# Patient Record
Sex: Female | Born: 1985
Health system: Southern US, Community
[De-identification: ages and names within clinical notes are randomized; demographics above are authoritative.]

## PROBLEM LIST (undated history)

## (undated) ENCOUNTER — Ambulatory Visit (HOSPITAL_COMMUNITY): Admission: EM | Payer: Self-pay | Source: Home / Self Care

## (undated) DIAGNOSIS — F32A Depression, unspecified: Secondary | ICD-10-CM

## (undated) DIAGNOSIS — F329 Major depressive disorder, single episode, unspecified: Secondary | ICD-10-CM

## (undated) HISTORY — DX: Depression, unspecified: F32.A

## (undated) HISTORY — DX: Major depressive disorder, single episode, unspecified: F32.9

## (undated) HISTORY — PX: WISDOM TOOTH EXTRACTION: SHX21

---
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
## 2005-04-02 ENCOUNTER — Emergency Department (HOSPITAL_COMMUNITY): Admission: EM | Admit: 2005-04-02 | Discharge: 2005-04-02 | Payer: Self-pay | Admitting: Emergency Medicine

## 2007-02-08 ENCOUNTER — Emergency Department (HOSPITAL_COMMUNITY): Admission: EM | Admit: 2007-02-08 | Discharge: 2007-02-08 | Payer: Self-pay | Admitting: Emergency Medicine

## 2008-01-09 ENCOUNTER — Encounter: Admission: RE | Admit: 2008-01-09 | Discharge: 2008-01-09 | Payer: Self-pay | Admitting: Obstetrics and Gynecology

## 2008-07-11 ENCOUNTER — Inpatient Hospital Stay (HOSPITAL_COMMUNITY): Admission: AD | Admit: 2008-07-11 | Discharge: 2008-07-12 | Payer: Self-pay | Admitting: Obstetrics and Gynecology

## 2008-07-13 ENCOUNTER — Encounter: Admission: RE | Admit: 2008-07-13 | Discharge: 2008-07-13 | Payer: Self-pay | Admitting: Obstetrics and Gynecology

## 2008-07-18 ENCOUNTER — Inpatient Hospital Stay (HOSPITAL_COMMUNITY): Admission: AD | Admit: 2008-07-18 | Discharge: 2008-07-18 | Payer: Self-pay | Admitting: Obstetrics and Gynecology

## 2008-08-10 ENCOUNTER — Inpatient Hospital Stay (HOSPITAL_COMMUNITY): Admission: RE | Admit: 2008-08-10 | Discharge: 2008-08-12 | Payer: Self-pay | Admitting: Obstetrics and Gynecology

## 2009-07-23 ENCOUNTER — Emergency Department (HOSPITAL_COMMUNITY): Admission: EM | Admit: 2009-07-23 | Discharge: 2009-07-23 | Payer: Self-pay | Admitting: Emergency Medicine

## 2009-09-05 ENCOUNTER — Emergency Department (HOSPITAL_COMMUNITY): Admission: EM | Admit: 2009-09-05 | Discharge: 2009-09-05 | Payer: Self-pay | Admitting: Emergency Medicine

## 2009-09-07 ENCOUNTER — Ambulatory Visit: Payer: Self-pay | Admitting: Family Medicine

## 2009-09-07 ENCOUNTER — Inpatient Hospital Stay (HOSPITAL_COMMUNITY): Admission: EM | Admit: 2009-09-07 | Discharge: 2009-09-09 | Payer: Self-pay | Admitting: Emergency Medicine

## 2009-09-14 ENCOUNTER — Ambulatory Visit: Payer: Self-pay | Admitting: Family Medicine

## 2009-09-14 DIAGNOSIS — E1065 Type 1 diabetes mellitus with hyperglycemia: Secondary | ICD-10-CM | POA: Insufficient documentation

## 2009-09-14 DIAGNOSIS — IMO0002 Reserved for concepts with insufficient information to code with codable children: Secondary | ICD-10-CM | POA: Insufficient documentation

## 2009-09-19 ENCOUNTER — Encounter: Payer: Self-pay | Admitting: Family Medicine

## 2009-10-04 ENCOUNTER — Ambulatory Visit: Payer: Self-pay | Admitting: Family Medicine

## 2010-03-28 NOTE — Assessment & Plan Note (Signed)
Summary: see message/ts  called pt and lmvm to call back. pt needs appt with j.sykes and not w/dr.Brylee Berk (unless pt has problems today) Arlyss Repress CMA,  September 19, 2009 11:44 AM advised pt to see  dr.sykes and f/up with dr.Yuritza Paulhus in 2-4 weeks. pt agreed. we will cancel the appt for today.Arlyss Repress CMA,  September 19, 2009 1:38 PM

## 2010-03-28 NOTE — Assessment & Plan Note (Signed)
Summary: Seeing Sykes @ 10:30 / JCS   Vital Signs:  Patient profile:   25 year old female Height:      63 inches Weight:      114.3 pounds BMI:     20.32  Vitals Entered By: Wyona Almas PHD (October 04, 2009 10:31 AM)  Primary Care Provider:  Helane Rima DO   History of Present Illness: Assessment:  Spent 60 minutes with pt.  Usual eating pattern includes 3 meals and 1-2 snacks.  She has a 1- and 7-YO at home, is a Physicist, medical Probation officer), and works at [employer] on weekends,  ~14 hr.  Everyday foods/beverages include Crystal Light, coffee, eggs & bacon/sausage, toast w/ sugar-free jelly, lunch deli meat sandwich.  FBG has been running 100-120; both pre-lunch & pre-dinner have been 80-100.  24-hr recall suggests intake of 1300 kcal: B (9 AM)- 2 scrmbld eggs w/ 1 slc Amer cheese, 2 pc wht white toast w/ sug-free jelly, coffee w/ creamer & 4 SplendasL (12 PM)- [place] & pepper jack chs sandwich w/  ~1 tsp mayo, Crystal Light; Snk (3 PM)- 100-kcal Keebler choc pretzel (16 g CHO), 1/2 caramel nut bar (8 g CHO); D (7 PM)- 1/2 chx (3-4 oz) enchilada w/ sourcrm, cheese, tortilla, Cr Light.  Keniyah has had 2 significant hypoglycemic events early after dx, but has had none in the past couple of weeks.  She currently taking 17 U of Lantus in the morning only.  Karsynn stays busy, but does not do any structured exercise, other than occasional walks at night with her mother-in-law.  Although Letetia said she ate more liberally last week while on vacation, I think she has been undereating b/c of fear of running her BG up.    Nutrition Diagnosis:  Inadequate energy intake (NI-1.4) related to Dm anxiety as evidenced by intake of only 1300 kcal yesterday.  Physical inactivity (NB-2.1) related to time constraints as evidenced by no regular exercise.  Inadequate fruits and veg's.    Intervention:  See Patient Instructions.    Monitoring/Eval:  Dietary intake, body weight, and exercise in  Dr. Philis Pique nutr clinic in Sept.     Allergies: No Known Drug Allergies   Complete Medication List: 1)  Lantus Solostar 100 Unit/ml Soln (Insulin glargine) .... Use as directed 2)  Novolog 100 Unit/ml Soln (Insulin aspart) .... Use as directed. 3)  Mirena 20 Mcg/24hr Iud (Levonorgestrel)  Other Orders: Inital Assessment Each - [facility] [phone])  Patient Instructions: 1)  Nov 4th is Diabetes Day at [facility], sponsored by [employer].   2)  Look into classes at [facility]:  [phone]. 3)  TASTE PREFERENCES ARE LEARNED.   4)  PLANNING AHEAD WILL BE THE DIFFERENCE BETWEEN EATING WELL OR NOT:  Make a list of 5-7 meals that are nutritious, taste good, and are relatively quick & easy.  Use this as a basis for shopping, and keep it readily accessible.   5)  Incorporate more vegetables to lunch and dinner.  Nonstarchy veg's include all except for corn, potatoes, peas, dried beans and peas.  Sweet potatoes are starch foods, but do not raise BG as much as white potatoes.   6)  Limit carbohydrate to 48-50 grams per meal and 15-30 for snacks.   7)  Fiber does not count toward your carb's.   8)  Snacks:  Try to include a source of protein as well.   9)  Fruit will  be fine in normal portion sizes.  You may want to check your BG response to some fruits, i.e., oranges, pineapple, banana, watermelon.   10)  Please schedule an appt in Dr. Philis Pique Thursday afternoon Nutrition Clinic in Sept.   11)  If you have Qs, call Dr. Gerilyn Pilgrim:  [phone].

## 2010-03-28 NOTE — Assessment & Plan Note (Signed)
Summary: hfu/eo   Vital Signs:  Patient profile:   25 year old female Height:      63 inches Weight:      115 pounds BMI:     20.44 Temp:     98.4 degrees F oral Pulse rate:   84 / minute BP sitting:   101 / 73  (right arm) Cuff size:   regular  Vitals Entered By: Jimmy Footman, CMA (September 14, 2009 10:49 AM)  CC: F/U hospital for type I diabetes Is Patient Diabetic? Yes Did you bring your meter with you today? No Pain Assessment Patient in pain? no        Primary Care Provider:  Helane Rima DO  CC:  F/U hospital for type I diabetes.  History of Present Illness: 25 yo F with new onset DM, insulin dependent. Here for hospital follow-up.   1. DM: Rx Lantus (up to 14 units in the pm), Novolog SSI. Average am BG 150s-160s, average mealtime coverage 3-4 units. Highest BG = 246. Lowest = 91 (shaking at that time). Eye exam < 1 year ago. A1c 10.5. Exercises regularly. States that she is hungry "all the time." Denies HA, dizziness, vision changes, CP, SOB, N/V/D, numbness/tingling in hands/feet.  Habits & Providers  Alcohol-Tobacco-Diet     Tobacco Status: current  Current Medications (verified): 1)  Lantus Solostar 100 Unit/ml  Soln (Insulin Glargine) .... Use As Directed 2)  Novolog 100 Unit/ml Soln (Insulin Aspart) .... Use As Directed. 3)  Mirena 20 Mcg/24hr Iud (Levonorgestrel)  Allergies (verified): No Known Drug Allergies  Past History:  Past Medical History: DM, Insulin-Req Migraines  Past Surgical History: Caesarean section  Family History: Both grandfathers have diabetes type 2.  A grandmother with kidney disease, otherwise negative.   Social History: She is a smoker.  No drug use.  Occasional alcohol useSmoking Status:  current  Review of Systems General:  Denies chills and fever. CV:  Denies chest pain or discomfort, palpitations, and shortness of breath with exertion. GI:  Denies change in bowel habits, nausea, and vomiting. Endo:  Complains of  excessive hunger; denies excessive thirst and excessive urination.  Physical Exam  General:  Well-developed, well-nourished ,in no acute distress; alert, appropriate and cooperative throughout examination. Vitals reviewed. Lungs:  Normal respiratory effort, chest expands symmetrically. Lungs are clear to auscultation, no crackles or wheezes. Heart:  Normal rate and regular rhythm. S1 and S2 normal without gallop, murmur, click, rub or other extra sounds. Pulses:  2 + DP. Extremities:  No edema. Psych:  Oriented X3, memory intact for recent and remote, normally interactive, good eye contact, not anxious appearing, and not depressed appearing.     Impression & Recommendations:  Problem # 1:  DIABETES MELLITUS, TYPE I (ICD-250.01) Assessment New Will refer this patient to Endocrine since she is a good candidate for an insulin pump. Discussed self-titration of Lantus for am BG. Will send to Dr. Gerilyn Pilgrim for nutrition education. Her updated medication list for this problem includes:    Lantus Solostar 100 Unit/ml Soln (Insulin glargine) ..... Use as directed    Novolog 100 Unit/ml Soln (Insulin aspart) ..... Use as directed.  Orders: Endocrinology Referral (Endocrine)  Complete Medication List: 1)  Lantus Solostar 100 Unit/ml Soln (Insulin glargine) .... Use as directed 2)  Novolog 100 Unit/ml Soln (Insulin aspart) .... Use as directed. 3)  Mirena 20 Mcg/24hr Iud (Levonorgestrel)  Patient Instructions: 1)  It was very nice to meet you today! 2)  I am referring  you to our nutritionist and to an endocrinologist. 3)  Follow up in 2-4 weeks. 4)  Increase your Lantus 1 units each morning until your am blood sugar is less than 120. Prescriptions: LANTUS SOLOSTAR 100 UNIT/ML  SOLN (INSULIN GLARGINE) use as directed  #3 boxes x 3   Entered and Authorized by:   Helane Rima DO   Signed by:   Helane Rima DO on 09/21/2009   Method used:   Historical   RxID:   [id number]

## 2010-04-04 ENCOUNTER — Encounter: Payer: Self-pay | Admitting: *Deleted

## 2010-05-13 LAB — GLUCOSE, CAPILLARY

## 2010-05-14 LAB — POCT I-STAT, CHEM 8
Calcium, Ion: 1.06 mmol/L — ABNORMAL LOW (ref 1.12–1.32)
Creatinine, Ser: 0.6 mg/dL (ref 0.4–1.2)
Glucose, Bld: 345 mg/dL — ABNORMAL HIGH (ref 70–99)
HCT: 51 % — ABNORMAL HIGH (ref 36.0–46.0)
Hemoglobin: 17.3 g/dL — ABNORMAL HIGH (ref 12.0–15.0)
Sodium: 132 mEq/L — ABNORMAL LOW (ref 135–145)

## 2010-05-14 LAB — URINE MICROSCOPIC-ADD ON

## 2010-05-14 LAB — DIFFERENTIAL
Basophils Absolute: 0.1 10*3/uL (ref 0.0–0.1)
Eosinophils Absolute: 0.2 10*3/uL (ref 0.0–0.7)
Lymphocytes Relative: 24 % (ref 12–46)
Lymphs Abs: 3 10*3/uL (ref 0.7–4.0)
Monocytes Absolute: 0.6 10*3/uL (ref 0.1–1.0)
Monocytes Relative: 5 % (ref 3–12)
Neutrophils Relative %: 69 % (ref 43–77)

## 2010-05-14 LAB — CBC
HCT: 47.8 % — ABNORMAL HIGH (ref 36.0–46.0)
MCH: 30.7 pg (ref 26.0–34.0)
MCV: 89.3 fL (ref 78.0–100.0)
RDW: 12.4 % (ref 11.5–15.5)
WBC: 12.8 10*3/uL — ABNORMAL HIGH (ref 4.0–10.5)

## 2010-05-14 LAB — BASIC METABOLIC PANEL
BUN: 5 mg/dL — ABNORMAL LOW (ref 6–23)
Creatinine, Ser: 0.53 mg/dL (ref 0.4–1.2)
GFR calc Af Amer: >60 mL/min (ref >60)
Potassium: 3.7 mEq/L (ref 3.5–5.1)
Sodium: 137 mEq/L (ref 135–145)

## 2010-05-14 LAB — POCT I-STAT 3, VENOUS BLOOD GAS (G3P V)
Bicarbonate: 21.9 mEq/L (ref 20.0–24.0)
TCO2: 23 mmol/L (ref 0–100)
pH, Ven: 7.405 — ABNORMAL HIGH (ref 7.250–7.300)
pO2, Ven: 17 mmHg — CL (ref 30.0–45.0)

## 2010-05-14 LAB — GLUCOSE, CAPILLARY
Glucose-Capillary: 184 mg/dL — ABNORMAL HIGH (ref 70–99)
Glucose-Capillary: 201 mg/dL — ABNORMAL HIGH (ref 70–99)
Glucose-Capillary: 257 mg/dL — ABNORMAL HIGH (ref 70–99)
Glucose-Capillary: 260 mg/dL — ABNORMAL HIGH (ref 70–99)
Glucose-Capillary: 260 mg/dL — ABNORMAL HIGH (ref 70–99)
Glucose-Capillary: 297 mg/dL — ABNORMAL HIGH (ref 70–99)
Glucose-Capillary: 400 mg/dL — ABNORMAL HIGH (ref 70–99)
Glucose-Capillary: 415 mg/dL — ABNORMAL HIGH (ref 70–99)

## 2010-05-14 LAB — GLIADIN ANTIBODIES, SERUM
Gliadin IgA: 2.7 U/mL (ref <20)
Gliadin IgG: 2.6 U/mL (ref <20)

## 2010-05-14 LAB — URINALYSIS, ROUTINE W REFLEX MICROSCOPIC
Ketones, ur: >80 mg/dL — AB
Leukocytes, UA: NEGATIVE
Urobilinogen, UA: 0.2 mg/dL (ref 0.0–1.0)

## 2010-05-14 LAB — COMPREHENSIVE METABOLIC PANEL
Albumin: 5 g/dL (ref 3.5–5.2)
Chloride: 94 mEq/L — ABNORMAL LOW (ref 96–112)
Creatinine, Ser: 0.71 mg/dL (ref 0.4–1.2)
GFR calc Af Amer: >60 mL/min (ref >60)
GFR calc non Af Amer: >60 mL/min (ref >60)
Total Bilirubin: 1 mg/dL (ref 0.3–1.2)

## 2010-05-14 LAB — POCT PREGNANCY, URINE: Preg Test, Ur: NEGATIVE

## 2010-05-14 LAB — KETONES, QUALITATIVE

## 2010-06-05 LAB — CBC
HCT: 33.8 % — ABNORMAL LOW (ref 36.0–46.0)
MCHC: 35.2 g/dL (ref 30.0–36.0)
MCHC: 35.1 g/dL (ref 30.0–36.0)
Platelets: 288 10*3/uL (ref 150–400)
Platelets: 303 10*3/uL (ref 150–400)
RDW: 13.4 % (ref 11.5–15.5)
RDW: 13.6 % (ref 11.5–15.5)

## 2010-06-05 LAB — TYPE AND SCREEN: Antibody Screen: NEGATIVE

## 2010-06-05 LAB — ABO/RH: ABO/RH(D): O POS

## 2010-07-05 ENCOUNTER — Emergency Department (HOSPITAL_COMMUNITY)
Admission: EM | Admit: 2010-07-05 | Discharge: 2010-07-06 | Disposition: A | Discharge status: Home / Self Care | Payer: Medicaid Other | Attending: Emergency Medicine | Admitting: Emergency Medicine

## 2010-07-05 ENCOUNTER — Emergency Department (HOSPITAL_COMMUNITY): Payer: Medicaid Other

## 2010-07-05 DIAGNOSIS — E119 Type 2 diabetes mellitus without complications: Secondary | ICD-10-CM | POA: Insufficient documentation

## 2010-07-05 DIAGNOSIS — R109 Unspecified abdominal pain: Secondary | ICD-10-CM | POA: Insufficient documentation

## 2010-07-05 DIAGNOSIS — R10815 Periumbilic abdominal tenderness: Secondary | ICD-10-CM | POA: Insufficient documentation

## 2010-07-05 DIAGNOSIS — Z794 Long term (current) use of insulin: Secondary | ICD-10-CM | POA: Insufficient documentation

## 2010-07-05 DIAGNOSIS — R112 Nausea with vomiting, unspecified: Secondary | ICD-10-CM | POA: Insufficient documentation

## 2010-07-05 LAB — BASIC METABOLIC PANEL
BUN: 10 mg/dL (ref 6–23)
Calcium: 9.2 mg/dL (ref 8.4–10.5)
Chloride: 103 mEq/L (ref 96–112)
Creatinine, Ser: 0.51 mg/dL (ref 0.4–1.2)
GFR calc Af Amer: >60 mL/min (ref >60)
GFR calc non Af Amer: >60 mL/min (ref >60)

## 2010-07-05 LAB — POCT PREGNANCY, URINE: Preg Test, Ur: NEGATIVE

## 2010-07-05 LAB — URINALYSIS, ROUTINE W REFLEX MICROSCOPIC
Bilirubin Urine: NEGATIVE
Ketones, ur: NEGATIVE mg/dL
Nitrite: NEGATIVE
Protein, ur: NEGATIVE mg/dL
Urobilinogen, UA: 0.2 mg/dL (ref 0.0–1.0)
pH: 6 (ref 5.0–8.0)

## 2010-07-05 LAB — WET PREP, GENITAL
Clue Cells Wet Prep HPF POC: NONE SEEN
Trich, Wet Prep: NONE SEEN
Yeast Wet Prep HPF POC: NONE SEEN

## 2010-07-05 LAB — CBC
MCH: 30.7 pg (ref 26.0–34.0)
MCV: 86.7 fL (ref 78.0–100.0)
Platelets: 263 10*3/uL (ref 150–400)
RBC: 4.6 MIL/uL (ref 3.87–5.11)
RDW: 12.4 % (ref 11.5–15.5)

## 2010-07-05 LAB — DIFFERENTIAL
Basophils Relative: 0 % (ref 0–1)
Eosinophils Absolute: 0.3 10*3/uL (ref 0.0–0.7)
Eosinophils Relative: 2 % (ref 0–5)
Monocytes Relative: 6 % (ref 3–12)
Neutrophils Relative %: 79 % — ABNORMAL HIGH (ref 43–77)

## 2010-07-05 LAB — URINE MICROSCOPIC-ADD ON

## 2010-07-05 LAB — LIPASE, BLOOD: Lipase: 26 U/L (ref 11–59)

## 2010-07-06 ENCOUNTER — Emergency Department (HOSPITAL_COMMUNITY): Payer: Medicaid Other

## 2010-07-06 LAB — GC/CHLAMYDIA PROBE AMP, GENITAL
Chlamydia, DNA Probe: NEGATIVE
GC Probe Amp, Genital: NEGATIVE

## 2010-07-06 MED ORDER — IOHEXOL 300 MG/ML  SOLN
100.0000 mL | Freq: Once | INTRAMUSCULAR | Status: AC | PRN
  Administered 2010-07-06: 100 mL via INTRAVENOUS

## 2010-07-11 NOTE — Op Note (Signed)
Sierra Schneider, Sierra Schneider              ACCOUNT NO.:  [id number]   MEDICAL RECORD NO.:  [id number]          PATIENT TYPE:  INP   LOCATION:  9127                          FACILITY:  [facility]   PHYSICIAN:  Zelphia Cairo, MD    DATE OF BIRTH:  21-Sep-1985   DATE OF PROCEDURE:  08/10/2008  DATE OF DISCHARGE:                               OPERATIVE REPORT   PREOPERATIVE DIAGNOSES:  1. Intrauterine pregnancy at term.  2. Suspected macrosomia.  3. Post date.  4. Desires primary elective cesarean section.   PROCEDURE:  Primary low transverse cesarean delivery.   SURGEON:  Zelphia Cairo, MD   ANESTHESIA:  Spinal.   FINDINGS:  Viable infant with Apgars of 5 and 9, weight 9 pounds 3  ounces, pH 7.31, normal-appearing pelvic anatomy.   SPECIMENS:  Placenta.   COMPLICATIONS:  None.   CONDITION:  Stable to recovery room.   PROCEDURE:  Sierra Schneider was taken to the operating room where spinal  anesthesia was found to be adequate.  She was placed in the supine  position with a left tilt.  She was prepped and draped in sterile  fashion and a Foley catheter was inserted sterilely.  Pfannenstiel skin  incision was made with a scalpel and carried down to the underlying  fascia.  The fascia was incised in the midline.  This was extended  laterally using Mayo scissors.  Kocher clamps were used to grasp the  superior and inferior portion of the fascia.  The underlying rectus  muscles were dissected off using the Bovie.  The peritoneum was then  identified and entered sharply.  This was extended superiorly and  inferiorly with good visualization of the bladder.  The bladder blade  was then inserted.  The vesicouterine peritoneum was dissected off the  lower uterine segment using Metzenbaum scissors and blunt dissection.  The bladder blade was reinserted to protect the bladder flap.   Uterine incision was made with the scalpel and extended bluntly using my  fingers.  The fetal vertex was brought to the  uterine incision and  delivered with fundal pressure.  The mouth and nose were suctioned.  Thus, the cord was clamped and cut, and the infant was taken to the  awaiting pediatric staff.  The placenta was then manually removed from  the uterus.  The uterus was cleared of all clots and debris using a dry  lap sponge.  Uterine incision was reapproximated in a double layer  closure using 0 chromic in a running locked fashion.  Once hemostasis  was assured, the pelvis was then copiously irrigated with warm normal  saline.  The uterine incision was reinspected and  again found to be hemostatic.  The peritoneum was reapproximated using 0  Monocryl.  The fascia was closed with a looped 0 PDS and the skin was  closed with staples.  Sponge, lap, needle, and instrument counts were  correct x2.  She was taken to the recovery room in stable condition.      Zelphia Cairo, MD  Electronically Signed     GA/MEDQ  D:  08/10/2008  T:  08/11/2008  Job:  [id number]

## 2010-07-14 NOTE — Discharge Summary (Signed)
NAMEWINDI, Sierra Schneider              ACCOUNT NO.:  [id number]   MEDICAL RECORD NO.:  [id number]          PATIENT TYPE:  INP   LOCATION:  9127                          FACILITY:  [facility]   PHYSICIAN:  Duke Salvia. Marcelle Overlie, M.D.DATE OF BIRTH:  Mar 23, 1985   DATE OF ADMISSION:  08/10/2008  DATE OF DISCHARGE:  08/12/2008                               DISCHARGE SUMMARY   ADMITTING DIAGNOSES:  1. Intrauterine pregnancy at 46 and 5/7th weeks' estimated gestational      age.  2. Suspect macrosomia.  3. The patient desire for primary elective cesarean delivery.   DISCHARGE DIAGNOSES:  1. Status post low transverse cesarean section.  2. Viable female infant.   PROCEDURE:  Primary low transverse cesarean section.   REASON FOR ADMISSION:  Please see written H and P.   HOSPITAL COURSE:  The patient is a 25 year old, gravida 2, para 1, that  was admitted to [facility] for scheduled cesarean  section.  The patient had known suspect macrosomia of the infant and  desired a primary cesarean delivery.  On the morning of this admission,  the patient was taken to the operating room, where a spinal anesthesia  was administered without difficulty.  A low transverse incision was made  with delivery of a viable female infant weighing 9 pounds 3 ounces with  Apgars of 5 at 1-minute and 9 at 5 minutes.  The patient the tolerated  procedure well and was taken to the recovery room in stable condition.  On postoperative day #1, the patient was without complaint.  Vital signs  were stable.  She was afebrile.  Abdomen was soft.  Abdominal dressings  noted be clean, dry, and intact.  On postoperative day #2, the patient  did desire early discharge.  Vital signs were stable.  She was afebrile.  Abdomen was soft.  Fundus was firm and nontender.  Incision was clean,  dry, and intact.  Discharge instructions reviewed and the patient was  later discharged home.   CONDITION ON DISCHARGE:  Stable.   DIET:   Regular as tolerated.   ACTIVITY:  No heavy lifting, no driving x2 weeks, and no vaginal entry.   FOLLOWUP:  The patient is to follow up in the office in 2-3 days for  staple removal.  She is to call for temperature greater than 100  degrees, persistent nausea, vomiting, heavy vaginal bleeding and/or  redness, or drainage from the incisional site.   DISCHARGE MEDICATIONS:  1. Tylox #30 one p.o. q.4-6 h. p.r.n.  2. Motrin 600 mg q.6 h.  3. Prenatal vitamins 1 p.o. daily.  4. Colace 1 p.o. daily p.r.n.      Julio Sicks, N.P.      Richard M. Marcelle Overlie, M.D.  Electronically Signed    CC/MEDQ  D:  08/19/2008  T:  08/19/2008  Job:  [id number]

## 2010-08-04 ENCOUNTER — Telehealth: Payer: Self-pay | Admitting: *Deleted

## 2010-08-04 NOTE — Telephone Encounter (Signed)
Patient walked into clinic with c/o sinus pain and pressure.  When I pulled up her vists I saw that she has only been seen once in our clinic and that was for a HFU almost a year ago.  Advised her that we would not be able to see her and that she would need to go to an [facility].  Advised pt to schedule an appointment to re-establish care.  Pt agreeable.

## 2010-08-11 ENCOUNTER — Ambulatory Visit: Payer: Medicaid Other | Admitting: Family Medicine

## 2010-11-27 ENCOUNTER — Encounter: Payer: Self-pay | Admitting: Family Medicine

## 2010-11-27 ENCOUNTER — Ambulatory Visit (INDEPENDENT_AMBULATORY_CARE_PROVIDER_SITE_OTHER): Payer: Medicaid Other | Admitting: Family Medicine

## 2010-11-27 DIAGNOSIS — Z7189 Other specified counseling: Secondary | ICD-10-CM

## 2010-11-27 DIAGNOSIS — F32A Depression, unspecified: Secondary | ICD-10-CM

## 2010-11-27 DIAGNOSIS — E109 Type 1 diabetes mellitus without complications: Secondary | ICD-10-CM

## 2010-11-27 DIAGNOSIS — F3289 Other specified depressive episodes: Secondary | ICD-10-CM

## 2010-11-27 DIAGNOSIS — F329 Major depressive disorder, single episode, unspecified: Secondary | ICD-10-CM

## 2010-11-27 DIAGNOSIS — Z719 Counseling, unspecified: Secondary | ICD-10-CM

## 2010-11-27 MED ORDER — SERTRALINE HCL 25 MG PO TABS: 50.0000 mg | ORAL_TABLET | Freq: Every day | ORAL | Status: DC

## 2010-11-27 NOTE — Patient Instructions (Signed)
Follow up with Dr. Pascal Lux for therapy. (see her business card) Take zoloft daily- return in 2 weeks for follow up.

## 2010-12-01 ENCOUNTER — Encounter: Payer: Self-pay | Admitting: Family Medicine

## 2010-12-01 DIAGNOSIS — F329 Major depressive disorder, single episode, unspecified: Secondary | ICD-10-CM | POA: Insufficient documentation

## 2010-12-01 DIAGNOSIS — Z719 Counseling, unspecified: Secondary | ICD-10-CM | POA: Insufficient documentation

## 2010-12-01 DIAGNOSIS — F32A Depression, unspecified: Secondary | ICD-10-CM | POA: Insufficient documentation

## 2010-12-01 NOTE — Assessment & Plan Note (Signed)
Will start patient on Zoloft today. Patient return in 2 weeks for recheck and possible titration of medication. Patient to call Dr. Pascal Lux to arrange appointment. Patient interested in individual therapy as well as couples therapy for her and her fianc. Last Dr. Pascal Lux if she would be able to do the couples therapy as well or if this would need to be referred out to another provider.

## 2010-12-01 NOTE — Progress Notes (Signed)
  Subjective:    Patient ID: Sierra Schneider, female    DOB: [birth year], 25 y.o.   MRN: [id number]  HPI Diabetes: Patient is managed by Dr. Lucianne Muss, has a insulin pump, patient states that she uses carb counting  daily to dose insulin with pump. She works out 3 times per week.  Stress/depression: Patient states that she has a lot of stress. She goes to school full-time at the [employer], studying business. Work part-time at [employer]. States she is engaged as having relationship problems at this point. This is her biggest source of stress. She endorses feelings of loneliness and depression. States issues and I feel she is in a fog. Positive feelings of guilt. Little interest or pleasure in doing things. Decreased energy. Good appetite, able to concentrate, no increased energy, no flight of ideas, but grandiose ideation, no distractibility. No family history of bipolar. Has family history of depression in both mother and father. PHQ-9 score of 14. Patient requesting medication to help symptoms. Patient also interested in seeing therapist.  Review of Systems As per above    Objective:   Physical Exam  Constitutional: She is oriented to person, place, and time. She appears well-developed and well-nourished.  HENT:  Head: Normocephalic and atraumatic.  Cardiovascular: Normal rate, regular rhythm and normal heart sounds.   No murmur heard. Pulmonary/Chest: Effort normal and breath sounds normal. No respiratory distress. She has no wheezes.  Abdominal: Soft. She exhibits no distension. There is no tenderness.  Musculoskeletal: Normal range of motion. She exhibits no edema.  Neurological: She is alert and oriented to person, place, and time.  Skin: No rash noted.  Psychiatric: She has a normal mood and affect. Her behavior is normal.       Tearful during exam          Assessment & Plan:

## 2010-12-01 NOTE — Assessment & Plan Note (Signed)
At followup appointment will need to review health maintenance. Ensure the patient is up-to-date on Pap smear. And vaccinations. Also need to review past medical history, family history, social history-in more detail. Also need to ensure that we discuss birth control and next appointment

## 2010-12-01 NOTE — Assessment & Plan Note (Signed)
Will start patient on Zoloft today. Patient return in 2 weeks for recheck and possible titration of medication. Patient to call Dr. Kane to arrange appointment. Patient interested in individual therapy as well as couples therapy for her and her fianc. Last Dr. Kane if she would be able to do the couples therapy as well or if this would need to be referred out to another provider. 

## 2010-12-20 ENCOUNTER — Ambulatory Visit (INDEPENDENT_AMBULATORY_CARE_PROVIDER_SITE_OTHER): Payer: Medicaid Other | Admitting: Family Medicine

## 2010-12-20 DIAGNOSIS — F32A Depression, unspecified: Secondary | ICD-10-CM

## 2010-12-20 DIAGNOSIS — F329 Major depressive disorder, single episode, unspecified: Secondary | ICD-10-CM

## 2010-12-20 DIAGNOSIS — F3289 Other specified depressive episodes: Secondary | ICD-10-CM

## 2010-12-20 NOTE — Progress Notes (Signed)
Patient thought that she was scheduled with Dr. Edmonia James today.  She saw Dr. Edmonia James recently and developed a fantastic clinical rapport.  She would like to switch her care to Dr. Edmonia James, reschedule today's appointment.  This plan is acceptable to me.

## 2011-01-01 ENCOUNTER — Encounter: Payer: Self-pay | Admitting: Family Medicine

## 2011-01-01 ENCOUNTER — Ambulatory Visit (INDEPENDENT_AMBULATORY_CARE_PROVIDER_SITE_OTHER): Payer: Medicaid Other | Admitting: Family Medicine

## 2011-01-01 VITALS — BP 108/74 | HR 84 | Temp 98.3°F | Ht 63.75 in | Wt 115.3 lb

## 2011-01-01 DIAGNOSIS — F32A Depression, unspecified: Secondary | ICD-10-CM

## 2011-01-01 DIAGNOSIS — F3289 Other specified depressive episodes: Secondary | ICD-10-CM

## 2011-01-01 DIAGNOSIS — F329 Major depressive disorder, single episode, unspecified: Secondary | ICD-10-CM

## 2011-01-01 DIAGNOSIS — E109 Type 1 diabetes mellitus without complications: Secondary | ICD-10-CM

## 2011-01-01 LAB — CBC
Hemoglobin: 13.3 g/dL (ref 12.0–15.0)
MCH: 29.8 pg (ref 26.0–34.0)
MCV: 89.7 fL (ref 78.0–100.0)
Platelets: 331 10*3/uL (ref 150–400)
RBC: 4.46 MIL/uL (ref 3.87–5.11)
WBC: 13 10*3/uL — ABNORMAL HIGH (ref 4.0–10.5)

## 2011-01-01 MED ORDER — SERTRALINE HCL 25 MG PO TABS: 75.0000 mg | ORAL_TABLET | Freq: Every day | ORAL | Status: DC

## 2011-01-01 NOTE — Patient Instructions (Addendum)
Increase zoloft to 75mg  po daily.  Continue to follow up with therapist as planned.  Return in 1 month for follow up.

## 2011-01-01 NOTE — Progress Notes (Signed)
  Subjective:    Patient ID: Sierra Schneider, female    DOB: 1985/03/26, 26 y.o.   MRN: [id number]  HPI Depression followup: Patient states that she isn't taking Zoloft as directed. Did have some jitteriness the first 10 days, but then improved. Patient states that she feels less in a fog, can let things go off her back easier, feels less anxious, but continues to have fatigue and tiredness. Sleeps from 8:30 PM until 6 AM each night. No nighttime awakenings. Sometimes takes naps in the daytime. Continues to have stress and relationships. And feelings of anhedonia and lack of desire to do things around the home. Still able to function go to school and care for her children. Has seen a counselor at [facility]. Has seen her for couples therapy and is scheduled to go on November 7 for an individual therapy appointment. Relationship with her fiance still stressed. Patient does not have periods since started on IUD. Had history of heavy periods prior to IUD placement. No history of hypothyroidism. But does have type 1 diabetes. No symptoms of hypothyroidism except for extreme tiredness and hair loss. No constipation. No cold intolerance. No symptoms of hyperthyroidism.    Review of Systems As per above.    Objective:   Physical Exam  Constitutional: She is oriented to person, place, and time. She appears well-developed and well-nourished.  HENT:  Head: Normocephalic and atraumatic.  Eyes: Pupils are equal, round, and reactive to light.  Cardiovascular: Normal rate.   Pulmonary/Chest: Effort normal. No respiratory distress.  Musculoskeletal: Normal range of motion.       Normal ambulation.  Neurological: She is alert and oriented to person, place, and time.  Psychiatric: She has a normal mood and affect. Her behavior is normal.       PHQ 9 score of 6. No HI no SI.          Assessment & Plan:

## 2011-01-04 NOTE — Assessment & Plan Note (Signed)
Some improvement but still having some symptoms of depression. PHQ9 score now 6. Will titrate Zoloft up to 75 mg daily. Patient to followup with therapy and she has scheduled-with [facility]. Patient to return in one month for recheck. Since patient having symptoms of fatigue and tiredness. We'll also check TSH and CBC today.

## 2011-01-10 ENCOUNTER — Encounter: Payer: Self-pay | Admitting: Family Medicine

## 2011-01-11 ENCOUNTER — Telehealth: Payer: Self-pay | Admitting: Family Medicine

## 2011-01-11 IMAGING — CR DG CHEST 2V
2 series · 2 of 2 positions shown · non-contrast
Comparison: 04/02/2005

CLINICAL DATA: Shortness of breath.  The patient is 37 weeks
pregnant.

CHEST - 2 VIEW

[view not recorded (1 of 2)]
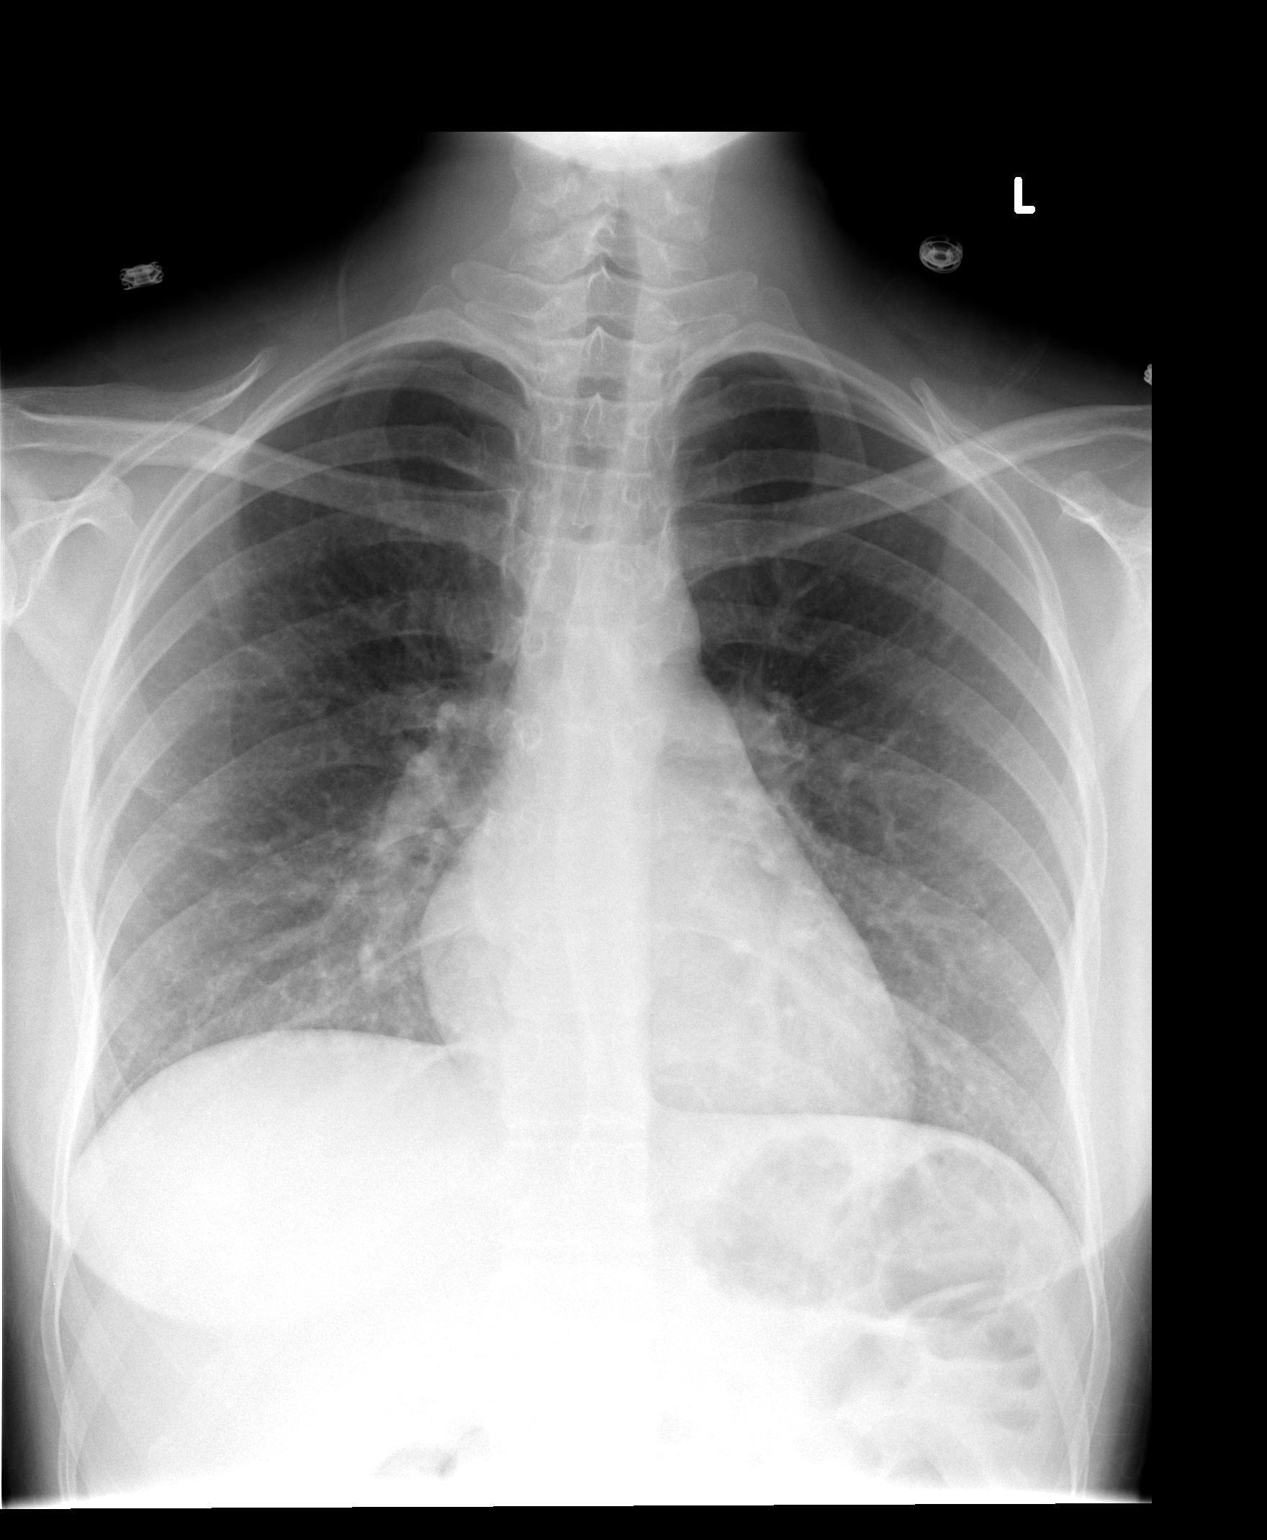

[view not recorded (2 of 2)]
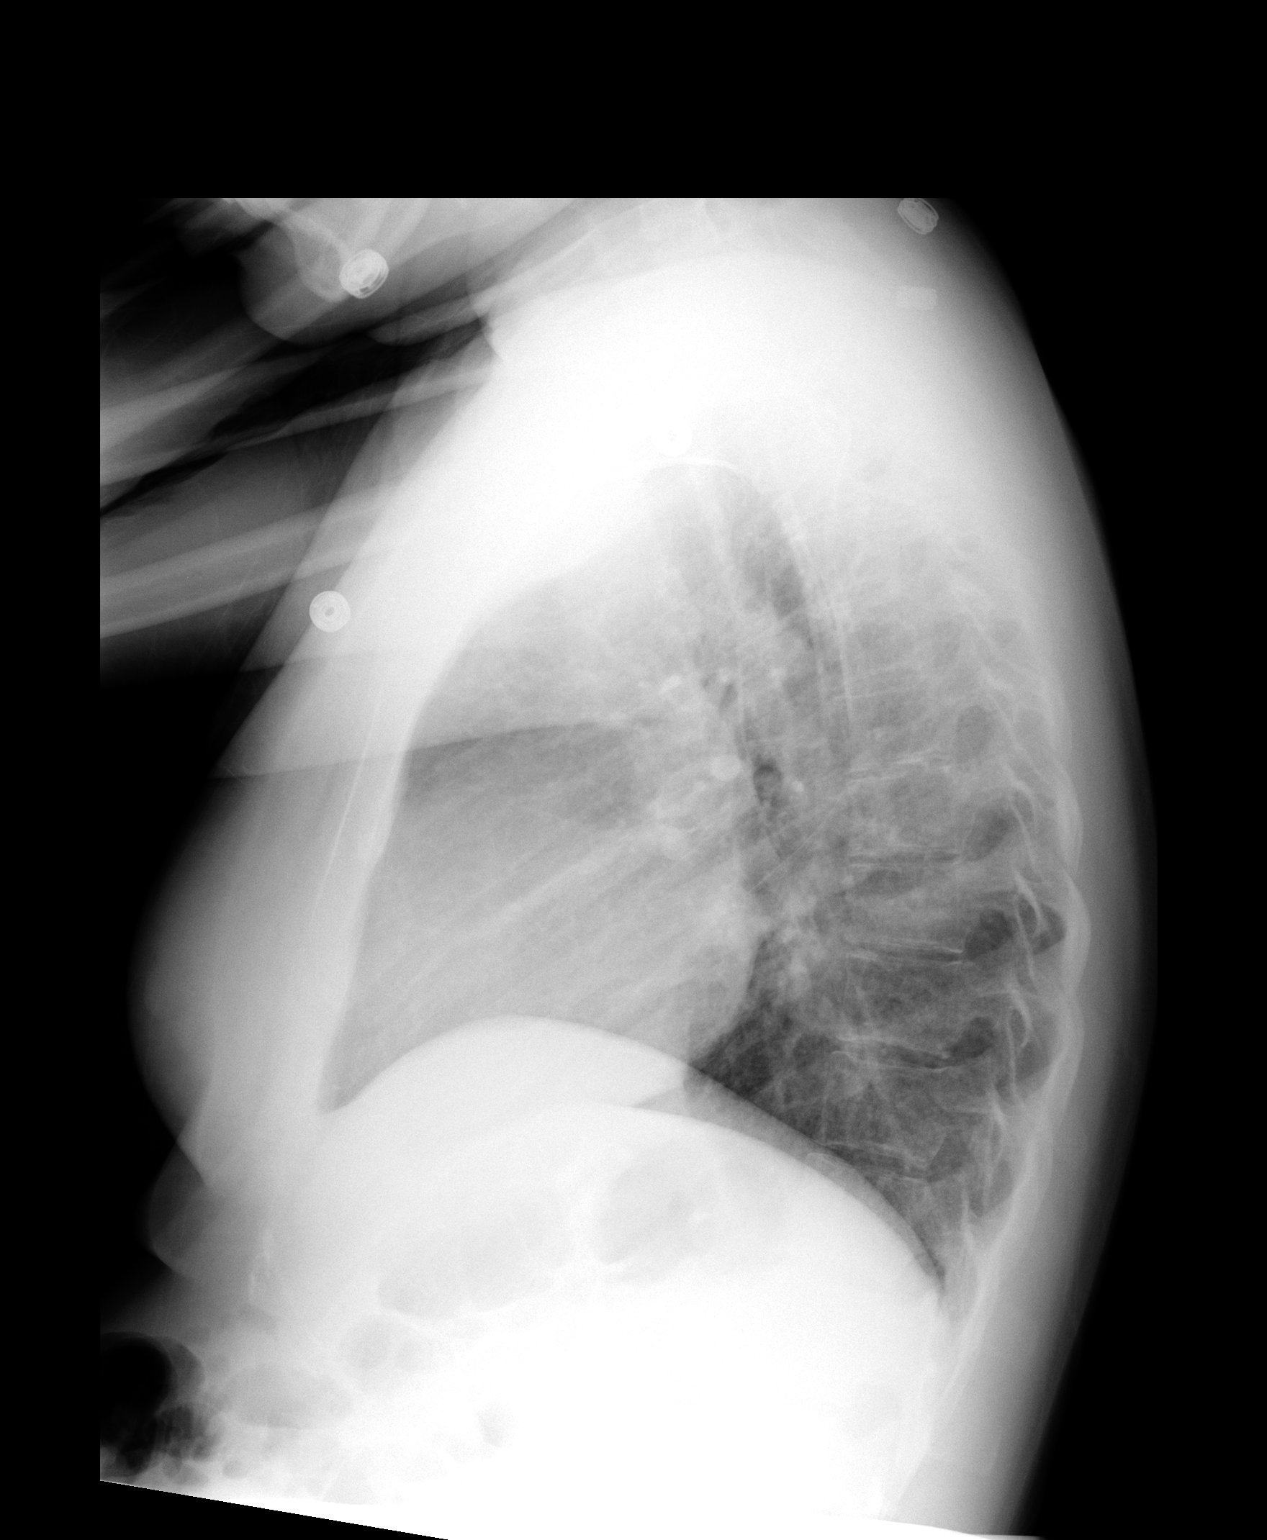

[2 of 2 positions shown; findings below may reference images not displayed]

FINDINGS: Mild bibasilar atelectasis present.  No focal infiltrate
or evidence of edema.  No pleural effusions.  The heart size is
normal.
IMPRESSION: Bibasilar atelectasis.  No acute findings.

## 2011-01-11 NOTE — Telephone Encounter (Signed)
Pt is needing a refill on her sertraline (ZOLOFT) 25 MG tablet Was told to call Dr Edmonia James since it is early and dose was increased. [facility] [address]

## 2011-01-14 ENCOUNTER — Other Ambulatory Visit: Payer: Self-pay | Admitting: Family Medicine

## 2011-01-14 MED ORDER — SERTRALINE HCL 50 MG PO TABS: 75.0000 mg | ORAL_TABLET | Freq: Every day | ORAL | Status: DC

## 2011-01-14 NOTE — Telephone Encounter (Signed)
Please let pt know that I have sent in RX -  Tell her that I have changed dosage of pill as we discussed I would do at her last appt.  Thanks, [employer]

## 2011-01-15 ENCOUNTER — Other Ambulatory Visit: Payer: Self-pay | Admitting: *Deleted

## 2011-01-15 ENCOUNTER — Telehealth: Payer: Self-pay | Admitting: Family Medicine

## 2011-01-15 DIAGNOSIS — F32A Depression, unspecified: Secondary | ICD-10-CM

## 2011-01-15 DIAGNOSIS — F329 Major depressive disorder, single episode, unspecified: Secondary | ICD-10-CM

## 2011-01-15 MED ORDER — SERTRALINE HCL 50 MG PO TABS: 75.0000 mg | ORAL_TABLET | Freq: Every day | ORAL | Status: DC

## 2011-01-15 NOTE — Telephone Encounter (Signed)
Refill request

## 2011-01-15 NOTE — Telephone Encounter (Signed)
[facility] [address] has not received the RX refill for Zoloft.  Ms. Nodal has been out since last Thursday.

## 2011-01-29 ENCOUNTER — Ambulatory Visit (INDEPENDENT_AMBULATORY_CARE_PROVIDER_SITE_OTHER): Payer: Medicaid Other | Admitting: Family Medicine

## 2011-01-29 ENCOUNTER — Encounter: Payer: Self-pay | Admitting: Family Medicine

## 2011-01-29 DIAGNOSIS — F32A Depression, unspecified: Secondary | ICD-10-CM

## 2011-01-29 DIAGNOSIS — F329 Major depressive disorder, single episode, unspecified: Secondary | ICD-10-CM

## 2011-01-29 DIAGNOSIS — E109 Type 1 diabetes mellitus without complications: Secondary | ICD-10-CM

## 2011-01-29 DIAGNOSIS — F3289 Other specified depressive episodes: Secondary | ICD-10-CM

## 2011-01-29 NOTE — Progress Notes (Signed)
  Subjective:    Patient ID: Sierra Schneider, female    DOB: 16-Feb-1986, 25 y.o.   MRN: [id number]  HPI followup depression: Patient states that she is much improved. Has much better energy.no depressed feelings. No decrease in her energy. No problems with appetite. Feel that the medication Zoloft is helping along with the couples therapy that she is receiving a [facility]. She states that on several days the past couple weeks she has had some problems falling asleep or staying asleep. And she also states that just several days in the past 2 weeks she has felt tired and had decreased energy. Denies SI. Denies HI.PHQ9 score of 2.   Review of Systems As per above.    Objective:   Physical Exam  Constitutional: She is oriented to person, place, and time. She appears well-developed and well-nourished.  HENT:  Head: Normocephalic and atraumatic.  Cardiovascular: Normal rate and regular rhythm.   No murmur heard. Pulmonary/Chest: Effort normal. No respiratory distress. She has no wheezes.  Neurological: She is alert and oriented to person, place, and time.  Psychiatric: She has a normal mood and affect. Her behavior is normal. Judgment and thought content normal.       PHQ9 score of 2. No HI. No SI.          Assessment & Plan:

## 2011-01-29 NOTE — Assessment & Plan Note (Signed)
PHQ-9 of 2. Pt feels great!patient to continue on Zoloft 75 mg daily. Patient to continue going to therapy appointments with [facility]. Patient return in 3 months for followup or sooner if new or worsening symptoms.

## 2011-02-28 ENCOUNTER — Emergency Department (HOSPITAL_COMMUNITY)
Admission: EM | Admit: 2011-02-28 | Discharge: 2011-03-01 | Disposition: A | Discharge status: Home / Self Care | Payer: Medicaid Other | Attending: Emergency Medicine | Admitting: Emergency Medicine

## 2011-02-28 ENCOUNTER — Encounter (HOSPITAL_COMMUNITY): Payer: Self-pay | Admitting: Emergency Medicine

## 2011-02-28 DIAGNOSIS — E109 Type 1 diabetes mellitus without complications: Secondary | ICD-10-CM | POA: Insufficient documentation

## 2011-02-28 DIAGNOSIS — R110 Nausea: Secondary | ICD-10-CM

## 2011-02-28 DIAGNOSIS — Z794 Long term (current) use of insulin: Secondary | ICD-10-CM | POA: Insufficient documentation

## 2011-02-28 DIAGNOSIS — R112 Nausea with vomiting, unspecified: Secondary | ICD-10-CM | POA: Insufficient documentation

## 2011-02-28 LAB — DIFFERENTIAL
Lymphocytes Relative: 40 % (ref 12–46)
Lymphs Abs: 4.3 10*3/uL — ABNORMAL HIGH (ref 0.7–4.0)
Monocytes Relative: 8 % (ref 3–12)
Neutro Abs: 5.3 10*3/uL (ref 1.7–7.7)
Neutrophils Relative %: 49 % (ref 43–77)

## 2011-02-28 LAB — GLUCOSE, CAPILLARY: Glucose-Capillary: 119 mg/dL — ABNORMAL HIGH (ref 70–99)

## 2011-02-28 LAB — URINALYSIS, ROUTINE W REFLEX MICROSCOPIC
Bilirubin Urine: NEGATIVE
Nitrite: NEGATIVE
Specific Gravity, Urine: 1.011 (ref 1.005–1.030)
Urobilinogen, UA: 0.2 mg/dL (ref 0.0–1.0)
pH: 8 (ref 5.0–8.0)

## 2011-02-28 LAB — BASIC METABOLIC PANEL
BUN: 11 mg/dL (ref 6–23)
Chloride: 106 mEq/L (ref 96–112)
GFR calc Af Amer: >90 mL/min (ref >90)
GFR calc non Af Amer: >90 mL/min (ref >90)
Glucose, Bld: 83 mg/dL (ref 70–99)
Potassium: 3.7 mEq/L (ref 3.5–5.1)
Sodium: 141 mEq/L (ref 135–145)

## 2011-02-28 LAB — CBC
Hemoglobin: 13.4 g/dL (ref 12.0–15.0)
Platelets: 280 10*3/uL (ref 150–400)
RBC: 4.4 MIL/uL (ref 3.87–5.11)
WBC: 10.8 10*3/uL — ABNORMAL HIGH (ref 4.0–10.5)

## 2011-02-28 LAB — POCT PREGNANCY, URINE: Preg Test, Ur: NEGATIVE

## 2011-02-28 LAB — URINE MICROSCOPIC-ADD ON

## 2011-02-28 MED ORDER — ONDANSETRON HCL 4 MG PO TABS: 8.0000 mg | ORAL_TABLET | Freq: Three times a day (TID) | ORAL | Status: AC | PRN

## 2011-02-28 MED ORDER — SODIUM CHLORIDE 0.9 % IV BOLUS (SEPSIS)
500.0000 mL | Freq: Once | INTRAVENOUS | Status: AC
  Administered 2011-02-28: 500 mL via INTRAVENOUS

## 2011-02-28 MED ORDER — ONDANSETRON HCL 4 MG/2ML IJ SOLN
4.0000 mg | Freq: Once | INTRAMUSCULAR | Status: AC
  Administered 2011-02-28: 4 mg via INTRAVENOUS
  Filled 2011-02-28: qty 2

## 2011-02-28 NOTE — ED Provider Notes (Addendum)
History     CSN: [id number]  Arrival date & time 02/28/11  1925   First MD Initiated Contact with Patient 02/28/11 2052      Chief Complaint  Patient presents with  . Emesis     HPI  PT. REPORTS NAUSEA AND VOMITTING ONSET THIS AFTERNOON ,SLIGHT ABDOMINAL CRAMPING , NO DIARRHEA , SLIGHT CHILLS , NO FEVER , NO DYSURIA OR VAGINAL DISCHARGE.    Past Medical History  Diagnosis Date  . Diabetes mellitus 08/2009    late onset type 1    Past Surgical History  Procedure Date  . Cesarean section     Family History  Problem Relation Age of Onset  . Depression Mother   . Depression Father   . Kidney disease Paternal Grandmother     History  Substance Use Topics  . Smoking status: Current Everyday Smoker -- 0.5 packs/day    Types: Cigarettes  . Smokeless tobacco: Not on file  . Alcohol Use: Yes     1 drink every 3 months    OB History    Grav Para Term Preterm Abortions TAB SAB Ect Mult Living                  Review of Systems  All other systems reviewed and are negative.    Allergies  Review of patient's allergies indicates no known allergies.  Home Medications   Current Outpatient Rx  Name Route Sig Dispense Refill  . INSULIN ASPART 100 UNIT/ML [place] SOLN Subcutaneous Inject into the skin continuous. Per insulin pump    . SERTRALINE HCL 50 MG PO TABS Oral Take 75 mg by mouth daily.      Marland Kitchen ONDANSETRON HCL 4 MG PO TABS Oral Take 2 tablets (8 mg total) by mouth every 8 (eight) hours as needed for nausea. 12 tablet 0    BP 103/67  Pulse 76  Temp(Src) 98.1 F (36.7 C) (Oral)  Resp 16  SpO2 100%  Physical Exam  Nursing note and vitals reviewed. Constitutional: She is oriented to person, place, and time. She appears well-developed and well-nourished. No distress.  HENT:  Head: Normocephalic and atraumatic.  Eyes: Pupils are equal, round, and reactive to light.  Neck: Normal range of motion.  Cardiovascular: Normal rate and intact distal pulses.     Pulmonary/Chest: No respiratory distress.  Abdominal: Soft. Normal appearance. She exhibits no distension.  Musculoskeletal: Normal range of motion.  Neurological: She is alert and oriented to person, place, and time. No cranial nerve deficit.  Skin: Skin is warm and dry. No rash noted.  Psychiatric: She has a normal mood and affect. Her behavior is normal.    ED Course  Procedures (including critical care time)  Labs Reviewed  CBC - Abnormal; Notable for the following:    WBC 10.8 (*)    All other components within normal limits  DIFFERENTIAL - Abnormal; Notable for the following:    Lymphs Abs 4.3 (*)    All other components within normal limits  URINALYSIS, ROUTINE W REFLEX MICROSCOPIC - Abnormal; Notable for the following:    APPearance HAZY (*)    Hgb urine dipstick TRACE (*)    All other components within normal limits  GLUCOSE, CAPILLARY - Abnormal; Notable for the following:    Glucose-Capillary 119 (*)    All other components within normal limits  URINE MICROSCOPIC-ADD ON - Abnormal; Notable for the following:    Squamous Epithelial / LPF FEW (*)    All other  components within normal limits  BASIC METABOLIC PANEL  POCT PREGNANCY, URINE  POCT PREGNANCY, URINE   No results found.   1. DIABETES MELLITUS, TYPE I   2. Nausea       MDM  After treatment in the ED the patient feels back to baseline and wants to go home.         Nelia Shi, MD 02/28/11 2313  Nelia Shi, MD 04/04/11 2026

## 2011-02-28 NOTE — ED Notes (Signed)
PT. REPORTS NAUSEA AND VOMITTING ONSET THIS AFTERNOON ,SLIGHT ABDOMINAL CRAMPING ,  NO DIARRHEA , SLIGHT CHILLS , NO FEVER , NO DYSURIA OR VAGINAL DISCHARGE.

## 2011-02-28 NOTE — ED Notes (Signed)
Glucose 119

## 2011-04-02 ENCOUNTER — Ambulatory Visit (INDEPENDENT_AMBULATORY_CARE_PROVIDER_SITE_OTHER): Payer: Medicaid Other | Admitting: Family Medicine

## 2011-04-02 ENCOUNTER — Encounter: Payer: Self-pay | Admitting: Family Medicine

## 2011-04-02 ENCOUNTER — Ambulatory Visit
Admission: RE | Admit: 2011-04-02 | Discharge: 2011-04-02 | Disposition: A | Discharge status: Home / Self Care | Payer: Medicaid Other | Source: Ambulatory Visit | Attending: Family Medicine | Admitting: Family Medicine

## 2011-04-02 VITALS — BP 107/69 | HR 73 | Ht 63.0 in | Wt 118.0 lb

## 2011-04-02 DIAGNOSIS — F17200 Nicotine dependence, unspecified, uncomplicated: Secondary | ICD-10-CM

## 2011-04-02 DIAGNOSIS — R928 Other abnormal and inconclusive findings on diagnostic imaging of breast: Secondary | ICD-10-CM

## 2011-04-02 DIAGNOSIS — N63 Unspecified lump in unspecified breast: Secondary | ICD-10-CM

## 2011-04-02 MED ORDER — VARENICLINE TARTRATE 0.5 MG PO TABS: 0.5000 mg | ORAL_TABLET | Freq: Two times a day (BID) | ORAL | Status: DC

## 2011-04-02 NOTE — Progress Notes (Signed)
  Subjective:    Patient ID: Sierra Schneider, female    DOB: 1985/08/18, 26 y.o.   MRN: [id number]  HPI Right breast nodule: Located in right upper outer breast area. Approximately pea-sized. Has been present x2 years. First noticed when pregnant. Had ultrasound at that time. Was told that most likely everything was normal but would need a followup ultrasound in 6 months. Patient did not go for followup ultrasound. She figured everything was okay. Patient now stating that the area seems a little bit larger. Also having some tenderness at the area of the nodule. No nipple discharge. No skin changes. No skin redness. No rash. No other nodules. Patient states overall her breast tissue is very irregular.  Left breast nodule: Located in area lateral to left nipple. Area also about pea sized. Noticed about one month ago. Some tenderness of this area. Left breast has been tender all over x1 month. No nipple discharge. No skin changes. No rashes. No other nodules. Again patient does have irregular breast tissue.  Smoking cessation: Patient states that she would like to quit smoking. Has quit smoking in the past using Chantix. Did have some dizziness with Chantix last time. But states that really her side effects are minimal and that she would like to try this again. Currently smoking half pack per day. Would like me to call in a prescription for Chantix. No cough. No cold symptoms. This was breath.   Review of Systems As per above.    Objective:   Physical Exam  Constitutional: She appears well-developed and well-nourished.  HENT:  Head: Normocephalic and atraumatic.  Cardiovascular: Normal rate.   Pulmonary/Chest: Effort normal. No respiratory distress. She has no wheezes. She has no rales.       Right breast: Small pea sized nodule in right upper outer quadrant of right breast.  At 10 o clock position. No redness. Minimal tenderness. No skin changes.  No rash. No fluctuance.   Left  breast: Small pea sized nodule present in area lateral to nipple.  At approx 3 o'clock position.  No redness. Minimal tenderness. No skin changes. No rash.  No fluctuance.   Neurological: She is alert.  Psychiatric: She has a normal mood and affect. Her behavior is normal.          Assessment & Plan:

## 2011-04-04 DIAGNOSIS — N63 Unspecified lump in unspecified breast: Secondary | ICD-10-CM | POA: Insufficient documentation

## 2011-04-04 DIAGNOSIS — Z716 Tobacco abuse counseling: Secondary | ICD-10-CM | POA: Insufficient documentation

## 2011-04-04 NOTE — Assessment & Plan Note (Addendum)
Right and left breast nodules/areas of concern.  Will send pt to ultrasound of these areas.  Most likely these are benign.

## 2011-04-04 NOTE — Assessment & Plan Note (Addendum)
Cnatix prescribed as per pt request.  Smoking cessation encouraged.

## 2011-06-06 ENCOUNTER — Other Ambulatory Visit: Payer: Self-pay | Admitting: Family Medicine

## 2011-06-07 ENCOUNTER — Other Ambulatory Visit: Payer: Self-pay | Admitting: Family Medicine

## 2011-06-07 MED ORDER — SERTRALINE HCL 50 MG PO TABS: 100.0000 mg | ORAL_TABLET | Freq: Every day | ORAL | Status: DC

## 2011-06-07 NOTE — Progress Notes (Signed)
Called pt to f/up regarding how she is doing with depression.  She states that she is doing well but would like to move forward with titrating up the dose a little more as we had discussed at previous visits.  Sent in rx for zoloft 100mg  daily.  Pt to return if any new or if any worsening of symptoms.

## 2011-06-13 ENCOUNTER — Ambulatory Visit (INDEPENDENT_AMBULATORY_CARE_PROVIDER_SITE_OTHER): Payer: Medicaid Other | Admitting: Family Medicine

## 2011-06-13 ENCOUNTER — Encounter: Payer: Self-pay | Admitting: Family Medicine

## 2011-06-13 ENCOUNTER — Ambulatory Visit: Payer: Medicaid Other | Admitting: Family Medicine

## 2011-06-13 VITALS — BP 104/71 | HR 89 | Temp 98.6°F | Ht 63.0 in | Wt 120.0 lb

## 2011-06-13 DIAGNOSIS — F17200 Nicotine dependence, unspecified, uncomplicated: Secondary | ICD-10-CM

## 2011-06-13 DIAGNOSIS — R05 Cough: Secondary | ICD-10-CM

## 2011-06-13 DIAGNOSIS — R059 Cough, unspecified: Secondary | ICD-10-CM | POA: Insufficient documentation

## 2011-06-13 MED ORDER — CETIRIZINE HCL 10 MG PO TABS: 10.0000 mg | ORAL_TABLET | Freq: Every day | ORAL | Status: DC

## 2011-06-13 MED ORDER — FLUTICASONE PROPIONATE 50 MCG/ACT NA SUSP
2.0000 | Freq: Every day | NASAL | Status: DC
Start: 2011-06-13 — End: 2012-02-03

## 2011-06-13 MED ORDER — GUAIFENESIN-CODEINE 100-10 MG/5ML PO SYRP: 5.0000 mL | ORAL_SOLUTION | Freq: Three times a day (TID) | ORAL | Status: AC | PRN

## 2011-06-13 MED ORDER — BUPROPION HCL ER (SR) 150 MG PO TB12: 150.0000 mg | ORAL_TABLET | Freq: Two times a day (BID) | ORAL | Status: DC

## 2011-06-13 NOTE — Progress Notes (Signed)
Sierra Schneider is a 26 y.o. female who presents to [facility] today for   1) cough: Present for 3 days.  Patient also notes itchy watery eyes.  Additionally she notes mild sharp bilateral lower rib pain with deep inspiration and coughing present for one day. She denies any central crushing chest pain or pain that worsens with exertion. Additionally she denies any leg swelling or trouble breathing. She is a smoker.  Her cough is mildly productive and it seems to be worsening.  2) smoking: Patient is a daily smoker less than one pack per day but more than half a pack a day.  She is very desirous of smoking cessation. She try Chantix in the past but it caused nausea.  She has been able to quit for 7 months at one point in her past but started smoking again. Her fianc also smokes, and seems to be a barrier to her successfully quitting smoking.    PMH, SH reviewed: Significant for type 1 diabetes diagnosed in 26 years old and smoking ROS as above otherwise neg. No Chest pain, palpitations, SOB, Fever, Chills, Abd pain, N/V/D.  Medications reviewed. Current Outpatient Prescriptions  Medication Sig Dispense Refill  . insulin aspart (NOVOLOG) 100 UNIT/ML injection Inject into the skin continuous. Per insulin pump      . sertraline (ZOLOFT) 50 MG tablet Take 2 tablets (100 mg total) by mouth daily.  90 tablet  4  . buPROPion (WELLBUTRIN SR) 150 MG 12 hr tablet Take 1 tablet (150 mg total) by mouth 2 (two) times daily.  60 tablet  1  . cetirizine (ZYRTEC) 10 MG tablet Take 1 tablet (10 mg total) by mouth daily.  30 tablet  11  . fluticasone (FLONASE) 50 MCG/ACT nasal spray Place 2 sprays into the nose daily.  16 g  6  . guaiFENesin-codeine (ROBITUSSIN AC) 100-10 MG/5ML syrup Take 5 mLs by mouth 3 (three) times daily as needed for cough.  120 mL  0    Exam:  BP 104/71  Pulse 89  Temp(Src) 98.6 F (37 C) (Oral)  Ht 5\' 3"  (1.6 m)  Wt 120 lb (54.432 kg)  BMI 21.26 kg/m2 Gen: Well NAD HEENT: EOMI,  MMM  tympanic membranes are retracted bilaterally without erythema. Posterior pharynx has cobblestoning. Lungs: CTABL Nl WOB Heart: RRR no MRG Abd: NABS, NT, ND Exts: Non edematous BL  LE, warm and well perfused.

## 2011-06-13 NOTE — Patient Instructions (Addendum)
Thank you for coming in today. Take the Zyrtec daily for allergies Use the Flonase for allergies Use the cough medicine at night as needed. Call or go to the emergency room if you get worse, have trouble breathing, have chest pains, or palpitations.  Let me know if you do not get better in 1-2 weeks.  For smoking: Pick a good day to quit smoking that is low stress. One week prior start taking the Wellbutrin (bupropion) .  For the first 3 days take one pill daily.  After the 3 days start taking the medicine twice a day. Quit smoking after one week. You may also use a 14 mg nicotine patch and/or nicotine gum. Please see Dr. Edmonia James around one week before during or after your quit day You can quit smoking if the best thing you can do for your own health. Your fianc must quit smoking at the same time.

## 2011-06-13 NOTE — Assessment & Plan Note (Signed)
I spent 10 minutes discussing smoking cessation options.  She is very desirous of smoking cessation but feels at a loss for how to successfully quit.  She failed Chantix therapy due to nausea.   Discussed several options. Patient expresses interest in Wellbutrin plus/minus nicotine replacement therapy with gum or patches.  Additionally discussed some behavioral mechanisms to successfully quit including throwing away ashtrays and cigarettes and insisting that her fianc also quits.  She expresses understanding in confidence. She will set a quit date sometime in the next few months and followup with Dr. Edmonia James right around her quit day.

## 2011-06-13 NOTE — Assessment & Plan Note (Signed)
Patient's cough is likely do to seasonal allergies and a posterior nasal drip.  I feel that serious chest pathology such as myocardial infarction or pulmonary embolism is very unlikely in this young healthy 26 year old woman without tachycardia tachypnea or calf swelling. Plan to treat with oral histamine blockers (Zyrtec) , nasal steroids (Flonase).  Additionally we'll treat cough symptomatically with guaifenesin/codeine cough syrup at night. Discuss red flag signs or symptoms such as worsening chest pain workup of breathing with patient who expresses understanding.Marland Kitchen

## 2011-06-20 ENCOUNTER — Encounter: Payer: Self-pay | Admitting: Family Medicine

## 2011-06-20 ENCOUNTER — Ambulatory Visit (INDEPENDENT_AMBULATORY_CARE_PROVIDER_SITE_OTHER): Payer: Medicaid Other | Admitting: Family Medicine

## 2011-06-20 VITALS — BP 109/68 | HR 85 | Temp 98.5°F | Ht 63.0 in | Wt 119.0 lb

## 2011-06-20 DIAGNOSIS — R05 Cough: Secondary | ICD-10-CM

## 2011-06-20 DIAGNOSIS — R059 Cough, unspecified: Secondary | ICD-10-CM

## 2011-06-20 MED ORDER — AZITHROMYCIN 250 MG PO TABS: ORAL_TABLET | ORAL | Status: AC

## 2011-06-20 NOTE — Progress Notes (Signed)
  Subjective:    Patient ID: Sierra Schneider, female    DOB: Dec 08, 1985, 26 y.o.   MRN: [id number]  HPI Patient presents today with complaint of worsening symptoms since last visit here on April 17th.  She is seen with medical student Elinor Parkinson, [facility] MS[facility].    Patient reports onset illness Monday, April 15th, presenting iniitally as nasal congestion and thick nasal discharge.  Within 2 days began with some cough.  Seen here by Dr Denyse Amass on 4/17, thought at that time to be most likely a flare of seasonal allergies and prescribed Zyrtec and Flonase.  The patient has been using these medications, and a Netty pot, regularly.  She has noted an improvement in her nasal congestion, and has been able to breathe through her nose.  Since office visit, she has been feeling more fatigued and "ill". Cough has picked up, sometimes dry and hacking, other times (daytime) with green nonbloody sputum production.  She has had to reduce the amount of cigarettes she smokes because of illness.  Feels chills and sweats, no measured fevers.  She is a Type I diabetic and uses insulin pump; usually her morning CBG is low 100s, but yesterday got as high as 390 without a good explanation.  Robitussin AC helps her to sleep at night, without which she would be up all night with cough.   Children ages 14 and 52 are well.     Review of Systems    See HPI Objective:   Physical Exam  Generally well appearing, no apparent distress.  HEENT Neck supple, no cervical adenopathy. Clear oropharynx. Maxillary sinus tenderness. Nasal mucosa with scant clear rhinorrhea. TMs clear.  COR S1S2, no extra sounds PULM Clear bilaterally, no rales or wheezes       Assessment & Plan:

## 2011-06-20 NOTE — Patient Instructions (Signed)
It was a pleasure to see you today.   I believe your worsening cough, malaise and spike in your glucose may indicate an infectious process such as bronchitis or even early walking pneumonia.  I sent in a precription for Azithromycin 250mg  tablets, take 2 tablets together today, then 1 tablet daily for 4 more days.   Keep using the netty pot, flonase, and please call if you develop fevers, shortness of breath, or if your sugars are continuing to go out of control (too high or too low).

## 2011-06-20 NOTE — Assessment & Plan Note (Signed)
Patient with around 10 days of respiratory illness, which has morphed into more of a cough/fatigue/malaise and less nasal/sinus.  Her persistent cough and recent bump in her home glucose readings raise concern for infectious illness, including atypical pneumonia or bronchitis.  Will treat with macrolide antibiotic, mucolytic, discussed red flags that should prompt follow up.  May continue with cough for several weeks, but should expect to feel better otherwise.

## 2011-11-08 ENCOUNTER — Ambulatory Visit (INDEPENDENT_AMBULATORY_CARE_PROVIDER_SITE_OTHER): Payer: Medicaid Other | Admitting: Family Medicine

## 2011-11-08 ENCOUNTER — Other Ambulatory Visit (HOSPITAL_COMMUNITY)
Admission: RE | Admit: 2011-11-08 | Discharge: 2011-11-08 | Disposition: A | Discharge status: Home / Self Care | Payer: Medicaid Other | Source: Ambulatory Visit | Attending: Family Medicine | Admitting: Family Medicine

## 2011-11-08 ENCOUNTER — Encounter: Payer: Self-pay | Admitting: Family Medicine

## 2011-11-08 VITALS — BP 101/66 | HR 78 | Temp 98.9°F | Ht 63.0 in | Wt 114.0 lb

## 2011-11-08 DIAGNOSIS — F329 Major depressive disorder, single episode, unspecified: Secondary | ICD-10-CM

## 2011-11-08 DIAGNOSIS — M542 Cervicalgia: Secondary | ICD-10-CM

## 2011-11-08 DIAGNOSIS — F3289 Other specified depressive episodes: Secondary | ICD-10-CM

## 2011-11-08 DIAGNOSIS — B9689 Other specified bacterial agents as the cause of diseases classified elsewhere: Secondary | ICD-10-CM

## 2011-11-08 DIAGNOSIS — N760 Acute vaginitis: Secondary | ICD-10-CM

## 2011-11-08 DIAGNOSIS — F32A Depression, unspecified: Secondary | ICD-10-CM

## 2011-11-08 DIAGNOSIS — Z113 Encounter for screening for infections with a predominantly sexual mode of transmission: Secondary | ICD-10-CM | POA: Insufficient documentation

## 2011-11-08 DIAGNOSIS — A499 Bacterial infection, unspecified: Secondary | ICD-10-CM

## 2011-11-08 LAB — POCT WET PREP (WET MOUNT): Clue Cells Wet Prep Whiff POC: POSITIVE

## 2011-11-08 MED ORDER — SERTRALINE HCL 50 MG PO TABS: 150.0000 mg | ORAL_TABLET | Freq: Every day | ORAL | Status: DC

## 2011-11-08 MED ORDER — METRONIDAZOLE 500 MG PO TABS: 500.0000 mg | ORAL_TABLET | Freq: Two times a day (BID) | ORAL | Status: DC

## 2011-11-08 NOTE — Patient Instructions (Addendum)
It was nice to meet you.  You can increase your zoloft to 150 mg daily, please plan on seeing your new doctor in about 1 month to make sure you are starting to feel better.   For your neck, please see the hand out with stretches and exercises- try to do these twice a day.  Also, you can try a heating pad, ibuprofen, or icy hot ointment.   Please take the antibiotic for the Bacterial vaginosis, but let us know if it does not improve.

## 2011-11-09 ENCOUNTER — Telehealth: Payer: Self-pay | Admitting: Family Medicine

## 2011-11-09 DIAGNOSIS — N760 Acute vaginitis: Secondary | ICD-10-CM | POA: Insufficient documentation

## 2011-11-09 DIAGNOSIS — M542 Cervicalgia: Secondary | ICD-10-CM | POA: Insufficient documentation

## 2011-11-09 DIAGNOSIS — B9689 Other specified bacterial agents as the cause of diseases classified elsewhere: Secondary | ICD-10-CM | POA: Insufficient documentation

## 2011-11-09 NOTE — Telephone Encounter (Signed)
Called to notify patient- GC & Chlamydia tests normal.  Symptoms just from BV, advised to finish metronidazole.

## 2011-11-09 NOTE — Assessment & Plan Note (Signed)
Gave hand out for neck stretches/exercises.  Advised NSAIDS and heating pad as well.

## 2011-11-09 NOTE — Assessment & Plan Note (Signed)
Will increase zoloft to 150 mg po daily.  Have recommended f/u with PCP in 1 month.

## 2011-11-09 NOTE — Assessment & Plan Note (Signed)
Rx for metronidazole sent to pharmacy.  F/U if not improving.

## 2011-11-09 NOTE — Progress Notes (Signed)
  Subjective:    Patient ID: Sierra Schneider, female    DOB: 1985/08/02, 26 y.o.   MRN: [id number]  HPI  Sierra Schneider comes in with several complaints  Vaginal discharge- started 4-5 days ago, there is an odor but no itching.  No new sexual contacts or concerns about STI's.  No abdominal pain, n/v.   Depression- has been feeling overwhealmed lately, and not enjoying things (like playing with her kids) that she normally enjoys.  Is taking zoloft 100 mg po daily, which has made a difference, but she is wondering if upping the dose would help her feel better. No SI/HI, +depressed mood, feelings of loneliness.   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  She does not like taking a muscle relaxer due to the sedation side effect. No numbness or tingling in arms/hands.  Past Medical History  Diagnosis Date  . Diabetes mellitus 08/2009    late onset type 1   Family History  Problem Relation Age of Onset  . Depression Mother   . Depression Father   . Kidney disease Paternal Grandmother    History  Substance Use Topics  . Smoking status: Current Every Day Smoker -- 0.5 packs/day    Types: Cigarettes  . Smokeless tobacco: Not on file  . Alcohol Use: Yes     1 drink every 3 months    Review of Systems See HPI    Objective:   Physical Exam BP 101/66  Pulse 78  Temp 98.9 F (37.2 C) (Oral)  Ht 5\' 3"  (1.6 m)  Wt 114 lb (51.71 kg)  BMI 20.19 kg/m2 General appearance: alert, cooperative and no distress Neck: supple, symmetrical, trachea midline, Patient with mild TTP in neck muscles, no tenderness over cervical spine, normal ROM, Spurling's negative.  Pelvic: cervix normal in appearance, external genitalia normal, no adnexal masses or tenderness, no cervical motion tenderness, rectovaginal septum normal, uterus normal size, shape, and consistency and vagina normal with thin odorous discharge.        Assessment & Plan:

## 2011-11-12 ENCOUNTER — Ambulatory Visit (INDEPENDENT_AMBULATORY_CARE_PROVIDER_SITE_OTHER): Payer: Medicaid Other | Admitting: Family Medicine

## 2011-11-12 VITALS — BP 106/59 | HR 98 | Temp 99.1°F | Ht 63.0 in | Wt 113.8 lb

## 2011-11-12 DIAGNOSIS — R102 Pelvic and perineal pain unspecified side: Secondary | ICD-10-CM | POA: Insufficient documentation

## 2011-11-12 DIAGNOSIS — N949 Unspecified condition associated with female genital organs and menstrual cycle: Secondary | ICD-10-CM

## 2011-11-12 LAB — POCT URINALYSIS DIPSTICK
Nitrite, UA: NEGATIVE
Protein, UA: NEGATIVE
Spec Grav, UA: >=1.03
Urobilinogen, UA: 0.2
pH, UA: 5.5

## 2011-11-12 LAB — POCT UA - MICROSCOPIC ONLY

## 2011-11-12 MED ORDER — SULFAMETHOXAZOLE-TRIMETHOPRIM 800-160 MG PO TABS: 1.0000 | ORAL_TABLET | Freq: Two times a day (BID) | ORAL | Status: DC

## 2011-11-12 NOTE — Assessment & Plan Note (Addendum)
Symptoms and exam most c/w folliculitis of labia majora. Some concern for possible HSV with malaise, but only one tiny lesion appears like a possible ulcer. Check HSV culture, hold on treatment with atypical appearance and more likely dx of folliculitis with recent shaving.  Contaminated UA showing possible sign of UTI, so will treat to cover both folliculitis and possible UTI in this diabetic patient with bactrim DS x 7 days. Send urine culture, also. F/u in one week or sooner if worsens.  Patient is monitoring CBGs closely with insulin pump and plans to see endocrinologist tomorrow.

## 2011-11-12 NOTE — Progress Notes (Signed)
  Subjective:    Patient ID: Sierra Schneider, female    DOB: [birth year], 26 y.o.   MRN: [id number]  HPI  1. Pelvic pain/itching. Patient began having itchy bumps in the vaginal area and bilateral groin pain 2 days ago. She shaved the area the day prior to onset of symptoms. Denies any history of similar episodes or STDs. Discomfort radiates to lower abdomen and she does have dysuria. She has malaise. She is a type I diabetic on insulin pump, and her blood sugars have been slightly elevated in the 200 range which is abnormal for her. Sexually active with one partner currently and has an IUD and does use condoms for protection. Last week diagnosed with bacterial vaginosis and is being treated with Flagyl currently.  She denies any nausea, vomiting, fever, chills, ulcers, mouth lesions, enlarge lymph nodes.  Review of Systems See HPI otherwise negative.  reports that she has been smoking Cigarettes.  She has been smoking about 1 pack per day. She does not have any smokeless tobacco history on file.     Objective:   Physical Exam  Vitals reviewed. Constitutional: She is oriented to person, place, and time. She appears well-developed and well-nourished. No distress.  HENT:  Head: Normocephalic and atraumatic.  Mouth/Throat: Oropharynx is clear and moist. No oropharyngeal exudate.  Neck: Neck supple.  Pulmonary/Chest: Effort normal.  Abdominal: Soft. She exhibits no distension and no mass. There is no tenderness. There is no rebound and no guarding.       No suprapubic or CVA tenderness.  Genitourinary: No vaginal discharge found.       Bilateral external labia majora with symmetric scattered erythematous papules ~2-5 mm with central hair follicles and shaven hairs. One lesion on right labia has some superficial erosion otherwise no ulcers. No inguinal LAD detected. No discharge, no mucosal lesions, no minora involvement.  Neurological: She is alert and oriented to person, place, and time.    Skin: She is not diaphoretic.       Assessment & Plan:

## 2011-11-12 NOTE — Patient Instructions (Addendum)
You seem to have a folliculitis. Will check for other infections and treat for folliculitis with bactrim. Please come back for check up in one week. You will be called with results in next 2-4 days. Avoid shaving as much as possible. Please call for any problems, worsening of symptoms, oozing, bleeding, fevers, back pain.  Folliculitis  Folliculitis is an infection and inflammation of the hair follicles. Hair follicles become red and irritated. This inflammation is usually caused by bacteria. The bacteria thrive in warm, moist environments. This condition can be seen anywhere on the body.  CAUSES The most common cause of folliculitis is an infection by germs (bacteria). Fungal and viral infections can also cause the condition. Viral infections may be more common in people whose bodies are unable to fight disease well (weakened immune systems). Examples include people with:  AIDS.   An organ transplant.   Cancer.  People with depressed immune systems, diabetes, or obesity, have a greater risk of getting folliculitis than the general population. Certain chemicals, especially oils and tars, also can cause folliculitis. SYMPTOMS  An early sign of folliculitis is a small, white or yellow pus-filled, itchy lesion (pustule). These lesions appear on a red, inflamed follicle. They are usually less than 5 mm (.20 inches).   The most likely starting points are the scalp, thighs, legs, back and buttocks. Folliculitis is also frequently found in areas of repeated shaving.   When an infection of the follicle goes deeper, it becomes a boil or furuncle. A group of closely packed boils create a larger lesion (a carbuncle). These sores (lesions) tend to occur in hairy, sweaty areas of the body.  TREATMENT   A doctor who specializes in skin problems (dermatologists) treats mild cases of folliculitis with antiseptic washes.   They also use a skin application which kills germs (topical antibiotics). Tea tree  oil is a good topical antiseptic as well. It can be found at a health food store. A small percentage of individuals may develop an allergy to the tea tree oil.   Mild to moderate boils respond well to warm water compresses applied three times daily.   In some cases, oral antibiotics should be taken with the skin treatment.   If lesions contain large quantities of pus or fluid, your caregiver may drain them. This allows the topical antibiotics to get to the affected areas better.   Stubborn cases of folliculitis may respond to laser hair removal. This process uses a high intensity light beam (a laser) to destroy the follicle and reduces the scarring from folliculitis. After laser hair removal, hair will no longer grow in the laser treated area.  Patients with long-lasting folliculitis need to find out where the infection is coming from. Germs can live in the nostrils of the patient. This can trigger an outbreak now and then. Sometimes the bacteria live in the nostrils of a family member. This person does not develop the disorder but they repeatedly re-expose others to the germ. To break the cycle of recurrence in the patient, the family member must also undergo treatment. PREVENTION   Individuals who are predisposed to folliculitis should be extremely careful about personal hygiene.   Application of antiseptic washes may help prevent recurrences.   A topical antibiotic cream, mupirocin (Bactroban), has been effective at reducing bacteria in the nostrils. It is applied inside the nose with your little finger. This is done twice daily for a week. Then it is repeated every 6 months.   Because follicle  disorders tend to come back, patients must receive follow-up care. Your caregiver may be able to recognize a recurrence before it becomes severe.  SEEK IMMEDIATE MEDICAL CARE IF:   You develop redness, swelling, or increasing pain in the area.   You have a fever.   You are not improving with  treatment or are getting worse.   You have any other questions or concerns.  Document Released: 04/23/2001 Document Revised: 02/01/2011 Document Reviewed: 02/18/2008 [facility] Patient Information 2012 [place], [place].

## 2011-11-14 ENCOUNTER — Other Ambulatory Visit: Payer: Self-pay | Admitting: Family Medicine

## 2011-11-14 ENCOUNTER — Telehealth: Payer: Self-pay | Admitting: Family Medicine

## 2011-11-14 LAB — URINE CULTURE: Colony Count: 15000

## 2011-11-14 MED ORDER — ACYCLOVIR 400 MG PO TABS: 400.0000 mg | ORAL_TABLET | Freq: Three times a day (TID) | ORAL | Status: DC

## 2011-11-14 NOTE — Telephone Encounter (Signed)
Returned call to patient.  Informed results are still preliminary and have not been finalized at lab yet.  Our office will call her back after labs are final and Dr. Cristal Ford has had a chance to review them.  Patient will call back if she has not heard from [place] by Friday (11/16/11) to check on her urine culture.  Gaylene Brooks, RN

## 2011-11-14 NOTE — Telephone Encounter (Signed)
Patient returned call. +HSV1 culture from genital lesion. Advised she f/u in one week for recheck. Avoid unprotected intercourse. May do serologic HSV testing in future.

## 2011-11-14 NOTE — Telephone Encounter (Signed)
Pt is asking for labs results - pls let her know if she can get the results today

## 2011-11-14 NOTE — Telephone Encounter (Signed)
Patient did not answer call. Did not leave detailed message with sensitive results. Advised her to call back. She did have positive HSV-1 (not typical of genital herpes). Unsure if this represents a primary infection given clinical appearance, but will treat with acyclovir x 7 days. Would like office f/u in next 1-2 weeks.

## 2011-11-23 ENCOUNTER — Ambulatory Visit (INDEPENDENT_AMBULATORY_CARE_PROVIDER_SITE_OTHER): Payer: Medicaid Other | Admitting: Family Medicine

## 2011-11-23 ENCOUNTER — Encounter: Payer: Self-pay | Admitting: Family Medicine

## 2011-11-23 VITALS — BP 99/68 | HR 102 | Temp 98.2°F | Ht 63.0 in | Wt 115.0 lb

## 2011-11-23 DIAGNOSIS — J069 Acute upper respiratory infection, unspecified: Secondary | ICD-10-CM

## 2011-11-23 MED ORDER — GUAIFENESIN-CODEINE 200-8 MG/5ML PO LIQD: 5.0000 mL | Freq: Every evening | ORAL | Status: DC | PRN

## 2011-11-23 NOTE — Assessment & Plan Note (Signed)
Only been going on 2 days, no signs of bacterial infection on exam.  Discussed symptomatic treatment (flo nase, netti pot, lots of fluids).  rx for cough medicaiton with codeine to help with night time cough and sleep.

## 2011-11-23 NOTE — Patient Instructions (Signed)

## 2011-11-23 NOTE — Progress Notes (Signed)
  Subjective:    Patient ID: Sierra Schneider, female    DOB: 12-05-1985, 26 y.o.   MRN: [id number]  HPI  Sierra Schneider comes in with nasal congestion x 2 days.  She says it was worst last night and she had a low grade fever of 99.  She has cough, nasal congestion, sore throat, and is feeling tired.  She does not have facial pain.  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   Review of Systems See HPI    Objective:   Physical Exam BP 99/68  Pulse 102  Temp 98.2 F (36.8 C) (Oral)  Ht 5\' 3"  (1.6 m)  Wt 115 lb (52.164 kg)  BMI 20.37 kg/m2 General appearance: alert, cooperative and no distress Eyes: PERRL, EOMIT Head: No sinus tenderness to palpation Ears: normal TM's and external ear canals both ears Nose: clear discharge, turbinates red Throat: lips, mucosa, and tongue normal; teeth and gums normal Neck: no adenopathy and supple, symmetrical, trachea midline Lungs: clear to auscultation bilaterally Heart: regular rate and rhythm, S1, S2 normal, no murmur, click, rub or gallop       Assessment & Plan:

## 2011-11-27 ENCOUNTER — Ambulatory Visit: Payer: Medicaid Other | Admitting: Family Medicine

## 2011-12-10 ENCOUNTER — Encounter: Payer: Self-pay | Admitting: Family Medicine

## 2011-12-10 ENCOUNTER — Ambulatory Visit (INDEPENDENT_AMBULATORY_CARE_PROVIDER_SITE_OTHER): Payer: Medicaid Other | Admitting: Family Medicine

## 2011-12-10 VITALS — BP 102/69 | HR 80 | Temp 98.2°F | Ht 63.0 in | Wt 116.6 lb

## 2011-12-10 DIAGNOSIS — R319 Hematuria, unspecified: Secondary | ICD-10-CM

## 2011-12-10 LAB — POCT UA - MICROSCOPIC ONLY

## 2011-12-10 LAB — BASIC METABOLIC PANEL
Chloride: 101 mEq/L (ref 96–112)
Glucose, Bld: 296 mg/dL — ABNORMAL HIGH (ref 70–99)
Potassium: 3.9 mEq/L (ref 3.5–5.3)
Sodium: 136 mEq/L (ref 135–145)

## 2011-12-10 LAB — POCT URINALYSIS DIPSTICK
Glucose, UA: 100
Protein, UA: 100
Spec Grav, UA: >=1.03
Urobilinogen, UA: 0.2
pH, UA: 6

## 2011-12-10 MED ORDER — CEPHALEXIN 500 MG PO CAPS: 500.0000 mg | ORAL_CAPSULE | Freq: Four times a day (QID) | ORAL | Status: DC

## 2011-12-10 NOTE — Progress Notes (Signed)
S: Pt comes in today for SDA for hematuria since last night.  Was bright red in toilet.  Is not currently having her period (has Mirena).  Happened 1x last night, then had non-bloody/non-red urine.  This AM had a very bright red urine but it was less red on her sample she gave here.  No dysuria or burning.  No vaginal discharge.  Nothing on her panties.  No fevers/chills.  Has had a cold/allergies recently (2 weeks ago).  Does have DM-- CBGs have been 100-150.  No new medicines or changes to medicines. No recent instrumentation.  No diarrhea, constipation, vomiting.  No back pain or muscle aches.    ROS: Per HPI  History  Smoking status  . Current Every Day Smoker -- 1.0 packs/day  . Types: Cigarettes  Smokeless tobacco  . Not on file    O:  Filed Vitals:   12/10/11 0839  BP: 102/69  Pulse: 80  Temp: 98.2 F (36.8 C)    Gen: NAD CV: RRR, no murmur Pulm: CTA bilat, no wheezes or crackles Abd: soft, NT, no CVA tenderness Ext: Warm, no edema   A/P: 26 y.o. female p/w idiopathic hematuria -See problem list -f/u in 1-2 weeks to ensure clearance

## 2011-12-10 NOTE — Assessment & Plan Note (Signed)
+   RBCs on micro, but also with likely UTI, will send for culture and treat with Keflex QID x 7 days since pt has DM.  Also with recent URI/vrius, so could be post-viral hematuria. F/u 1-2 weeks to ensure clearance of infection and blood.  Check BMET to ensure good Cr.

## 2011-12-10 NOTE — Patient Instructions (Addendum)
It was nice to meet you today.  I am not 100% sure what is causing the blood in your urine but is mostly likely from a UTI based on what your urine looks like.  I have sent in an antibiotic to the pharmacy for you to take for the next 7 days.   I am checking to make sure you kidneys are still working well.    Make sure you are drinking plenty of fluids-- your urine shows that you are dehydrated.  Come back in 1-2 weeks to make sure this has cleared up.

## 2011-12-11 ENCOUNTER — Telehealth: Payer: Self-pay | Admitting: Family Medicine

## 2011-12-11 NOTE — Telephone Encounter (Signed)
Keflex now. Informed of labs WNL, except glucose. Pt verbalized understanding. Lorenda Hatchet, Renato Battles

## 2011-12-11 NOTE — Telephone Encounter (Signed)
Is asking about results of her labs from yesterday

## 2011-12-11 NOTE — Telephone Encounter (Signed)
Called pt and informed of labs. Urine sent for cx. Pt is taking

## 2011-12-13 LAB — URINE CULTURE: Colony Count: >=100000

## 2011-12-24 ENCOUNTER — Telehealth: Payer: Self-pay | Admitting: Family Medicine

## 2011-12-24 NOTE — Telephone Encounter (Signed)
Is asking for Chantix starter pack  [facility] [address]

## 2011-12-24 NOTE — Telephone Encounter (Signed)
I have never met the patient. She needs to be seen by me before any new prescriptions will be written. Thank you.

## 2011-12-24 NOTE — Telephone Encounter (Signed)
Pt called back and info was given by Lynnea Ferrier .Arlyss Repress

## 2011-12-24 NOTE — Telephone Encounter (Signed)
Will fwd to PCP for review. .Sierra Schneider  

## 2011-12-24 NOTE — Telephone Encounter (Signed)
Called pt. Waiting for call back. Please tell pt to schedule OV. See previous message. Lorenda Hatchet, Renato Battles

## 2012-01-29 ENCOUNTER — Encounter: Payer: Self-pay | Admitting: Home Health Services

## 2012-01-31 ENCOUNTER — Ambulatory Visit (INDEPENDENT_AMBULATORY_CARE_PROVIDER_SITE_OTHER): Payer: Medicaid Other | Admitting: Family Medicine

## 2012-01-31 ENCOUNTER — Encounter: Payer: Self-pay | Admitting: Family Medicine

## 2012-01-31 VITALS — BP 113/76 | HR 88 | Ht 63.0 in | Wt 120.8 lb

## 2012-01-31 DIAGNOSIS — R51 Headache: Secondary | ICD-10-CM

## 2012-01-31 DIAGNOSIS — Z716 Tobacco abuse counseling: Secondary | ICD-10-CM

## 2012-01-31 DIAGNOSIS — R519 Headache, unspecified: Secondary | ICD-10-CM | POA: Insufficient documentation

## 2012-01-31 DIAGNOSIS — Z7189 Other specified counseling: Secondary | ICD-10-CM

## 2012-01-31 DIAGNOSIS — F17200 Nicotine dependence, unspecified, uncomplicated: Secondary | ICD-10-CM

## 2012-01-31 MED ORDER — MELOXICAM 7.5 MG PO TABS: 7.5000 mg | ORAL_TABLET | Freq: Every day | ORAL | Status: DC

## 2012-01-31 MED ORDER — VARENICLINE TARTRATE 0.5 MG X 11 & 1 MG X 42 PO MISC: ORAL | Status: DC

## 2012-01-31 NOTE — Assessment & Plan Note (Signed)
No red flags on exam today, may be tension type HA + some migraine component.  Will try daily Mobic with instructions to not use other NSAIDs including motrin, advil, aleve, BC/good powder.  Most recent Cr WNL.  F/u with PCP in 1 month to discuss if this is working.

## 2012-01-31 NOTE — Assessment & Plan Note (Signed)
Pt would like to try Chantix again, so started pack Rx'ed.  Discussed with patient to be aware of mood changes, especially given the fact she is on Zoloft.  If unable to tolerate chantix, pt aware that she can f/u with PCP or pharm clinic to discuss nicotine replacement therapy instead.  Encouraged pt to use 1-800 quit line.

## 2012-01-31 NOTE — Patient Instructions (Addendum)
It was good to see you again.  For your headache, it may be a couple different things combined.  Let's try an every day antiinflamatory medicine to see if we can help break the cycle.  Take the mobic every day for the next month.  If 1 tablet is not enough, you can take 2 at a time. But do not take more than 2.   It's great that you want to quit smoking! I highly recommend using the 1-800-QUIT-NOW line to help.   I am going to prescribe you Chantix since you have had success with this before.  If you have nausea with it, or end up not being able to afford it, please come back and we can discuss other options. If you want, you can make an appointment with the PHARMACY CLINIC for help with smoking cessation-- they are a really wonderful resource.  Plan to see you regular PCP in the next few weeks to month to see how your headaches and quitting smoking are going.  Have a great holiday!

## 2012-01-31 NOTE — Progress Notes (Signed)
S: Pt comes in today for headache and smoking cessation.  TOBACCO USE Smokes 1-1.5 ppd.  Has previously quit 3 times (for 9 months with her 2 pregnancies and then a 3rd time for 7 or 8 months), always restarts because of relationship stressors.  Has used Chantix successful all 3 times, but did have significant nausea with her most recent trial of it.  Thinks that smoking is related to her headaches and is just "really ready" to quit.  Has tried Wellbutrin and did not like it.  Did not have any mood changes with Chantix and is still taking Zoloft 150mg  daily with good mood stability at this time. Denies SI/HI.  Is not interested in using the patch/gum/etc at this time but would be if Chantix causes nausea again.    HEADACHE Has always had headaches off and on, but have become more frequent over the path 1 month.  Pain is typically dull/throbbing, behind both eyes.  Has a headache 5-6 days per week, may last for a variable amount of time from a few hours to all day.  Occasionally (~1 time every 2 weeks) will have a severe headache with photo and phonophobia and increased pain with movement.  No vision changes, numbness/tingling/weakness, N/V.  Has tried Tylenol, motrin, BC powder but nothing seems to help.  Sometimes has a headache when she goes to sleep and other times will wake up with an AM headache.  Feels well rested, gets 8+ hours of sleep every night.  No changes in caffeine intake, sleep, or medications since HAs have gotten worse.  Does feel like she has muscle spasms in her neck which have also worsened in the past month.    Has DM type I which is well controlled on her insulin pump per pt report (followed by Endo)     ROS: Per HPI  History  Smoking status  . Current Every Day Smoker -- 1.0 packs/day  . Types: Cigarettes  Smokeless tobacco  . Not on file    O:  Filed Vitals:   01/31/12 1634  BP: 113/76  Pulse: 88    Gen: NAD HEENT: EOMI, PERRLA, optic discs appear normal  CV:  RRR, no murmur Pulm: CTA bilat, no wheezes or crackles Neuro: CN 2-12 grossly intact, moves ext x4   A/P: 26 y.o. female p/w desire for smoking cessation, HAs -See problem list -f/u in 3-4 weeks with PCP

## 2012-02-03 ENCOUNTER — Emergency Department (HOSPITAL_COMMUNITY): Payer: Medicaid Other

## 2012-02-03 ENCOUNTER — Emergency Department (HOSPITAL_COMMUNITY)
Admission: EM | Admit: 2012-02-03 | Discharge: 2012-02-03 | Disposition: A | Discharge status: Home / Self Care | Payer: Medicaid Other | Attending: Emergency Medicine | Admitting: Emergency Medicine

## 2012-02-03 DIAGNOSIS — Y929 Unspecified place or not applicable: Secondary | ICD-10-CM | POA: Insufficient documentation

## 2012-02-03 DIAGNOSIS — Y9389 Activity, other specified: Secondary | ICD-10-CM | POA: Insufficient documentation

## 2012-02-03 DIAGNOSIS — Z794 Long term (current) use of insulin: Secondary | ICD-10-CM | POA: Insufficient documentation

## 2012-02-03 DIAGNOSIS — E109 Type 1 diabetes mellitus without complications: Secondary | ICD-10-CM | POA: Insufficient documentation

## 2012-02-03 DIAGNOSIS — F17200 Nicotine dependence, unspecified, uncomplicated: Secondary | ICD-10-CM | POA: Insufficient documentation

## 2012-02-03 DIAGNOSIS — W2209XA Striking against other stationary object, initial encounter: Secondary | ICD-10-CM | POA: Insufficient documentation

## 2012-02-03 DIAGNOSIS — S9030XA Contusion of unspecified foot, initial encounter: Secondary | ICD-10-CM | POA: Insufficient documentation

## 2012-02-03 DIAGNOSIS — S90129A Contusion of unspecified lesser toe(s) without damage to nail, initial encounter: Secondary | ICD-10-CM

## 2012-02-03 MED ORDER — IBUPROFEN 800 MG PO TABS
800.0000 mg | ORAL_TABLET | Freq: Once | ORAL | Status: AC
  Administered 2012-02-03: 800 mg via ORAL
  Filled 2012-02-03: qty 1

## 2012-02-03 MED ORDER — NAPROXEN 375 MG PO TABS: 375.0000 mg | ORAL_TABLET | Freq: Two times a day (BID) | ORAL | Status: DC

## 2012-02-03 NOTE — ED Provider Notes (Signed)
Medical screening examination/treatment/procedure(s) were performed by non-physician practitioner and as supervising physician I was immediately available for consultation/collaboration.   David H Yao, MD 02/03/12 2347 

## 2012-02-03 NOTE — ED Notes (Signed)
Pt c/o injury to 3rd and 4th toes on R foot. Pt states she was playing with her daughter and injured her foot. Pt wearing flip-flops. Ambulatory at triage to exam room.

## 2012-02-03 NOTE — ED Provider Notes (Signed)
History     CSN: [id number]  Arrival date & time 02/03/12  2020   First MD Initiated Contact with Patient 02/03/12 2023      Chief Complaint  Patient presents with  . Toe Injury    (Consider location/radiation/quality/duration/timing/severity/associated sxs/prior treatment) HPI Sierra Schneider is a 26 y.o. female who presents with complaint of pain to the toes after hitting them on a couch.  States pain to the 2nd, 3rd, 4th toes. They feel tingly. Pain with movement and palpation of the toes. Denies any other injuries to the foot, ankle. Did not ice or take any medications for it.    Past Medical History  Diagnosis Date  . Diabetes mellitus 08/2009    late onset type 1    Past Surgical History  Procedure Date  . Cesarean section     Family History  Problem Relation Age of Onset  . Depression Mother   . Depression Father   . Kidney disease Paternal Grandmother     History  Substance Use Topics  . Smoking status: Current Every Day Smoker -- 1.0 packs/day    Types: Cigarettes  . Smokeless tobacco: Not on file  . Alcohol Use: Yes     Comment: 1 drink every 3 months    OB History    Grav Para Term Preterm Abortions TAB SAB Ect Mult Living                  Review of Systems  Constitutional: Negative for fever and chills.  Musculoskeletal: Positive for arthralgias.  Skin: Negative.   Neurological: Negative for weakness and numbness.    Allergies  Review of patient's allergies indicates no known allergies.  Home Medications   Current Outpatient Rx  Name  Route  Sig  Dispense  Refill  . INSULIN ASPART 100 UNIT/ML [place] SOLN   Subcutaneous   Inject 40-50 Units into the skin continuous. Per insulin pump         . SERTRALINE HCL 50 MG PO TABS   Oral   Take 150 mg by mouth daily.           BP 115/79  Pulse 80  Temp 98.2 F (36.8 C)  Resp 17  SpO2 100%  Physical Exam  Nursing note and vitals reviewed. Constitutional: She appears well-developed  and well-nourished. No distress.  Cardiovascular: Normal rate, regular rhythm and normal heart sounds.   Pulmonary/Chest: Effort normal and breath sounds normal. No respiratory distress. She has no wheezes. She has no rales.  Musculoskeletal:       Normal appearing foot and toes. No swelling, bruising, abrasions. Tender to palpation over toes 2, 3, 4. Pain with movement of those toes in all directions. Good cap refill to those toes <2 sec.   Neurological: She is alert.  Skin: Skin is warm and dry.    ED Course  Procedures (including critical care time)  Labs Reviewed - No data to display Dg Foot Complete Right  02/03/2012  *RADIOLOGY REPORT*  Clinical Data: Pain in the fourth and fifth toes after injury.  RIGHT FOOT COMPLETE - 3+ VIEW  Comparison: None.  Findings: Focal area of benign-appearing sclerosis in the talus. Right foot is otherwise unremarkable.  No evidence of acute fracture or subluxation.  No focal bone destruction or cortical irregularity.  No expansile or lytic lesions.  No radiopaque soft tissue foreign bodies.  IMPRESSION: No acute bony abnormalities.   Original Report Authenticated By: Burman Nieves, M.D.  1. Contusion of foot including toes       MDM  X-ray of foot negative. Toes appear normal. No skin break. Toes buddy taped. Will treat with anti inflammatories, elevation, ice. Follow up as needed.         Lottie Mussel, [place] 02/03/12 [phone]

## 2012-02-03 NOTE — ED Notes (Signed)
Pt states she has a ride home. 

## 2012-05-19 ENCOUNTER — Encounter: Payer: Self-pay | Admitting: Family Medicine

## 2012-05-19 ENCOUNTER — Ambulatory Visit (INDEPENDENT_AMBULATORY_CARE_PROVIDER_SITE_OTHER): Payer: Medicaid Other | Admitting: Family Medicine

## 2012-05-19 VITALS — BP 108/75 | HR 80 | Temp 98.4°F | Ht 63.0 in | Wt 121.0 lb

## 2012-05-19 DIAGNOSIS — M549 Dorsalgia, unspecified: Secondary | ICD-10-CM

## 2012-05-19 MED ORDER — TRAMADOL HCL 50 MG PO TABS: 50.0000 mg | ORAL_TABLET | Freq: Three times a day (TID) | ORAL | Status: DC | PRN

## 2012-05-19 MED ORDER — CYCLOBENZAPRINE HCL 5 MG PO TABS: 5.0000 mg | ORAL_TABLET | Freq: Every evening | ORAL | Status: DC | PRN

## 2012-05-19 MED ORDER — KETOROLAC TROMETHAMINE 60 MG/2ML IM SOLN
60.0000 mg | Freq: Once | INTRAMUSCULAR | Status: AC
  Administered 2012-05-19: 60 mg via INTRAMUSCULAR

## 2012-05-19 NOTE — Patient Instructions (Signed)
Take the Tramadol during the day 1-2 pills every 8 hours as needed for pain.  Take the Flexeril at night for back spasms.  This will make you sleepy, don't drive with it.  Take Ibuprofen or Alleve 1-2 times a day to help with the inflammation as well.  Use heat and massage to help relieve the pain as well.  It was good to meet you today

## 2012-05-19 NOTE — Assessment & Plan Note (Signed)
No red flags.   Secondary to muscle spasm. Tramadol/Flexeril for relief.  See instructions.  NSAIDs as needed.  Toradol here for relief. FU in 10-14 days if no improvement, sooner if worsening.

## 2012-05-19 NOTE — Progress Notes (Signed)
Subjective:    Sierra Schneider is a 27 y.o. female who presents to [facility] today with complaints of back pain:  1. Back pain:  Describes aching pain in Right lumbar and thoracic region of back, worse when she bends forward or sits for prolonged periods of time.  Pain is 8 / 10, not relieved with OTC analgesics.  Started on Saturday, nothing unusual occurred last week or Friday, no increase in work.  Pain has been increasing since then.  Described as sharp, stabbing pain.  No injuries to her back.  No dysuria, hematuria, urinary frequency, radiation of pain to legs, motor weakness, decreased sensation, or headaches.  No fevers or chills.  No bladder/bowel incontinence or saddle anesthesia.    The following portions of the patient's history were reviewed and updated as appropriate: allergies, current medications, past medical history, family and social history, and problem list. Patient is a nonsmoker.    PMH reviewed.  Past Medical History  Diagnosis Date  . Diabetes mellitus 08/2009    late onset type 1   Past Surgical History  Procedure Laterality Date  . Cesarean section      Medications reviewed. Current Outpatient Prescriptions  Medication Sig Dispense Refill  . insulin aspart (NOVOLOG) 100 UNIT/ML injection Inject 40-50 Units into the skin continuous. Per insulin pump      . naproxen (NAPROSYN) 375 MG tablet Take 1 tablet (375 mg total) by mouth 2 (two) times daily.  20 tablet  0  . sertraline (ZOLOFT) 50 MG tablet Take 150 mg by mouth daily.       No current facility-administered medications for this visit.    ROS as above otherwise neg.  No chest pain, palpitations, SOB, Fever, Chills, Abd pain, N/V/D.   Objective:   Physical Exam BP 108/75  Pulse 80  Temp(Src) 98.4 F (36.9 C) (Oral)  Ht 5\' 3"  (1.6 m)  Wt 121 lb (54.885 kg)  BMI 21.44 kg/m2 Gen:  Alert, cooperative patient who appears stated age in no acute distress.  Vital signs reviewed. HEENT: EOMI,  MMM Cardiac:   Regular rate and rhythm without murmur auscultated.  Good S1/S2. Pulm:  Clear to auscultation bilaterally with good air movement.  No wheezes or rales noted.   Back:  Normal skin, Spine with normal alignment and no deformity.  No tenderness to vertebral process palpation.  Paraspinous muscles are tender and with spasm Right thoracic and lumbar.   Range of motion is full at neck but decreased at lumbar sacral regions with flexion and extension, limited by pain.  Straight leg raise is positive for Right sided back pain with Right leg lift.  Neuro:  Sensation and motor function 5/5 bilateral lower extremities.  Patellar and Achilles  DTR's +2 patellar BL    No results found for this or any previous visit (from the past 72 hour(s)).

## 2012-06-21 ENCOUNTER — Other Ambulatory Visit: Payer: Self-pay | Admitting: Family Medicine

## 2012-07-15 ENCOUNTER — Telehealth: Payer: Self-pay | Admitting: Family Medicine

## 2012-07-15 DIAGNOSIS — F32A Depression, unspecified: Secondary | ICD-10-CM

## 2012-07-15 DIAGNOSIS — F329 Major depressive disorder, single episode, unspecified: Secondary | ICD-10-CM

## 2012-07-15 MED ORDER — SERTRALINE HCL 50 MG PO TABS: 200.0000 mg | ORAL_TABLET | Freq: Every day | ORAL | Status: DC

## 2012-07-15 NOTE — Telephone Encounter (Signed)
Pt reports taking 4 zoloft pills daily (200mg ) - MAR list states to take 100mg  daily. Pt states that she discussed with Dr. Edmonia James some time ago to take 200 mg and that is what she has been taking - also states that she has been receiving #60 pills instead of #90 as prescription states.  [facility] [address] [facility] states that supply is only covered for 30 days and pt will only receive #60 at any one time  Please advise as to exact mg of zoloft pt should be taking. Thanks! Wyatt Haste, RN-BSN

## 2012-07-15 NOTE — Telephone Encounter (Signed)
I left voicemail for patient stating that I will send refill of sertraline 200 mg daily to there pharmacy. I also stated that she needs to follow up in 4-6 weeks to determine efficacy.

## 2012-07-15 NOTE — Telephone Encounter (Signed)
Pt was on zoloft and she had discussed with Dr Edmonia James increased to 200 mg instead of 100 mg - please look back at previous visit and see if you see anything that would indicated the increase, please let know what she is supposed to be taking.

## 2012-10-06 ENCOUNTER — Other Ambulatory Visit: Payer: Self-pay | Admitting: *Deleted

## 2012-10-06 MED ORDER — INSULIN ASPART 100 UNIT/ML ~~LOC~~ SOLN: 40.0000 [IU] | SUBCUTANEOUS | Status: DC

## 2012-10-08 ENCOUNTER — Telehealth: Payer: Self-pay | Admitting: *Deleted

## 2012-10-09 ENCOUNTER — Emergency Department (HOSPITAL_COMMUNITY): Payer: Medicaid Other

## 2012-10-09 ENCOUNTER — Encounter (HOSPITAL_COMMUNITY): Payer: Self-pay | Admitting: *Deleted

## 2012-10-09 ENCOUNTER — Emergency Department (HOSPITAL_COMMUNITY)
Admission: EM | Admit: 2012-10-09 | Discharge: 2012-10-09 | Discharge status: Against Medical Advice | Payer: Medicaid Other | Attending: Emergency Medicine | Admitting: Emergency Medicine

## 2012-10-09 DIAGNOSIS — R1011 Right upper quadrant pain: Secondary | ICD-10-CM

## 2012-10-09 DIAGNOSIS — Z79899 Other long term (current) drug therapy: Secondary | ICD-10-CM | POA: Insufficient documentation

## 2012-10-09 DIAGNOSIS — E109 Type 1 diabetes mellitus without complications: Secondary | ICD-10-CM | POA: Insufficient documentation

## 2012-10-09 DIAGNOSIS — F17200 Nicotine dependence, unspecified, uncomplicated: Secondary | ICD-10-CM | POA: Insufficient documentation

## 2012-10-09 DIAGNOSIS — R110 Nausea: Secondary | ICD-10-CM

## 2012-10-09 DIAGNOSIS — Z794 Long term (current) use of insulin: Secondary | ICD-10-CM | POA: Insufficient documentation

## 2012-10-09 LAB — URINALYSIS, ROUTINE W REFLEX MICROSCOPIC
Hgb urine dipstick: NEGATIVE
Leukocytes, UA: NEGATIVE
Protein, ur: NEGATIVE mg/dL
Urobilinogen, UA: 1 mg/dL (ref 0.0–1.0)

## 2012-10-09 LAB — CBC
HCT: 37.5 % (ref 36.0–46.0)
MCV: 90.4 fL (ref 78.0–100.0)
RBC: 4.15 MIL/uL (ref 3.87–5.11)
WBC: 11.9 10*3/uL — ABNORMAL HIGH (ref 4.0–10.5)

## 2012-10-09 LAB — COMPREHENSIVE METABOLIC PANEL
BUN: 10 mg/dL (ref 6–23)
CO2: 28 mEq/L (ref 19–32)
Chloride: 100 mEq/L (ref 96–112)
Creatinine, Ser: 0.59 mg/dL (ref 0.50–1.10)
GFR calc non Af Amer: >90 mL/min (ref >90)
Glucose, Bld: 280 mg/dL — ABNORMAL HIGH (ref 70–99)
Total Bilirubin: 0.2 mg/dL — ABNORMAL LOW (ref 0.3–1.2)

## 2012-10-09 LAB — LIPASE, BLOOD: Lipase: 26 U/L (ref 11–59)

## 2012-10-09 NOTE — ED Notes (Signed)
Pt left ama. PA and charge informed.

## 2012-10-09 NOTE — ED Provider Notes (Signed)
CSN: [id number]     Arrival date & time 10/09/12  1726 History     First MD Initiated Contact with Patient 10/09/12 1738     Chief Complaint  Patient presents with  . Abdominal Pain   (Consider location/radiation/quality/duration/timing/severity/associated sxs/prior Treatment) Patient is a 27 y.o. female presenting with abdominal pain. The history is provided by the patient and medical records. No language interpreter was used.  Abdominal Pain Associated symptoms: nausea   Associated symptoms: no chest pain, no constipation, no cough, no diarrhea, no dysuria, no fatigue, no fever, no hematuria, no shortness of breath and no vomiting     TONETTE KOEHNE is a 27 y.o. female  with a hx of DM presents to the Emergency Department complaining of sudden, persistent, progressively worsening dull abdominal pain beginning 2 days ago.  Pain is located in the RUQ/epigastric region wihtout radiation.  Pt with assoc nausea but no vomiting.  Pts pain is worsened with food and walking.  Nothing makes it better.  Pt denies fever, chills, headache, neck pain, chest pain, SOB, diarrhea, weakness, dizziness, dysuria, hematuria, vaginal discharge.  IUD in place.  Pt IDDM with insulin pump and Novolog. Pt without hx of gallstones.    Past Medical History  Diagnosis Date  . Diabetes mellitus 08/2009    late onset type 1   Past Surgical History  Procedure Laterality Date  . Cesarean section     Family History  Problem Relation Age of Onset  . Depression Mother   . Depression Father   . Kidney disease Paternal Grandmother    History  Substance Use Topics  . Smoking status: Current Every Day Smoker -- 1.00 packs/day    Types: Cigarettes  . Smokeless tobacco: Not on file  . Alcohol Use: Yes     Comment: 1 drink every 3 months   OB History   Grav Para Term Preterm Abortions TAB SAB Ect Mult Living                 Review of Systems  Constitutional: Negative for fever, diaphoresis, appetite  change, fatigue and unexpected weight change.  HENT: Negative for mouth sores and neck stiffness.   Eyes: Negative for visual disturbance.  Respiratory: Negative for cough, chest tightness, shortness of breath and wheezing.   Cardiovascular: Negative for chest pain.  Gastrointestinal: Positive for nausea and abdominal pain. Negative for vomiting, diarrhea and constipation.  Endocrine: Negative for polydipsia, polyphagia and polyuria.  Genitourinary: Negative for dysuria, urgency, frequency and hematuria.  Musculoskeletal: Negative for back pain.  Skin: Negative for rash.  Allergic/Immunologic: Negative for immunocompromised state.  Neurological: Negative for syncope, light-headedness and headaches.  Hematological: Does not bruise/bleed easily.  Psychiatric/Behavioral: Negative for sleep disturbance. The patient is not nervous/anxious.     Allergies  Review of patient's allergies indicates no known allergies.  Home Medications   Current Outpatient Rx  Name  Route  Sig  Dispense  Refill  . insulin aspart (NOVOLOG) 100 UNIT/ML injection   Subcutaneous   Inject 40-50 Units into the skin continuous. Per insulin pump   3 vial   3   . sertraline (ZOLOFT) 50 MG tablet   Oral   Take 4 tablets (200 mg total) by mouth daily.   120 tablet   5     Patient needs to schedule appointment with her doc ...    BP 110/69  Pulse 72  Temp(Src) 98.9 F (37.2 C) (Oral)  Resp 18  SpO2 98% Physical Exam  Nursing note and vitals reviewed. Constitutional: She appears well-developed and well-nourished.  HENT:  Head: Normocephalic and atraumatic.  Mouth/Throat: Oropharynx is clear and moist.  Eyes: Conjunctivae are normal. No scleral icterus.  Cardiovascular: Normal rate, regular rhythm, normal heart sounds and intact distal pulses.   No murmur heard. Pulmonary/Chest: Effort normal and breath sounds normal. She has no decreased breath sounds. She has no wheezes. She has no rhonchi. She has no  rales.  Abdominal: Soft. Bowel sounds are normal. She exhibits no distension and no mass. There is tenderness in the right upper quadrant and epigastric area. There is guarding and positive Murphy's sign. There is no rigidity, no rebound and no tenderness at McBurney's point.  Neurological: She is alert.  Skin: Skin is warm and dry.  Psychiatric: She has a normal mood and affect.    ED Course   Procedures (including critical care time)  Labs Reviewed  CBC - Abnormal; Notable for the following:    WBC 11.9 (*)    All other components within normal limits  COMPREHENSIVE METABOLIC PANEL - Abnormal; Notable for the following:    Sodium 134 (*)    Glucose, Bld 280 (*)    Total Bilirubin 0.2 (*)    All other components within normal limits  URINALYSIS, ROUTINE W REFLEX MICROSCOPIC - Abnormal; Notable for the following:    Specific Gravity, Urine 1.037 (*)    Glucose, UA >1000 (*)    All other components within normal limits  URINE MICROSCOPIC-ADD ON - Abnormal; Notable for the following:    Squamous Epithelial / LPF FEW (*)    All other components within normal limits  LIPASE, BLOOD   US Abdomen Complete  10/09/2012   *RADIOLOGY REPORT*  Clinical Data:  Right upper quadrant abdominal pain.  COMPLETE ABDOMINAL ULTRASOUND  Comparison:  Prior study 07/06/2010.  Findings:  Gallbladder:  No gallstones, gallbladder wall thickening, or pericholecystic fluid.  Common bile duct:  Normal in caliber measuring a maximum of 3.45mm.  Liver:  The liver is sonographically unremarkable.  There is normal echogenicity without focal lesions or intrahepatic biliary dilatation.  IVC:  Normal caliber.  Pancreas:  Incompletely visualized. The distal body and tail are not well seen.  Spleen:  Normal size and echogenicity without focal lesions.  Right Kidney:  12.6 cm in length. Normal renal cortical thickness and echogenicity without focal lesions or hydronephrosis.  Left Kidney:  11.9 cm in length. Normal renal  cortical thickness and echogenicity without focal lesions or hydronephrosis.  Abdominal aorta:  Normal caliber.  IMPRESSION:  Unremarkable abdominal ultrasound examination.  Incomplete visualization of pancreas.   Original Report Authenticated By: Rudie Meyer, M.D.   1. RUQ abdominal pain   2. Nausea     MDM  Nelly Rout [facility] presents with RUQ abd pain and nausea.  Symptoms worsen with eating and are associated with decreased appetite.  She is alert, oriented, nontoxic, nonseptic appearing. Mild right upper quadrant epigastric pain on exam. Will obtain labs and right upper quadrant ultrasound. Cholelithiasis versus GERD. We'll also assess for choline cystitis and choledocholithiasis.  7:42 PM Pt left AMA/eloped from the ER prior to completion of work-up or discussion with me about results, options for treatment and/or AMA concerns.    UA without evidence of urinary tract infection, CBC with mild facet is 11.9, CMP with elevated glucose but at patient baseline and lipase within normal limits. Ultrasound without evidence of gallstones.   Dahlia Client Yida Hyams, PA-C 10/10/12 [phone]

## 2012-10-09 NOTE — ED Notes (Signed)
Pt reports generalized abd pain x 2 days, worse at night.  Pt denies any n/v/d at this time.  Pt also denies any urinary or GYN sxs at this time.

## 2012-10-10 NOTE — ED Provider Notes (Signed)
Medical screening examination/treatment/procedure(s) were performed by non-physician practitioner and as supervising physician I was immediately available for consultation/collaboration.   Tage Feggins S Reshma Hoey, MD 10/10/12 1224 

## 2012-10-12 ENCOUNTER — Emergency Department (HOSPITAL_BASED_OUTPATIENT_CLINIC_OR_DEPARTMENT_OTHER): Payer: Medicaid Other

## 2012-10-12 ENCOUNTER — Encounter (HOSPITAL_BASED_OUTPATIENT_CLINIC_OR_DEPARTMENT_OTHER): Payer: Self-pay | Admitting: *Deleted

## 2012-10-12 ENCOUNTER — Observation Stay (HOSPITAL_BASED_OUTPATIENT_CLINIC_OR_DEPARTMENT_OTHER)
Admission: EM | Admit: 2012-10-12 | Discharge: 2012-10-12 | Disposition: A | Discharge status: Home / Self Care | Payer: Medicaid Other | Source: Home / Self Care | Attending: Emergency Medicine | Admitting: Emergency Medicine

## 2012-10-12 ENCOUNTER — Inpatient Hospital Stay (HOSPITAL_COMMUNITY)
Admission: EM | Admit: 2012-10-12 | Discharge: 2012-10-15 | DRG: 384 | Disposition: A | Discharge status: Home / Self Care | Payer: Medicaid Other | Attending: Family Medicine | Admitting: Family Medicine

## 2012-10-12 ENCOUNTER — Encounter (HOSPITAL_COMMUNITY): Payer: Self-pay | Admitting: Emergency Medicine

## 2012-10-12 DIAGNOSIS — R1011 Right upper quadrant pain: Secondary | ICD-10-CM

## 2012-10-12 DIAGNOSIS — D72829 Elevated white blood cell count, unspecified: Secondary | ICD-10-CM | POA: Diagnosis present

## 2012-10-12 DIAGNOSIS — F32A Depression, unspecified: Secondary | ICD-10-CM

## 2012-10-12 DIAGNOSIS — Z975 Presence of (intrauterine) contraceptive device: Secondary | ICD-10-CM

## 2012-10-12 DIAGNOSIS — F329 Major depressive disorder, single episode, unspecified: Secondary | ICD-10-CM | POA: Diagnosis present

## 2012-10-12 DIAGNOSIS — Z794 Long term (current) use of insulin: Secondary | ICD-10-CM

## 2012-10-12 DIAGNOSIS — Z79899 Other long term (current) drug therapy: Secondary | ICD-10-CM

## 2012-10-12 DIAGNOSIS — E876 Hypokalemia: Secondary | ICD-10-CM | POA: Diagnosis present

## 2012-10-12 DIAGNOSIS — E109 Type 1 diabetes mellitus without complications: Secondary | ICD-10-CM

## 2012-10-12 DIAGNOSIS — F17200 Nicotine dependence, unspecified, uncomplicated: Secondary | ICD-10-CM | POA: Diagnosis present

## 2012-10-12 DIAGNOSIS — Z9641 Presence of insulin pump (external) (internal): Secondary | ICD-10-CM

## 2012-10-12 DIAGNOSIS — R1013 Epigastric pain: Secondary | ICD-10-CM | POA: Diagnosis present

## 2012-10-12 DIAGNOSIS — Z716 Tobacco abuse counseling: Secondary | ICD-10-CM

## 2012-10-12 DIAGNOSIS — F3289 Other specified depressive episodes: Secondary | ICD-10-CM | POA: Diagnosis present

## 2012-10-12 DIAGNOSIS — E1065 Type 1 diabetes mellitus with hyperglycemia: Secondary | ICD-10-CM | POA: Diagnosis present

## 2012-10-12 DIAGNOSIS — IMO0002 Reserved for concepts with insufficient information to code with codable children: Secondary | ICD-10-CM | POA: Diagnosis present

## 2012-10-12 DIAGNOSIS — K269 Duodenal ulcer, unspecified as acute or chronic, without hemorrhage or perforation: Principal | ICD-10-CM | POA: Diagnosis present

## 2012-10-12 LAB — COMPREHENSIVE METABOLIC PANEL
BUN: 7 mg/dL (ref 6–23)
CO2: 28 mEq/L (ref 19–32)
Chloride: 98 mEq/L (ref 96–112)
Creatinine, Ser: 0.5 mg/dL (ref 0.50–1.10)
GFR calc non Af Amer: >90 mL/min (ref >90)
Glucose, Bld: 385 mg/dL — ABNORMAL HIGH (ref 70–99)
Total Bilirubin: 0.3 mg/dL (ref 0.3–1.2)

## 2012-10-12 LAB — CBC WITH DIFFERENTIAL/PLATELET
Basophils Relative: 0 % (ref 0–1)
HCT: 39.8 % (ref 36.0–46.0)
Hemoglobin: 13.6 g/dL (ref 12.0–15.0)
Lymphocytes Relative: 19 % (ref 12–46)
Lymphs Abs: 2.5 10*3/uL (ref 0.7–4.0)
MCHC: 34.2 g/dL (ref 30.0–36.0)
Monocytes Absolute: 1 10*3/uL (ref 0.1–1.0)
Monocytes Relative: 8 % (ref 3–12)
Neutro Abs: 9.2 10*3/uL — ABNORMAL HIGH (ref 1.7–7.7)
RBC: 4.35 MIL/uL (ref 3.87–5.11)

## 2012-10-12 LAB — URINALYSIS, ROUTINE W REFLEX MICROSCOPIC
Glucose, UA: >1000 mg/dL — AB
Hgb urine dipstick: NEGATIVE
Protein, ur: NEGATIVE mg/dL

## 2012-10-12 LAB — URINE MICROSCOPIC-ADD ON

## 2012-10-12 LAB — LIPASE, BLOOD: Lipase: 35 U/L (ref 11–59)

## 2012-10-12 LAB — PREGNANCY, URINE: Preg Test, Ur: NEGATIVE

## 2012-10-12 MED ORDER — IOHEXOL 300 MG/ML  SOLN
50.0000 mL | Freq: Once | INTRAMUSCULAR | Status: AC | PRN
  Administered 2012-10-12: 50 mL via ORAL

## 2012-10-12 MED ORDER — SODIUM CHLORIDE 0.9 % IV BOLUS (SEPSIS)
1000.0000 mL | Freq: Once | INTRAVENOUS | Status: AC
  Administered 2012-10-12: 1000 mL via INTRAVENOUS

## 2012-10-12 MED ORDER — NICOTINE 14 MG/24HR TD PT24
14.0000 mg | MEDICATED_PATCH | Freq: Once | TRANSDERMAL | Status: DC
  Filled 2012-10-12: qty 1

## 2012-10-12 MED ORDER — HYDROMORPHONE HCL PF 1 MG/ML IJ SOLN
1.0000 mg | Freq: Once | INTRAMUSCULAR | Status: AC
  Administered 2012-10-12: 1 mg via INTRAVENOUS
  Filled 2012-10-12: qty 1

## 2012-10-12 MED ORDER — IOHEXOL 300 MG/ML  SOLN
100.0000 mL | Freq: Once | INTRAMUSCULAR | Status: AC | PRN
  Administered 2012-10-12: 100 mL via INTRAVENOUS

## 2012-10-12 NOTE — ED Provider Notes (Signed)
CSN: [id number]     Arrival date & time 10/12/12  1218 History  This chart was scribed for Charles B. Bernette Mayers, MD by Ardelia Mems, ED Scribe. This patient was seen in room MH09/MH09 and the patient's care was started at 3:47 PM.    Chief Complaint  Patient presents with  . Abdominal Pain    The history is provided by the patient. No language interpreter was used.    HPI Comments: Sierra Schneider is a 27 y.o. female with a history of DM who presents to the Emergency Department complaining of gradual onset, gradually worsening, constant, moderate, "sharp" RUQ and epigastric abdominal pain, with mild RLQ abdominal pain over the past 5 days. She states that she was seen at [facility] 3 days ago for the same pain and she had normal lab findings. She left before receiving [place] results from that visit. She states that the pain is not made better by anything and is made worse with sitting for long periods at work. She also admits that her pain is made worse by bumps in the road during car travel. She states that she tried taking a laxative yesterday, although she did not have any bowel symtpoms, without relief of her abdominal pain. She states that she has had some intemerittent nausea for the past 5 days, but denies nausea currently. Pt has IDDM which is controlled with an insulin pump and Novolog. She denies fever, chills, vomiting, vaginal bleeding, vaginal discharge, diarrhea, constipation, back pain or any other symptoms. She is a current every day smoker of 1 pack/day and an occasional alcohol user.  PCP- Dr. Mat Carne   Past Medical History  Diagnosis Date  . Diabetes mellitus 08/2009    late onset type 1   Past Surgical History  Procedure Laterality Date  . Cesarean section     Family History  Problem Relation Age of Onset  . Depression Mother   . Depression Father   . Kidney disease Paternal Grandmother    History  Substance Use Topics  . Smoking status: Current Every Day  Smoker -- 1.00 packs/day    Types: Cigarettes  . Smokeless tobacco: Not on file  . Alcohol Use: Yes     Comment: 1 drink every 3 months   OB History   Grav Para Term Preterm Abortions TAB SAB Ect Mult Living                 Review of Systems A complete 10 system review of systems was obtained and all systems are negative except as noted in the HPI and PMH.   Allergies  Review of patient's allergies indicates no known allergies.  Home Medications   Current Outpatient Rx  Name  Route  Sig  Dispense  Refill  . insulin aspart (NOVOLOG) 100 UNIT/ML injection   Subcutaneous   Inject 40-50 Units into the skin continuous. Per insulin pump   3 vial   3   . sertraline (ZOLOFT) 50 MG tablet   Oral   Take 4 tablets (200 mg total) by mouth daily.   120 tablet   5     Patient needs to schedule appointment with her doc ...    Triage Vitals: BP 107/69  Pulse 82  Temp(Src) 99.1 F (37.3 C) (Oral)  Resp 16  Ht 5\' 2"  (1.575 m)  Wt 120 lb (54.432 kg)  BMI 21.94 kg/m2  SpO2 98%  Physical Exam  Nursing note and vitals reviewed. Constitutional: She is oriented to  person, place, and time. She appears well-developed and well-nourished.  HENT:  Head: Normocephalic and atraumatic.  Eyes: EOM are normal. Pupils are equal, round, and reactive to light.  Neck: Normal range of motion. Neck supple.  Cardiovascular: Normal rate, normal heart sounds and intact distal pulses.   Pulmonary/Chest: Effort normal and breath sounds normal.  Abdominal: Bowel sounds are normal. She exhibits no distension. There is tenderness (Difffuse, worse in RUQ and epigastric area, but also in RLQ). There is no rebound and no guarding.  Musculoskeletal: Normal range of motion. She exhibits no edema and no tenderness.  Neurological: She is alert and oriented to person, place, and time. She has normal strength. No cranial nerve deficit or sensory deficit.  Skin: Skin is warm and dry. No rash noted.  Psychiatric:  She has a normal mood and affect.    ED Course   Medications  nicotine (NICODERM CQ - dosed in mg/24 hours) patch 14 mg (not administered)  sodium chloride 0.9 % bolus 1,000 mL (1,000 mL Intravenous New Bag/Given 10/12/12 1612)  HYDROmorphone (DILAUDID) injection 1 mg (1 mg Intravenous Given 10/12/12 1631)  iohexol (OMNIPAQUE) 300 MG/ML solution 50 mL (50 mL Oral Contrast Given 10/12/12 1717)  iohexol (OMNIPAQUE) 300 MG/ML solution 100 mL (100 mL Intravenous Contrast Given 10/12/12 1716)   Procedures (including critical care time)  DIAGNOSTIC STUDIES: Oxygen Saturation is 98% on RA, normal by my interpretation.    COORDINATION OF CARE: 3:52 PM- Pt advised of plan for diagnostic lab work and radiology and pt ultimately agrees, after complaining about the length it will take for results from this visit. She agrees with plan to receive IV fluids and Dilaudid in the ED, but rejects nausea medication.   Labs Reviewed  URINALYSIS, ROUTINE W REFLEX MICROSCOPIC - Abnormal; Notable for the following:    Specific Gravity, Urine 1.031 (*)    Glucose, UA >1000 (*)    Ketones, ur 15 (*)    All other components within normal limits  CBC WITH DIFFERENTIAL - Abnormal; Notable for the following:    WBC 13.1 (*)    Neutro Abs 9.2 (*)    All other components within normal limits  COMPREHENSIVE METABOLIC PANEL - Abnormal; Notable for the following:    Potassium 3.4 (*)    Glucose, Bld 385 (*)    All other components within normal limits  PREGNANCY, URINE  LIPASE, BLOOD  URINE MICROSCOPIC-ADD ON   Ct Abdomen Pelvis W Contrast  10/12/2012   *RADIOLOGY REPORT*  Clinical Data: Abdominal pain.  CT ABDOMEN AND PELVIS WITH CONTRAST  Technique:  Multidetector CT imaging of the abdomen and pelvis was performed following the standard protocol during bolus administration of intravenous contrast.  Contrast: OMNIPAQUE IOHEXOL 300 MG/ML  SOLN, 50mL OMNIPAQUE IOHEXOL 300 MG/ML  SOLN  Comparison: Ultrasound  10/09/2012.  CT 07/06/2010.  Findings: Lung bases are clear.  No effusions.  Heart is normal size.  Liver, gallbladder, spleen, pancreas, adrenals and kidneys and are normal.  IUD is in place within the uterus.  Small cyst or follicle in the right ovary.  Adnexa unremarkable.  Appendix is visualized and is normal. Bowel grossly unremarkable.  No free fluid, free air, or adenopathy.  No acute bony abnormality.  IMPRESSION: Unremarkable study.   Original Report Authenticated By: Charlett Nose, M.D.    1. RUQ abdominal pain     MDM  Labs and imaging reviewed. Leukocytosis is moderately worsened from recent visit, still having RUQ pain and tenderness. Discussed  with Dr. Gerrit Friends on call for Gen Surg who recommend medical admission for further eval including HIDA, GI consult/EGD, etc. I spoke with [facility] resident on call who has accepted the patient for transfer, however the patient has decided she does not want to be transported by [facility]. She wants to go smoke and check on her children. I have advised her that going to [facility] via private vehicle may delay her admission as she will need to check back in through the ED and there may be a prolonged wait time. I also offered nicotine patch which she initially accepted but then decided to leave. She will sign out AMA. Advised to go to the [facility] for further evaluation.        I personally performed the services described in this documentation, which was scribed in my presence. The recorded information has been reviewed and is accurate.     Charles B. Bernette Mayers, MD 10/12/12 0981

## 2012-10-12 NOTE — ED Notes (Signed)
PT. REPORTS RUQ PAIN FOR 5 DAYS , SEEN AND EXAMINED AT [facility] TODAY - CT SCAN /BLOOD TEST AND URINE TEST DONE .

## 2012-10-12 NOTE — ED Notes (Signed)
Patient was seen two days ago and had an U/S done but left before she received the results. Patient also had bloodwork done at that time. Is back today because her abd pain has not resolved.

## 2012-10-13 ENCOUNTER — Inpatient Hospital Stay (HOSPITAL_COMMUNITY): Payer: Medicaid Other

## 2012-10-13 ENCOUNTER — Encounter (HOSPITAL_COMMUNITY): Payer: Self-pay | Admitting: *Deleted

## 2012-10-13 DIAGNOSIS — R1013 Epigastric pain: Secondary | ICD-10-CM | POA: Diagnosis present

## 2012-10-13 DIAGNOSIS — E109 Type 1 diabetes mellitus without complications: Secondary | ICD-10-CM

## 2012-10-13 DIAGNOSIS — R1011 Right upper quadrant pain: Secondary | ICD-10-CM

## 2012-10-13 LAB — COMPREHENSIVE METABOLIC PANEL
ALT: 16 U/L (ref 0–35)
AST: 19 U/L (ref 0–37)
Alkaline Phosphatase: 56 U/L (ref 39–117)
CO2: 28 mEq/L (ref 19–32)
Calcium: 8.6 mg/dL (ref 8.4–10.5)
GFR calc Af Amer: >90 mL/min (ref >90)
Glucose, Bld: 88 mg/dL (ref 70–99)
Potassium: 4 mEq/L (ref 3.5–5.1)
Sodium: 140 mEq/L (ref 135–145)
Total Protein: 6 g/dL (ref 6.0–8.3)

## 2012-10-13 LAB — DIFFERENTIAL
Lymphocytes Relative: 30 % (ref 12–46)
Lymphs Abs: 3.5 10*3/uL (ref 0.7–4.0)
Monocytes Absolute: 0.9 10*3/uL (ref 0.1–1.0)
Monocytes Relative: 8 % (ref 3–12)
Neutro Abs: 6.7 10*3/uL (ref 1.7–7.7)
Neutrophils Relative %: 59 % (ref 43–77)

## 2012-10-13 LAB — CBC
HCT: 35.4 % — ABNORMAL LOW (ref 36.0–46.0)
MCHC: 34.5 g/dL (ref 30.0–36.0)
MCV: 89.6 fL (ref 78.0–100.0)
Platelets: 245 10*3/uL (ref 150–400)
RDW: 13 % (ref 11.5–15.5)

## 2012-10-13 LAB — HEMOGLOBIN A1C: Mean Plasma Glucose: 177 mg/dL — ABNORMAL HIGH (ref <117)

## 2012-10-13 LAB — GLUCOSE, CAPILLARY
Glucose-Capillary: 92 mg/dL (ref 70–99)
Glucose-Capillary: 144 mg/dL — ABNORMAL HIGH (ref 70–99)
Glucose-Capillary: 304 mg/dL — ABNORMAL HIGH (ref 70–99)

## 2012-10-13 MED ORDER — HYDROCODONE-ACETAMINOPHEN 5-325 MG PO TABS
1.0000 | ORAL_TABLET | ORAL | Status: DC | PRN
  Administered 2012-10-13 – 2012-10-14 (×2): 1 via ORAL
  Filled 2012-10-13 (×2): qty 1

## 2012-10-13 MED ORDER — INSULIN PUMP
Freq: Three times a day (TID) | SUBCUTANEOUS | Status: DC
  Administered 2012-10-13: 12.5 via SUBCUTANEOUS
  Administered 2012-10-13: 0.85 via SUBCUTANEOUS
  Administered 2012-10-13 – 2012-10-14 (×3): via SUBCUTANEOUS
  Administered 2012-10-15: 0.85 via SUBCUTANEOUS
  Filled 2012-10-13: qty 1

## 2012-10-13 MED ORDER — MORPHINE SULFATE 4 MG/ML IJ SOLN
4.0000 mg | Freq: Once | INTRAMUSCULAR | Status: AC
  Administered 2012-10-13: 4 mg via INTRAVENOUS
  Filled 2012-10-13: qty 1

## 2012-10-13 MED ORDER — DEXTROSE 50 % IV SOLN
INTRAVENOUS | Status: AC
  Administered 2012-10-13: 10 mL
  Filled 2012-10-13: qty 50

## 2012-10-13 MED ORDER — ONDANSETRON HCL 4 MG/2ML IJ SOLN
4.0000 mg | Freq: Once | INTRAMUSCULAR | Status: AC
  Administered 2012-10-13: 4 mg via INTRAVENOUS
  Filled 2012-10-13: qty 2

## 2012-10-13 MED ORDER — MORPHINE SULFATE 2 MG/ML IJ SOLN
2.0000 mg | Freq: Once | INTRAMUSCULAR | Status: AC
  Administered 2012-10-13: 2 mg via INTRAVENOUS
  Filled 2012-10-13: qty 1

## 2012-10-13 MED ORDER — ACETAMINOPHEN 325 MG PO TABS
650.0000 mg | ORAL_TABLET | Freq: Four times a day (QID) | ORAL | Status: DC | PRN
  Administered 2012-10-13: 650 mg via ORAL
  Filled 2012-10-13: qty 2

## 2012-10-13 MED ORDER — SODIUM CHLORIDE 0.45 % IV SOLN
INTRAVENOUS | Status: DC
  Administered 2012-10-13 – 2012-10-15 (×3): via INTRAVENOUS

## 2012-10-13 MED ORDER — ACETAMINOPHEN 650 MG RE SUPP: 650.0000 mg | Freq: Four times a day (QID) | RECTAL | Status: DC | PRN

## 2012-10-13 MED ORDER — HEPARIN SODIUM (PORCINE) 5000 UNIT/ML IJ SOLN
5000.0000 [IU] | Freq: Three times a day (TID) | INTRAMUSCULAR | Status: DC
  Administered 2012-10-13: 5000 [IU] via SUBCUTANEOUS
  Filled 2012-10-13 (×10): qty 1

## 2012-10-13 MED ORDER — GI COCKTAIL ~~LOC~~
30.0000 mL | Freq: Three times a day (TID) | ORAL | Status: DC | PRN
  Administered 2012-10-13: 30 mL via ORAL
  Filled 2012-10-13 (×2): qty 30

## 2012-10-13 MED ORDER — PANTOPRAZOLE SODIUM 40 MG PO TBEC
40.0000 mg | DELAYED_RELEASE_TABLET | Freq: Every day | ORAL | Status: DC
  Administered 2012-10-13 – 2012-10-15 (×2): 40 mg via ORAL
  Filled 2012-10-13 (×2): qty 1

## 2012-10-13 MED ORDER — SERTRALINE HCL 100 MG PO TABS
200.0000 mg | ORAL_TABLET | Freq: Every day | ORAL | Status: DC
  Administered 2012-10-13 – 2012-10-15 (×2): 200 mg via ORAL
  Filled 2012-10-13 (×3): qty 2

## 2012-10-13 MED ORDER — POTASSIUM CHLORIDE CRYS ER 20 MEQ PO TBCR
20.0000 meq | EXTENDED_RELEASE_TABLET | Freq: Once | ORAL | Status: AC
  Administered 2012-10-13: 20 meq via ORAL
  Filled 2012-10-13: qty 1

## 2012-10-13 MED ORDER — ONDANSETRON HCL 4 MG/2ML IJ SOLN: 4.0000 mg | Freq: Four times a day (QID) | INTRAMUSCULAR | Status: DC | PRN

## 2012-10-13 MED ORDER — MORPHINE SULFATE 2 MG/ML IJ SOLN
2.0000 mg | INTRAMUSCULAR | Status: DC | PRN
  Administered 2012-10-14 – 2012-10-15 (×4): 2 mg via INTRAVENOUS
  Filled 2012-10-13 (×4): qty 1

## 2012-10-13 MED ORDER — ONDANSETRON HCL 4 MG PO TABS: 4.0000 mg | ORAL_TABLET | Freq: Four times a day (QID) | ORAL | Status: DC | PRN

## 2012-10-13 MED ORDER — TECHNETIUM TC 99M MEBROFENIN IV KIT
5.0000 | PACK | Freq: Once | INTRAVENOUS | Status: AC | PRN
  Administered 2012-10-13: 5 via INTRAVENOUS

## 2012-10-13 MED ORDER — DEXTROSE 50 % IV SOLN: 25.0000 mL | Freq: Once | INTRAVENOUS | Status: AC | PRN

## 2012-10-13 NOTE — H&P (Signed)
I have seen and examined this patient. I have discussed with Dr Elwyn Reach and Dr Althea Charon.  I agree with their findings and plans as documented in their admission notes.  Acute Issues 1. Acute RUQ/Epigastric Pain - Constant, worse with lying - Pian has woken patient from sleep. - No lower or upper bowel symptoms.  No melena/hematochezia. No bladder symptoms. No pelvic organ symptoms. No infectious symptoms. (+) mutual monogamous relationship. No respiratory symptoms.  - No significant alcohol or NSAID intake - (+) tobacco - DMT1 on insulin pump.  - Normal hepatic labs, Upreg neg. Urinalysis unremarkable except for >1000 glucose - Elevated WBC - Unremarkable recent abdominal US and abdominopelvic CT imaging  Differential includes : occult Biliary process, PUD colitis, GI motility disturbance in diabetic patient.   Plan: HIDA scan, if unremarkable, consult GI service for consideration of Endoscopic evaluations.  Consider Nuclear emptying study if other evaluations not diagnostic.  Start PPI at full dose.  Hemoccult stool x 3 Start opiate analgesic once HIDA completed.

## 2012-10-13 NOTE — Progress Notes (Signed)
Pt declines use of SCD hose and heparin SQ.  Pt states she will get oob and do "a lot of walking."

## 2012-10-13 NOTE — ED Notes (Signed)
Patient transported to X-ray 

## 2012-10-13 NOTE — ED Notes (Signed)
Family practice at bedside.

## 2012-10-13 NOTE — Significant Event (Addendum)
Hypoglycemic Event  CBG: 66  Treatment: D50 IV 25 mL  Symptoms: None  Follow-up CBG: Time:0325 CBG Result122  Possible Reasons for Event: Unknown  Comments/MD notified:yes- the actual D50 dose was 10cc per pt request-  Unable to change the result above from 25 to 10    Dennard Schaumann H  Remember to initiate Hypoglycemia Order Set & complete

## 2012-10-13 NOTE — Progress Notes (Signed)
Inpatient Diabetes Program Recommendations  AACE/ADA: New Consensus Statement on Inpatient Glycemic Control (2013)  Target Ranges:  Prepandial:   less than 140 mg/dL      Peak postprandial:   less than 180 mg/dL (1-2 hours)      Critically ill patients:  140 - 180 mg/dL   Reason for Visit: Consult regarding insulin pump  Note:  Patient receptive to my visit.  Denies any problems associated with the insulin pump this admission.  Hgb A1C is 7.8-- Patient states that it is usually better than that.  Note CBG a little low early this morning-- but otherwise CBGs acceptable.           Insulin pump basal settings are as follows:            12 mn = 0.8 unit/hr  0500 = 0.8 unit/hr  0600 = 0.85 unit/hr  0900 = 0.6 unit/hr  1500 = 0.6 unit/hr  1800 = 0.55 unit/hr  1900 = 0.4 unit/hr Total basal insulin in 24 hours = 15.3  Insulin to CHO ratio is 1:10  Sensitivity factor is 30  Please page Diabetes Coordinator on call from [phone] to [phone] at [phone] with any questions/concerns related to insulin pump while in the hospital.  Thank you.  Ruchi Stoney S. Elsie Lincoln, RN, CNS, CDE Inpatient Diabetes Program, team pager [phone]

## 2012-10-13 NOTE — Discharge Summary (Signed)
Family Medicine Teaching [facility] Discharge Summary  Patient name: Sierra Schneider Medical record number: [id number] Date of birth: September 03, 1985 Age: 27 y.o. Gender: female Date of Admission: 10/12/2012  Date of Discharge: 10/15/2012 Admitting Physician: Leighton Roach McDiarmid, MD  Primary Care Provider: Mat Carne, MD Consultants: GI  Indication for Hospitalization: Abdominal pain (RUQ, epigastric)  Discharge Diagnoses/Problem List: Duodenal Ulcer Abdominal pain (RUQ / epigastric), secondary to Duodenal Ulcer - improved Type 1 IDDM, with insulin pump - stable Hypokalemia - resolved Depression - stable  Disposition: Home  Discharge Condition: Stable  Brief Hospital Course:  Sierra Schneider is a 27 y.o. year old female presenting with persistent abdominal pain (RUQ/epigastric) worsening over past 5-6 days, worse with pressure or laying flat/standing up straight. No associated fevers, nausea/vomiting, diarrhea/constipation. Has not tried any pain or antacid meds. Surg Hx +C-section, no abd surgeries. PMH is significant for Type 1 DM (IDDM) w/ insulin pump, Depression, Mirena IUD.  # Abdominal Pain, RUQ / epigastric, secondary to Duodenal Ulcer - Improved Patient presented with persistent RUQ / epigastric abd pain for about 1 week w/o associated concerning symptoms (afebrile, no nausea / vomiting / bloody stools / anorexia), except very mild leukocytosis. Started on PPI, pain controlled with morphine IV. Initially work-up including current hospitalization and previous ED visits was negative (Abd [place], CT Abd/Pelvis, CMET, LFTs, Hida Scan). Due to negative work-up and risk factors for PUD (+smoker, +EtOH, hx of NSAID use) consulted GI for EGD, which showed Duodenal Ulcer w/o active signs of bleeding, pending final results of biopsy for H. Pylori. However, H. Pylori IgG tested earlier was negative. Discharged patient on Protonix 40mg  daily, Tramadol in short-term for pain, counseled on  quitting / decreasing smoking, EtOH, stop NSAID use, reduce stress, and scheduled follow-up with Dr. Randa Evens [facility]) in 2 months.  # Duodenal Ulcer - See above description. - Pending EGD biopsy for H. Pylori  # Type 1 DM (IDDM), s/p insulin pump - stable Followed by Endocrine (Dr. Lucianne Muss), on insulin pump (< 1 yr), reported good compliance. HgbA1c 7.8.  # Hypokalemia - resolved  Mild hypokalemia with K 3.4, resolved upon initial repletion. Stable, normal range.  # Depression - stable Continued home Zoloft 200mg  daily.  Issues for Follow Up: Response to PPI Therapy - started Protonix 40mg  daily, due to negative H. Pylori IgG did not initiate quadruple therapy with antibiotics. If response not optimal, would consider increasing PPI or holding temporarily to re-test for H. Pylori.  PUD Risk Factors / Lifestyle Mods - Continue counseling on quitting / decreasing smoking and EtOH. Advise to stop or limit NSAIDs, reduce stress. Provided information on modified diet.  EGD Biopsy Results - EGD performed (8/19) with ulcer biopsies taken, pending final results if +H. Pylori. Follow-up with Dr. Randa Evens [facility]). Has appointment scheduled 12/17/12.  Significant Procedures: 8/19 EGD (Dr. Randa Evens) - Duodenal Ulcer w/o active bleeding - pending biopsy  Significant Labs and Imaging:   Recent Labs Lab 10/12/12 1610 10/13/12 0700 10/15/12 0603  WBC 13.1* 11.4* 8.9  HGB 13.6 12.2 12.0  HCT 39.8 35.4* 35.9*  PLT 267 245 245    Recent Labs Lab 10/09/12 1853 10/12/12 1610 10/13/12 0700 10/15/12 0603  NA 134* 137 140 139  K 3.9 3.4* 4.0 3.8  CL 100 98 105 104  CO2 28 28 28 27   GLUCOSE 280* 385* 88 127*  BUN 10 7 6 7   CREATININE 0.59 0.50 0.59 0.55  CALCIUM 8.9 9.6 8.6 8.5  ALKPHOS  63 78 56 56  AST 14 13 19 14   ALT 14 14 16 14   ALBUMIN 3.7 4.1 3.4* 3.0*   HgbA1c - 7.8  8/17 Upreg - negative  8/17 UA - negative  Imaging/Diagnostic Tests:  8/14 Abd [place] - negative  8/17 CT  Abd-Pelvis - Unremarkable  8/18 2v CXR - negative  8/18 HIDA Scan  Findings: Following the intravenous administration of the  radiopharmaceutical there is uniform tracer uptake by the liver  with clearance from blood pool. Radiotracer accumulation within  the gallbladder occurs within 10 minutes. There is radiotracer  activity within small bowel loops by 25 minutes. The gallbladder  ejection fraction is calculated to be equal 45.2%.  IMPRESSION:  1. Patent cystic duct without evidence for acute cholecystitis.  2. Normal gallbladder ejection fraction.  Outstanding Results: EGD Biopsy for H. Pylori - pending  Discharge Medications:    Medication List         insulin aspart 100 UNIT/ML injection  Commonly known as:  NOVOLOG  Inject 40-50 Units into the skin continuous. Per insulin pump     pantoprazole 40 MG tablet  Commonly known as:  PROTONIX  Take 1 tablet (40 mg total) by mouth daily.     sertraline 50 MG tablet  Commonly known as:  ZOLOFT  Take 4 tablets (200 mg total) by mouth daily.     traMADol 50 MG tablet  Commonly known as:  ULTRAM  Take 1 tablet (50 mg total) by mouth every 6 (six) hours as needed for pain.        Discharge Instructions: Please refer to Patient Instructions section of EMR for full details.  Patient was counseled important signs and symptoms that should prompt return to medical care, changes in medications, dietary instructions, activity restrictions, and follow up appointments.   Follow-Up Appointments: Follow-up Information   Follow up with Mat Carne, MD On 10/24/2012. (Appointment at 9:45 AM)    Specialty:  Family Medicine   Contact information:   [address] [place] [address] [phone]       Follow up with Vertell Novak, MD. [facility]: Apt scheduled for 12/17/12 at 1:30pm)    Specialty:  Gastroenterology   Contact information:   [address]                         [address] [place] [address] [phone]       Saralyn Pilar, DO 10/15/2012, 5:19 PM PGY-1, [facility] Family Medicine

## 2012-10-13 NOTE — ED Provider Notes (Signed)
CSN: [id number]     Arrival date & time 10/12/12  2027 History     First MD Initiated Contact with Patient 10/12/12 2356     Chief Complaint  Patient presents with  . Abdominal Pain   (Consider location/radiation/quality/duration/timing/severity/associated sxs/prior Treatment) HPI 27 yo female presents to the ER from home for admission.  Pt was seen earlier today at [facility] and recommended admission due to persistent RUQ/epigastric pain for 5 days and rising WBC.  Pt has been seen at [facility] on Thursday with normal labs and u/s.  She had CT scan today and labs essentially normal aside from increased wbc.  Pain is constant, dull.  No change with eating.  No vomiting.  Pain with pressure over the area, has been keeping her pants unbuttoned.  Pain with standing up straight.  Patient was discussed with surgery, Dr Gerrit Friends who recommended medical admission with further workup.  Pt declined transport by [facility], preferred to take care of things at home.  No change in her condition from her last visit.  Past Medical History  Diagnosis Date  . Diabetes mellitus 08/2009    late onset type 1   Past Surgical History  Procedure Laterality Date  . Cesarean section     Family History  Problem Relation Age of Onset  . Depression Mother   . Depression Father   . Kidney disease Paternal Grandmother    History  Substance Use Topics  . Smoking status: Current Every Day Smoker -- 1.00 packs/day    Types: Cigarettes  . Smokeless tobacco: Not on file  . Alcohol Use: Yes     Comment: 1 drink every 3 months   OB History   Grav Para Term Preterm Abortions TAB SAB Ect Mult Living                 Review of Systems  All other systems reviewed and are negative.    Allergies  Review of patient's allergies indicates no known allergies.  Home Medications   Current Outpatient Rx  Name  Route  Sig  Dispense  Refill  . insulin aspart (NOVOLOG) 100 UNIT/ML injection   Subcutaneous   Inject 40-50 Units  into the skin continuous. Per insulin pump   3 vial   3   . sertraline (ZOLOFT) 50 MG tablet   Oral   Take 4 tablets (200 mg total) by mouth daily.   120 tablet   5     Patient needs to schedule appointment with her doc ...    BP 108/77  Pulse 74  Temp(Src) 98.3 F (36.8 C) (Oral)  Resp 16  SpO2 97% Physical Exam  Nursing note and vitals reviewed. Constitutional: She is oriented to person, place, and time. She appears well-developed and well-nourished.  HENT:  Head: Normocephalic and atraumatic.  Nose: Nose normal.  Mouth/Throat: Oropharynx is clear and moist.  Eyes: Conjunctivae and EOM are normal. Pupils are equal, round, and reactive to light.  Neck: Normal range of motion. Neck supple. No JVD present. No tracheal deviation present. No thyromegaly present.  Cardiovascular: Normal rate, regular rhythm, normal heart sounds and intact distal pulses.  Exam reveals no gallop and no friction rub.   No murmur heard. Pulmonary/Chest: Effort normal and breath sounds normal. No stridor. No respiratory distress. She has no wheezes. She has no rales. She exhibits no tenderness.  Abdominal: Soft. Bowel sounds are normal. She exhibits no distension and no mass. There is tenderness (RUQ). There is no rebound and  no guarding.  Musculoskeletal: Normal range of motion. She exhibits no edema and no tenderness.  Lymphadenopathy:    She has no cervical adenopathy.  Neurological: She is alert and oriented to person, place, and time. She exhibits normal muscle tone. Coordination normal.  Skin: Skin is warm and dry. No rash noted. No erythema. No pallor.  Psychiatric: She has a normal mood and affect. Her behavior is normal. Judgment and thought content normal.    ED Course   Procedures (including critical care time)  Labs Reviewed - No data to display Ct Abdomen Pelvis W Contrast  10/12/2012   *RADIOLOGY REPORT*  Clinical Data: Abdominal pain.  CT ABDOMEN AND PELVIS WITH CONTRAST   Technique:  Multidetector CT imaging of the abdomen and pelvis was performed following the standard protocol during bolus administration of intravenous contrast.  Contrast: OMNIPAQUE IOHEXOL 300 MG/ML  SOLN, 50mL OMNIPAQUE IOHEXOL 300 MG/ML  SOLN  Comparison: Ultrasound 10/09/2012.  CT 07/06/2010.  Findings: Lung bases are clear.  No effusions.  Heart is normal size.  Liver, gallbladder, spleen, pancreas, adrenals and kidneys and are normal.  IUD is in place within the uterus.  Small cyst or follicle in the right ovary.  Adnexa unremarkable.  Appendix is visualized and is normal. Bowel grossly unremarkable.  No free fluid, free air, or adenopathy.  No acute bony abnormality.  IMPRESSION: Unremarkable study.   Original Report Authenticated By: Charlett Nose, M.D.   1. RUQ abdominal pain     MDM  27 yo female with persistent RUQ pain, to be admitted.  D/w [facility] residents who will see in the ED.  Olivia Mackie, MD 10/13/12 [id number]

## 2012-10-13 NOTE — H&P (Signed)
Family Medicine Teaching [facility] Admission History and Physical Service Pager: [phone]  Patient name: Sierra Schneider Medical record number: [id number] Date of birth: Apr 28, 1985 Age: 27 y.o. Gender: female  Primary Care Provider: Mat Carne, MD Consultants: none Code Status: Full  Chief Complaint: Abdominal Pain (RUQ, epigastric)  Assessment and Plan: Sierra Schneider is a 27 y.o. year old female presenting with persistent abdominal pain (RUQ/epigastric) worsening over past 5 days, worse with pressure or laying flat/standing up straight. No associated fevers, nausea/vomiting, diarrhea/constipation. Has not tried any pain or antacid meds. Surg Hx +C-section, no abd surgeries. PMH is significant for Type 1 DM (IDDM) w/ insulin pump, Depression, Mirena IUD.  # Abdominal Pain, RUQ / epigastric - Reportedly woke up w/ abd pain, gradually worsening x5 days, no fever, n/v/d/c, +appetite (decreased?) questionable if food makes worse, position makes pain worse (laying flat, standing straight) - Recent hx: seen at [facility] (8/14) - abd [place] / testing - left AMA w/o results, [facility] (8/17) - transferred here Diff Dx: possible gallbladder dysfxn vs possible gastric ulcer / gastritis vs unlikely hepatitis or hepatic etiology vs could consider intestinal ischemia but this is very unlikely as at this point she would probably have some necrotic bowel vs MSK etiology Gallbladder - +RUQ, Abd [place] negative for gallstones, CT unremarkable, possible dysfunction w/o stones Gastric/Duodenal ulcer / gastritis - +epigastric, +smoker +EtOH, no reported use NSAIDs (per records prior hx back pain, headaches w/ use), no hematemesis / blood in stool, questionable food worsens pain, no prior antacid use or trial Hepatic Etiology - +RUQ, no fever, no hx IV drug use, +tattoos - elevated WBC on admission 13.1 (11.9) - trend with CBCs [ ]  currently NPO - pending Hida Scan in AM (assess if gallbladder  dysfunction) [ ]  consider GI c/s for possible EGD tomorrow - IF Hida negative, and symptoms persist / worsen - PRN GI cocktail, Zofran - started Protonix 40mg  daily [ ]  continue to monitor abd pain - (improve w/ GI cocktail?)  # Type 1 DM (IDDM), s/p insulin pump - Followed by Endocrine (Dr. Lucianne Muss), on insulin pump (< 1 yr) - no record of recent HgbA1c, per records well-controlled, compliant, frequent CBG checks [ ]  f/u HgbA1c [ ]  c/s DM Coordinator - management recs w/ Insulin Pump  # Hypokalemia - measured 3.4 on admission - given KCl tab x1 [ ]  f/u BMET - trend K+  # Depression - stable, continued home Zoloft 200mg  daily  FEN/GI: 0.45 NS 75cc/hr / mild hypokalemia (3.4) given 20 KCl / NPO - pending Hida Scan in AM / Protonix 40mg  daily Prophylaxis: Heparin SubQ  Disposition: Home, pending clinical course  History of Present Illness: Sierra Schneider is a 27 y.o. year old female presenting with persistent abdominal pain (RUQ / epigastric) worsening over past 5 days. She reports that she initially woke up with the pain, and had no complaints prior to going to sleep. Described as "aching pain" (not cramping) 8/10, constant w/o relief, no radiation or assoc symptoms (no fevers, nausea / vomiting / diarrhea / constipation). Pain has gradually gotten worse with certain positions (laying flat, standing straight, walking) and improves with hunching over and sitting up in bed with legs curled. Reports still has appetite, but unsure if food makes pain worse.  She denies any pain that radiates to the groin.  No urinary symptoms of hematuria or dysuria.   Has not tried any analgesic or antacid medications since onset of pain. Despite normal  bowel habits, tried laxative Thursday night with resulting loose stool and no change in abd pain. Recently seen at [facility] ED (8/14) for same pain, work-up included Abd [place] and blood work but pt did not stay for results (had to pick up daughter from  daycare). On 8/17 went to [facility] ED for continued abd pain, contacted [facility] FM inpatient service for admission but patient declined direct admission and went home, planned to arrive to [facility] ED on own.  Surgical hx - C-section 2010. No abdominal surgeries noted.  Review Of Systems: Per HPI with the following additions: Denies fever/chills, CP, SoB, nausea / vomiting, constipation / diarrhea, blood in stool, dysuria, hematuria, flank pain.  Otherwise 12 point review of systems was performed and was unremarkable.  Patient Active Problem List   Diagnosis Date Noted  . Epigastric pain 10/13/2012  . Back pain 05/19/2012  . Headache 01/31/2012  . Hematuria 12/10/2011  . URI (upper respiratory infection) 11/23/2011  . Pelvic pain in female 11/12/2011  . Neck pain 11/09/2011  . Bacterial vaginosis 11/09/2011  . Cough 06/13/2011  . Tobacco abuse counseling 04/04/2011  . Breast nodule 04/04/2011  . Depression 12/01/2010  . Health counseling 12/01/2010  . DIABETES MELLITUS, TYPE I 09/14/2009   Past Medical History: Past Medical History  Diagnosis Date  . Diabetes mellitus 08/2009    late onset type 1   Past Surgical History: Past Surgical History  Procedure Laterality Date  . Cesarean section    . No past surgeries     Social History: History  Substance Use Topics  . Smoking status: Current Every Day Smoker -- 1.00 packs/day    Types: Cigarettes  . Smokeless tobacco: Not on file  . Alcohol Use: Yes     Comment: 1 drink every 3 months  Reports drinking "several" beers every other weekend.  Additional social history: none  Please also refer to relevant sections of EMR.  Family History: Family History  Problem Relation Age of Onset  . Depression Mother   . Depression Father   . Kidney disease Paternal Grandmother    Allergies and Medications: No Known Allergies No current facility-administered medications on file prior to encounter.   Current Outpatient  Prescriptions on File Prior to Encounter  Medication Sig Dispense Refill  . insulin aspart (NOVOLOG) 100 UNIT/ML injection Inject 40-50 Units into the skin continuous. Per insulin pump  3 vial  3  . sertraline (ZOLOFT) 50 MG tablet Take 4 tablets (200 mg total) by mouth daily.  120 tablet  5    Objective: BP 109/64  Pulse 68  Temp(Src) 99.2 F (37.3 C) (Oral)  Resp 16  Ht 5\' 2"  (1.575 m)  Wt 121 lb 6.4 oz (55.067 kg)  BMI 22.2 kg/m2  SpO2 99% Exam: General: sitting up in bed w/ legs curled, mild distress (due to abd pain) HEENT: PERRLA, EOMI, MMM, w/o scleral icterus Cardiovascular: RRR, no murmurs Respiratory: CTAB w/o wheeze, crackles. Good resp effort Abdomen: soft, +tender to light/deep palpation (worse RUQ, epigastric, generalized mild tenderness), no guarding or rebound, no masses, McBurney (-), no inc pain w/ bed shaking Extremities: no edema, non-tender, moves all ext, +2 peripheral pulses Skin: warm, dry Neuro: grossly non-focal, muscle str normal, intact sensation to light touch, gait not tested  Labs and Imaging: CBC BMET   Recent Labs Lab 10/12/12 1610  WBC 13.1*  HGB 13.6  HCT 39.8  PLT 267    Recent Labs Lab 10/12/12 1610  NA 137  K 3.4*  CL 98  CO2 28  BUN 7  CREATININE 0.50  GLUCOSE 385*  CALCIUM 9.6     8/17 Upreg - negative 8/17 UA - negative  8/14 Abd [place] - negative 8/17 CT Abd-Pelvis - Unremarkable 8/18 2v CXR - negative 8/18 Hida Scan - pending  Saralyn Pilar, DO 10/13/2012, 3:05 AM PGY-1, [facility] FPTS Intern pager: [phone], text pages welcome  PGY-3 Addendum I have seen and examined this patient. I agree with the above note with my edits in pink.  BOOTH, ERIN 10/13/2012, 6:02 AM

## 2012-10-14 ENCOUNTER — Encounter (HOSPITAL_COMMUNITY)
Admission: EM | Disposition: A | Discharge status: Home / Self Care | Payer: Self-pay | Source: Home / Self Care | Attending: Family Medicine

## 2012-10-14 ENCOUNTER — Encounter (HOSPITAL_COMMUNITY): Payer: Self-pay | Admitting: Gastroenterology

## 2012-10-14 DIAGNOSIS — F3289 Other specified depressive episodes: Secondary | ICD-10-CM

## 2012-10-14 DIAGNOSIS — R1013 Epigastric pain: Secondary | ICD-10-CM

## 2012-10-14 DIAGNOSIS — K269 Duodenal ulcer, unspecified as acute or chronic, without hemorrhage or perforation: Secondary | ICD-10-CM | POA: Diagnosis present

## 2012-10-14 DIAGNOSIS — Z7189 Other specified counseling: Secondary | ICD-10-CM

## 2012-10-14 DIAGNOSIS — F329 Major depressive disorder, single episode, unspecified: Secondary | ICD-10-CM

## 2012-10-14 DIAGNOSIS — F17200 Nicotine dependence, unspecified, uncomplicated: Secondary | ICD-10-CM

## 2012-10-14 HISTORY — PX: ESOPHAGOGASTRODUODENOSCOPY: SHX5428

## 2012-10-14 LAB — HEMOGLOBIN A1C: Hgb A1c MFr Bld: 7.8 % — ABNORMAL HIGH (ref <5.7)

## 2012-10-14 LAB — GLUCOSE, CAPILLARY
Glucose-Capillary: 98 mg/dL (ref 70–99)
Glucose-Capillary: 249 mg/dL — ABNORMAL HIGH (ref 70–99)

## 2012-10-14 SURGERY — EGD (ESOPHAGOGASTRODUODENOSCOPY)
Anesthesia: Moderate Sedation | Laterality: Left

## 2012-10-14 MED ORDER — FENTANYL CITRATE 0.05 MG/ML IJ SOLN
INTRAMUSCULAR | Status: AC
  Filled 2012-10-14: qty 2

## 2012-10-14 MED ORDER — DOCUSATE SODIUM 100 MG PO CAPS: 100.0000 mg | ORAL_CAPSULE | Freq: Two times a day (BID) | ORAL | Status: DC | PRN

## 2012-10-14 MED ORDER — SENNA 8.6 MG PO TABS
1.0000 | ORAL_TABLET | Freq: Every day | ORAL | Status: DC | PRN
  Administered 2012-10-15: 8.6 mg via ORAL
  Filled 2012-10-14: qty 1

## 2012-10-14 MED ORDER — SODIUM CHLORIDE 0.9 % IV SOLN: INTRAVENOUS | Status: DC

## 2012-10-14 MED ORDER — BUTAMBEN-TETRACAINE-BENZOCAINE 2-2-14 % EX AERO
INHALATION_SPRAY | CUTANEOUS | Status: DC | PRN
  Administered 2012-10-14: 2 via TOPICAL

## 2012-10-14 MED ORDER — POLYETHYLENE GLYCOL 3350 17 G PO PACK: 17.0000 g | PACK | Freq: Every day | ORAL | Status: DC | PRN

## 2012-10-14 MED ORDER — MIDAZOLAM HCL 5 MG/ML IJ SOLN
INTRAMUSCULAR | Status: AC
  Filled 2012-10-14: qty 2

## 2012-10-14 MED ORDER — FENTANYL CITRATE 0.05 MG/ML IJ SOLN
INTRAMUSCULAR | Status: DC | PRN
  Administered 2012-10-14 (×3): 25 ug via INTRAVENOUS

## 2012-10-14 MED ORDER — MIDAZOLAM HCL 10 MG/2ML IJ SOLN
INTRAMUSCULAR | Status: DC | PRN
  Administered 2012-10-14 (×4): 2 mg via INTRAVENOUS

## 2012-10-14 MED ORDER — DIPHENHYDRAMINE HCL 50 MG/ML IJ SOLN
INTRAMUSCULAR | Status: AC
  Filled 2012-10-14: qty 1

## 2012-10-14 MED ORDER — DIPHENHYDRAMINE HCL 50 MG/ML IJ SOLN
INTRAMUSCULAR | Status: DC | PRN
  Administered 2012-10-14: 25 mg via INTRAVENOUS

## 2012-10-14 NOTE — Interval H&P Note (Signed)
History and Physical Interval Note:  10/14/2012 12:56 PM  Sierra Schneider  has presented today for surgery, with the diagnosis of abdominal pain  The various methods of treatment have been discussed with the patient and family. After consideration of risks, benefits and other options for treatment, the patient has consented to  Procedure(s): ESOPHAGOGASTRODUODENOSCOPY (EGD) (Left) as a surgical intervention .  The patient's history has been reviewed, patient examined, no change in status, stable for surgery.  I have reviewed the patient's chart and labs.  Questions were answered to the patient's satisfaction.     Hamza Empson JR,Laiah Pouncey L

## 2012-10-14 NOTE — H&P (View-Only) (Signed)
[facility] Consultation Note  Referring Provider: Dr. Todd McDiarmid (Family Medicine) Primary Care Physician:  WILLIAMSON, EDWARD, MD  Reason for Consultation:  Abdominal pain  HPI: Sierra Schneider is a 27 y.o. female admitted for, and whom we've been asked to see because of, abdominal pain.  Started one week ago, is constant and dull.  Seems worse after moving around.  No clear exacerbation after eating.  No nausea or vomiting.  No NSAIDs.  Pain is gradually improving.  No radiation of pain.  No early satiety.  No GERD, dysphagia, blood in stool.  CT abdomen/pelvis, U/S, HIDA, labs all unrevealing except for mild leukocytosis.   Past Medical History  Diagnosis Date  . Diabetes mellitus 08/2009    late onset type 1    Past Surgical History  Procedure Laterality Date  . Cesarean section    . No past surgeries      Prior to Admission medications   Medication Sig Start Date End Date Taking? Authorizing Provider  insulin aspart (NOVOLOG) 100 UNIT/ML injection Inject 40-50 Units into the skin continuous. Per insulin pump 10/06/12  Yes Ajay Kumar, MD  sertraline (ZOLOFT) 50 MG tablet Take 4 tablets (200 mg total) by mouth daily. 07/15/12  Yes Edward V Williamson, MD    Current Facility-Administered Medications  Medication Dose Route Frequency Provider Last Rate Last Dose  . 0.45 % sodium chloride infusion   Intravenous Continuous Erin J Honig, MD 75 mL/hr at 10/13/12 0250    . acetaminophen (TYLENOL) tablet 650 mg  650 mg Oral Q6H PRN Erin J Honig, MD   650 mg at 10/13/12 1613   Or  . acetaminophen (TYLENOL) suppository 650 mg  650 mg Rectal Q6H PRN Erin J Honig, MD      . docusate sodium (COLACE) capsule 100 mg  100 mg Oral BID PRN Alexander Karamalegos, DO      . gi cocktail (Maalox,Lidocaine,Donnatal)  30 mL Oral TID PRN Erin J Honig, MD   30 mL at 10/13/12 0842  . heparin injection 5,000 Units  5,000 Units Subcutaneous Q8H Erin J Honig, MD   5,000 Units at 10/13/12 0614   . HYDROcodone-acetaminophen (NORCO/VICODIN) 5-325 MG per tablet 1 tablet  1 tablet Oral Q4H PRN Alexander Karamalegos, DO   1 tablet at 10/13/12 1519  . insulin pump   Subcutaneous TID AC, HS, 0200 Erin J Honig, MD      . morphine 2 MG/ML injection 2 mg  2 mg Intravenous Q4H PRN Alexander Karamalegos, DO   2 mg at 10/14/12 0914  . ondansetron (ZOFRAN) tablet 4 mg  4 mg Oral Q6H PRN Erin J Honig, MD       Or  . ondansetron (ZOFRAN) injection 4 mg  4 mg Intravenous Q6H PRN Erin J Honig, MD      . pantoprazole (PROTONIX) EC tablet 40 mg  40 mg Oral Daily Erin J Honig, MD   40 mg at 10/13/12 1617  . senna (SENOKOT) tablet 8.6 mg  1 tablet Oral Daily PRN Alexander Karamalegos, DO      . sertraline (ZOLOFT) tablet 200 mg  200 mg Oral Daily Erin J Honig, MD   200 mg at 10/13/12 1617    Allergies as of 10/12/2012  . (No Known Allergies)    Family History  Problem Relation Age of Onset  . Depression Mother   . Depression Father   . Kidney disease Paternal Grandmother     History   Social History  .   Marital Status: Single    Spouse Name: N/A    Number of Children: N/A  . Years of Education: N/A   Occupational History  . Not on file.   Social History Main Topics  . Smoking status: Current Every Day Smoker -- 1.00 packs/day    Types: Cigarettes  . Smokeless tobacco: Not on file  . Alcohol Use: Yes     Comment: 1 drink every 3 months  . Drug Use: No  . Sexual Activity: Not on file     Comment: mirena   Other Topics Concern  . Not on file   Social History Narrative   Patient goes to school full-time at [employer]-studying business. Patient works part-time at [employer]. Is engaged. Having relationship difficulties. Has 2 children-ages 2 and 8.    Review of Systems: ROS Dr. Honig 10/13/12 reviewed and I agree  Physical Exam: Vital signs in last 24 hours: Temp:  [98.7 F (37.1 C)-99 F (37.2 C)] 98.7 F (37.1 C) (08/19 0500) Pulse Rate:  [71-83] 83 (08/19 0500) Resp:   [16-18] 16 (08/19 0500) BP: (97-110)/(53-70) 97/53 mmHg (08/19 0500) SpO2:  [100 %] 100 % (08/19 0500) Last BM Date: 10/12/12 General:   Alert,  Well-developed, well-nourished, pleasant and cooperative in NAD Head:  Normocephalic and atraumatic. Eyes:  Sclera clear, no icterus.   Conjunctiva pink. Ears:  Normal auditory acuity. Nose:  No deformity, discharge,  or lesions. Mouth:  No deformity or lesions.  Oropharynx pink & moist. Neck:  Supple; no masses or thyromegaly. Lungs:  Clear throughout to auscultation.   No wheezes, crackles, or rhonchi. No acute distress. Heart:  Regular rate and rhythm; no murmurs, clicks, rubs,  or gallops. Abdomen:  Soft, non distended, mild epigastric and right upper quadrant tenderness. No masses, hepatosplenomegaly or hernias noted. Normal bowel sounds, without guarding, and without rebound.     Msk:  Symmetrical without gross deformities. Normal posture. Pulses:  Normal pulses noted. Extremities:  Without clubbing or edema. Neurologic:  Alert and  oriented x4;  grossly normal neurologically. Skin:  Intact without significant lesions or rashes. Psych:  Alert and cooperative. Normal mood and affect.   Lab Results:  Recent Labs  10/12/12 1610 10/13/12 0700  WBC 13.1* 11.4*  HGB 13.6 12.2  HCT 39.8 35.4*  PLT 267 245   BMET  Recent Labs  10/12/12 1610 10/13/12 0700  NA 137 140  K 3.4* 4.0  CL 98 105  CO2 28 28  GLUCOSE 385* 88  BUN 7 6  CREATININE 0.50 0.59  CALCIUM 9.6 8.6   LFT  Recent Labs  10/13/12 0700  PROT 6.0  ALBUMIN 3.4*  AST 19  ALT 16  ALKPHOS 56  BILITOT 0.3   PT/INR No results found for this basename: LABPROT, INR,  in the last 72 hours  Studies/Results: Dg Chest 2 View  10/13/2012   *RADIOLOGY REPORT*  Clinical Data: The epigastric pain.  CHEST - 2 VIEW  Comparison: 07/11/2008  Findings: Normal heart size and pulmonary vascularity.  No focal airspace consolidation in the lungs.  No blunting of costophrenic  angles.  No pneumothorax.  Mediastinal contours appear intact.  No significant changes since the previous study.  IMPRESSION: No evidence of active pulmonary disease.   Original Report Authenticated By: Jacier Gladu Stevens, M.D.   Ct Abdomen Pelvis W Contrast  10/12/2012   *RADIOLOGY REPORT*  Clinical Data: Abdominal pain.  CT ABDOMEN AND PELVIS WITH CONTRAST  Technique:  Multidetector CT imaging of the abdomen   and pelvis was performed following the standard protocol during bolus administration of intravenous contrast.  Contrast: 100mL OMNIPAQUE IOHEXOL 300 MG/ML  SOLN, 50mL OMNIPAQUE IOHEXOL 300 MG/ML  SOLN  Comparison: Ultrasound 10/09/2012.  CT 07/06/2010.  Findings: Lung bases are clear.  No effusions.  Heart is normal size.  Liver, gallbladder, spleen, pancreas, adrenals and kidneys and are normal.  IUD is in place within the uterus.  Small cyst or follicle in the right ovary.  Adnexa unremarkable.  Appendix is visualized and is normal. Bowel grossly unremarkable.  No free fluid, free air, or adenopathy.  No acute bony abnormality.  IMPRESSION: Unremarkable study.   Original Report Authenticated By: Kevin Dover, M.D.   Nm Hepato W/eject Fract  10/13/2012   *RADIOLOGY REPORT*  Clinical Data:  Right upper quadrant pain.  NUCLEAR MEDICINE HEPATOBILIARY IMAGING WITH GALLBLADDER EF  Technique:  Sequential images of the abdomen were obtained out to 60 minutes following intravenous administration of radiopharmaceutical. After oral ingestion of 8 ounces of ensure Plus, gallbladder ejection fraction was determined.  Radiopharmaceutical:  5.0 mCi Tc-99m Choletec  Comparison:   10/12/2012  Findings: Following the intravenous administration of the radiopharmaceutical there is uniform tracer uptake by the liver with clearance from blood pool.  Radiotracer accumulation within the gallbladder occurs within 10 minutes.  There is radiotracer activity within small bowel loops by 25 minutes.  The gallbladder ejection fraction  is calculated to be equal 45.2%.  IMPRESSION:  1.  Patent cystic duct without evidence for acute cholecystitis. 2.  Normal gallbladder ejection fraction.   Original Report Authenticated By: Taylor Stroud, M.D.   Impression:  1.  Abdominal pain.  Negative labs and imaging work-up.  Suspect musculoskeletal etiology.  Peptic ulcer can't be excluded, but doesn't seem likely.  Symptoms not consistent with gallbladder etiology or gastroparesis.  Plan:  1.  I have discussed with patient, and have offered her two options (1) watchful waiting (since symptoms are improving) versus (2) endoscopy to exclude ulcer disease.  Patient would like to go ahead with endoscopy. 2.  EGD scheduled for 12:45 this afternoon with Dr. Edwards.  If endoscopy is unrevealing, as I suspect, it would be OK from GI perspective to discharge home with outpatient primary care follow-up ([facility] can also see if desired). 3.  I don't see need for gastric emptying study at this time; I have canceled this test. 4.  Thank you for the consult.   LOS: 2 days   Willodene Stallings M  10/14/2012, 10:39 AM   

## 2012-10-14 NOTE — Progress Notes (Signed)
Supportive visit regarding insulin pump at bedside.  Patient just back from endoscopy.  Sierra Schneider S. Latasha Buczkowski, RN, CNS, CDE

## 2012-10-14 NOTE — Progress Notes (Signed)
Family Medicine Teaching Service Daily Progress Note Intern Pager: [phone]  Patient name: Sierra Schneider Medical record number: [id number] Date of birth: 03/24/1985 Age: 27 y.o. Gender: female  Primary Care Provider: Mat Carne, MD Consultants: GI Code Status: Full  Pt Overview and Major Events to Date:  8/18 - Negative work-up (Abd [place], CT Abd/Pelvis, HIDA) / Labs unremarkable 8/19 - c/s GI re: possible EGD (concern of PUD) / persistent RUQ pain  Assessment and Plan: Sierra Schneider is a 27 y.o. year old female presenting with persistent abdominal pain (RUQ/epigastric) worsening over past 5 days, worse with pressure or laying flat/standing up straight. No associated fevers, nausea/vomiting, diarrhea/constipation. Has not tried any pain or antacid meds. Surg Hx +C-section, no abd surgeries. PMH is significant for Type 1 DM (IDDM) w/ insulin pump, Depression, Mirena IUD.  # Abdominal Pain, RUQ / epigastric - persistent abd pain x6 days, no new assoc symptoms or concerning signs (afebrile, no n/v/d/c), decent appetite - previous work-up: negative imaging (Abd [place], CT abd/pelvis) / negative labs CMET - WBC trended down 11.4 (13.1) (8/18) HIDA Scan - negative (patent cystic duct w/ normal gallbladder EF) - r/o gallbladder dysfxn, stones, infxn - concern for PUD (risk factors +smoker, +EtOH) w/o hematemesis / melena (no acute GI blood loss) - continue Protonix 40mg  daily, received GI cocktail x1 (w/o improvement) [ ]  Pain Management: o/n Morphine 4mg  x1 with 2mg  q4 PRN [ ]  ordered FOBT (pending) [ ]  ordered NM Gastric Emptying study (notified by NM radiology that it needs to be >24 hrs since last NM scan (Hida), left order in place, as may still complete study 8/20 in AM pending GI recs this afternoon (will require NPO after midnight tonight) [ ]  f/u GI recs (8/19) (Dr. Randa Evens, [facility]) will see pt around 5pm today (RE: possible EGD?), will examine patient and determine what further  testing is necessary. Appreciate any further recs.   # Type 1 DM (IDDM), s/p insulin pump  - Followed by Endocrine (Dr. Lucianne Muss), on insulin pump (< 1 yr)  - no record of recent HgbA1c, per records well-controlled, compliant, frequent CBG checks  - HgbA1c 7.8 - DM Coordinator - management recs w/ Insulin Pump   - total basal insulin in 24 hrs = 15.3  # Hypokalemia - resolved - measured 3.4 on admission,given KCl tab x1 - improved to 4.0 [ ]  f/u BMET  # Depression  - stable, continued home Zoloft 200mg  daily  FEN/GI: 0.45 NS 75cc/hr / normal K+ / resumed carb-modified diet this am (was NPO after midnight) / Protonix 40mg  daily  Prophylaxis: refused Heparin SubQ and SCDs - pt elects for frequent ambulation  Disposition: Home pending improved clinical course  Subjective: Patient reports persistent RUQ / epigastric abd pain, but was able to mostly sleep through the night with Morphine 4mg  x1 at 10:15pm. She states that as long as she stays put in bed (HoB elevated, legs curled) she is comfortable, otherwise she is able to ambulate and movements do cause pain, but the pain seems to be at a plateau and is not getting better or worse. Discussed recent test results and plan to contact GI and pursue further work-up, possible EGD. She is agreeable to plan. NPO since midnight. Patient does express concern for wanting to go out and smoke, but refuses nicotine supplement. Expresses interest in leaving today, but would like further testing.  Objective: Temp:  [98.7 F (37.1 C)-99 F (37.2 C)] 98.7 F (37.1 C) (08/19 0500)  Pulse Rate:  [71-83] 83 (08/19 0500) Resp:  [16-18] 16 (08/19 0500) BP: (97-110)/(53-70) 97/53 mmHg (08/19 0500) SpO2:  [100 %] 100 % (08/19 0500) Physical Exam: General: awake, sitting up in bed w/ legs curled, NAD HEENT: PERRLA, EOMI, MMM, w/o scleral icterus  Cardiovascular: RRR, no murmurs  Respiratory: CTAB w/o wheeze, crackles. Good resp effort  Abdomen: soft,  +deep palpation (worse RUQ, epigastric, reduced generalized mild tenderness today), no guarding or rebound, no masses, McBurney (-), Murphy's (+ pain, but unclear if due to palpation or breathing, as pt able to take full inspiration), no inc pain w/ bed shaking  Extremities: no edema, non-tender, moves all ext, +2 peripheral pulses  Skin: warm, dry  Neuro: grossly non-focal, muscle str normal, intact sensation to light touch, gait not tested  Laboratory:  Recent Labs Lab 10/09/12 1853 10/12/12 1610 10/13/12 0700  WBC 11.9* 13.1* 11.4*  HGB 12.9 13.6 12.2  HCT 37.5 39.8 35.4*  PLT 276 267 245    Recent Labs Lab 10/09/12 1853 10/12/12 1610 10/13/12 0700  NA 134* 137 140  K 3.9 3.4* 4.0  CL 100 98 105  CO2 28 28 28   BUN 10 7 6   CREATININE 0.59 0.50 0.59  CALCIUM 8.9 9.6 8.6  PROT 6.4 6.9 6.0  BILITOT 0.2* 0.3 0.3  ALKPHOS 63 78 56  ALT 14 14 16   AST 14 13 19   GLUCOSE 280* 385* 88   HgbA1c - 7.8 FOBT - pending  8/17 Upreg - negative  8/17 UA - negative  Imaging/Diagnostic Tests:  8/14 Abd [place] - negative  8/17 CT Abd-Pelvis - Unremarkable  8/18 2v CXR - negative   8/18 HIDA Scan Findings: Following the intravenous administration of the  radiopharmaceutical there is uniform tracer uptake by the liver  with clearance from blood pool. Radiotracer accumulation within  the gallbladder occurs within 10 minutes. There is radiotracer  activity within small bowel loops by 25 minutes. The gallbladder  ejection fraction is calculated to be equal 45.2%.  IMPRESSION:  1. Patent cystic duct without evidence for acute cholecystitis.  2. Normal gallbladder ejection fraction.  Saralyn Pilar, DO 10/14/2012, 8:08 AM PGY-1, [facility] FPTS Intern pager: [phone], text pages welcome

## 2012-10-14 NOTE — Progress Notes (Signed)
FMTS Attending Admission Note: Sierra Puccio,MD I  have seen and examined this patient, reviewed their chart. I have discussed this patient with the resident. I agree with the resident's findings, assessment and care plan.  Patient stated her pain has improved. Physical exam benign,imaging has been negative. I agree with GI assessment for possible endoscopy,to d/c home today if negative.

## 2012-10-14 NOTE — Consult Note (Signed)
[facility] Consultation Note  Referring Provider: Dr. Tawanna Cooler McDiarmid (Family Medicine) Primary Care Physician:  Mat Carne, MD  Reason for Consultation:  Abdominal pain  HPI: EURA MCCAUSLIN is a 27 y.o. female admitted for, and whom we've been asked to see because of, abdominal pain.  Started one week ago, is constant and dull.  Seems worse after moving around.  No clear exacerbation after eating.  No nausea or vomiting.  No NSAIDs.  Pain is gradually improving.  No radiation of pain.  No early satiety.  No GERD, dysphagia, blood in stool.  CT abdomen/pelvis, U/S, HIDA, labs all unrevealing except for mild leukocytosis.   Past Medical History  Diagnosis Date  . Diabetes mellitus 08/2009    late onset type 1    Past Surgical History  Procedure Laterality Date  . Cesarean section    . No past surgeries      Prior to Admission medications   Medication Sig Start Date End Date Taking? Authorizing Provider  insulin aspart (NOVOLOG) 100 UNIT/ML injection Inject 40-50 Units into the skin continuous. Per insulin pump 10/06/12  Yes Reather Littler, MD  sertraline (ZOLOFT) 50 MG tablet Take 4 tablets (200 mg total) by mouth daily. 07/15/12  Yes Garnetta Buddy, MD    Current Facility-Administered Medications  Medication Dose Route Frequency Provider Last Rate Last Dose  . 0.45 % sodium chloride infusion   Intravenous Continuous Charm Rings, MD 75 mL/hr at 10/13/12 0250    . acetaminophen (TYLENOL) tablet 650 mg  650 mg Oral Q6H PRN Charm Rings, MD   650 mg at 10/13/12 1613   Or  . acetaminophen (TYLENOL) suppository 650 mg  650 mg Rectal Q6H PRN Charm Rings, MD      . docusate sodium (COLACE) capsule 100 mg  100 mg Oral BID PRN Saralyn Pilar, DO      . gi cocktail (Maalox,Lidocaine,Donnatal)  30 mL Oral TID PRN Charm Rings, MD   30 mL at 10/13/12 0842  . heparin injection 5,000 Units  5,000 Units Subcutaneous Q8H Charm Rings, MD   5,000 Units at 10/13/12 [phone]   . HYDROcodone-acetaminophen (NORCO/VICODIN) 5-325 MG per tablet 1 tablet  1 tablet Oral Q4H PRN Saralyn Pilar, DO   1 tablet at 10/13/12 1519  . insulin pump   Subcutaneous TID AC, HS, 0200 Charm Rings, MD      . morphine 2 MG/ML injection 2 mg  2 mg Intravenous Q4H PRN Saralyn Pilar, DO   2 mg at 10/14/12 0914  . ondansetron (ZOFRAN) tablet 4 mg  4 mg Oral Q6H PRN Charm Rings, MD       Or  . ondansetron [facility]) injection 4 mg  4 mg Intravenous Q6H PRN Charm Rings, MD      . pantoprazole (PROTONIX) EC tablet 40 mg  40 mg Oral Daily Charm Rings, MD   40 mg at 10/13/12 1617  . senna (SENOKOT) tablet 8.6 mg  1 tablet Oral Daily PRN Saralyn Pilar, DO      . sertraline (ZOLOFT) tablet 200 mg  200 mg Oral Daily Charm Rings, MD   200 mg at 10/13/12 1617    Allergies as of 10/12/2012  . (No Known Allergies)    Family History  Problem Relation Age of Onset  . Depression Mother   . Depression Father   . Kidney disease Paternal Grandmother     History   Social History  .  Marital Status: Single    Spouse Name: N/A    Number of Children: N/A  . Years of Education: N/A   Occupational History  . Not on file.   Social History Main Topics  . Smoking status: Current Every Day Smoker -- 1.00 packs/day    Types: Cigarettes  . Smokeless tobacco: Not on file  . Alcohol Use: Yes     Comment: 1 drink every 3 months  . Drug Use: No  . Sexual Activity: Not on file     Comment: mirena   Other Topics Concern  . Not on file   Social History Narrative   Patient goes to school full-time at [employer]. Patient works part-time at [employer]. Is engaged. Having relationship difficulties. Has 2 children-ages 2 and 8.    Review of Systems: ROS Dr. Piedad Climes 10/13/12 reviewed and I agree  Physical Exam: Vital signs in last 24 hours: Temp:  [98.7 F (37.1 C)-99 F (37.2 C)] 98.7 F (37.1 C) (08/19 0500) Pulse Rate:  [71-83] 83 (08/19 0500) Resp:   [16-18] 16 (08/19 0500) BP: (97-110)/(53-70) 97/53 mmHg (08/19 0500) SpO2:  [100 %] 100 % (08/19 0500) Last BM Date: 10/12/12 General:   Alert,  Well-developed, well-nourished, pleasant and cooperative in NAD Head:  Normocephalic and atraumatic. Eyes:  Sclera clear, no icterus.   Conjunctiva pink. Ears:  Normal auditory acuity. Nose:  No deformity, discharge,  or lesions. Mouth:  No deformity or lesions.  Oropharynx pink & moist. Neck:  Supple; no masses or thyromegaly. Lungs:  Clear throughout to auscultation.   No wheezes, crackles, or rhonchi. No acute distress. Heart:  Regular rate and rhythm; no murmurs, clicks, rubs,  or gallops. Abdomen:  Soft, non distended, mild epigastric and right upper quadrant tenderness. No masses, hepatosplenomegaly or hernias noted. Normal bowel sounds, without guarding, and without rebound.     Msk:  Symmetrical without gross deformities. Normal posture. Pulses:  Normal pulses noted. Extremities:  Without clubbing or edema. Neurologic:  Alert and  oriented x4;  grossly normal neurologically. Skin:  Intact without significant lesions or rashes. Psych:  Alert and cooperative. Normal mood and affect.   Lab Results:  Recent Labs  10/12/12 1610 10/13/12 0700  WBC 13.1* 11.4*  HGB 13.6 12.2  HCT 39.8 35.4*  PLT 267 245   BMET  Recent Labs  10/12/12 1610 10/13/12 0700  NA 137 140  K 3.4* 4.0  CL 98 105  CO2 28 28  GLUCOSE 385* 88  BUN 7 6  CREATININE 0.50 0.59  CALCIUM 9.6 8.6   LFT  Recent Labs  10/13/12 0700  PROT 6.0  ALBUMIN 3.4*  AST 19  ALT 16  ALKPHOS 56  BILITOT 0.3   PT/INR No results found for this basename: LABPROT, INR,  in the last 72 hours  Studies/Results: Dg Chest 2 View  10/13/2012   *RADIOLOGY REPORT*  Clinical Data: The epigastric pain.  CHEST - 2 VIEW  Comparison: 07/11/2008  Findings: Normal heart size and pulmonary vascularity.  No focal airspace consolidation in the lungs.  No blunting of costophrenic  angles.  No pneumothorax.  Mediastinal contours appear intact.  No significant changes since the previous study.  IMPRESSION: No evidence of active pulmonary disease.   Original Report Authenticated By: Burman Nieves, M.D.   Ct Abdomen Pelvis W Contrast  10/12/2012   *RADIOLOGY REPORT*  Clinical Data: Abdominal pain.  CT ABDOMEN AND PELVIS WITH CONTRAST  Technique:  Multidetector CT imaging of the abdomen  and pelvis was performed following the standard protocol during bolus administration of intravenous contrast.  Contrast: OMNIPAQUE IOHEXOL 300 MG/ML  SOLN, 50mL OMNIPAQUE IOHEXOL 300 MG/ML  SOLN  Comparison: Ultrasound 10/09/2012.  CT 07/06/2010.  Findings: Lung bases are clear.  No effusions.  Heart is normal size.  Liver, gallbladder, spleen, pancreas, adrenals and kidneys and are normal.  IUD is in place within the uterus.  Small cyst or follicle in the right ovary.  Adnexa unremarkable.  Appendix is visualized and is normal. Bowel grossly unremarkable.  No free fluid, free air, or adenopathy.  No acute bony abnormality.  IMPRESSION: Unremarkable study.   Original Report Authenticated By: Charlett Nose, M.D.   Nm Hepato W/eject Fract  10/13/2012   *RADIOLOGY REPORT*  Clinical Data:  Right upper quadrant pain.  NUCLEAR MEDICINE HEPATOBILIARY IMAGING WITH GALLBLADDER EF  Technique:  Sequential images of the abdomen were obtained out to 60 minutes following intravenous administration of radiopharmaceutical. After oral ingestion of 8 ounces of ensure Plus, gallbladder ejection fraction was determined.  Radiopharmaceutical:  5.0 mCi Tc-82m Choletec  Comparison:   10/12/2012  Findings: Following the intravenous administration of the radiopharmaceutical there is uniform tracer uptake by the liver with clearance from blood pool.  Radiotracer accumulation within the gallbladder occurs within 10 minutes.  There is radiotracer activity within small bowel loops by 25 minutes.  The gallbladder ejection fraction  is calculated to be equal 45.2%.  IMPRESSION:  1.  Patent cystic duct without evidence for acute cholecystitis. 2.  Normal gallbladder ejection fraction.   Original Report Authenticated By: Signa Kell, M.D.   Impression:  1.  Abdominal pain.  Negative labs and imaging work-up.  Suspect musculoskeletal etiology.  Peptic ulcer can't be excluded, but doesn't seem likely.  Symptoms not consistent with gallbladder etiology or gastroparesis.  Plan:  1.  I have discussed with patient, and have offered her two options (1) watchful waiting (since symptoms are improving) versus (2) endoscopy to exclude ulcer disease.  Patient would like to go ahead with endoscopy. 2.  EGD scheduled for 12:45 this afternoon with Dr. Randa Evens.  If endoscopy is unrevealing, as I suspect, it would be OK from GI perspective to discharge home with outpatient primary care follow-up [facility] can also see if desired). 3.  I don't see need for gastric emptying study at this time; I have canceled this test. 4.  Thank you for the consult.   LOS: 2 days   Miarose Lippert M  10/14/2012, 10:39 AM

## 2012-10-14 NOTE — Progress Notes (Signed)
Family Medicine Teaching Service Daily Progress Note Intern Pager: [phone]  Patient name: Sierra Schneider Medical record number: [id number] Date of birth: 1985/09/20 Age: 27 y.o. Gender: female  Primary Care Provider: Mat Carne, MD Consultants: GI Code Status: Full  Pt Overview and Major Events to Date:  8/18 - Negative work-up (Abd [place], CT Abd/Pelvis, HIDA) / Labs unremarkable 8/19 - c/s GI re: possible EGD (concern of PUD) / persistent RUQ pain 8/20 - s/p EGD - duodenal ulcer / continue PPI therapy  Assessment and Plan: Sierra Schneider is a 27 y.o. year old female presenting with persistent abdominal pain (RUQ/epigastric) worsening over past 5 days, worse with pressure or laying flat/standing up straight. No associated fevers, nausea/vomiting, diarrhea/constipation. Has not tried any pain or antacid meds. Surg Hx +C-section, no abd surgeries. PMH is significant for Type 1 DM (IDDM) w/ insulin pump, Depression, Mirena IUD.  # Abdominal Pain, RUQ / epigastric, secondary to Duodenal Ulcer - Improved - persistent abd pain x6 days, no new assoc symptoms or concerning signs (afebrile, no n/v/d/c), decent appetite - previous work-up: negative imaging (Abd [place], CT abd/pelvis) / negative labs CMET - PUD (risk factors +smoker, +EtOH) w/o hematemesis / melena (no acute GI blood loss) - WBC trended down 11.4 (13.1) / H. Pylori IgG - negative (8/18) HIDA Scan - negative (patent cystic duct w/ normal gallbladder EF) - r/o gallbladder dysfxn, stones, infxn Pain Management - DC'd Morphine 2mg  IV (last dose 0800), pain improved today, plan to discharge with some Tramadol for pain, as reported itching with Norco. AVOID NSAIDs, can take tylenol. [ ]  f/u GI recs   - (8/19) EGD - +Duodenal Ulcer   - continue PPI therapy - Protonix 40mg  daily on discharge   - scheduled apt Dr. Randa Evens [facility]) 12/17/12 @ 1:30pm  # Type 1 DM (IDDM), s/p insulin pump  - Followed by Endocrine (Dr. Lucianne Muss), on  insulin pump (< 1 yr)  - no record of recent HgbA1c, per records well-controlled, compliant, frequent CBG checks  - HgbA1c 7.8 - DM Coordinator - management recs w/ Insulin Pump   - total basal insulin in 24 hrs = 15.3 - CBGs low 53 --> 86, continue pump on discharge to home with regular diet  # Hypokalemia - resolved - measured 3.4 on admission,given KCl tab x1 - Stable, normal range 3.8-4.0  # Depression  - stable, continued home Zoloft 200mg  daily  FEN/GI: 0.45 NS 75cc/hr / normal K+ / Carb modified diet / Protonix 40mg  daily  Prophylaxis: refused Heparin SubQ and SCDs - pt elects for frequent ambulation  Disposition: Likely discharge to home today  Subjective: Patient sitting up in bed. Says that she is ready to go home. Feels better, abd pain still present but not worse, aching 3/10 now, last morphine at 8:00am. She says Norco gave her itching, so would prefer not to get Rx for Norco at home. Will try Tramadol for pain. Discussed etiology of Ulcer and continue PPI, urged to decrease smoking and EtOH use, avoid NSAIDs. Agreeable to treatment plan, will follow-up with [facility] and GI as needed. Continued concern about leaving as soon as possible.  ROS - +abd pain (improved) - Negative nausea / vomiting / constipation / diarrhea.  Objective: Temp:  [97.5 F (36.4 C)-98.7 F (37.1 C)] 98.7 F (37.1 C) (08/20 0547) Pulse Rate:  [59-73] 65 (08/20 0547) Resp:  [8-28] 14 (08/20 0547) BP: (89-130)/(45-83) 89/45 mmHg (08/20 0547) SpO2:  [97 %-100 %] 97 % (08/20  1191) Weight:  [121 lb 9.6 oz (55.157 kg)-121 lb 11.1 oz (55.2 kg)] 121 lb 9.6 oz (55.157 kg) (08/20 0547) Physical Exam: General: awake, sitting up in bed w/ legs curled, NAD HEENT: PERRLA, EOMI, MMM Cardiovascular: RRR, no murmurs  Respiratory: CTAB w/o wheeze, crackles. Good resp effort Abdomen: soft, +mild tender RUQ/epigastric (improved), no guarding or rebound, no masses, able to ambulate with improved  pain Extremities: no edema, non-tender, moves all ext, +2 peripheral pulses  Skin: warm, dry  Neuro: grossly non-focal, muscle str normal, intact sensation to light touch, gait not tested  Laboratory:  Recent Labs Lab 10/12/12 1610 10/13/12 0700 10/15/12 0603  WBC 13.1* 11.4* 8.9  HGB 13.6 12.2 12.0  HCT 39.8 35.4* 35.9*  PLT 267 245 245    Recent Labs Lab 10/12/12 1610 10/13/12 0700 10/15/12 0603  NA 137 140 139  K 3.4* 4.0 3.8  CL 98 105 104  CO2 28 28 27   BUN 7 6 7   CREATININE 0.50 0.59 0.55  CALCIUM 9.6 8.6 8.5  PROT 6.9 6.0 5.3*  BILITOT 0.3 0.3 0.1*  ALKPHOS 78 56 56  ALT 14 16 14   AST 13 19 14   GLUCOSE 385* 88 127*   HgbA1c - 7.8 FOBT - pending  8/17 Upreg - negative  8/17 UA - negative  Imaging/Diagnostic Tests:  8/14 Abd [place] - negative  8/17 CT Abd-Pelvis - Unremarkable  8/18 2v CXR - negative   8/18 HIDA Scan Findings: Following the intravenous administration of the  radiopharmaceutical there is uniform tracer uptake by the liver  with clearance from blood pool. Radiotracer accumulation within  the gallbladder occurs within 10 minutes. There is radiotracer  activity within small bowel loops by 25 minutes. The gallbladder  ejection fraction is calculated to be equal 45.2%.  IMPRESSION:  1. Patent cystic duct without evidence for acute cholecystitis.  2. Normal gallbladder ejection fraction.  8/19 EGD Post-op Dx: Duodenal Ulcer w/o signs of active bleeding. Biopsy for H. Pylori - pending  Saralyn Pilar, DO 10/15/2012, 10:48 AM PGY-1, [facility] Family Medicine FPTS Intern pager: [phone], text pages welcome

## 2012-10-15 ENCOUNTER — Telehealth: Payer: Self-pay | Admitting: *Deleted

## 2012-10-15 ENCOUNTER — Encounter (HOSPITAL_COMMUNITY): Payer: Self-pay | Admitting: Gastroenterology

## 2012-10-15 LAB — COMPREHENSIVE METABOLIC PANEL
AST: 14 U/L (ref 0–37)
Alkaline Phosphatase: 56 U/L (ref 39–117)
CO2: 27 mEq/L (ref 19–32)
Chloride: 104 mEq/L (ref 96–112)
Creatinine, Ser: 0.55 mg/dL (ref 0.50–1.10)
GFR calc non Af Amer: >90 mL/min (ref >90)
Potassium: 3.8 mEq/L (ref 3.5–5.1)
Total Bilirubin: 0.1 mg/dL — ABNORMAL LOW (ref 0.3–1.2)

## 2012-10-15 LAB — CBC WITH DIFFERENTIAL/PLATELET
Basophils Absolute: 0 10*3/uL (ref 0.0–0.1)
Basophils Relative: 0 % (ref 0–1)
Eosinophils Absolute: 0.3 10*3/uL (ref 0.0–0.7)
Eosinophils Relative: 4 % (ref 0–5)
Lymphs Abs: 3 10*3/uL (ref 0.7–4.0)
MCH: 30.5 pg (ref 26.0–34.0)
MCHC: 33.4 g/dL (ref 30.0–36.0)
MCV: 91.1 fL (ref 78.0–100.0)
Neutrophils Relative %: 56 % (ref 43–77)
Platelets: 245 10*3/uL (ref 150–400)
RBC: 3.94 MIL/uL (ref 3.87–5.11)
RDW: 13.2 % (ref 11.5–15.5)

## 2012-10-15 LAB — GLUCOSE, CAPILLARY
Glucose-Capillary: 125 mg/dL — ABNORMAL HIGH (ref 70–99)
Glucose-Capillary: 244 mg/dL — ABNORMAL HIGH (ref 70–99)

## 2012-10-15 MED ORDER — PANTOPRAZOLE SODIUM 40 MG PO TBEC: 40.0000 mg | DELAYED_RELEASE_TABLET | Freq: Every day | ORAL | Status: DC

## 2012-10-15 MED ORDER — TRAMADOL HCL 50 MG PO TABS: 50.0000 mg | ORAL_TABLET | Freq: Four times a day (QID) | ORAL | Status: DC | PRN

## 2012-10-15 NOTE — Discharge Summary (Signed)
FMTS Attending Admission Note: Jyair Kiraly,MD I  have seen and examined this patient, reviewed their chart. I have discussed this patient with the resident. I agree with the resident's findings, assessment and care plan.  

## 2012-10-15 NOTE — Progress Notes (Signed)
FMTS Attending Admission Note: Abel Ra,MD I  have seen and examined this patient, reviewed their chart. I have discussed this patient with the resident. I agree with the resident's findings, assessment and care plan.  

## 2012-10-15 NOTE — Progress Notes (Signed)
Patient discharged home with prescription.  Patient requested to return to work tomorrow and date was changed on work note by Deneise Lever, RN.  She verbalized understanding about new medications and dietary changes. Patient walked to friend's car.

## 2012-10-15 NOTE — Op Note (Signed)
Sierra Schneider [facility] [address] [place], [address]   ENDOSCOPY PROCEDURE REPORT  PATIENT: Sierra, Schneider  MR#: [id number] BIRTHDATE: [birth year] , 27  yrs. old GENDER: Female ENDOSCOPIST:Meiko Stranahan Randa Evens, MD REFERRED BY:  Family Practice Teaching Service PROCEDURE DATE:  10/14/2012 PROCEDURE:   EGD and biopsy ASA CLASS:     class 2 INDICATIONS:   right upper quadrant pain MEDICATION:   fentanyl 75 mcg, versed 8 mg, Benadryl 25 mg IV TOPICAL ANESTHETIC:    cetacaine spray  DESCRIPTION OF PROCEDURE:   The Pentax upper scope was inserted and advanced. The GE junction was normal. There was no esophagitis. The stomach was entered grossly normal in the foward and retroflex view. The duodenum was entered.. The 2nd duodenum appeared normal and was biopsied for celiac disease. Duodenal bulb reveals a 1 cm superficial duodenal ulcer at the junction of the duodenal bulb and 2nd duodenum. It was marked duodenitis. The scope was withdrawn back in the stomach and there was minimum gross gastritis. Biopsies of the antrum were obtained to test for H. pylori. The scope was withdrawn and the initial findings were confirmed. The patient tolerated procedure well.     COMPLICATIONS: None  ENDOSCOPIC IMPRESSION: 1. Duodenal Ulcer. No signs of active bleeding  RECOMMENDATIONS: 1. we'll check results of intro biopsies for H pylori. Would send home on protonix will follow-up in 2 months in the office    _______________________________ eSigned:  Carman Ching, MD 10/14/2012 1:47 PM

## 2012-10-15 NOTE — Telephone Encounter (Signed)
Patient has hospital f/u appt with Dr. Randa Evens at [facility] on 12/17/12.  Due to patient having Medicaid, office calling to request NPI #  to authorize appt.  NPI # given.  Gaylene Brooks, RN

## 2012-10-24 ENCOUNTER — Inpatient Hospital Stay: Payer: Medicaid Other | Admitting: Family Medicine

## 2012-11-05 ENCOUNTER — Emergency Department (HOSPITAL_COMMUNITY)
Admission: EM | Admit: 2012-11-05 | Discharge: 2012-11-05 | Disposition: A | Discharge status: Home / Self Care | Payer: Medicaid Other | Source: Home / Self Care | Attending: Family Medicine | Admitting: Family Medicine

## 2012-11-05 ENCOUNTER — Encounter (HOSPITAL_COMMUNITY): Payer: Self-pay | Admitting: *Deleted

## 2012-11-05 DIAGNOSIS — J302 Other seasonal allergic rhinitis: Secondary | ICD-10-CM

## 2012-11-05 DIAGNOSIS — J309 Allergic rhinitis, unspecified: Secondary | ICD-10-CM

## 2012-11-05 MED ORDER — HYDROCOD POLST-CHLORPHEN POLST 10-8 MG/5ML PO LQCR: 5.0000 mL | Freq: Two times a day (BID) | ORAL | Status: DC | PRN

## 2012-11-05 MED ORDER — TRIAMCINOLONE ACETONIDE 40 MG/ML IJ SUSP
INTRAMUSCULAR | Status: AC
  Filled 2012-11-05: qty 1

## 2012-11-05 MED ORDER — TRIAMCINOLONE ACETONIDE 40 MG/ML IJ SUSP
40.0000 mg | Freq: Once | INTRAMUSCULAR | Status: AC
  Administered 2012-11-05: 40 mg via INTRAMUSCULAR

## 2012-11-05 MED ORDER — FEXOFENADINE HCL 180 MG PO TABS: 180.0000 mg | ORAL_TABLET | Freq: Every day | ORAL | Status: DC

## 2012-11-05 MED ORDER — FLUTICASONE PROPIONATE 50 MCG/ACT NA SUSP: 1.0000 | Freq: Two times a day (BID) | NASAL | Status: DC

## 2012-11-05 NOTE — ED Notes (Signed)
Pt  Reports  Symptoms    Of  [place]    Cough       Nasal  stuffyness         X  sev  Days       Pt      Sitting  Upright on  Exam table  Speaking     In  Complete  sentances         No  Severe  Distress         Skin is  Warm  /  Dry

## 2012-11-05 NOTE — ED Provider Notes (Signed)
CSN: [id number]     Arrival date & time 11/05/12  1301 History   First MD Initiated Contact with Patient 11/05/12 1416     Chief Complaint  Patient presents with  . Cough   (Consider location/radiation/quality/duration/timing/severity/associated sxs/prior Treatment) Patient is a 27 y.o. female presenting with cough. The history is provided by the patient.  Cough Cough characteristics:  Non-productive and dry Severity:  Mild Duration:  3 days Chronicity:  New Smoker: no   Associated symptoms: rhinorrhea and sinus congestion   Associated symptoms: no fever and no sore throat     Past Medical History  Diagnosis Date  . Diabetes mellitus 08/2009    late onset type 1   Past Surgical History  Procedure Laterality Date  . Cesarean section    . No past surgeries    . Esophagogastroduodenoscopy Left 10/14/2012    Procedure: ESOPHAGOGASTRODUODENOSCOPY (EGD);  Surgeon: Vertell Novak., MD;  Location: [name] ENDOSCOPY;  Service: Endoscopy;  Laterality: Left;   Family History  Problem Relation Age of Onset  . Depression Mother   . Depression Father   . Kidney disease Paternal Grandmother    History  Substance Use Topics  . Smoking status: Current Every Day Smoker -- 1.00 packs/day    Types: Cigarettes  . Smokeless tobacco: Not on file  . Alcohol Use: Yes     Comment: 1 drink every 3 months   OB History   Grav Para Term Preterm Abortions TAB SAB Ect Mult Living                 Review of Systems  Constitutional: Negative for fever.  HENT: Positive for congestion, rhinorrhea, sneezing and postnasal drip. Negative for sore throat.   Respiratory: Positive for cough.     Allergies  Review of patient's allergies indicates no known allergies.  Home Medications   Current Outpatient Rx  Name  Route  Sig  Dispense  Refill  . chlorpheniramine-HYDROcodone (TUSSIONEX PENNKINETIC ER) 10-8 MG/5ML LQCR   Oral   Take 5 mLs by mouth every 12 (twelve) hours as needed.   115 mL   0    . fexofenadine (ALLEGRA) 180 MG tablet   Oral   Take 1 tablet (180 mg total) by mouth daily.   30 tablet   1   . fluticasone (FLONASE) 50 MCG/ACT nasal spray   Nasal   Place 1 spray into the nose 2 (two) times daily.   1 g   2   . insulin aspart (NOVOLOG) 100 UNIT/ML injection   Subcutaneous   Inject 40-50 Units into the skin continuous. Per insulin pump   3 vial   3   . pantoprazole (PROTONIX) 40 MG tablet   Oral   Take 1 tablet (40 mg total) by mouth daily.   30 tablet   3   . sertraline (ZOLOFT) 50 MG tablet   Oral   Take 4 tablets (200 mg total) by mouth daily.   120 tablet   5     Patient needs to schedule appointment with her doc ...   . traMADol (ULTRAM) 50 MG tablet   Oral   Take 1 tablet (50 mg total) by mouth every 6 (six) hours as needed for pain.   30 tablet   0    BP 122/70  Pulse 72  Temp(Src) 98.6 F (37 C) (Oral)  Resp 16  SpO2 99% Physical Exam  Nursing note and vitals reviewed. Constitutional: She is oriented to person, place,  and time. She appears well-developed and well-nourished.  HENT:  Head: Normocephalic.  Right Ear: External ear normal.  Left Ear: External ear normal.  Mouth/Throat: Oropharynx is clear and moist.  Eyes: Conjunctivae are normal. Pupils are equal, round, and reactive to light.  Neck: Normal range of motion. Neck supple.  Cardiovascular: Regular rhythm.   Pulmonary/Chest: Breath sounds normal.  Lymphadenopathy:    She has no cervical adenopathy.  Neurological: She is alert and oriented to person, place, and time.  Skin: Skin is warm and dry.    ED Course  Procedures (including critical care time) Labs Review Labs Reviewed - No data to display Imaging Review No results found.  MDM      Linna Hoff, MD 11/05/12 [phone]

## 2012-12-17 ENCOUNTER — Telehealth: Payer: Self-pay | Admitting: Family Medicine

## 2012-12-17 NOTE — Telephone Encounter (Signed)
[facility] called to let the team know that Sierra Schneider did a no show and no call to her scheduled appointment today. 10/22. JW

## 2012-12-29 ENCOUNTER — Other Ambulatory Visit (INDEPENDENT_AMBULATORY_CARE_PROVIDER_SITE_OTHER): Payer: Medicaid Other

## 2012-12-29 ENCOUNTER — Other Ambulatory Visit: Payer: Self-pay | Admitting: *Deleted

## 2012-12-29 DIAGNOSIS — E109 Type 1 diabetes mellitus without complications: Secondary | ICD-10-CM

## 2012-12-29 LAB — MICROALBUMIN / CREATININE URINE RATIO
Microalb Creat Ratio: 0.8 mg/g (ref 0.0–30.0)
Microalb, Ur: 2.1 mg/dL — ABNORMAL HIGH (ref 0.0–1.9)

## 2012-12-29 LAB — COMPREHENSIVE METABOLIC PANEL
ALT: 16 U/L (ref 0–35)
AST: 16 U/L (ref 0–37)
Albumin: 4.5 g/dL (ref 3.5–5.2)
Alkaline Phosphatase: 60 U/L (ref 39–117)
Glucose, Bld: 107 mg/dL — ABNORMAL HIGH (ref 70–99)
Potassium: 3.9 mEq/L (ref 3.5–5.1)
Sodium: 139 mEq/L (ref 135–145)
Total Bilirubin: 0.6 mg/dL (ref 0.3–1.2)
Total Protein: 7.5 g/dL (ref 6.0–8.3)

## 2012-12-29 LAB — URINALYSIS
Nitrite: NEGATIVE
Total Protein, Urine: NEGATIVE
Urine Glucose: NEGATIVE
pH: 6 (ref 5.0–8.0)

## 2012-12-29 LAB — LIPID PANEL
HDL: 60.5 mg/dL (ref >39.00)
Triglycerides: 65 mg/dL (ref 0.0–149.0)

## 2012-12-29 LAB — HEMOGLOBIN A1C: Hgb A1c MFr Bld: 9.4 % — ABNORMAL HIGH (ref 4.6–6.5)

## 2013-01-02 ENCOUNTER — Encounter: Payer: Self-pay | Admitting: Endocrinology

## 2013-01-02 ENCOUNTER — Ambulatory Visit (INDEPENDENT_AMBULATORY_CARE_PROVIDER_SITE_OTHER): Payer: Medicaid Other | Admitting: Endocrinology

## 2013-01-02 VITALS — BP 108/68 | HR 76 | Temp 98.2°F | Resp 12 | Ht 63.0 in | Wt 116.9 lb

## 2013-01-02 DIAGNOSIS — IMO0002 Reserved for concepts with insufficient information to code with codable children: Secondary | ICD-10-CM

## 2013-01-02 DIAGNOSIS — Z23 Encounter for immunization: Secondary | ICD-10-CM

## 2013-01-02 DIAGNOSIS — E1065 Type 1 diabetes mellitus with hyperglycemia: Secondary | ICD-10-CM

## 2013-01-02 DIAGNOSIS — E7800 Pure hypercholesterolemia, unspecified: Secondary | ICD-10-CM

## 2013-01-02 MED ORDER — VARENICLINE TARTRATE 0.5 MG PO TABS: 0.5000 mg | ORAL_TABLET | Freq: Two times a day (BID) | ORAL | Status: DC

## 2013-01-02 NOTE — Patient Instructions (Addendum)
Bolus right after dinner for better adjustment of bolus Bolus for all meals and snacks  Lower fat choices at lunch; add 2-3 units for hi fat meals  Please check blood sugars at least half the time about 2 hours after any meal and as directed on waking up. Please bring blood sugar monitor to each visit More sugars at lunch time  Calorie Brooke Dare for [employer]

## 2013-01-02 NOTE — Progress Notes (Signed)
Patient ID: Sierra Schneider, female   DOB: November 23, 1985, 27 y.o.   MRN: [id number]  Sierra Schneider is an 27 y.o. female.   Reason for Appointment: Insulin Pump followup:   History of Present Illness   Diagnosis: Type 1 DIABETES MELITUS     DIABETES history: She has had diabetes since about 2011 and has been on insulin pump for at least 2 years She had fairly good control in the first 2 years of her disease but blood sugar has been more difficult in the last year at least She has been somewhat irregular in followup Compared to the last visit the diabetes appears worse Problems identified are as follows: 1. Eating relatively high fat meals especially at lunchtime when she is eating out fast food. She is not covering the fat content with extra insulin 2. Often not entering carbohydrates for her meals 3. Periodically not bolusing at mealtimes especially lunchtime 4. May not bolus accurately at suppertime and she eat more or less than her estimated carbohydrate intake at the time of bolus 5. No exercise Not checking blood sugars enough, taking 0-4 times a day  CURRENT insulin pump:  One Touch Ping  The pump SETTINGS are: Basal rate: Midnight = 0.8, 6 AM = 0.85 , 9 AM = 0.6, 6 PM = 0.55 and 7 PM = 0.4 Carb Ratio: 1: 10 Correction factor 1: 30  GLUCOSE readings are as follows: FASTING 73-336 with average about 164 and mid day average 110, early evening average about 174 and after 8 PM average about 160 Blood sugars are quite variable around 6 PM, before supper and will continue blood sugars after supper appear fairly good  HYPERGLYCEMIA Occuring occasionally on waking up but fairly frequently before supper  HYPOGLYCEMIC episodes are rare rare but has had readings sporadically low at 10 AM, 1 PM, 6 PM and 8 PM, lowest glucose 40  EXERCISE: None   LABS: Lab Results  Component Value Date   HGBA1C 9.4* 12/29/2012   HGBA1C 7.8* 10/14/2012   HGBA1C 7.8* 10/13/2012   Lab Results  Component  Value Date   MICROALBUR 2.1* 12/29/2012   CREATININE 0.5 12/29/2012     Appointment on 12/29/2012  Component Date Value Range Status  . Cholesterol 12/29/2012 208* 0 - 200 mg/dL Final   ATP III Classification       Desirable:  < 200 mg/dL               Borderline High:  200 - 239 mg/dL          High:  > = 782 mg/dL  . Triglycerides 12/29/2012 65.0  0.0 - 149.0 mg/dL Final   Normal:  <956 mg/dLBorderline High:  150 - 199 mg/dL  . HDL 12/29/2012 60.50  >39.00 mg/dL Final  . VLDL 21/30/8657 13.0  0.0 - 40.0 mg/dL Final  . Total CHOL/HDL Ratio 12/29/2012 3   Final                  Men          Women1/2 Average Risk     3.4          3.3Average Risk          5.0          4.42X Average Risk          9.6          7.13X Average Risk          15.0  11.0                      . Color, Urine 12/29/2012 YELLOW  Yellow;Lt. Yellow Final  . APPearance 12/29/2012 CLEAR  Clear Final  . Specific Gravity, Urine 12/29/2012 >=1.030  1.000 - 1.030 Final  . pH 12/29/2012 6.0  5.0 - 8.0 Final  . Total Protein, Urine 12/29/2012 NEGATIVE  Negative Final  . Urine Glucose 12/29/2012 NEGATIVE  Negative Final  . Ketones, ur 12/29/2012 15  Negative Final  . Bilirubin Urine 12/29/2012 NEGATIVE  Negative Final  . Hgb urine dipstick 12/29/2012 TRACE-LYSED  Negative Final  . Urobilinogen, UA 12/29/2012 0.2  0.0 - 1.0 Final  . Leukocytes, UA 12/29/2012 NEGATIVE  Negative Final  . Nitrite 12/29/2012 NEGATIVE  Negative Final  . Microalb, Ur 12/29/2012 2.1* 0.0 - 1.9 mg/dL Final  . Creatinine,U 65/78/4696 269.4   Final  . Microalb Creat Ratio 12/29/2012 0.8  0.0 - 30.0 mg/g Final  . Sodium 12/29/2012 139  135 - 145 mEq/L Final  . Potassium 12/29/2012 3.9  3.5 - 5.1 mEq/L Final  . Chloride 12/29/2012 105  96 - 112 mEq/L Final  . CO2 12/29/2012 28  19 - 32 mEq/L Final  . Glucose, Bld 12/29/2012 107* 70 - 99 mg/dL Final  . BUN 29/52/8413 10  6 - 23 mg/dL Final  . Creatinine, Ser 12/29/2012 0.5  0.4 - 1.2 mg/dL Final   . Total Bilirubin 12/29/2012 0.6  0.3 - 1.2 mg/dL Final  . Alkaline Phosphatase 12/29/2012 60  39 - 117 U/L Final  . AST 12/29/2012 16  0 - 37 U/L Final  . ALT 12/29/2012 16  0 - 35 U/L Final  . Total Protein 12/29/2012 7.5  6.0 - 8.3 g/dL Final  . Albumin 24/40/1027 4.5  3.5 - 5.2 g/dL Final  . Calcium 25/36/6440 9.2  8.4 - 10.5 mg/dL Final  . GFR 34/74/2595 142.08  >60.00 mL/min Final  . Hemoglobin A1C 12/29/2012 9.4* 4.6 - 6.5 % Final   Glycemic Control Guidelines for People with Diabetes:Non Diabetic:  <6%Goal of Therapy: <7%Additional Action Suggested:  >8%   . Direct LDL 12/29/2012 129.4   Final   Optimal:  <100 mg/dLNear or Above Optimal:  100-129 mg/dLBorderline High:  130-159 mg/dLHigh:  160-189 mg/dLVery High:  >190 mg/dL      Medication List       This list is accurate as of: 01/02/13  8:20 AM.  Always use your most recent med list.               chlorpheniramine-HYDROcodone 10-8 MG/5ML Lqcr  Commonly known as:  TUSSIONEX PENNKINETIC ER  Take 5 mLs by mouth every 12 (twelve) hours as needed.     fexofenadine 180 MG tablet  Commonly known as:  ALLEGRA  Take 1 tablet (180 mg total) by mouth daily.     fluticasone 50 MCG/ACT nasal spray  Commonly known as:  FLONASE  Place 1 spray into the nose 2 (two) times daily.     insulin aspart 100 UNIT/ML injection  Commonly known as:  NOVOLOG  Inject 40-50 Units into the skin continuous. Per insulin pump     pantoprazole 40 MG tablet  Commonly known as:  PROTONIX  Take 1 tablet (40 mg total) by mouth daily.     sertraline 50 MG tablet  Commonly known as:  ZOLOFT  Take 4 tablets (200 mg total) by mouth daily.     traMADol 50 MG tablet  Commonly known as:  ULTRAM  Take 1 tablet (50 mg total) by mouth every 6 (six) hours as needed for pain.        Allergies: No Known Allergies  Past Medical History  Diagnosis Date  . Diabetes mellitus 08/2009    late onset type 1    Past Surgical History  Procedure  Laterality Date  . Cesarean section    . No past surgeries    . Esophagogastroduodenoscopy Left 10/14/2012    Procedure: ESOPHAGOGASTRODUODENOSCOPY (EGD);  Surgeon: Vertell Novak., MD;  Location: [facility] ENDOSCOPY;  Service: Endoscopy;  Laterality: Left;    Family History  Problem Relation Age of Onset  . Depression Mother   . Depression Father   . Kidney disease Paternal Grandmother     Social History:  reports that she has been smoking Cigarettes.  She has been smoking about 1.00 pack per day. She does not have any smokeless tobacco history on file. She reports that she drinks alcohol. She reports that she does not use illicit drugs.  REVIEW of systems:  No history of hypertension  She has had mild hypercholesterolemia which appears stable. She has a relatively high fat diet   EXAM:  BP 108/68  Pulse 76  Temp(Src) 98.2 F (36.8 C)  Resp 12  Ht 5\' 3"  (1.6 m)  Wt 116 lb 14.4 oz (53.025 kg)  BMI 20.71 kg/m2  SpO2 98%   ASSESSMENT:  Problems identified: See history of present illness for details of her current management, blood sugar patterns and problems identified Her A1c is significantly higher in she has frequent high readings in the evenings probably related to poor diet as well as inadequate bolusing especially for high fat meals May be missing some boluses also occasionally Also has inadequate glucose monitoring especially at lunch  Plan: Discussed above problems with the patient and wears to improve her control Since her blood sugars did not show any consistent trend will not change her basal rate as yet Also postprandial readings especially at suppertime are fairly good She does need to check more readings before and after lunch Encouraged her to start regular walking program also She will program calorie Brooke Dare  application on her Smart phone to help with accurate bolusing at mealtimes especially eating out She will additionally bolus 2-3 units at least for all  high fat meals May try to bolus right after eating supper if not clear how much she will eat  Will try to identify her blood sugar patterns better with using the iPro recording, discussed how this is done including needing to keep a record of her diet and boluses She will review the results with nurse educator and myself Also not clear if her insurance will pay for continuous glucose sensor  She will cut back on saturated fat to help with hypercholesterolemia  Counseling time over 50% of today's 25 minute visit  Kelby Lotspeich 01/02/2013, 8:20 AM

## 2013-01-03 DIAGNOSIS — E7800 Pure hypercholesterolemia, unspecified: Secondary | ICD-10-CM | POA: Insufficient documentation

## 2013-01-06 ENCOUNTER — Telehealth: Payer: Self-pay | Admitting: Endocrinology

## 2013-01-07 NOTE — Telephone Encounter (Signed)
I gave her the number to the [facility] to see about her appt.

## 2013-01-30 ENCOUNTER — Ambulatory Visit: Payer: Medicaid Other | Admitting: Endocrinology

## 2013-03-03 ENCOUNTER — Other Ambulatory Visit: Payer: Self-pay | Admitting: *Deleted

## 2013-03-03 MED ORDER — VARENICLINE TARTRATE 0.5 MG PO TABS: 0.5000 mg | ORAL_TABLET | Freq: Two times a day (BID) | ORAL | Status: DC

## 2013-03-16 ENCOUNTER — Encounter: Payer: Medicaid Other | Attending: Endocrinology | Admitting: *Deleted

## 2013-03-16 ENCOUNTER — Encounter: Payer: Self-pay | Admitting: *Deleted

## 2013-03-16 VITALS — Ht 63.0 in | Wt 115.3 lb

## 2013-03-16 DIAGNOSIS — E1065 Type 1 diabetes mellitus with hyperglycemia: Secondary | ICD-10-CM

## 2013-03-16 DIAGNOSIS — IMO0002 Reserved for concepts with insufficient information to code with codable children: Secondary | ICD-10-CM

## 2013-03-16 DIAGNOSIS — Z713 Dietary counseling and surveillance: Secondary | ICD-10-CM | POA: Insufficient documentation

## 2013-03-16 NOTE — Patient Instructions (Signed)
Plan:  Consider keeping a syringe in your meter case for easy access to insulin when you have a pump problem Consider obtaining foil wrapped Ketostix ([employer]) and keep one in your meter case also Start using combo bolus for higher fat meal, use 50% now and 50% extended for 2 hour duration Consider checking BG at alternate times per day as directed by MD  I will include you on the e-mail notification list for the Type 1 Support Group as you requested

## 2013-03-16 NOTE — Progress Notes (Signed)
Appt start time: 1030 end time:  1130.  Assessment:  Patient was seen on  03/16/13 for individual diabetes appointment. She has been referred to wear diagnostic sensor (iPRo) however this was not identified by our staff when her appointment was made, so she has agreed to come back on 03/25/13 to put the sensor on and be able to wear it for the 4-5 days requested by MD.  She states she was diagnosed with DM1 after her 2nd pregnancy about 4 years ago. 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 She states she enjoys going to movies, bowling, out to dinner or shopping. She is SMBG about twice a day, and acknowledges that she should test more. She is worried about her weight, would like to gain a few pounds but has great difficulty doing that. She states her eating habits are poor due to work, and being a single Mom with active kids. Due to uncertainty of how much food she might be eating, she often waits until after her meal to give insulin so she doesn't over dose. She has not been using the Extended Bolus feature for high fat meals as she isn't sure how to on her pump.  Current HbA1c: 9.4%  Preferred Learning Style: Auditory  Learning Readiness:   Contemplating  Ready  MEDICATIONS: see list, diabetes medication is Novolog in [place] One Touch Ping insulin pump  DIETARY INTAKE:  24-hr recall:  B ( AM): fast food on way to work- 1-2 chicken biscuits and coffee with Splenda and cream  Snk ( AM): no  L ( PM): buys fast food- sandwich with fries OR 6 " sub with fries OR Japanese shrimp steak & grilled vegetables on rice with white sauce, diet soda or water Snk ( PM): no D ( PM): quick to prepare: hamburger helper or other casserole type meal, vegetables, occasionally a roll, diet soda or water Snk ( PM): occasionally if can't sleep will get sweet cereal or flavored oatmeal with 2% milk Beverages: coffee, diet soda  or water   Usual physical activity: not at this time, due to time constraints  Estimated energy needs: for weight gain 2000 calories 225 g carbohydrates 150 g protein 56 g fat  Intervention:  Nutrition counseling provided. We used this appointment to review her current diabetes management skills. I discusses the following:  Cause, treatment and prevention of ketones while on insulin pump and how to obtain foil wrapped ketone strips.   Rationale of giving insulin via syringe if BG is high and does not respond to bolus after 2 hours  How high fat meals cause delayed and prolonged BG excursions and how to use Extended Bolus to better match BG curve and provide better BG management for high fat meals.  Benefit of checking BG after meals to determine if pump settings are accurate for her and to treat hyperglycemia sooner.  Benefit of participating in our Type 1 Support Group both emotionally and educationally  Plan:  Consider keeping a syringe in your meter case for easy access to insulin when you have a pump problem Consider obtaining foil wrapped Ketostix (Bayer) and keep one in your meter case also Start using combo bolus for higher fat meal, use 50% now and 50% extended for 2 hour duration Consider checking BG at alternate times per day as directed by MD  I will include you on the e-mail notification  list for the Type 1 Support Group as you requested  Teaching Method Utilized: Auditory  Handouts given during visit include:  DKA guidelines for insulin pump users  Handwritten handout on BG curves with high fat meals  Barriers to learning/adherence to lifestyle change: stress of being a single mom with DM 1 that is not well controlled  Diabetes self-care support plan:   [facility] Type 1 support group  Demonstrated degree of understanding via:  Teach Back   Monitoring/Evaluation:  Dietary intake, exercise, SMBG after meals too, and body weight in 1 week(s) to start CGM with iPRo.  Offered further education after she wears the CGM to discuss healthier eating habits and efficient ways to prepare meals at home.

## 2013-03-25 ENCOUNTER — Encounter: Payer: Medicaid Other | Admitting: *Deleted

## 2013-03-25 DIAGNOSIS — E1065 Type 1 diabetes mellitus with hyperglycemia: Secondary | ICD-10-CM

## 2013-03-25 DIAGNOSIS — IMO0002 Reserved for concepts with insufficient information to code with codable children: Secondary | ICD-10-CM

## 2013-03-27 NOTE — Progress Notes (Signed)
iPro Set Up  Time arrived:1300  Time left: 1330 Patient here for placement of iPro2 Continuous Glucose Monitor  Explained to patient purpose of wearing the iPro per MD orders. They expressed verbal understanding.  Sensor inserted into skin at least 2 inches from any insulin injection sites. Patient instructed to check BG at least 4 times each day. Explained to patient how to complete the iPro2 Log Sheet including time of all BG, food intake, exercise and any diabetes medications.  Patient instructed to return on Monday, 2/03/30/13 for removal of sensor and iPro2 along with the completed Log Sheet  iPro2 Recorder attached to sensor and taped down.  Patient informed that when iPro2 and sensor are connected, the system is waterproof and that they are to wear it consistently until they return to this office.  Patient to call this office if any questions or concerns prior to appointment to return the iPro2 for downloading.

## 2013-04-06 ENCOUNTER — Ambulatory Visit (INDEPENDENT_AMBULATORY_CARE_PROVIDER_SITE_OTHER): Payer: Medicaid Other | Admitting: Family Medicine

## 2013-04-06 VITALS — BP 101/71 | HR 88 | Temp 99.0°F | Ht 63.0 in | Wt 116.9 lb

## 2013-04-06 DIAGNOSIS — J029 Acute pharyngitis, unspecified: Secondary | ICD-10-CM

## 2013-04-06 DIAGNOSIS — J020 Streptococcal pharyngitis: Secondary | ICD-10-CM

## 2013-04-06 LAB — POCT RAPID STREP A (OFFICE): RAPID STREP A SCREEN: NEGATIVE

## 2013-04-06 NOTE — Progress Notes (Signed)
   Subjective:    Patient ID: Sierra Schneider, female    DOB: [birth year], 28 y.o.   MRN: [id number]  HPI 28 y/o female presents for evaluation of sore throat, present for 1 day, associated cough and nasal congestion, no fevers, no chills, no ear fullness/pain, daughter was diagnosed with strep throat over the weekend, patient is concerned that she may have strep throat as well, no swollen lymph nodes, no nausea, no emesis, no GI upset   Review of Systems  Constitutional: Negative for chills and fatigue.  HENT: Positive for congestion and postnasal drip. Negative for nosebleeds.   Respiratory: Positive for cough. Negative for shortness of breath.   Cardiovascular: Negative for chest pain.  Gastrointestinal: Negative for nausea, vomiting and diarrhea.       Objective:   Physical Exam Vitals: Reviewed General: Pleasant Caucasian female, no acute distress HEENT: Normocephalic, pupils chronic to light, extraocular motion intact, nasal septum midline, mild rhinorrhea present, no pharyngeal erythema present without exudate, moist mucous membranes, bilateral TMs are pearly-gray without erythema or bulging, neck was supple, no anterior or posterior cervical lymphadenopathy Cardiac: Regular in rhythm, S1 and S2 present, no murmurs, no heaves or thrills Respiratory: Clear to auscultation bilaterally, normal effort Skin: No rash   Rapid strep negative    Assessment & Plan:  Please see problem specific assessment and plan.

## 2013-04-06 NOTE — Assessment & Plan Note (Signed)
Acute pharyngitis of likely viral origin. Rapid strep negative. -Conservative management discussed as outlined in patient instructions

## 2013-04-06 NOTE — Patient Instructions (Signed)
Pharyngitis °Pharyngitis is redness, pain, and swelling (inflammation) of your pharynx.  °CAUSES  °Pharyngitis is usually caused by infection. Most of the time, these infections are from viruses (viral) and are part of a cold. However, sometimes pharyngitis is caused by bacteria (bacterial). Pharyngitis can also be caused by allergies. Viral pharyngitis may be spread from person to person by coughing, sneezing, and personal items or utensils (cups, forks, spoons, toothbrushes). Bacterial pharyngitis may be spread from person to person by more intimate contact, such as kissing.  °SIGNS AND SYMPTOMS  °Symptoms of pharyngitis include:   °· Sore throat.   °· Tiredness (fatigue).   °· Low-grade fever.   °· Headache. °· Joint pain and muscle aches. °· Skin rashes. °· Swollen lymph nodes. °· Plaque-like film on throat or tonsils (often seen with bacterial pharyngitis). °DIAGNOSIS  °Your health care provider will ask you questions about your illness and your symptoms. Your medical history, along with a physical exam, is often all that is needed to diagnose pharyngitis. Sometimes, a rapid strep test is done. Other lab tests may also be done, depending on the suspected cause.  °TREATMENT  °Viral pharyngitis will usually get better in 3 4 days without the use of medicine. Bacterial pharyngitis is treated with medicines that kill germs (antibiotics).  °HOME CARE INSTRUCTIONS  °· Drink enough water and fluids to keep your urine clear or pale yellow.   °· Only take over-the-counter or prescription medicines as directed by your health care provider:   °· If you are prescribed antibiotics, make sure you finish them even if you start to feel better.   °· Do not take aspirin.   °· Get lots of rest.   °· Gargle with 8 oz of salt water (½ tsp of salt per 1 qt of water) as often as every 1 2 hours to soothe your throat.   °· Throat lozenges (if you are not at risk for choking) or sprays may be used to soothe your throat. °SEEK MEDICAL  CARE IF:  °· You have large, tender lumps in your neck. °· You have a rash. °· You cough up green, yellow-brown, or bloody spit. °SEEK IMMEDIATE MEDICAL CARE IF:  °· Your neck becomes stiff. °· You drool or are unable to swallow liquids. °· You vomit or are unable to keep medicines or liquids down. °· You have severe pain that does not go away with the use of recommended medicines. °· You have trouble breathing (not caused by a stuffy nose). °MAKE SURE YOU:  °· Understand these instructions. °· Will watch your condition. °· Will get help right away if you are not doing well or get worse. °Document Released: 02/12/2005 Document Revised: 12/03/2012 Document Reviewed: 10/20/2012 °Exit[employer]® Patient Information ©2014 [employer]. ° °

## 2013-04-10 ENCOUNTER — Ambulatory Visit (INDEPENDENT_AMBULATORY_CARE_PROVIDER_SITE_OTHER): Payer: Medicaid Other | Admitting: Endocrinology

## 2013-04-10 ENCOUNTER — Encounter: Payer: Self-pay | Admitting: Endocrinology

## 2013-04-10 ENCOUNTER — Other Ambulatory Visit: Payer: Self-pay | Admitting: Endocrinology

## 2013-04-10 VITALS — BP 110/68 | HR 102 | Temp 97.9°F | Resp 14 | Ht 63.0 in | Wt 117.6 lb

## 2013-04-10 DIAGNOSIS — IMO0002 Reserved for concepts with insufficient information to code with codable children: Secondary | ICD-10-CM

## 2013-04-10 DIAGNOSIS — E1065 Type 1 diabetes mellitus with hyperglycemia: Secondary | ICD-10-CM

## 2013-04-10 DIAGNOSIS — E7800 Pure hypercholesterolemia, unspecified: Secondary | ICD-10-CM

## 2013-04-10 LAB — BASIC METABOLIC PANEL
BUN: 9 mg/dL (ref 6–23)
CALCIUM: 9.5 mg/dL (ref 8.4–10.5)
CO2: 28 mEq/L (ref 19–32)
CREATININE: 0.63 mg/dL (ref 0.50–1.10)
Chloride: 98 mEq/L (ref 96–112)
Glucose, Bld: 305 mg/dL — ABNORMAL HIGH (ref 70–99)
Potassium: 4.1 mEq/L (ref 3.5–5.3)
Sodium: 134 mEq/L — ABNORMAL LOW (ref 135–145)

## 2013-04-10 LAB — HEMOGLOBIN A1C
Hgb A1c MFr Bld: 8.2 % — ABNORMAL HIGH (ref <5.7)
MEAN PLASMA GLUCOSE: 189 mg/dL — AB (ref <117)

## 2013-04-10 NOTE — Progress Notes (Signed)
Patient ID: Sierra Schneider, female   DOB: 1985/09/02, 28 y.o.   MRN: [id number]   Reason for Appointment: Insulin Pump followup:   History of Present Illness   Diagnosis: Type 1 DIABETES MELITUS     DIABETES history: She has had diabetes since about 2011 and has been on insulin pump for at least 2 years She had fairly good control in the first 2 years of her disease but blood sugar has been more difficult in the last year at least CURRENT insulin pump:  One Touch Ping  She is here for a three-month followup A1c today is pending but in 11/14 it was up to 9.4 Her blood sugars are still overall relatively high and variable without significant hypoglycemia Her sugar patterns are better identified with her continuous glucose monitoring recently done   Problems identified are as follows: 1. Eating consistently high fat meals especially at lunchtime when she is eating out fast food. She is still not covering the fat content with extra insulin 2. Often eating high glycemic index snacks in the evenings and sometimes overnight causing high readings fasting 3. Often not bolusing for snacks during the night with high readings in the mornings consequently 4. Variability in blood sugars, probably explained by variable diet and not enough boluses for high fat meals or sometimes for snacks at night 5. Not checking blood sugars after meals to help identify postprandial patterns 6. Rare hypoglycemia on waking up 7. Checking blood sugars only 1.9 times a day on an average and sometimes not checking them at mealtimes including lunchtime  The pump SETTINGS are: Basal rate: Midnight = 0.8, 6 AM = 0.85 , 9 AM = 0.6, 6 PM = 0.55 and 7 PM = 0.4 Carb Ratio: 1: 10. Correction factor 1: 30, target 100  GLUCOSE readings from her pump download today as follows:   PREMEAL Breakfast Lunch Dinner Bedtime Overall  Glucose range:  41-333   68-281   137-272   108-294    Mean/median:      171/156   HYPERGLYCEMIA  Occuring  at all different times as above  HYPOGLYCEMIC episodes are rare, ,lowest glucose  41 fasting 02/01/14   CGM RECORD INTERPRETATION    Dates of Recording: 03/25/13 through 03/28/13       Indications: Worsening glycemic control. Variable blood sugars and unpredictable hyperglycemia and hypoglycemia. Identification of postprandial patterns and patterns of overnight glucoses     Sensor  summary:  Quality of the data is excellent with adequate sensor function through 03/28/12. Glucose records show overall average 141 with a range of 40-275     Glycemic patterns:  Blood sugars are decreasing overnight blood sugars in the hypoglycemic range on 1/29. Blood sugars appear to be mildly increased after her breakfast showed delayed hyperglycemia after lunch the also has dinner on 1/29 and 1/30     Overnight periods:  Sugars are variable with downward trend for the night, relatively high at bedtime on 2 of the nights. Blood sugar in the hypoglycemic range transiently around 2 AM on 1/29 but below 100 for most of the night     Preprandial periods:  Fasting blood sugar was low on 1/29, relatively high on 1/30 with a Dawn phenomenon. Blood sugars before lunch and dinner are averaging 116 and 138     Postprandial periods:  Appears to have lunch meals and more rapid hyperglycemia after her evening meal average blood sugar before breakfast 78, after breakfast 151 average blood sugar before lunch  116 and after lunch 170. Analysis not done for her evening meal when she is eating mostly a sweet snack is raising her glucose     Hypoglycemia: This occurred after any meal on 1/28, carbohydrate intake not recorded but also mild transient hypoglycemia at 2 AM and 7-8 AM on 1/29     Recommendations Reduce basal rates around 6-7 AM. Increased duration of combination bolus at lunchtime for about 4 hours and increase the carbohydrate coverage at lunch. May possibly need a higher basal rate in the late afternoon also.  Also need increased carbohydrate coverage at dinner or else have more balanced meals since evening meals are mostly sweets      LABS: Lab Results  Component Value Date   HGBA1C 9.4* 12/29/2012   HGBA1C 7.8* 10/14/2012   HGBA1C 7.8* 10/13/2012   Lab Results  Component Value Date   MICROALBUR 2.1* 12/29/2012   CREATININE 0.5 12/29/2012     Office Visit on 04/06/2013  Component Date Value Ref Range Status  . Rapid Strep A Screen 04/06/2013 Negative  Negative Final      Medication List       This list is accurate as of: 04/10/13  8:25 AM.  Always use your most recent med list.               fexofenadine 180 MG tablet  Commonly known as:  ALLEGRA  Take 1 tablet (180 mg total) by mouth daily.     fluticasone 50 MCG/ACT nasal spray  Commonly known as:  FLONASE  Place 1 spray into the nose 2 (two) times daily.     insulin aspart 100 UNIT/ML injection  Commonly known as:  NOVOLOG  Inject 40-50 Units into the skin continuous. Per insulin pump     sertraline 50 MG tablet  Commonly known as:  ZOLOFT  Take 4 tablets (200 mg total) by mouth daily.        Allergies: No Known Allergies  Past Medical History  Diagnosis Date  . Diabetes mellitus 08/2009    late onset type 1    Past Surgical History  Procedure Laterality Date  . Cesarean section    . No past surgeries    . Esophagogastroduodenoscopy Left 10/14/2012    Procedure: ESOPHAGOGASTRODUODENOSCOPY (EGD);  Surgeon: Vertell NovakJames L Edwards Jr., MD;  [facility];  Service: Endoscopy;  Laterality: Left;    Family History  Problem Relation Age of Onset  . Depression Mother   . Depression Father   . Kidney disease Paternal Grandmother     Social History:  reports that she has been smoking Cigarettes.  She has been smoking about 1.00 pack per day. She has never used smokeless tobacco. She reports that she drinks alcohol. She reports that she does not use illicit drugs.  REVIEW of systems:  No history of  hypertension  She has had mild hypercholesterolemia which is relatively modest. She has a relatively high fat dietespecially at lunch   Lab Results  Component Value Date   CHOL 208* 12/29/2012   HDL 60.50 12/29/2012   LDLDIRECT 129.4 12/29/2012   TRIG 65.0 12/29/2012   CHOLHDL 3 12/29/2012     EXAM:  BP 110/68  Pulse 102  Temp(Src) 97.9 F (36.6 C)  Resp 14  Ht 5\' 3"  (1.6 m)  Wt 117 lb 9.6 oz (53.343 kg)  BMI 20.84 kg/m2  SpO2 96%   ASSESSMENT:  DIABETES: Her blood sugars are somewhat better than on her last visit but still  inconsistently controlled at all different times See history of present illness for details of her current management, blood sugar patterns   Her main issue is still with  Compliance with various aspects of day-to-day management as follows:  Not checking blood sugar consistently with every meal  No readings after meals to help adjust mealtime boluses  High-fat meals making it difficult to cover her postprandial hyperglycemia especially in the afternoon when she has a high fat lunch  Overnight snacks causing fasting hyperglycemia on a few occasions  No exercise  May not be covering all snacks the boluses especially during the night  Recommendations: Consider reducing basal rate around 6-7 AM, however recent fasting readings are high.  Increased duration of combination bolus at lunchtime for about 4 hours  Increase the carbohydrate coverage through 1:8  Needs a higher basal rate in the late afternoon and will change 3 PM basal rate to 0.65  Need to have more balanced meals since evening meals are mostly sweets   HYPERCHOLESTEROLEMIA:She will cut back on saturated fat to help with hypercholesterolemia  Counseling time over 50% of today's 25 minute visit  Esker Dever 04/10/2013, 8:25 AM

## 2013-04-11 NOTE — Progress Notes (Signed)
Quick Note:  Please let patient know that the A1c is slightly better at 8.2, continue to work on her control, lab glucose was 305 ______

## 2013-05-06 ENCOUNTER — Other Ambulatory Visit: Payer: Self-pay | Admitting: Family Medicine

## 2013-06-29 ENCOUNTER — Ambulatory Visit (INDEPENDENT_AMBULATORY_CARE_PROVIDER_SITE_OTHER): Payer: Medicaid Other | Admitting: Family Medicine

## 2013-06-29 ENCOUNTER — Encounter: Payer: Self-pay | Admitting: Family Medicine

## 2013-06-29 VITALS — BP 114/76 | HR 94 | Ht 63.0 in | Wt 116.8 lb

## 2013-06-29 DIAGNOSIS — M62838 Other muscle spasm: Secondary | ICD-10-CM

## 2013-06-29 DIAGNOSIS — F411 Generalized anxiety disorder: Secondary | ICD-10-CM

## 2013-06-29 DIAGNOSIS — M549 Dorsalgia, unspecified: Secondary | ICD-10-CM

## 2013-06-29 MED ORDER — MELOXICAM 15 MG PO TABS: 15.0000 mg | ORAL_TABLET | Freq: Every day | ORAL | Status: DC

## 2013-06-29 MED ORDER — BUSPIRONE HCL 15 MG PO TABS: 15.0000 mg | ORAL_TABLET | Freq: Three times a day (TID) | ORAL | Status: DC | PRN

## 2013-06-29 MED ORDER — CYCLOBENZAPRINE HCL 10 MG PO TABS: 10.0000 mg | ORAL_TABLET | Freq: Three times a day (TID) | ORAL | Status: DC | PRN

## 2013-06-29 NOTE — Assessment & Plan Note (Signed)
A: muscle spasms without impingement  P: NSAIDS + muscle relaxer PRN with recommendation for stretching and heating pad as well

## 2013-06-29 NOTE — Progress Notes (Signed)
   Subjective:    Patient ID: Sierra Schneider, female    DOB: [birth year], 28 y.o.   MRN: [id number]  HPI  28 year old with Type 1 DM who presents for follow up.   Anxiety - Currently taking sertraline 200 mg PO daily for depression.  > Main stressors - children's activities everyday after work, being late to these activities and feeling overwhelmed  > Coping mechanisms -  Smoke cigarettes, quiet time at night after kids have gone to bed > Support system - brother and mother very supportive > Alcohol or drug use - minimal alcohol use (< 1 drink per week), no drugs  > Caffeine use - drinks coffee "all day at work" , estimated 8 cups per day (currently drinking 20 ounce coffee in exam room during interview and says it's her 3rd cup of the day) > Pt proposed solutions for improvement  - asked her mother for assistance with the children one night per week so she could go home after work to [employer] laundry and home care things; also expects this to improve in the summer when kids are out of school  Enjoys her job at [employer]    MSK - The patient complains of muscle spasms of her shoulders and back due to prolonged sitting at work. She has tried anti-inflammatories and muscle relaxer successfully in the past. She would like a refill. She denies any numbness or weakness of her upper extremities. She denies any trauma.     Review of Systems Negative for depression, chest pain, SOB    Objective:   Physical Exam BP 114/76  Pulse 94  Ht 5\' 3"  (1.6 m)  Wt 116 lb 12.8 oz (52.98 kg)  BMI 20.70 kg/m2  Gen: healthy well appearing female, pleasant MSK: trigger points along trapezius bilaterally, normal ROM of neck with negative spurlings Psych: dynamic affect, rapid speech but no flight of ideas, no SI/HI      Assessment & Plan:

## 2013-06-29 NOTE — Patient Instructions (Addendum)
Dear Ms. Carleene MainsGurkin,   Thank you for coming to clinic today. Please read below regarding the issues that we discussed.   1. Anxiety - You are doing a good job of working hard for the kids, but you have to care for yourself as well. It's great that your mom can take the kids for one night per week. Also, I recommend that you reduce the coffee intake dramatically. I recommend that you substitute for de-caff with at least 4 cups per day. Also, you should take the BuSpar twice per days for the next few weeks. All of these things will help.   2. Muscle Spasms - Regular stretching and heating pads are great. I have also sent prescriptions for the anti-inflammatory and muscle relaxer to the pharmacy.  Please follow up in clinic in 1 month. Please call earlier if you have any questions or concerns.   Sincerely,   Dr. Clinton SawyerWilliamson

## 2013-06-29 NOTE — Assessment & Plan Note (Signed)
A: GAD: 17 clearly with genetic component since this started as a pre-teen and numerous environmental stressors, but thankfully no destructive habits P:  - Start buspar 15 mg TID PRN (recommended BID for next 2 weeks) - Decrease caffeine from 8 cups of coffee to 4 cups per day - Allow her mother to help with the kids one night per week - F/u in 1 month

## 2013-06-30 ENCOUNTER — Telehealth: Payer: Self-pay

## 2013-06-30 ENCOUNTER — Other Ambulatory Visit: Payer: Self-pay | Admitting: *Deleted

## 2013-06-30 MED ORDER — INSULIN ASPART 100 UNIT/ML ~~LOC~~ SOLN: 40.0000 [IU] | SUBCUTANEOUS | Status: DC

## 2013-06-30 NOTE — Telephone Encounter (Signed)
The patient called hoping to get a refill on her Novolog.  [facility] uses the [facility] on [address]   Pt Callback - [phone]

## 2013-07-01 ENCOUNTER — Other Ambulatory Visit: Payer: Self-pay | Admitting: *Deleted

## 2013-07-01 MED ORDER — INSULIN ASPART 100 UNIT/ML ~~LOC~~ SOLN: 40.0000 [IU] | SUBCUTANEOUS | Status: DC

## 2013-07-03 ENCOUNTER — Ambulatory Visit (INDEPENDENT_AMBULATORY_CARE_PROVIDER_SITE_OTHER): Payer: Medicaid Other | Admitting: Family Medicine

## 2013-07-03 ENCOUNTER — Encounter: Payer: Self-pay | Admitting: Family Medicine

## 2013-07-03 VITALS — BP 118/76 | HR 101 | Temp 98.4°F | Ht 64.0 in | Wt 120.1 lb

## 2013-07-03 DIAGNOSIS — H6690 Otitis media, unspecified, unspecified ear: Secondary | ICD-10-CM | POA: Insufficient documentation

## 2013-07-03 MED ORDER — ANTIPYRINE-BENZOCAINE 5.4-1.4 % OT SOLN: 3.0000 [drp] | OTIC | Status: DC | PRN

## 2013-07-03 MED ORDER — AMOXICILLIN-POT CLAVULANATE 875-125 MG PO TABS: 1.0000 | ORAL_TABLET | Freq: Two times a day (BID) | ORAL | Status: DC

## 2013-07-03 NOTE — Patient Instructions (Signed)
Dear [name],   It was nice to see today. Certainly her having pain in her ear. Does look infected. We need to start antibiotics today for the infection. Please take the entire prescription for 10 days. Please let me know if you have any problems like reaction to the medication or elevated blood sugar.  Sincerely,  Dr. Clinton SawyerWilliamson

## 2013-07-03 NOTE — Assessment & Plan Note (Signed)
A: right-sided otitis media which is supportive P: treat patient with Augmentin 875/125 by mouth twice a day x10 days, given her type 1 diabetes we must be more aggressive in treatment of all infections

## 2013-07-03 NOTE — Progress Notes (Signed)
   Subjective:    Patient ID: Sierra Schneider, female    DOB: 1985/04/01, 28 y.o.   MRN: [id number]  HPI  28 year old F with hx of Type 1 DM and anxiety/depression who present for a same-day appointment for evaluation of sinus pain.    URI Symptoms Cough: yes productive yes - productive  Runny Nose: no Sore Throat: yes Sinus Pressure: yes Shortness of Breath: difficulty breathing  Fever/Chills: no Nausea/Vomiting; no Diarrhea: no  Course: 1 day and worsening Treatments Tried: ibuprofen Exacerbating: movement of ear    Current Outpatient Prescriptions on File Prior to Visit  Medication Sig Dispense Refill  . busPIRone (BUSPAR) 15 MG tablet Take 1 tablet (15 mg total) by mouth 3 (three) times daily as needed.  90 tablet  1  . cyclobenzaprine (FLEXERIL) 10 MG tablet Take 1 tablet (10 mg total) by mouth 3 (three) times daily as needed for muscle spasms.  30 tablet  0  . fexofenadine (ALLEGRA) 180 MG tablet Take 1 tablet (180 mg total) by mouth daily.  30 tablet  1  . fluticasone (FLONASE) 50 MCG/ACT nasal spray Place 1 spray into the nose 2 (two) times daily.  1 g  2  . insulin aspart (NOVOLOG) 100 UNIT/ML injection Inject 40-50 Units into the skin continuous. Per insulin pump  3 vial  2  . meloxicam (MOBIC) 15 MG tablet Take 1 tablet (15 mg total) by mouth daily.  30 tablet  1  . sertraline (ZOLOFT) 50 MG tablet TAKE 4 TABLETS BY MOUTH DAILYNEED TO SCHEDULE AN APPT PER MD**  120 tablet  1   No current facility-administered medications on file prior to visit.    Review of Systems Positive for worsening blood sugar control    Objective:   Physical Exam Blood pressure 118/76, pulse 101, temperature 98.4 F (36.9 C), temperature source Oral, height 5\' 4"  (1.626 m), weight 120 lb 1.6 oz (54.477 kg).  Gen: young white female, mildly ill appearing, NAD, pleasant and conversant HEENT: NCAT, PERRLA, EOMI, OP clear and moist, no oropharyngeal exudate, no lymphadenopathy, no thyroid  tenderness, enlargement, or nodules, neck with normal ROM, no meningismus, right tympanic membrane erythematous with supportive effusion of the middle ear and margin otalgia; left tympanic membrane normal CV: RRR, no m/r/g, no JVD or carotid bruits Pulm: normal WOB, CTA-B Abd: soft, NDNT, NABS Extremities: no edema or joint tenderness Skin: warm, dry, no rashes Neuro/Psych: A&Ox4, normal affect, speech, and thought content       Assessment & Plan:

## 2013-07-06 ENCOUNTER — Other Ambulatory Visit: Payer: Self-pay | Admitting: *Deleted

## 2013-07-06 MED ORDER — INSULIN ASPART 100 UNIT/ML ~~LOC~~ SOLN: 40.0000 [IU] | SUBCUTANEOUS | Status: DC

## 2013-07-10 ENCOUNTER — Ambulatory Visit: Payer: Medicaid Other | Admitting: Endocrinology

## 2013-08-01 ENCOUNTER — Other Ambulatory Visit: Payer: Self-pay | Admitting: Family Medicine

## 2013-08-17 ENCOUNTER — Telehealth: Payer: Self-pay | Admitting: Family Medicine

## 2013-08-19 NOTE — Telephone Encounter (Signed)
Spoke to Pt about DM care. Pt did not have know her availability at this time but will call back soon. When Pt calls back please schedule appointment to check LDL.

## 2013-08-24 ENCOUNTER — Emergency Department (HOSPITAL_COMMUNITY)
Admission: EM | Admit: 2013-08-24 | Discharge: 2013-08-24 | Discharge status: Against Medical Advice | Payer: Medicaid Other | Attending: Emergency Medicine | Admitting: Emergency Medicine

## 2013-08-24 ENCOUNTER — Encounter (HOSPITAL_COMMUNITY): Payer: Self-pay | Admitting: Emergency Medicine

## 2013-08-24 DIAGNOSIS — E119 Type 2 diabetes mellitus without complications: Secondary | ICD-10-CM | POA: Insufficient documentation

## 2013-08-24 DIAGNOSIS — R220 Localized swelling, mass and lump, head: Secondary | ICD-10-CM | POA: Insufficient documentation

## 2013-08-24 DIAGNOSIS — F17200 Nicotine dependence, unspecified, uncomplicated: Secondary | ICD-10-CM | POA: Insufficient documentation

## 2013-08-24 DIAGNOSIS — R221 Localized swelling, mass and lump, neck: Principal | ICD-10-CM

## 2013-08-24 NOTE — ED Notes (Signed)
Pt states she is leaving and does not wish to wait any longer  Encouraged to stay  Pt declined

## 2013-08-24 NOTE — ED Notes (Signed)
Pt c/o left jaw swelling and pain, that started this weekend and has gotten progressively worse. Pt states it hurts to smile and eat.

## 2013-08-25 ENCOUNTER — Ambulatory Visit (INDEPENDENT_AMBULATORY_CARE_PROVIDER_SITE_OTHER): Payer: Medicaid Other | Admitting: Family Medicine

## 2013-08-25 ENCOUNTER — Encounter: Payer: Self-pay | Admitting: Family Medicine

## 2013-08-25 VITALS — BP 108/73 | HR 81 | Temp 98.3°F | Ht 64.0 in | Wt 116.0 lb

## 2013-08-25 DIAGNOSIS — K1120 Sialoadenitis, unspecified: Secondary | ICD-10-CM | POA: Insufficient documentation

## 2013-08-25 DIAGNOSIS — M26609 Unspecified temporomandibular joint disorder, unspecified side: Secondary | ICD-10-CM

## 2013-08-25 DIAGNOSIS — R220 Localized swelling, mass and lump, head: Secondary | ICD-10-CM

## 2013-08-25 LAB — CBC WITH DIFFERENTIAL/PLATELET
BASOS ABS: 0 10*3/uL (ref 0.0–0.1)
Basophils Relative: 0 % (ref 0–1)
EOS PCT: 2 % (ref 0–5)
Eosinophils Absolute: 0.2 10*3/uL (ref 0.0–0.7)
HCT: 38.6 % (ref 36.0–46.0)
Hemoglobin: 13 g/dL (ref 12.0–15.0)
LYMPHS PCT: 28 % (ref 12–46)
Lymphs Abs: 2.5 10*3/uL (ref 0.7–4.0)
MCH: 30.3 pg (ref 26.0–34.0)
MCHC: 33.7 g/dL (ref 30.0–36.0)
MCV: 90 fL (ref 78.0–100.0)
Monocytes Absolute: 0.5 10*3/uL (ref 0.1–1.0)
Monocytes Relative: 6 % (ref 3–12)
NEUTROS ABS: 5.7 10*3/uL (ref 1.7–7.7)
Neutrophils Relative %: 64 % (ref 43–77)
PLATELETS: 285 10*3/uL (ref 150–400)
RBC: 4.29 MIL/uL (ref 3.87–5.11)
RDW: 13.4 % (ref 11.5–15.5)
WBC: 8.9 10*3/uL (ref 4.0–10.5)

## 2013-08-25 MED ORDER — SULFAMETHOXAZOLE-TMP DS 800-160 MG PO TABS: 1.0000 | ORAL_TABLET | Freq: Two times a day (BID) | ORAL | Status: DC

## 2013-08-25 MED ORDER — IBUPROFEN 600 MG PO TABS: 600.0000 mg | ORAL_TABLET | Freq: Three times a day (TID) | ORAL | Status: DC | PRN

## 2013-08-25 NOTE — Progress Notes (Signed)
Subjective:     Patient ID: Aldean JewettMelinda D Ahr, female   DOB: 07/20/1985, [age] y.o.   MRN: [id number]  HPI Lump: C/O lump of her left jaw bone started 4 days ago, gradually getting bigger and more painful. Pain is worse with chewing,mouth opening,laughing. Lump is about 8/10 in severity.Tylenol and Motrin used as need for pain. Denies any trauma or injury. Symptoms is worsening. No prior episode of similar presentation,no fever. No earache or discharge.  Current Outpatient Prescriptions on File Prior to Visit  Medication Sig Dispense Refill  . insulin aspart (NOVOLOG) 100 UNIT/ML injection Inject 40-50 Units into the skin continuous. Per insulin pump  3 vial  0  . busPIRone (BUSPAR) 15 MG tablet Take 1 tablet (15 mg total) by mouth 3 (three) times daily as needed.  90 tablet  1  . cyclobenzaprine (FLEXERIL) 10 MG tablet Take 1 tablet (10 mg total) by mouth 3 (three) times daily as needed for muscle spasms.  30 tablet  0  . fluticasone (FLONASE) 50 MCG/ACT nasal spray Place 1 spray into the nose 2 (two) times daily.  1 g  2   No current facility-administered medications on file prior to visit.    Past Medical History  Diagnosis Date  . Diabetes mellitus 08/2009    late onset type 1      Review of Systems  Constitutional: Negative for fever and fatigue.  HENT: Negative for dental problem, ear discharge, ear pain and hearing loss.        Lump on her left jaw.  Respiratory: Negative.   Cardiovascular: Negative.   Gastrointestinal: Negative.   All other systems reviewed and are negative.  Filed Vitals:   08/25/13 1041  BP: 108/73  Pulse: 81  Temp: 98.3 F (36.8 C)  TempSrc: Oral  Height: 5\' 4"  (1.626 m)  Weight: 116 lb (52.617 kg)       Objective:   Physical Exam  Nursing note and vitals reviewed. Constitutional: She appears well-developed. No distress.  HENT:  Head:    Cardiovascular: Normal rate, regular rhythm, normal heart sounds and intact distal pulses.   No murmur  heard. Pulmonary/Chest: Effort normal and breath sounds normal. No respiratory distress. She has no wheezes.  Abdominal: Soft. Bowel sounds are normal. She exhibits no distension and no mass. There is no tenderness. There is no guarding.  No hepatomegaly.       Assessment:     Sublingual gland swelling and tenderness      Plan:     Check problem list

## 2013-08-25 NOTE — Patient Instructions (Addendum)
  It was nice seeing you Sierra Schneider, I am however sorry you don't feel well. Your jaw lump could be a   lymph node swelling due to infection or mump which is less likely especially if you are up to date with your vaccination. Mononucleosis is a possibility but unlikely too. I will treat you with A/B for possible LN infection. I will check your HIV test today and CBC. Also use Ibuprofen which I prescribed as needed for pain.

## 2013-08-25 NOTE — Assessment & Plan Note (Addendum)
Sublingual salivary gland inflammation. Viral vs bacterial vs obstruction. Ibuprofen recommended prn pain. Bactrim prescribed. CBC, HIV checked. Anticipates improvement in few days. Patient instructed to return soon if she does not feel better.  NB: On AVS, I had written possible lymph node swelling, I called to clarify with patient swelling is lively due to salavary gland due to location. She verbalized understanding. Treatment remains same.

## 2013-08-26 ENCOUNTER — Encounter: Payer: Self-pay | Admitting: *Deleted

## 2013-08-26 ENCOUNTER — Telehealth: Payer: Self-pay | Admitting: *Deleted

## 2013-08-26 LAB — HIV ANTIBODY (ROUTINE TESTING W REFLEX): HIV 1&2 Ab, 4th Generation: NONREACTIVE

## 2013-08-26 NOTE — Telephone Encounter (Signed)
Letter mailed to patient since labs are normal. Jazmin Hartsell,CMA

## 2013-08-26 NOTE — Telephone Encounter (Signed)
Message copied by Henri MedalHARTSELL, JAZMIN M on Wed Aug 26, 2013 10:00 AM ------      Message from: Janit PaganENIOLA, KEHINDE T      Created: Wed Aug 26, 2013  9:24 AM       Please inform patient blood test is ok and HIV negative ------

## 2013-08-27 ENCOUNTER — Ambulatory Visit: Payer: Medicaid Other | Admitting: Family Medicine

## 2013-08-27 ENCOUNTER — Other Ambulatory Visit: Payer: Self-pay | Admitting: Endocrinology

## 2013-08-27 ENCOUNTER — Telehealth: Payer: Self-pay | Admitting: Endocrinology

## 2013-08-27 NOTE — Telephone Encounter (Signed)
Patient needs appointment.

## 2013-08-27 NOTE — Telephone Encounter (Signed)
Patient called stating that her insurance is not allowing more than 1 vial at a time  She needs a refill   [facility] [address]   Thank You

## 2013-09-13 ENCOUNTER — Other Ambulatory Visit: Payer: Self-pay | Admitting: Endocrinology

## 2013-09-14 ENCOUNTER — Telehealth: Payer: Self-pay | Admitting: Endocrinology

## 2013-09-14 ENCOUNTER — Other Ambulatory Visit: Payer: Self-pay | Admitting: *Deleted

## 2013-09-14 MED ORDER — INSULIN ASPART 100 UNIT/ML ~~LOC~~ SOLN: 40.0000 [IU] | SUBCUTANEOUS | Status: DC

## 2013-09-14 NOTE — Telephone Encounter (Signed)
Patient would like to speak with Dr. Lucianne MussKumar assistant  She states that her Novolog was declined   Please call patient at [phone]  Thank you

## 2013-09-14 NOTE — Telephone Encounter (Signed)
It was declined because she needs an appointment

## 2013-09-28 ENCOUNTER — Encounter: Payer: Self-pay | Admitting: Endocrinology

## 2013-09-28 ENCOUNTER — Ambulatory Visit (INDEPENDENT_AMBULATORY_CARE_PROVIDER_SITE_OTHER): Payer: Medicaid Other | Admitting: Endocrinology

## 2013-09-28 ENCOUNTER — Other Ambulatory Visit: Payer: Self-pay | Admitting: *Deleted

## 2013-09-28 VITALS — BP 98/63 | HR 97 | Temp 97.7°F | Resp 14 | Ht 63.0 in | Wt 119.2 lb

## 2013-09-28 DIAGNOSIS — E1065 Type 1 diabetes mellitus with hyperglycemia: Secondary | ICD-10-CM

## 2013-09-28 DIAGNOSIS — E7800 Pure hypercholesterolemia, unspecified: Secondary | ICD-10-CM

## 2013-09-28 DIAGNOSIS — IMO0002 Reserved for concepts with insufficient information to code with codable children: Secondary | ICD-10-CM

## 2013-09-28 LAB — BASIC METABOLIC PANEL
BUN: 9 mg/dL (ref 6–23)
CALCIUM: 8.5 mg/dL (ref 8.4–10.5)
CO2: 28 mEq/L (ref 19–32)
Chloride: 99 mEq/L (ref 96–112)
Creatinine, Ser: 0.6 mg/dL (ref 0.4–1.2)
GFR: 116.18 mL/min (ref >60.00)
Glucose, Bld: 295 mg/dL — ABNORMAL HIGH (ref 70–99)
POTASSIUM: 3.8 meq/L (ref 3.5–5.1)
SODIUM: 132 meq/L — AB (ref 135–145)

## 2013-09-28 LAB — MICROALBUMIN / CREATININE URINE RATIO
Creatinine,U: 84.2 mg/dL
MICROALB/CREAT RATIO: 0.2 mg/g (ref 0.0–30.0)
Microalb, Ur: 0.2 mg/dL (ref 0.0–1.9)

## 2013-09-28 LAB — HEMOGLOBIN A1C: HEMOGLOBIN A1C: 9.6 % — AB (ref 4.6–6.5)

## 2013-09-28 MED ORDER — INSULIN ASPART 100 UNIT/ML ~~LOC~~ SOLN: SUBCUTANEOUS | Status: DC

## 2013-09-28 NOTE — Progress Notes (Signed)
Quick Note:  Call: A1c very high at 9.6 and glucose 295 today. Use 1:8 carbohydrate ratio for all meals and call if fasting or supper time blood sugars are consistently over 150, needs better control  ______

## 2013-09-28 NOTE — Patient Instructions (Signed)
Carb coverage 1: [facility] and add 2-3 units for lunch and hi fat meals  Check sugars 3-4 x daily some after meals

## 2013-09-28 NOTE — Progress Notes (Signed)
Patient ID: Sierra JewettMelinda D Schneider, female   DOB: 1985/07/12, 28 y.o.   MRN: [id number]   Reason for Appointment: Insulin Pump followup:   History of Present Illness   Diagnosis: Type 1 DIABETES MELITUS     DIABETES history: She has had diabetes since about 2011 and has been on insulin pump for at least 2 years She had fairly good control in the first 2 years of her disease but blood sugar has been more difficult in the last year at least CURRENT insulin pump:  [employer]  She is here for  followup She appears to have had persistently high A1c since 2014 Also has been noncompliant with her followup She says that she has not had a functioning glucose monitor for the last couple of weeks and has only 3 blood sugar readings on her pump. She has been taking boluses at meal times empirically without checking her sugar or entering her carbohydrates in the pump On her last visit she was recommended a higher coverage of insulin for lunch and to extend the bolus for up to 4 hours because of having high fat meals but she has not done so. Currently using only 18% of her boluses as combination boluses   Problems identified are as follows: 1. Eating consistently high fat meals at lunchtime when she is eating out fast food. She is still not covering the fat content with more than one unit bolus. She does occasionally use the combination bolus as above 2. Periodically will have snacks late at night and she thinks these will cause occasional high readings in the mornings, has high readings on 2 of the mornings on her pump 3. Often not bolusing sufficiently for snacks during the night; however on her previous continuous glucose monitoring she tended to have relatively low readings at 6 AM  4. Probably not checking readings after her evening meal 5. Her pump display is quite faded  The pump SETTINGS are: Basal rate: Midnight = 0.8, 6 AM = 0.85 , 9 AM = 0.6, 6 PM = 0.55 and 7 PM = 0.4 Carb Ratio: 1: 10.  Correction factor 1: 30, target 100  GLUCOSE readings last month showed fasting of 210, 286 and lunchtime of 134 She does not think she has had much hypoglycemia lately   Wt Readings from Last 3 Encounters:  09/28/13 119 lb 3.2 oz (54.069 kg)  08/25/13 116 lb (52.617 kg)  07/03/13 120 lb 1.6 oz (54.477 kg)     LABS: Lab Results  Component Value Date   HGBA1C 8.2* 04/10/2013   HGBA1C 9.4* 12/29/2012   HGBA1C 7.8* 10/14/2012   Lab Results  Component Value Date   MICROALBUR 2.1* 12/29/2012   CREATININE 0.63 04/10/2013         Medication List       This list is accurate as of: 09/28/13  9:49 AM.  Always use your most recent med list.               busPIRone 15 MG tablet  Commonly known as:  BUSPAR  Take 1 tablet (15 mg total) by mouth 3 (three) times daily as needed.     cyclobenzaprine 10 MG tablet  Commonly known as:  FLEXERIL  Take 1 tablet (10 mg total) by mouth 3 (three) times daily as needed for muscle spasms.     fluticasone 50 MCG/ACT nasal spray  Commonly known as:  FLONASE  Place 1 spray into the nose 2 (two) times daily.  ibuprofen 600 MG tablet  Commonly known as:  ADVIL,MOTRIN  Take 1 tablet (600 mg total) by mouth every 8 (eight) hours as needed.     insulin aspart 100 UNIT/ML injection  Commonly known as:  NOVOLOG  Use 50 units per day in insulin pump     sertraline 50 MG tablet  Commonly known as:  ZOLOFT  TAKE 4 TABLETS BY MOUTH DAILY NEED TO SCHEDULE AN APPT PER MD        Allergies: No Known Allergies  Past Medical History  Diagnosis Date  . Diabetes mellitus 08/2009    late onset type 1    Past Surgical History  Procedure Laterality Date  . Cesarean section    . No past surgeries    . Esophagogastroduodenoscopy Left 10/14/2012    Procedure: ESOPHAGOGASTRODUODENOSCOPY (EGD);  Surgeon: Vertell Novak., MD;  Location: [facility] ENDOSCOPY;  Service: Endoscopy;  Laterality: Left;    Family History  Problem Relation Age of Onset  .  Depression Mother   . Depression Father   . Kidney disease Paternal Grandmother     Social History:  reports that she has been smoking Cigarettes.  She has been smoking about 1.00 pack per day. She has never used smokeless tobacco. She reports that she drinks alcohol. She reports that she does not use illicit drugs.  REVIEW of systems:  No history of hypertension  She has had mild hypercholesterolemia. She has a relatively high fat diet especially at lunch   Lab Results  Component Value Date   CHOL 208* 12/29/2012   HDL 60.50 12/29/2012   LDLDIRECT 129.4 12/29/2012   TRIG 65.0 12/29/2012   CHOLHDL 3 12/29/2012     EXAM:  BP 98/63  Pulse 97  Temp(Src) 97.7 F (36.5 C)  Resp 14  Ht 5\' 3"  (1.6 m)  Wt 119 lb 3.2 oz (54.069 kg)  BMI 21.12 kg/m2  SpO2 96%   ASSESSMENT:  DIABETES: Her blood sugars are difficult to assess because of lack of home glucose monitoring and no A1c available today See history of present illness for details of her current management, blood sugar patterns   Her main issue is still with  Compliance with various aspects of day-to-day management as discussed above  Recommendations:   She needs to call the pump company to get a new meter in order to restart glucose monitoring right away  Discussed frequency and timing of glucose monitoring as well as blood sugar targets including postprandial  If blood sugars are high postprandial he will need to change carbohydrate ratio again  She needs to bolus extra 2-4 units for high fat lunch is and continue extended wave boluses at meals for high fat intake  Increase the carbohydrate coverage at lunch also to 1:8   Try to cover any snacks especially at night adequately  Call if blood sugars are high persistently on waking up  She will look into the newer insulin pump with a continuous glucose monitoring  Currently needs a new pump  because of the fading display and given her information on the new pump with  the DexCom sensor  Regular followup every 3 months  Counseling time over 50% of today's 29 minute visit  Sierra Schneider 09/28/2013, 9:49 AM    Addendum: A1c 9.6. Use 1:8 carbohydrate ratio for all meals and call if fasting or supper time blood sugars are consistently high

## 2013-09-30 ENCOUNTER — Telehealth: Payer: Self-pay | Admitting: Endocrinology

## 2013-09-30 NOTE — Telephone Encounter (Signed)
Pt. Called saying that pump died last evening.  She called the on call nurse, and was told to take 4u q 4 hours.  Blood sugar this Am was 366 @ 7AM.  She gave herself 6u, and at 8AM blood sugar is 261.    Ketones are 4+.  She is nauseated, but no vomiting.  Is able to keep fluids  She does not want to come and get a Lantus pen as I suggested.  Said her new pump will be her tomorrow AM, and is willing to take Humalog q 4 hours.   Plan:  Drink lots of fluids: .8 ounces q hr.,  take 4u now, and in 2 hours retest blood sugars, and give Humalog q 4 hours.  The dose will be: the amount of  what your basal rate is that you would have missed for the next 4 hours, plus a correction of 1u/30 points over 100.  Plus meal time coverage: 1u/8 grams.   Call if vomiting, or not able to keep fluids down. She reported good understanding of this. I gave her her basal rates, and correction.  We reviewed this again, and she reported good understanding of what to do.

## 2013-10-01 ENCOUNTER — Telehealth: Payer: Self-pay | Admitting: Family Medicine

## 2013-10-01 DIAGNOSIS — M62838 Other muscle spasm: Secondary | ICD-10-CM

## 2013-10-01 NOTE — Telephone Encounter (Signed)
Patient would like for you to call as soon as possible she stated that her pump stopped working, and she can't any information off of it.

## 2013-10-01 NOTE — Telephone Encounter (Signed)
Pt called and would like a refill on her Flexeril called in. jw °

## 2013-10-04 MED ORDER — CYCLOBENZAPRINE HCL 10 MG PO TABS: 10.0000 mg | ORAL_TABLET | Freq: Three times a day (TID) | ORAL | Status: DC | PRN

## 2013-10-08 ENCOUNTER — Telehealth: Payer: Self-pay | Admitting: Endocrinology

## 2013-10-08 NOTE — Telephone Encounter (Signed)
Patient is having trouble with her pump.

## 2013-12-14 ENCOUNTER — Ambulatory Visit (INDEPENDENT_AMBULATORY_CARE_PROVIDER_SITE_OTHER): Payer: Medicaid Other | Admitting: Family Medicine

## 2013-12-14 ENCOUNTER — Other Ambulatory Visit (HOSPITAL_COMMUNITY)
Admission: RE | Admit: 2013-12-14 | Discharge: 2013-12-14 | Disposition: A | Discharge status: Home / Self Care | Payer: Medicaid Other | Source: Ambulatory Visit | Attending: Family Medicine | Admitting: Family Medicine

## 2013-12-14 ENCOUNTER — Encounter: Payer: Self-pay | Admitting: Family Medicine

## 2013-12-14 VITALS — BP 125/86 | HR 109 | Ht 63.0 in | Wt 124.0 lb

## 2013-12-14 DIAGNOSIS — Z113 Encounter for screening for infections with a predominantly sexual mode of transmission: Secondary | ICD-10-CM | POA: Diagnosis not present

## 2013-12-14 DIAGNOSIS — R102 Pelvic and perineal pain: Secondary | ICD-10-CM

## 2013-12-14 DIAGNOSIS — M545 Low back pain: Secondary | ICD-10-CM

## 2013-12-14 DIAGNOSIS — N898 Other specified noninflammatory disorders of vagina: Secondary | ICD-10-CM

## 2013-12-14 LAB — POCT URINALYSIS DIPSTICK
Bilirubin, UA: NEGATIVE
Glucose, UA: 500
Ketones, UA: NEGATIVE
LEUKOCYTES UA: NEGATIVE
Nitrite, UA: NEGATIVE
PROTEIN UA: NEGATIVE
SPEC GRAV UA: 1.01
Urobilinogen, UA: 0.2
pH, UA: 5.5

## 2013-12-14 LAB — POCT UA - MICROSCOPIC ONLY

## 2013-12-14 LAB — POCT WET PREP (WET MOUNT): Clue Cells Wet Prep Whiff POC: NEGATIVE

## 2013-12-14 MED ORDER — LIDOCAINE HCL 0.5 % EX GEL: 1.0000 "application " | CUTANEOUS | Status: DC | PRN

## 2013-12-14 MED ORDER — SULFAMETHOXAZOLE-TMP DS 800-160 MG PO TABS: 1.0000 | ORAL_TABLET | Freq: Two times a day (BID) | ORAL | Status: DC

## 2013-12-14 NOTE — Patient Instructions (Addendum)
[name] it was great to meet you today!  I am sorry that you are not feeling well.  I think it is possible that you have folliculitis. We will tx with antibiotics for 7 days You may also have HSV. I will call if your culture comes back positive  In the meanwhile, avoid soaps, fragrances, lacy underwear or shaving in this region Also refrain from friction ie intercourse and tight jeans if that makes symptoms worse  Please return to clinic if symptoms do not improve or worsen Feel better soon Sierra FerrettiMelanie C Naesha Buckalew, MD

## 2013-12-14 NOTE — Progress Notes (Signed)
Patient ID: Sierra JewettMelinda D Schneider, female   DOB: Jun 22, 1985, 28 y.o.   MRN: [id number]   [facility] Schneider Sierra FerrettiMelanie Schneider Sierra Pote, MD Phone: [phone]  Subjective:  [name] is a 28 y.o F [facility] of [facility]  # UTI? Pelvic pain? -pt is a known type I DM (has been wearing pump up higher)- sugars have not been higher than usual -shaves pelvic region and occasionally gets razor burn  -started on Friday- showered and shaved--> intercourse the next day (had felt some stubble prickling from shaving)  -some hesitancy, dysuria, some discharge vaginally as well -has mirena- occasionally will have spotting  -no fevers, n/v -but has had myalgias and general malaise  All relevant systems were reviewed and were negative unless otherwise noted in the HPI  Past Medical History Reviewed problem list.  Medications- reviewed and updated Current Outpatient Prescriptions  Medication Sig Dispense Refill  . busPIRone (BUSPAR) 15 MG tablet Take 1 tablet (15 mg total) by mouth 3 (three) times daily as needed.  90 tablet  1  . cyclobenzaprine (FLEXERIL) 10 MG tablet Take 1 tablet (10 mg total) by mouth 3 (three) times daily as needed for muscle spasms.  30 tablet  0  . fluticasone (FLONASE) 50 MCG/ACT nasal spray Place 1 spray into the nose 2 (two) times daily.  1 g  2  . ibuprofen (ADVIL,MOTRIN) 600 MG tablet Take 1 tablet (600 mg total) by mouth every 8 (eight) hours as needed.  30 tablet  0  . insulin aspart (NOVOLOG) 100 UNIT/ML injection Use 50 units per day in insulin pump  2 vial  3  . sertraline (ZOLOFT) 50 MG tablet TAKE 4 TABLETS BY MOUTH DAILY NEED TO SCHEDULE AN APPT PER MD       No current facility-administered medications for this visit.   Chief complaint-noted No additions to family history Social history- patient is a current smoker  Objective: BP 125/86  Pulse 109  Ht 5\' 3"  (1.6 m)  Wt 124 lb (56.246 kg)  BMI 21.97 kg/m2 Gen: NAD, alert, cooperative with  exam HEENT: NCAT, EOMI Neck: FROM, supple, no lymphadenopathy  GU: multiple sml vesicles in labia along with infected follicles on pelvic triangle; white discharge presents in the vault   Assessment/Plan: See problem based a/p

## 2013-12-14 NOTE — Assessment & Plan Note (Signed)
Likely related to recurrent folliculitis Reviewed proper GU hygiene Send for wet prep No signs of UTI Will also tx for 7 days with bactrim given DM hx Concern for HSV given appearance (has been tested a few years ago but will test again) RTC if no improvement

## 2013-12-15 ENCOUNTER — Telehealth: Payer: Self-pay | Admitting: Family Medicine

## 2013-12-15 LAB — CERVICOVAGINAL ANCILLARY ONLY
Chlamydia: NEGATIVE
Neisseria Gonorrhea: NEGATIVE

## 2013-12-15 NOTE — Telephone Encounter (Signed)
Pt called and wanted to know if her test results are back? Please call if they are. jw

## 2013-12-15 NOTE — Telephone Encounter (Signed)
Attempted to call pt back.  LMOVM (unidentifiable) informing pt "results are not back yet, we will give you a call when they are". [place], Sierra RochesterJessica Schneider

## 2013-12-16 ENCOUNTER — Telehealth: Payer: Self-pay | Admitting: Family Medicine

## 2013-12-16 DIAGNOSIS — R220 Localized swelling, mass and lump, head: Secondary | ICD-10-CM

## 2013-12-16 DIAGNOSIS — A6000 Herpesviral infection of urogenital system, unspecified: Secondary | ICD-10-CM

## 2013-12-16 LAB — HERPES SIMPLEX VIRUS CULTURE: Organism ID, Bacteria: DETECTED

## 2013-12-16 MED ORDER — IBUPROFEN 600 MG PO TABS: 600.0000 mg | ORAL_TABLET | Freq: Three times a day (TID) | ORAL | Status: DC | PRN

## 2013-12-16 MED ORDER — VALACYCLOVIR HCL 500 MG PO TABS: 500.0000 mg | ORAL_TABLET | Freq: Two times a day (BID) | ORAL | Status: DC

## 2013-12-16 NOTE — Telephone Encounter (Signed)
Would have patient come in to have blood tests done Treat for presumed herpes infection  High strength [facility]uprofen [facility], [facility]

## 2013-12-16 NOTE — Telephone Encounter (Signed)
Pt is calling back and said that it is urgent that she talk to a nurse about what is going on. jw

## 2013-12-16 NOTE — Telephone Encounter (Signed)
Pt was seen Monday in clinic; treated for UTI.  Antibiotic was given; pt stated the medication is not working.  Pt is requesting a change in antibiotic to something stronger and pain medication.  Pt is having pain in vaginal area, she cant's sit/walk and it burns very bad with urination.  Pt would like to provider to give her a call at [phone].

## 2013-12-16 NOTE — Telephone Encounter (Signed)
Informed patient of medication for presumed herpes and Ibuprofen sent in to pharmacy.  Pt advised she would need to come into clinic for lab work.  Pt stated understanding.  Sierra Schneider, Gizzelle Lacomb L, RN

## 2013-12-17 ENCOUNTER — Telehealth: Payer: Self-pay | Admitting: Family Medicine

## 2013-12-17 MED ORDER — HYDROCODONE-ACETAMINOPHEN 5-325 MG PO TABS: 1.0000 | ORAL_TABLET | Freq: Four times a day (QID) | ORAL | Status: DC | PRN

## 2013-12-17 NOTE — Telephone Encounter (Signed)
Attempted to call pt both at work and at home to inform of positive cultures  Will treat for recurrent infection (bc I suspect initial infection a year or so ago was also herpes outbreak) [facility] MD

## 2013-12-17 NOTE — Addendum Note (Signed)
Addended by: Anselm LisMARSH, Quinnten Calvin C on: 12/17/2013 11:13 AM   Modules accepted: Orders

## 2013-12-21 ENCOUNTER — Telehealth: Payer: Self-pay | Admitting: Family Medicine

## 2013-12-21 ENCOUNTER — Other Ambulatory Visit: Payer: Self-pay | Admitting: Family Medicine

## 2013-12-21 DIAGNOSIS — A6000 Herpesviral infection of urogenital system, unspecified: Secondary | ICD-10-CM

## 2013-12-21 MED ORDER — VALACYCLOVIR HCL 500 MG PO TABS: 500.0000 mg | ORAL_TABLET | Freq: Two times a day (BID) | ORAL | Status: DC

## 2013-12-21 NOTE — Telephone Encounter (Signed)
Pt calling again, is desperate to get this rx today so her infection does not get worse, would like to speak with RN before end of today.

## 2013-12-21 NOTE — Telephone Encounter (Signed)
Patient is calling again, states that it is urgent that she has the meds. Pls advise.

## 2013-12-21 NOTE — Telephone Encounter (Signed)
Pt called and would like to have more of her Valacyclovir called in. She said that the 6 just didn't clear it up. Please call her at work to let her know when and if we can do this. [phone] and ask for [facility]. Jw

## 2013-12-28 ENCOUNTER — Encounter: Payer: Self-pay | Admitting: *Deleted

## 2013-12-28 ENCOUNTER — Telehealth: Payer: Self-pay | Admitting: Endocrinology

## 2013-12-28 ENCOUNTER — Ambulatory Visit: Payer: Medicaid Other | Admitting: Endocrinology

## 2013-12-28 NOTE — Telephone Encounter (Signed)
Needs to be seen within the next 2-3 weeks.

## 2013-12-28 NOTE — Telephone Encounter (Signed)
Patient no showed today's appt. Please advise on how to follow up. °A. No follow up necessary. °B. Follow up urgent. Contact patient immediately. °C. Follow up necessary. Contact patient and schedule visit in ___ days. °D. Follow up advised. Contact patient and schedule visit in ____weeks. ° °

## 2014-01-14 ENCOUNTER — Ambulatory Visit (INDEPENDENT_AMBULATORY_CARE_PROVIDER_SITE_OTHER): Payer: Self-pay | Admitting: Family Medicine

## 2014-01-14 ENCOUNTER — Encounter: Payer: Self-pay | Admitting: Family Medicine

## 2014-01-14 VITALS — BP 113/74 | HR 74 | Temp 98.0°F | Ht 63.0 in | Wt 128.2 lb

## 2014-01-14 DIAGNOSIS — J019 Acute sinusitis, unspecified: Secondary | ICD-10-CM

## 2014-01-14 MED ORDER — AMOXICILLIN-POT CLAVULANATE 875-125 MG PO TABS: 1.0000 | ORAL_TABLET | Freq: Two times a day (BID) | ORAL | Status: DC

## 2014-01-14 NOTE — Patient Instructions (Signed)

## 2014-01-18 ENCOUNTER — Telehealth: Payer: Self-pay | Admitting: Endocrinology

## 2014-01-18 ENCOUNTER — Other Ambulatory Visit: Payer: Self-pay | Admitting: *Deleted

## 2014-01-18 MED ORDER — INSULIN ASPART 100 UNIT/ML ~~LOC~~ SOLN: SUBCUTANEOUS | Status: DC

## 2014-01-18 NOTE — Telephone Encounter (Signed)
Patient need refill of Novalog °

## 2014-01-18 NOTE — Progress Notes (Signed)
Patient ID: Sierra Schneider, female   DOB: 15-Dec-1985, 28 y.o.   MRN: [id number]  HPI:  Pt presents for a same day appointment to discuss URI.  Has been sick for 9+ days. Nasal discharge: yes, green Medications tried: nyquil Sick contacts: yes  Symptoms Fever: no Headache or face pain: yes Tooth pain: no Sneezing: yes Scratchy throat: yes Allergies: no Muscle aches: yes Severe fatigue: yes Stiff neck: no Shortness of breath: no Rash: no Sore throat or swollen glands: yes  Feeling lots of pressure in face. Hx T1DM. Coughing at night, no wheezing. Cough productive of green sputum. Quit smoking several weeks ago. Not getting any better at all, thinks getting worse.  ROS: See HPI  PMFSH: hx T1DM, depression  PHYSICAL EXAM: BP 113/74 mmHg  Pulse 74  Temp(Src) 98 F (36.7 C) (Oral)  Ht 5\' 3"  (1.6 m)  Wt 128 lb 3.2 oz (58.151 kg)  BMI 22.72 kg/m2  LMP 01/12/2014 Gen: NAD, pleasant, cooperative HEENT: NCAT, sinuses TTP diffusely on face. Oropharynx clear and moist without exudate. TM's clear bilaterally Heart: RRR Lungs: CTAB, NWOB, no wheezes, rales, rhonchi Neuro: grossly nonfocal, speech normal  ASSESSMENT/PLAN:  1. Acute sinusitis - suspect sinusitis with 9+ days of symptoms, facial pressure, green nasal discharge. Will rx augmentin BID x 10 days. Advised pt she can call if she develops a yeast infection and if that is the case we will call in diflucan for her. F/u if not improving or worsening.  FOLLOW UP: F/u as needed if symptoms worsen or do not improve.   GrenadaBrittany J. Pollie MeyerMcIntyre, MD [facility]

## 2014-01-18 NOTE — Telephone Encounter (Signed)
Patient need refill of Novalog generic brand. [facility] # [phone]  [facility] [address]

## 2014-01-18 NOTE — Telephone Encounter (Signed)
Please schedule her follow-up appointment first

## 2014-01-20 ENCOUNTER — Telehealth: Payer: Self-pay | Admitting: Endocrinology

## 2014-01-20 NOTE — Telephone Encounter (Signed)
Patient stated that her meds Novalog is to expensive, even with the discount, is there there any other way you can help this patient get her meds,  Please advise

## 2014-01-20 NOTE — Telephone Encounter (Signed)
Please see below and advise.

## 2014-01-20 NOTE — Telephone Encounter (Signed)
She can take [facility] brand of regular insulin OTC

## 2014-01-20 NOTE — Telephone Encounter (Signed)
What are the alternates that she can use that will be cheaper? Please advise.

## 2014-01-20 NOTE — Telephone Encounter (Signed)
Noted, patient is aware. 

## 2014-02-07 ENCOUNTER — Emergency Department (INDEPENDENT_AMBULATORY_CARE_PROVIDER_SITE_OTHER)
Admission: EM | Admit: 2014-02-07 | Discharge: 2014-02-07 | Disposition: A | Discharge status: Home / Self Care | Payer: Self-pay | Source: Home / Self Care | Attending: Family Medicine | Admitting: Family Medicine

## 2014-02-07 ENCOUNTER — Encounter (HOSPITAL_COMMUNITY): Payer: Self-pay | Admitting: Emergency Medicine

## 2014-02-07 DIAGNOSIS — J020 Streptococcal pharyngitis: Secondary | ICD-10-CM

## 2014-02-07 LAB — POCT RAPID STREP A: Streptococcus, Group A Screen (Direct): POSITIVE — AB

## 2014-02-07 MED ORDER — AMOXICILLIN 500 MG PO CAPS: 500.0000 mg | ORAL_CAPSULE | Freq: Three times a day (TID) | ORAL | Status: DC

## 2014-02-07 NOTE — Discharge Instructions (Signed)
Drink lots of fluids, take all of medicine, use lozenges as needed.return if needed °

## 2014-02-07 NOTE — ED Provider Notes (Signed)
CSN: [id number]     Arrival date & time 02/07/14  1747 History   First MD Initiated Contact with Patient 02/07/14 1759     Chief Complaint  Patient presents with  . Sore Throat   (Consider location/radiation/quality/duration/timing/severity/associated sxs/prior Treatment) Patient is a 28 y.o. female presenting with pharyngitis. The history is provided by the patient.  Sore Throat This is a new problem. The current episode started yesterday. The problem has been gradually worsening. Pertinent negatives include no chest pain, no abdominal pain, no headaches and no shortness of breath. The symptoms are aggravated by swallowing.    Past Medical History  Diagnosis Date  . Diabetes mellitus 08/2009    late onset type 1   Past Surgical History  Procedure Laterality Date  . Cesarean section    . No past surgeries    . Esophagogastroduodenoscopy Left 10/14/2012    Procedure: ESOPHAGOGASTRODUODENOSCOPY (EGD);  Surgeon: Vertell NovakJames L Edwards Jr., MD;  [facility];  Service: Endoscopy;  Laterality: Left;   Family History  Problem Relation Age of Onset  . Depression Mother   . Depression Father   . Kidney disease Paternal Grandmother    History  Substance Use Topics  . Smoking status: Current Every Day Smoker -- 1.00 packs/day    Types: Cigarettes  . Smokeless tobacco: Never Used  . Alcohol Use: Yes     Comment: 1 drink every 3 months   OB History    No data available     Review of Systems  Constitutional: Positive for fever and appetite change.  HENT: Positive for sore throat.   Respiratory: Negative.  Negative for shortness of breath.   Cardiovascular: Negative.  Negative for chest pain.  Gastrointestinal: Negative.  Negative for abdominal pain.  Neurological: Negative for headaches.  Hematological: Positive for adenopathy.    Allergies  Review of patient's allergies indicates no known allergies.  Home Medications   Prior to Admission medications   Medication Sig  Start Date End Date Taking? Authorizing Provider  amoxicillin (AMOXIL) 500 MG capsule Take 1 capsule (500 mg total) by mouth 3 (three) times daily. 02/07/14   Linna HoffJames D Oran Dillenburg, MD  amoxicillin-clavulanate (AUGMENTIN) 875-125 MG per tablet Take 1 tablet by mouth 2 (two) times daily. 01/14/14   Latrelle DodrillBrittany J McIntyre, MD  busPIRone (BUSPAR) 15 MG tablet Take 1 tablet (15 mg total) by mouth 3 (three) times daily as needed. 06/29/13   Garnetta BuddyEdward Williamson V, MD  cyclobenzaprine (FLEXERIL) 10 MG tablet Take 1 tablet (10 mg total) by mouth 3 (three) times daily as needed for muscle spasms. 10/04/13   Myra RudeJeremy E Schmitz, MD  fluticasone (FLONASE) 50 MCG/ACT nasal spray Place 1 spray into the nose 2 (two) times daily. 11/05/12   Linna HoffJames D Brees Hounshell, MD  HYDROcodone-acetaminophen (NORCO) 5-325 MG per tablet Take 1 tablet by mouth every 6 (six) hours as needed for moderate pain. 12/17/13   Charlane FerrettiMelanie C Marsh, MD  ibuprofen (ADVIL,MOTRIN) 600 MG tablet Take 1 tablet (600 mg total) by mouth every 8 (eight) hours as needed. 12/16/13   Charlane FerrettiMelanie C Marsh, MD  insulin aspart (NOVOLOG) 100 UNIT/ML injection Use 50 units per day in insulin pump 01/18/14   Reather LittlerAjay Kumar, MD  Lidocaine HCl 0.5 % GEL Apply 1 application topically as needed. 12/14/13   Charlane FerrettiMelanie C Marsh, MD  sertraline (ZOLOFT) 50 MG tablet TAKE 4 TABLETS BY MOUTH DAILY NEED TO SCHEDULE AN APPT PER MD    Garnetta BuddyEdward Williamson V, MD  sulfamethoxazole-trimethoprim (BACTRIM DS) 800-160  MG per tablet Take 1 tablet by mouth 2 (two) times daily. 12/14/13   Charlane FerrettiMelanie C Marsh, MD  valACYclovir (VALTREX) 500 MG tablet Take 1 tablet (500 mg total) by mouth 2 (two) times daily. 12/21/13   Myra RudeJeremy E Schmitz, MD   BP 105/57 mmHg  Pulse 89  Temp(Src) 98.1 F (36.7 C) (Oral)  Resp 18  SpO2 97%  LMP 01/12/2014 Physical Exam  Constitutional: She is oriented to person, place, and time. She appears well-developed and well-nourished.  HENT:  Head: Normocephalic.  Right Ear: External ear normal.  Left  Ear: External ear normal.  Mouth/Throat: Oropharynx is clear and moist.  Eyes: Conjunctivae are normal. Pupils are equal, round, and reactive to light.  Neck: Normal range of motion. Neck supple.  Lymphadenopathy:    She has cervical adenopathy.  Neurological: She is alert and oriented to person, place, and time.  Skin: Skin is warm and dry.  Nursing note and vitals reviewed.   ED Course  Procedures (including critical care time) Labs Review Labs Reviewed  POCT RAPID STREP A ([facility] URG CARE ONLY) - Abnormal; Notable for the following:    Streptococcus, Group A Screen (Direct) POSITIVE (*)    All other components within normal limits   Strep pos Imaging Review No results found.   MDM   1. Streptococcal sore throat        Linna HoffJames D Alvie Fowles, MD 02/07/14 [phone]

## 2014-02-07 NOTE — ED Notes (Signed)
Pt states that she has had a sore throat since 02/06/2014 along with some pain when swallowing.

## 2014-03-12 ENCOUNTER — Encounter (HOSPITAL_COMMUNITY): Payer: Self-pay | Admitting: *Deleted

## 2014-03-12 ENCOUNTER — Emergency Department (HOSPITAL_COMMUNITY): Payer: BLUE CROSS/BLUE SHIELD

## 2014-03-12 ENCOUNTER — Observation Stay (HOSPITAL_COMMUNITY)
Admission: EM | Admit: 2014-03-12 | Discharge: 2014-03-13 | Disposition: A | Discharge status: Home / Self Care | Payer: BLUE CROSS/BLUE SHIELD | Attending: Family Medicine | Admitting: Family Medicine

## 2014-03-12 DIAGNOSIS — R531 Weakness: Principal | ICD-10-CM | POA: Insufficient documentation

## 2014-03-12 DIAGNOSIS — E162 Hypoglycemia, unspecified: Secondary | ICD-10-CM | POA: Diagnosis present

## 2014-03-12 DIAGNOSIS — F419 Anxiety disorder, unspecified: Secondary | ICD-10-CM | POA: Diagnosis not present

## 2014-03-12 DIAGNOSIS — Z87891 Personal history of nicotine dependence: Secondary | ICD-10-CM | POA: Diagnosis not present

## 2014-03-12 DIAGNOSIS — E10649 Type 1 diabetes mellitus with hypoglycemia without coma: Secondary | ICD-10-CM | POA: Insufficient documentation

## 2014-03-12 DIAGNOSIS — F329 Major depressive disorder, single episode, unspecified: Secondary | ICD-10-CM | POA: Insufficient documentation

## 2014-03-12 DIAGNOSIS — Z79899 Other long term (current) drug therapy: Secondary | ICD-10-CM | POA: Insufficient documentation

## 2014-03-12 DIAGNOSIS — Z794 Long term (current) use of insulin: Secondary | ICD-10-CM | POA: Insufficient documentation

## 2014-03-12 DIAGNOSIS — R29898 Other symptoms and signs involving the musculoskeletal system: Secondary | ICD-10-CM | POA: Diagnosis present

## 2014-03-12 DIAGNOSIS — I639 Cerebral infarction, unspecified: Secondary | ICD-10-CM

## 2014-03-12 LAB — URINALYSIS, ROUTINE W REFLEX MICROSCOPIC
Bilirubin Urine: NEGATIVE
Glucose, UA: 250 mg/dL — AB
HGB URINE DIPSTICK: NEGATIVE
Ketones, ur: NEGATIVE mg/dL
Leukocytes, UA: NEGATIVE
NITRITE: NEGATIVE
Protein, ur: NEGATIVE mg/dL
SPECIFIC GRAVITY, URINE: 1.008 (ref 1.005–1.030)
UROBILINOGEN UA: 0.2 mg/dL (ref 0.0–1.0)
pH: 6.5 (ref 5.0–8.0)

## 2014-03-12 LAB — I-STAT TROPONIN, ED: Troponin i, poc: 0.01 ng/mL (ref 0.00–0.08)

## 2014-03-12 LAB — CBG MONITORING, ED
GLUCOSE-CAPILLARY: 68 mg/dL — AB (ref 70–99)
GLUCOSE-CAPILLARY: 128 mg/dL — AB (ref 70–99)
Glucose-Capillary: 80 mg/dL (ref 70–99)
Glucose-Capillary: 89 mg/dL (ref 70–99)

## 2014-03-12 LAB — COMPREHENSIVE METABOLIC PANEL
ALBUMIN: 4.1 g/dL (ref 3.5–5.2)
ALT: 49 U/L — ABNORMAL HIGH (ref 0–35)
ANION GAP: 7 (ref 5–15)
AST: 53 U/L — ABNORMAL HIGH (ref 0–37)
Alkaline Phosphatase: 85 U/L (ref 39–117)
BUN: 6 mg/dL (ref 6–23)
CALCIUM: 8.6 mg/dL (ref 8.4–10.5)
CO2: 29 mmol/L (ref 19–32)
CREATININE: 0.57 mg/dL (ref 0.50–1.10)
Chloride: 102 mEq/L (ref 96–112)
GFR calc Af Amer: >90 mL/min (ref >90)
GLUCOSE: 82 mg/dL (ref 70–99)
Potassium: 3.8 mmol/L (ref 3.5–5.1)
SODIUM: 138 mmol/L (ref 135–145)
Total Bilirubin: 0.8 mg/dL (ref 0.3–1.2)
Total Protein: 7.1 g/dL (ref 6.0–8.3)

## 2014-03-12 LAB — CBC
HCT: 39.4 % (ref 36.0–46.0)
Hemoglobin: 13.1 g/dL (ref 12.0–15.0)
MCH: 29.8 pg (ref 26.0–34.0)
MCHC: 33.2 g/dL (ref 30.0–36.0)
MCV: 89.5 fL (ref 78.0–100.0)
PLATELETS: 331 10*3/uL (ref 150–400)
RBC: 4.4 MIL/uL (ref 3.87–5.11)
RDW: 12.9 % (ref 11.5–15.5)
WBC: 10.9 10*3/uL — ABNORMAL HIGH (ref 4.0–10.5)

## 2014-03-12 LAB — RAPID URINE DRUG SCREEN, HOSP PERFORMED
Amphetamines: NOT DETECTED
BENZODIAZEPINES: POSITIVE — AB
Barbiturates: NOT DETECTED
COCAINE: NOT DETECTED
Opiates: NOT DETECTED
Tetrahydrocannabinol: NOT DETECTED

## 2014-03-12 LAB — I-STAT CHEM 8, ED
BUN: 6 mg/dL (ref 6–23)
CREATININE: 0.7 mg/dL (ref 0.50–1.10)
Calcium, Ion: 1.06 mmol/L — ABNORMAL LOW (ref 1.12–1.23)
Chloride: 100 mEq/L (ref 96–112)
Glucose, Bld: 81 mg/dL (ref 70–99)
HEMATOCRIT: 43 % (ref 36.0–46.0)
HEMOGLOBIN: 14.6 g/dL (ref 12.0–15.0)
POTASSIUM: 4.8 mmol/L (ref 3.5–5.1)
Sodium: 138 mmol/L (ref 135–145)
TCO2: 28 mmol/L (ref 0–100)

## 2014-03-12 LAB — DIFFERENTIAL
BASOS ABS: 0 10*3/uL (ref 0.0–0.1)
Basophils Relative: 0 % (ref 0–1)
EOS ABS: 0.4 10*3/uL (ref 0.0–0.7)
Eosinophils Relative: 4 % (ref 0–5)
Lymphocytes Relative: 30 % (ref 12–46)
Lymphs Abs: 3.2 10*3/uL (ref 0.7–4.0)
MONO ABS: 0.8 10*3/uL (ref 0.1–1.0)
MONOS PCT: 8 % (ref 3–12)
Neutro Abs: 6.3 10*3/uL (ref 1.7–7.7)
Neutrophils Relative %: 58 % (ref 43–77)

## 2014-03-12 LAB — APTT: aPTT: 28 seconds (ref 24–37)

## 2014-03-12 LAB — PROTIME-INR
INR: 0.86 (ref 0.00–1.49)
Prothrombin Time: 11.8 seconds (ref 11.6–15.2)

## 2014-03-12 LAB — ETHANOL: Alcohol, Ethyl (B): <5 mg/dL (ref 0–9)

## 2014-03-12 MED ORDER — METOCLOPRAMIDE HCL 5 MG/ML IJ SOLN
10.0000 mg | Freq: Once | INTRAMUSCULAR | Status: AC
  Administered 2014-03-12: 10 mg via INTRAVENOUS
  Filled 2014-03-12: qty 2

## 2014-03-12 MED ORDER — DEXTROSE-NACL 5-0.45 % IV SOLN
INTRAVENOUS | Status: DC
  Administered 2014-03-12: 23:00:00 via INTRAVENOUS

## 2014-03-12 MED ORDER — DEXTROSE 50 % IV SOLN
INTRAVENOUS | Status: AC
Start: 2014-03-12 — End: 2014-03-12
  Administered 2014-03-12: 50 mL via INTRAVENOUS
  Filled 2014-03-12: qty 50

## 2014-03-12 MED ORDER — IOHEXOL 350 MG/ML SOLN
50.0000 mL | Freq: Once | INTRAVENOUS | Status: AC | PRN
  Administered 2014-03-12: 50 mL via INTRAVENOUS

## 2014-03-12 MED ORDER — DEXTROSE 50 % IV SOLN
50.0000 mL | Freq: Once | INTRAVENOUS | Status: AC
  Administered 2014-03-12: 50 mL via INTRAVENOUS

## 2014-03-12 MED ORDER — DIPHENHYDRAMINE HCL 50 MG/ML IJ SOLN
25.0000 mg | Freq: Once | INTRAMUSCULAR | Status: AC
  Administered 2014-03-12: 25 mg via INTRAVENOUS
  Filled 2014-03-12: qty 1

## 2014-03-12 NOTE — ED Notes (Signed)
Pt transported to MRI 

## 2014-03-12 NOTE — ED Notes (Signed)
Pt states that around 8pm she began having left sided weakness that has gotten progressively worse; pt states that her left side feels heavy and she can barely move; pt states that she feels tired but denies headache or pain

## 2014-03-12 NOTE — ED Notes (Signed)
[facility] at [facility]. Pt taken immediately to CT. Neurologist at bedside.

## 2014-03-12 NOTE — ED Provider Notes (Addendum)
CSN: [id number]     Arrival date & time 03/12/14  2057 History   First MD Initiated Contact with Patient 03/12/14 2111     Chief Complaint  Patient presents with  . Code Stroke     (Consider location/radiation/quality/duration/timing/severity/associated sxs/prior Treatment) HPI  A LEVEL 5 CAVEAT PERTAINS DUE TO URGENT NEED FOR INTERVENTION Pt presenting with c/o left sided arm and leg weakness, onset was acute one hour prior to arrival.  She had an episode of similar sympotms last night that resolved after 10 minutes.  Today she could not lift left arm at all.  Checked her blood sugar at home and it was 80 while symptoms were ongoing.  No changes in vision or speech.  Pt denies headache.    Past Medical History  Diagnosis Date  . Diabetes mellitus 08/2009    late onset type 1   Past Surgical History  Procedure Laterality Date  . Cesarean section    . No past surgeries    . Esophagogastroduodenoscopy Left 10/14/2012    Procedure: ESOPHAGOGASTRODUODENOSCOPY (EGD);  Surgeon: Vertell Novak., MD;  Location: [facility] ENDOSCOPY;  Service: Endoscopy;  Laterality: Left;   Family History  Problem Relation Age of Onset  . Depression Mother   . Depression Father   . Kidney disease Paternal Grandmother    History  Substance Use Topics  . Smoking status: Former Smoker -- 0.00 packs/day    Quit date: 01/26/2014  . Smokeless tobacco: Never Used  . Alcohol Use: Yes     Comment: 1 drink every 3 months   OB History    No data available     Review of Systems  UNABLE TO OBTAIN ROS DUE TO LEVEL 5 CAVEAT    Allergies  Review of patient's allergies indicates no known allergies.  Home Medications   Prior to Admission medications   Medication Sig Start Date End Date Taking? Authorizing Provider  fluticasone (FLONASE) 50 MCG/ACT nasal spray Place 1 spray into the nose 2 (two) times daily. 11/05/12  Yes Linna Hoff, MD  ibuprofen (ADVIL,MOTRIN) 600 MG tablet Take 1 tablet (600 mg total)  by mouth every 8 (eight) hours as needed. 12/16/13  Yes Charlane Ferretti, MD  insulin aspart (NOVOLOG) 100 UNIT/ML injection Use 50 units per day in insulin pump 01/18/14  Yes Reather Littler, MD  Lidocaine HCl 0.5 % GEL Apply 1 application topically as needed. 12/14/13  Yes Charlane Ferretti, MD  sertraline (ZOLOFT) 50 MG tablet TAKE 4 TABLETS BY MOUTH DAILY NEED TO SCHEDULE AN APPT PER MD   Yes Garnetta Buddy, MD  busPIRone (BUSPAR) 15 MG tablet Take 1 tablet (15 mg total) by mouth 3 (three) times daily as needed. 06/29/13   Garnetta Buddy, MD   BP 97/58 mmHg  Pulse 94  Temp(Src) 98 F (36.7 C) (Oral)  Resp 16  Ht 5\' 4"  (1.626 m)  Wt 143 lb 8 oz (65.091 kg)  BMI 24.62 kg/m2  SpO2 99%  Vitals reviewed Physical Exam  Physical Examination: General appearance - alert, well appearing, and in no distress Mental status - alert, oriented to person, place, and time Eyes - no conjunctival injection,  PERRL, EOMI Mouth - mucous membranes moist, pharynx normal without lesions Neck - supple, no significant adenopathy Chest - clear to auscultation, no wheezes, rales or rhonchi, symmetric air entry Heart - normal rate, regular rhythm, normal S1, S2, no murmurs, rubs, clicks or gallops Abdomen - soft, nontender, nondistended, no masses or  organomegaly Neurological - alert, oriented x 3, normal speech, cranial nerves 2-12 tested and intact, left upper extremity able to hold against gravity for < 1 second, grip strength very weak, able to extend left lower extremity at knee, but not able to resist against pressure Extremities - peripheral pulses normal, no pedal edema, no clubbing or cyanosis Skin - normal coloration and turgor, no rashes  ED Course  Procedures (including critical care time)  9:16 PM pt seen and evaluated in triage- left arm and left leg weakness, onset one hour ago.  Code Stroke activated.  D/w Dr. Amada Jupiter, pt to be transferred over to [facility] immediately- [facility] states they have  a truck available.  Pt will go to [facility] and have head CT there.    9:34 PM  D/w Dr. Anitra Lauth, [facility] ED, pt is being transferred there now via [facility].    CRITICAL CARE Performed by: Ethelda Chick Total critical care time: 35 Critical care time was exclusive of separately billable procedures and treating other patients. Critical care was necessary to treat or prevent imminent or life-threatening deterioration. Critical care was time spent personally by me on the following activities: development of treatment plan with patient and/or surrogate as well as nursing, discussions with consultants, evaluation of patient's response to treatment, examination of patient, obtaining history from patient or surrogate, ordering and performing treatments and interventions, ordering and review of laboratory studies, ordering and review of radiographic studies, pulse oximetry and re-evaluation of patient's condition. Labs Review Labs Reviewed  CBC - Abnormal; Notable for the following:    WBC 10.9 (*)    All other components within normal limits  COMPREHENSIVE METABOLIC PANEL - Abnormal; Notable for the following:    AST 53 (*)    ALT 49 (*)    All other components within normal limits  URINE RAPID DRUG SCREEN (HOSP PERFORMED) - Abnormal; Notable for the following:    Benzodiazepines POSITIVE (*)    All other components within normal limits  URINALYSIS, ROUTINE W REFLEX MICROSCOPIC - Abnormal; Notable for the following:    Glucose, UA 250 (*)    All other components within normal limits  COMPREHENSIVE METABOLIC PANEL - Abnormal; Notable for the following:    Sodium 134 (*)    Glucose, Bld 367 (*)    BUN <5 (*)    Calcium 8.3 (*)    Total Protein 5.7 (*)    Albumin 3.3 (*)    ALT 40 (*)    Anion gap 4 (*)    All other components within normal limits  HEMOGLOBIN A1C - Abnormal; Notable for the following:    Hgb A1c MFr Bld 8.2 (*)    Mean Plasma Glucose 189 (*)    All other components within normal  limits  GLUCOSE, CAPILLARY - Abnormal; Notable for the following:    Glucose-Capillary 357 (*)    All other components within normal limits  LIPID PANEL - Abnormal; Notable for the following:    LDL Cholesterol 112 (*)    All other components within normal limits  I-STAT CHEM 8, ED - Abnormal; Notable for the following:    Calcium, Ion 1.06 (*)    All other components within normal limits  CBG MONITORING, ED - Abnormal; Notable for the following:    Glucose-Capillary 68 (*)    All other components within normal limits  CBG MONITORING, ED - Abnormal; Notable for the following:    Glucose-Capillary 128 (*)    All other components within normal limits  CBG MONITORING, ED - Abnormal; Notable for the following:    Glucose-Capillary 58 (*)    All other components within normal limits  CBG MONITORING, ED - Abnormal; Notable for the following:    Glucose-Capillary 195 (*)    All other components within normal limits  CBG MONITORING, ED - Abnormal; Notable for the following:    Glucose-Capillary 176 (*)    All other components within normal limits  CBG MONITORING, ED - Abnormal; Notable for the following:    Glucose-Capillary 194 (*)    All other components within normal limits  CBG MONITORING, ED - Abnormal; Notable for the following:    Glucose-Capillary 208 (*)    All other components within normal limits  ETHANOL  PROTIME-INR  APTT  DIFFERENTIAL  PREGNANCY, URINE  CBC  GLUCOSE, CAPILLARY  I-STAT TROPOININ, ED  I-STAT TROPOININ, ED  CBG MONITORING, ED  CBG MONITORING, ED    Imaging Review Ct Angio Head W/cm &/or Wo Cm  03/13/2014   CLINICAL DATA:  Initial valuation for acute left-sided weakness. Headache.  EXAM: CT ANGIOGRAPHY HEAD AND NECK  TECHNIQUE: Multidetector CT imaging of the head and neck was performed using the standard protocol during bolus administration of intravenous contrast. Multiplanar CT image reconstructions and MIPs were obtained to evaluate the vascular  anatomy. Carotid stenosis measurements (when applicable) are obtained utilizing NASCET criteria, using the distal internal carotid diameter as the denominator.  CONTRAST:  50mL OMNIPAQUE IOHEXOL 350 MG/ML SOLN  COMPARISON:  Prior MRI performed earlier on the same day.  FINDINGS: CTA NECK  Aortic arch: Visualize aortic arch is of normal caliber and appearance with normal 3 vessel morphology. No high-grade stenosis seen at the origin of the great vessels. Subclavian arteries well opacified bilaterally  Right carotid system: Right common and internal carotid arteries well opacified to the level of the skullbase without hemodynamically significant stenosis or significant atheromatous disease.  Left carotid system: Left common and internal carotid artery is well opacified to the level of the skullbase without hemodynamically significant stenosis or significant atheromatous disease.  Vertebral arteries: Both vertebral arteries arise from the subclavian arteries. The vertebral arteries are well opacified to the level of the skullbase without significant stenosis, dissection, or occlusion.  Visualized lungs are clear. No soft tissue abnormality within the neck. Thyroid gland normal. No adenopathy.  CTA HEAD  Anterior circulation: The petrous, cavernous, and supra clinoid segments of the internal carotid arteries are widely patent bilaterally. A1 segments, anterior communicating artery, and anterior cerebral arteries well opacified.  M1 segments widely patent without significant stenosis or occlusion. MCA bifurcations normal. Distal MCA branches within normal limits.  Posterior circulation: Vertebral arteries are codominant and widely patent. Vertebrobasilar junction and basilar artery are normal. Posterior inferior cerebellar artery is well opacified. Superior cerebellar arteries and posterior cerebral arteries well opacified bilaterally. Small posterior communicating arteries seen bilaterally.  Venous sinuses: No  abnormality identified within the venous sinuses.  Anatomic variants: No aneurysm or vascular malformation.  Delayed phase: No abnormal enhancement on delayed sequence.  IMPRESSION: Normal CTA of the head and neck.   Electronically Signed   By: Rise MuBenjamin  McClintock M.D.   On: 03/13/2014 00:51   Ct Head Wo Contrast  03/12/2014   CLINICAL DATA:  Code stroke.  Left-sided weakness.  EXAM: CT HEAD WITHOUT CONTRAST  TECHNIQUE: Contiguous axial images were obtained from the base of the skull through the vertex without intravenous contrast.  COMPARISON:  None.  FINDINGS: No intracranial hemorrhage, mass effect, or  midline shift. No hydrocephalus. The basilar cisterns are patent. No evidence of territorial infarct. No intracranial fluid collection. Calvarium is intact. Included paranasal sinuses and mastoid air cells are well aerated.  IMPRESSION: No acute intracranial abnormality.  These results were called by telephone at the time of interpretation on 03/12/2014 at 9:48 pm to Dr. Ritta Slot , who verbally acknowledged these results.   Electronically Signed   By: Rubye Oaks M.D.   On: 03/12/2014 21:49   Ct Angio Neck W/cm &/or Wo/cm  03/13/2014   CLINICAL DATA:  Initial valuation for acute left-sided weakness. Headache.  EXAM: CT ANGIOGRAPHY HEAD AND NECK  TECHNIQUE: Multidetector CT imaging of the head and neck was performed using the standard protocol during bolus administration of intravenous contrast. Multiplanar CT image reconstructions and MIPs were obtained to evaluate the vascular anatomy. Carotid stenosis measurements (when applicable) are obtained utilizing NASCET criteria, using the distal internal carotid diameter as the denominator.  CONTRAST:  50mL OMNIPAQUE IOHEXOL 350 MG/ML SOLN  COMPARISON:  Prior MRI performed earlier on the same day.  FINDINGS: CTA NECK  Aortic arch: Visualize aortic arch is of normal caliber and appearance with normal 3 vessel morphology. No high-grade stenosis seen  at the origin of the great vessels. Subclavian arteries well opacified bilaterally  Right carotid system: Right common and internal carotid arteries well opacified to the level of the skullbase without hemodynamically significant stenosis or significant atheromatous disease.  Left carotid system: Left common and internal carotid artery is well opacified to the level of the skullbase without hemodynamically significant stenosis or significant atheromatous disease.  Vertebral arteries: Both vertebral arteries arise from the subclavian arteries. The vertebral arteries are well opacified to the level of the skullbase without significant stenosis, dissection, or occlusion.  Visualized lungs are clear. No soft tissue abnormality within the neck. Thyroid gland normal. No adenopathy.  CTA HEAD  Anterior circulation: The petrous, cavernous, and supra clinoid segments of the internal carotid arteries are widely patent bilaterally. A1 segments, anterior communicating artery, and anterior cerebral arteries well opacified.  M1 segments widely patent without significant stenosis or occlusion. MCA bifurcations normal. Distal MCA branches within normal limits.  Posterior circulation: Vertebral arteries are codominant and widely patent. Vertebrobasilar junction and basilar artery are normal. Posterior inferior cerebellar artery is well opacified. Superior cerebellar arteries and posterior cerebral arteries well opacified bilaterally. Small posterior communicating arteries seen bilaterally.  Venous sinuses: No abnormality identified within the venous sinuses.  Anatomic variants: No aneurysm or vascular malformation.  Delayed phase: No abnormal enhancement on delayed sequence.  IMPRESSION: Normal CTA of the head and neck.   Electronically Signed   By: Rise Mu M.D.   On: 03/13/2014 00:51   Mr Brain Wo Contrast  03/12/2014   CLINICAL DATA:  Initial evaluation for acute onset left-sided weakness, now resolved. Evaluate  for possible stroke.  EXAM: MRI HEAD WITHOUT CONTRAST  TECHNIQUE: Multiplanar, multiecho pulse sequences of the brain and surrounding structures were obtained without intravenous contrast.  COMPARISON:  Prior head CT from earlier the same day.  FINDINGS: The CSF containing spaces are within normal limits for patient age. Mild chronic small vessel ischemic changes present within the pons. No mass lesion, midline shift, or extra-axial fluid collection. Ventricles are normal in size without evidence of hydrocephalus.  No diffusion-weighted signal abnormality is identified to suggest acute intracranial infarct. Gray-white matter differentiation is maintained. Normal flow voids are seen within the intracranial vasculature. No intracranial hemorrhage identified.  The cervicomedullary junction  is normal. Pituitary gland is within normal limits. Pituitary stalk is midline. The globes and optic nerves demonstrate a normal appearance with normal signal intensity. The  The bone marrow signal intensity is normal. Calvarium is intact. Visualized upper cervical spine is within normal limits.  Scalp soft tissues are unremarkable.  Paranasal sinuses are clear.  No mastoid effusion.  IMPRESSION: 1. No acute intracranial infarct or other abnormality identified. 2. Mild chronic small vessel ischemic type changes within the central pons. Otherwise normal brain MRI.   Electronically Signed   By: Rise Mu M.D.   On: 03/12/2014 23:06     EKG Interpretation   Date/Time:  Friday March 12 2014 23:09:20 EST Ventricular Rate:  79 PR Interval:  121 QRS Duration: 81 QT Interval:  410 QTC Calculation: 470 R Axis:   66 Text Interpretation:  Sinus rhythm Borderline T abnormalities, anterior  leads No old tracing to compare Reconfirmed by [facility]  MD, Immanuel Fedak [phone])  on 03/14/2014 3:00:45 PM      MDM   Final diagnoses:  Weakness of left arm  Hypoglycemia    Pt presenting with acute onset of left upper and left  lower extremity weakness- onset acute approx 1 hour prior to arrival.  She has no headache, bs was 80 at home during the symptoms.  On arrival to the ED BS is mildly low at 68.  Code stroke activated, although possibly could be releated to hypoglycemia or other consideration would be atypical migraine- although patient denies having headache.  Pt transferred emergently to [facility] ED- for further workup.      Ethelda Chick, MD 03/13/14 1528  Ethelda Chick, MD 03/14/14 [id number]

## 2014-03-12 NOTE — Consult Note (Signed)
Neurology Consultation Reason for Consult: Left sided weakness Referring Physician: Anitra Lauth, W  CC: Left sided weakness  History is obtained from:patient   HPI: Sierra Schneider is a 29 y.o. female with a history of DMI who presents with left sided weakness that started at approximately 8pm. She presented to [facility] ED and a code stroke was activated and she was trasnferred to [facility]. A CT was negative and there were some aspects of her exam concerning for non-organic etiology vs. Embellishment. She was taken for an MRI brain and this did not reveal any stroke.   Of note, she does get migraines. She currently has a headache that is bifrontal and associated with photophobia. Her BG was 66 on arrival, and was 80 prior to leaving home.   LKW: 8 pm tpa given?: no, improving symptoms.     ROS: A 14 point ROS was performed and is negative except as noted in the HPI.   Past Medical History  Diagnosis Date  . Diabetes mellitus 08/2009    late onset type 1    Family History: No hx blood clots.   Social History: Tob: non-smoker  Exam: Current vital signs: BP 115/82 mmHg  Pulse 60  Temp(Src) 97.6 F (36.4 C) (Oral)  Resp 20  Ht  (1.6 m)  Wt 63.504 kg (140 lb)  BMI 24.81 kg/m2  SpO2 100% Vital signs in last 24 hours: Temp:  [97.6 F (36.4 C)] 97.6 F (36.4 C) (01/15 2103) Pulse Rate:  [60] 60 (01/15 2103) Resp:  [20] 20 (01/15 2103) BP: (115)/(82) 115/82 mmHg (01/15 2103) SpO2:  [100 %] 100 % (01/15 2103) Weight:  [63.504 kg (140 lb)] 63.504 kg (140 lb) (01/15 2103)   Physical Exam  Constitutional: Appears well-developed and well-nourished.  Psych: Affect appropriate to situation Eyes: No scleral injection HENT: No OP obstrucion Head: Normocephalic.  Cardiovascular: Normal rate and regular rhythm.  Respiratory: Effort normal and breath sounds normal to anterior ascultation GI: Soft.  No distension. There is no tenderness.  Skin: WDI  Neuro: Mental Status: Patient  is awake, alert, oriented to person, place, month, year, and situation. Patient is able to give a clear and coherent history. No signs of aphasia or neglect Cranial Nerves: II: Visual Fields are full. Pupils are equal, round, and reactive to light.   III,IV, VI: EOMI without ptosis or diploplia.  V: Facial sensation is symmetric to temperature VII: Facial movement is symmetric.  VIII: hearing is intact to voice X: Uvula elevates symmetrically XI: Shoulder shrug is symmetric. XII: tongue is midline without atrophy or fasciculations.  Motor: Tone is normal. Bulk is normal. 5/5 strength was present on the right side, she is barely able to lift her left arm, and then lets it crash down to the bed. When positioned and FNF is started, she is able to continue to perform finger-nose-finger without deficits. She is able to lift her leg better, but likewise lets it then crash to the bed.  Sensory: Sensation is symmetric to light touch and temperature in the arms and legs. Deep Tendon Reflexes: 2+ and symmetric in the biceps and patellae.  Plantars: Toes are downgoing bilaterally.  Cerebellar: FNF are intact bilaterally         I have reviewed labs in epic and the results pertinent to this consultation are: Chem 8 - unremarkable  I have reviewed the images obtained:CT head - negatvie  Impression: 29 yo F with left sided weakness followed by bifrontal photophobic headache. Given this  constellation of symptoms and negative MRI, I would greatly favor complicated migraine to TIA. Given her history of DM, however, it may be reasonable to assess her vasculature and if significant atherosclerosis or other disease were seen may need to address this.   Recommendations: 1) Treat migraine.  2) CTA head and neck 3) If CTA is unremarkable, no further recommendations.    Ritta SlotMcNeill Deirdre Gryder, MD [facility] [phone]  If 7pm- 7am, please page neurology on call as listed in  AMION.

## 2014-03-12 NOTE — ED Notes (Addendum)
Pt returned from MRI. EDP and neurologist at bedside. Pt sts her L arm is better now. No drift noted. Pt's insulin pump removed in MRI. Blood sugar rechecked upon return to ER.

## 2014-03-13 DIAGNOSIS — M6289 Other specified disorders of muscle: Secondary | ICD-10-CM

## 2014-03-13 DIAGNOSIS — R531 Weakness: Secondary | ICD-10-CM | POA: Insufficient documentation

## 2014-03-13 DIAGNOSIS — E162 Hypoglycemia, unspecified: Secondary | ICD-10-CM

## 2014-03-13 LAB — LIPID PANEL
CHOL/HDL RATIO: 3.5 ratio
Cholesterol: 189 mg/dL (ref 0–200)
HDL: 54 mg/dL (ref >39)
LDL Cholesterol: 112 mg/dL — ABNORMAL HIGH (ref 0–99)
TRIGLYCERIDES: 113 mg/dL (ref <150)
VLDL: 23 mg/dL (ref 0–40)

## 2014-03-13 LAB — CBG MONITORING, ED
GLUCOSE-CAPILLARY: 194 mg/dL — AB (ref 70–99)
Glucose-Capillary: 176 mg/dL — ABNORMAL HIGH (ref 70–99)
Glucose-Capillary: 195 mg/dL — ABNORMAL HIGH (ref 70–99)
Glucose-Capillary: 208 mg/dL — ABNORMAL HIGH (ref 70–99)
Glucose-Capillary: 58 mg/dL — ABNORMAL LOW (ref 70–99)

## 2014-03-13 LAB — COMPREHENSIVE METABOLIC PANEL
ALK PHOS: 76 U/L (ref 39–117)
ALT: 40 U/L — ABNORMAL HIGH (ref 0–35)
AST: 37 U/L (ref 0–37)
Albumin: 3.3 g/dL — ABNORMAL LOW (ref 3.5–5.2)
Anion gap: 4 — ABNORMAL LOW (ref 5–15)
CO2: 28 mmol/L (ref 19–32)
CREATININE: 0.6 mg/dL (ref 0.50–1.10)
Calcium: 8.3 mg/dL — ABNORMAL LOW (ref 8.4–10.5)
Chloride: 102 mEq/L (ref 96–112)
GFR calc non Af Amer: >90 mL/min (ref >90)
GLUCOSE: 367 mg/dL — AB (ref 70–99)
Potassium: 4.2 mmol/L (ref 3.5–5.1)
SODIUM: 134 mmol/L — AB (ref 135–145)
Total Bilirubin: 0.4 mg/dL (ref 0.3–1.2)
Total Protein: 5.7 g/dL — ABNORMAL LOW (ref 6.0–8.3)

## 2014-03-13 LAB — CBC
HCT: 39.5 % (ref 36.0–46.0)
HEMOGLOBIN: 13.1 g/dL (ref 12.0–15.0)
MCH: 29.4 pg (ref 26.0–34.0)
MCHC: 33.2 g/dL (ref 30.0–36.0)
MCV: 88.8 fL (ref 78.0–100.0)
PLATELETS: 291 10*3/uL (ref 150–400)
RBC: 4.45 MIL/uL (ref 3.87–5.11)
RDW: 12.9 % (ref 11.5–15.5)
WBC: 9.4 10*3/uL (ref 4.0–10.5)

## 2014-03-13 LAB — GLUCOSE, CAPILLARY
Glucose-Capillary: 357 mg/dL — ABNORMAL HIGH (ref 70–99)
Glucose-Capillary: 92 mg/dL (ref 70–99)

## 2014-03-13 LAB — HEMOGLOBIN A1C
Hgb A1c MFr Bld: 8.2 % — ABNORMAL HIGH (ref <5.7)
Mean Plasma Glucose: 189 mg/dL — ABNORMAL HIGH (ref <117)

## 2014-03-13 LAB — PREGNANCY, URINE: PREG TEST UR: NEGATIVE

## 2014-03-13 MED ORDER — DEXTROSE 50 % IV SOLN
1.0000 | Freq: Once | INTRAVENOUS | Status: DC
  Filled 2014-03-13: qty 50

## 2014-03-13 MED ORDER — SERTRALINE HCL 100 MG PO TABS: 200.0000 mg | ORAL_TABLET | Freq: Every day | ORAL | Status: DC

## 2014-03-13 MED ORDER — HEPARIN SODIUM (PORCINE) 5000 UNIT/ML IJ SOLN
5000.0000 [IU] | Freq: Three times a day (TID) | INTRAMUSCULAR | Status: DC
  Administered 2014-03-13: 5000 [IU] via SUBCUTANEOUS
  Filled 2014-03-13: qty 1

## 2014-03-13 MED ORDER — SODIUM CHLORIDE 0.9 % IV SOLN
INTRAVENOUS | Status: DC
  Administered 2014-03-13: 06:00:00 via INTRAVENOUS

## 2014-03-13 MED ORDER — INSULIN ASPART 100 UNIT/ML ~~LOC~~ SOLN
0.0000 [IU] | Freq: Three times a day (TID) | SUBCUTANEOUS | Status: DC
  Administered 2014-03-13: 15 [IU] via SUBCUTANEOUS

## 2014-03-13 MED ORDER — INFLUENZA VAC SPLIT QUAD 0.5 ML IM SUSY: 0.5000 mL | PREFILLED_SYRINGE | INTRAMUSCULAR | Status: DC

## 2014-03-13 MED ORDER — PNEUMOCOCCAL VAC POLYVALENT 25 MCG/0.5ML IJ INJ: 0.5000 mL | INJECTION | INTRAMUSCULAR | Status: DC

## 2014-03-13 MED ORDER — DEXTROSE-NACL 5-0.45 % IV SOLN
INTRAVENOUS | Status: DC
  Administered 2014-03-13: 04:00:00 via INTRAVENOUS

## 2014-03-13 MED ORDER — ACETAMINOPHEN 325 MG PO TABS
650.0000 mg | ORAL_TABLET | Freq: Four times a day (QID) | ORAL | Status: DC | PRN
  Administered 2014-03-13: 650 mg via ORAL
  Filled 2014-03-13: qty 2

## 2014-03-13 MED ORDER — DEXTROSE 50 % IV SOLN
1.0000 | Freq: Once | INTRAVENOUS | Status: AC
  Administered 2014-03-13: 50 mL via INTRAVENOUS

## 2014-03-13 MED ORDER — SERTRALINE HCL 100 MG PO TABS: 100.0000 mg | ORAL_TABLET | Freq: Every day | ORAL | Status: DC

## 2014-03-13 MED ORDER — INSULIN ASPART 100 UNIT/ML ~~LOC~~ SOLN: 0.0000 [IU] | Freq: Every day | SUBCUTANEOUS | Status: DC

## 2014-03-13 NOTE — Progress Notes (Signed)
Discharge instructions given to pt. Pt verbally acknowledged understanding. Pt voiced no questions when prompted. pt refused wheelchair when one offered.  Pt to be transported home by friend per private car.  Will monitor   Andrew AuVafiadis, Everitt Wenner I 03/13/2014 2:55 PM

## 2014-03-13 NOTE — ED Notes (Signed)
Attempted to give report 

## 2014-03-13 NOTE — ED Notes (Signed)
Admitting MD at BS.  

## 2014-03-13 NOTE — Discharge Instructions (Signed)
You were admitted for observation following left sided weakness and hypoglycemia.  We made no changes to your medications.   Please call the clinic on Monday and schedule a follow up visit in 1-2 weeks.

## 2014-03-13 NOTE — ED Notes (Signed)
PT given a Malaw[place]key sandwich, crackers, peanut butter, and OJ per MD.

## 2014-03-13 NOTE — Discharge Summary (Signed)
Family Medicine Teaching [facility] Discharge Summary  Patient name: Sierra Schneider Medical record number: [id number] Date of birth: 10-24-1985 Age: 29 y.o. Gender: female Date of Admission: 03/12/2014  Date of Discharge: 03/13/2013 Admitting Physician: Uvaldo Rising, MD  Primary Care Provider: Myra Rude, MD Consultants: None  Indication for Hospitalization: Weakness and hypoglycemia  Discharge Diagnoses/Problem List:  Patient Active Problem List   Diagnosis Date Noted  . Hypoglycemia 03/13/2014  . Left-sided weakness   . Salivary gland inflammation 08/25/2013  . Acute otitis media 07/03/2013  . Generalized anxiety disorder 06/29/2013  . Acute pharyngitis 04/06/2013  . Pure hypercholesterolemia 01/03/2013  . Duodenal ulcer 10/14/2012  . Epigastric pain 10/13/2012  . Back pain 05/19/2012  . Headache 01/31/2012  . Hematuria 12/10/2011  . URI (upper respiratory infection) 11/23/2011  . Pelvic pain in female 11/12/2011  . Tobacco abuse counseling 04/04/2011  . Depression 12/01/2010  . Health counseling 12/01/2010  . Type I (juvenile type) diabetes mellitus without mention of complication, uncontrolled 09/14/2009   Disposition: Home  Discharge Condition: Improved  Discharge Exam:  Blood pressure 97/58, pulse 94, temperature 98 F (36.7 C), temperature source Oral, resp. rate 16, height  (1.626 m), weight 143 lb 8 oz (65.091 kg), SpO2 99 %.  Gen: pleasant female, NAD HEENT: pupils equal, EOMI, MMM, uvula midline Cardiac: RRR, S1 and S2 present, no murmurs, no heaves/thrills Resp: CTAB, normal effort Neuro: CN 2-12 intact, strength 5/5 intact, no sensation deficits, pronator drift negative  Brief Hospital Course:  Sierra Schneider is a 29 y.o. female presenting with left sided weakness and hypoglycemia. PMH is significant for DM1 and anxiety/depression.  #Left sided weakness: Patient initially presented with inablilty to move left sided extremities.  Sensation was intact. Code stroke was initiated. CT and MRI with no acute intracranial process concerning for stroke. Labs unremarkable. Patient returned back to baseline rapidly. Neurology was consulted and thought this was more likely to be a complicated migraine vs TIA.   #DM1: Patient presented with hypoglycemia of 80 and then 66. She is seen by Dr. Lucianne Muss for her diabetes. Last seen in office 09/2013 with A1c of 9.6. She has not returned to office since. First diagnosed with DM1 2011. Placed on insulin pump approximately 2013. Patient with no cause of hypoglycemia. No infection, no stroke, denies missed meals, recent illness or insulin. Patient was started on d5 for hypoglycemia and taken off pump. However her sugars rapidly corrected. She was placed back on her pump by time of discharge with appropriate blood sugars. Repeat A1c 8.2 this admission. Patient was instructed to follow-up with endocrinology.   #Depression/Anxiety: Patient been on Zoloft for years. Currently denies any anxiety or stressors. Patient noted to have hyperkinetic movements on exam, uncertain if psychological in nature. Patient is on max dose zoloft which can cause hyperkinesia. UDS was  positive for Benzos. Patient says she occasionally takes Valium for sleep (not prescribed). Anxiety and depression were stable this admission.  Issues for Follow Up:  1. Monitor her CBGs  2. She needs follow-up with endocrinology  3. Follow-up hyperkinetic movements. Consider adjusting Zoloft.   Significant Procedures: None  Significant Labs and Imaging:   Recent Labs Lab 03/12/14 2119 03/12/14 2128 03/13/14 0604  WBC 10.9*  --  9.4  HGB 13.1 14.6 13.1  HCT 39.4 43.0 39.5  PLT 331  --  291    Recent Labs Lab 03/12/14 2119 03/12/14 2128 03/13/14 0604  NA 138 138 134*  K 3.8  4.8 4.2  CL 102 100 102  CO2 29  --  28  GLUCOSE 82 81 367*  BUN 6 6 <5*  CREATININE 0.57 0.70 0.60  CALCIUM 8.6  --  8.3*  ALKPHOS 85  --  76  AST  53*  --  37  ALT 49*  --  40*  ALBUMIN 4.1  --  3.3*   Ct Angio Head W/cm &/or Wo Cm 03/13/2014 IMPRESSION: Normal CTA of the head and neck.   Ct Head Wo Contrast 03/12/2014 IMPRESSION: No acute intracranial abnormality.   Mr Brain Wo Contrast 03/12/2014 IMPRESSION: 1. No acute intracranial infarct or other abnormality identified. 2. Mild chronic small vessel ischemic type changes within the central pons. Otherwise normal brain MRI.   Results/Tests Pending at Time of Discharge: None  Discharge Medications:    Medication List    TAKE these medications        busPIRone 15 MG tablet  Commonly known as:  BUSPAR  Take 1 tablet (15 mg total) by mouth 3 (three) times daily as needed.     fluticasone 50 MCG/ACT nasal spray  Commonly known as:  FLONASE  Place 1 spray into the nose 2 (two) times daily.     ibuprofen 600 MG tablet  Commonly known as:  ADVIL,MOTRIN  Take 1 tablet (600 mg total) by mouth every 8 (eight) hours as needed.     insulin aspart 100 UNIT/ML injection  Commonly known as:  NOVOLOG  Use 50 units per day in insulin pump     Lidocaine HCl 0.5 % Gel  Apply 1 application topically as needed.     sertraline 50 MG tablet  Commonly known as:  ZOLOFT  TAKE 4 TABLETS BY MOUTH DAILY NEED TO SCHEDULE AN APPT PER MD        Discharge Instructions: Please refer to Patient Instructions section of EMR for full details.  Patient was counseled important signs and symptoms that should prompt return to medical care, changes in medications, dietary instructions, activity restrictions, and follow up appointments.   Follow-Up Appointments: Follow-up Information    Follow up with Raliegh IpGottschalk, Ashly M, DO On 03/16/2014.   Specialty:  Family Medicine   Why:  @9am  for hospital follow-up   Contact information:   [address] [place] [address] [phone]       Follow up with Reather LittlerKUMAR,AJAY, MD.   Specialty:  Endocrinology   Why:  @11 :30a for hospital follow-up    Contact information:   [address] [place] [address] [phone]       Caryl AdaJazma Phelps, DO PGY-1, [facility]

## 2014-03-13 NOTE — Progress Notes (Signed)
Received report from Rodman KeyBecki L, RN in the ED.

## 2014-03-13 NOTE — Care Management Note (Signed)
    Page 1 of 1   03/13/2014     2:24:56 PM CARE MANAGEMENT NOTE 03/13/2014  Patient:  Sierra Schneider,Sierra Schneider   Account Number:  [id number]  Date Initiated:  03/13/2014  Documentation initiated by:  Sierra Schneider,Sierra Schneider  Subjective/Objective Assessment:   Hypoglycemia and left sided weakness.     Action/Plan:   Medication Assistance   Anticipated DC Date:  03/13/2014   Anticipated DC Plan:  HOME/SELF CARE     DC Planning Services  Medication Assistance  CM consult       Status of service:  Completed  Discharge Disposition:  HOME/SELF CARE  Comments:  03/13/14  1400p  Sierra Renoraci Keyandre Pileggi, RNBSN  [phone] Referral noted for medication assistance.  Spoke w/ pt at bedside in room 4N40C, discussed medication needs and assistance and pt stated that she did not have any problems getting her medications.  Pt stated that she had insurance and could afford the copay.  CM available to assist as needed.  Nurse aware.

## 2014-03-13 NOTE — Progress Notes (Signed)
Pt refuses to wear telemetry. Notified MD on call and central monitoring. MD to arrive to pt's bedside.   Will monitor   Andrew AuVafiadis, Mathayus Stanbery I 03/13/2014 1:37 PM

## 2014-03-13 NOTE — ED Notes (Signed)
Pt still in CT

## 2014-03-13 NOTE — ED Notes (Signed)
Flow called and informed of bed change.

## 2014-03-13 NOTE — H&P (Signed)
Family Medicine Teaching [facility] Admission History and Physical Service Pager: [phone]  Patient name: Sierra Schneider Medical record number: [id number] Date of birth: 21-Jan-1986 Age: 29 y.o. Gender: female  Primary Care Provider: Myra Rude, MD Consultants: Neuro Code Status: Full  Chief Complaint: Left sided weakness with hypoglycemia  Assessment and Plan: [name] is a 29 y.o. female presenting with left sided weakness and hypoglycemia. PMH is significant for DM1 and anxiety/depression.  #Left sided weakness: Patient initially presented with inablilty to move left sided extremities. Sensation was intact. Code stroke was initiated. CT and MRI with no acute intracranial process concerning for stroke. Labs unremarkable. Now patient back to baseline. TIA r/o. This could have been some form of conversion disorder vs complicated migraine in setting of headache. Patient noted to have hyperkinetic movements on exam.  -Admit to telemetry under Dr. Randolm Idol -neuro checks Q2 x2, Q4 x2 -neurology has signed off due to negative workup; they believe this was more likely a complicated migraine vs TIA  #DM1: Patient presented with hypoglycemia of 80 and then 66. She is seen by Dr. Lucianne Muss for her diabetes. Last seen in office 09/2013 with A1c of 9.6. She has not returned to office since. First diagnosed with DM1 2011. Placed on insulin pump approximately 2013. Patient currently with no cause of hypoglycemia. No infection, no stroke, denies missed meals, recent illness or insulin.  -Repeat A1C - AM labs -Moderate SSI  -CBGs QID with meals and qhs -will call her endocrinologist in the morning for recommendations  #Depression/Anxiety: Patient been on Zoloft for years. Of note, patient appears to be withholding information. Currently denies any anxiety or stressors. Patient noted to have hyperkinetic movements on exam, uncertain if psychological in nature. Patient is on max dose zoloft  which can cause hyperkinesia.  - will half zoloft dose for now -UDS positive for Benzos - patient says she occasionally takes Valium for sleep (not prescribed) -consider psych consult -Upreg pending; however patient is reported to have IUD  FEN/GI: Carb modified NS 1/2 MIVF Prophylaxis: Hep Subq  Disposition: Admit for observation.  History of Present Illness: Sierra Schneider is a 29 y.o. female presenting with left sided weakness with hypoglycemia. PMH is significant for DM1 and depression/anxiety. Patient states that she felt like she could not move her left side arm or leg yesterday evening. She states that sensation however was still intact. Patient has never experienced this before. She denies any left sided facial droop or slurred speech. She states that her symptoms gradually improved since being in hospital and now she is back to baseline. Patient denied headache to admitting team but prior ED notes state patient complained of headache. She denies CP, SOB, N/V, confusion, diarrhea, bowel or bladder incontinence or recent illness. Code stroke was called in ED.  While in the ED patient was noted to be hypoglycemic to the 80s and then repeat after was 66. She has an insulin pump. Patient says she has not missed any meals or changed her insulin regimen d/t loss of insurance. She says she last saw her endocrinologist 2 months ago and was changed to [employer] brand novolog due to cheaper price. Patient states she has never had issues with hypoglycemia. In ED, she was started on d5 and insulin pump was removed.   Of note, patient does not appear to be revealing her whole history. She denies any history of anxiety or stressors in her life currently.   Review Of Systems: Per HPI. Otherwise  12 point review of systems was performed and was unremarkable.  Patient Active Problem List   Diagnosis Date Noted  . Hypoglycemia 03/13/2014  . Salivary gland inflammation 08/25/2013  . Acute otitis media  07/03/2013  . Generalized anxiety disorder 06/29/2013  . Acute pharyngitis 04/06/2013  . Pure hypercholesterolemia 01/03/2013  . Duodenal ulcer 10/14/2012  . Epigastric pain 10/13/2012  . Back pain 05/19/2012  . Headache 01/31/2012  . Hematuria 12/10/2011  . URI (upper respiratory infection) 11/23/2011  . Pelvic pain in female 11/12/2011  . Tobacco abuse counseling 04/04/2011  . Depression 12/01/2010  . Health counseling 12/01/2010  . Type I (juvenile type) diabetes mellitus without mention of complication, uncontrolled 09/14/2009   Past Medical History: Past Medical History  Diagnosis Date  . Diabetes mellitus 08/2009    late onset type 1   Past Surgical History: Past Surgical History  Procedure Laterality Date  . Cesarean section    . No past surgeries    . Esophagogastroduodenoscopy Left 10/14/2012    Procedure: ESOPHAGOGASTRODUODENOSCOPY (EGD);  Surgeon: Vertell Novak., MD;  Location: [facility] ENDOSCOPY;  Service: Endoscopy;  Laterality: Left;   Social History: History  Substance Use Topics  . Smoking status: Former Smoker -- 0.00 packs/day    Quit date: 01/26/2014  . Smokeless tobacco: Never Used  . Alcohol Use: Yes     Comment: 1 drink every 3 months   Please also refer to relevant sections of EMR.  Family History: Family History  Problem Relation Age of Onset  . Depression Mother   . Depression Father   . Kidney disease Paternal Grandmother    Allergies and Medications: No Known Allergies No current facility-administered medications on file prior to encounter.   Current Outpatient Prescriptions on File Prior to Encounter  Medication Sig Dispense Refill  . fluticasone (FLONASE) 50 MCG/ACT nasal spray Place 1 spray into the nose 2 (two) times daily. 1 g 2  . ibuprofen (ADVIL,MOTRIN) 600 MG tablet Take 1 tablet (600 mg total) by mouth every 8 (eight) hours as needed. 30 tablet 0  . insulin aspart (NOVOLOG) 100 UNIT/ML injection Use 50 units per day in insulin  pump 2 vial 0  . Lidocaine HCl 0.5 % GEL Apply 1 application topically as needed. 1 Bottle 0  . sertraline (ZOLOFT) 50 MG tablet TAKE 4 TABLETS BY MOUTH DAILY NEED TO SCHEDULE AN APPT PER MD    . busPIRone (BUSPAR) 15 MG tablet Take 1 tablet (15 mg total) by mouth 3 (three) times daily as needed. 90 tablet 1    Objective: BP 119/79 mmHg  Pulse 80  Temp(Src) 97.7 F (36.5 C) (Oral)  Resp 19  Ht  (1.6 m)  Wt 140 lb (63.504 kg)  BMI 24.81 kg/m2  SpO2 100% Exam: General: alert, well-developed, NAD, involuntary movements, ? dystonia HEENT: NCAT. PERRLA, no injection and anicteric. Extraocular motion intact. Dry mucous membranes. Oral mucosa and oropharynx reveal no lesions or exudates. Teeth in good repair. Nodule on L. Side of jaw. Neck: supple, full ROM Lungs: CTAB, normal respiratory effort, no wheezes or rales Heart: RRR, no M/R/G.  Abdomen: Bowel sounds normal; abdomen full and nontender. No masses palpated.  Extremities: No cyanosis, clubbing, edema Neurologic: No focal deficits, CN 2-12 intact,+5 strength globally, sensation grossly intact,  A&Ox3. Deep tendon reflexes symmetrical and normal. Skin: Intact without suspicious lesions or rashes. Warm and dry. Psych: Appears anxious, hyperkinetic movements of extremities and neck/head   Labs and Imaging: Results for orders placed  or performed during the hospital encounter of 03/12/14 (from the past 24 hour(s))  CBG monitoring, ED     Status: Abnormal   Collection Time: 03/12/14  9:07 PM  Result Value Ref Range   Glucose-Capillary 68 (L) 70 - 99 mg/dL  Ethanol     Status: None   Collection Time: 03/12/14  9:19 PM  Result Value Ref Range   Alcohol, Ethyl (B) <5 0 - 9 mg/dL  Protime-INR     Status: None   Collection Time: 03/12/14  9:19 PM  Result Value Ref Range   Prothrombin Time 11.8 11.6 - 15.2 seconds   INR 0.86 0.00 - 1.49  APTT     Status: None   Collection Time: 03/12/14  9:19 PM  Result Value Ref Range   aPTT 28  24 - 37 seconds  CBC     Status: Abnormal   Collection Time: 03/12/14  9:19 PM  Result Value Ref Range   WBC 10.9 (H) 4.0 - 10.5 K/uL   RBC 4.40 3.87 - 5.11 MIL/uL   Hemoglobin 13.1 12.0 - 15.0 g/dL   HCT 16.1 09.6 - 04.5 %   MCV 89.5 78.0 - 100.0 fL   MCH 29.8 26.0 - 34.0 pg   MCHC 33.2 30.0 - 36.0 g/dL   RDW 40.9 81.1 - 91.4 %   Platelets 331 150 - 400 K/uL  Differential     Status: None   Collection Time: 03/12/14  9:19 PM  Result Value Ref Range   Neutrophils Relative % 58 43 - 77 %   Neutro Abs 6.3 1.7 - 7.7 K/uL   Lymphocytes Relative 30 12 - 46 %   Lymphs Abs 3.2 0.7 - 4.0 K/uL   Monocytes Relative 8 3 - 12 %   Monocytes Absolute 0.8 0.1 - 1.0 K/uL   Eosinophils Relative 4 0 - 5 %   Eosinophils Absolute 0.4 0.0 - 0.7 K/uL   Basophils Relative 0 0 - 1 %   Basophils Absolute 0.0 0.0 - 0.1 K/uL  Comprehensive metabolic panel     Status: Abnormal   Collection Time: 03/12/14  9:19 PM  Result Value Ref Range   Sodium 138 135 - 145 mmol/L   Potassium 3.8 3.5 - 5.1 mmol/L   Chloride 102 96 - 112 mEq/L   CO2 29 19 - 32 mmol/L   Glucose, Bld 82 70 - 99 mg/dL   BUN 6 6 - 23 mg/dL   Creatinine, Ser 7.82 0.50 - 1.10 mg/dL   Calcium 8.6 8.4 - 95.6 mg/dL   Total Protein 7.1 6.0 - 8.3 g/dL   Albumin 4.1 3.5 - 5.2 g/dL   AST 53 (H) 0 - 37 U/L   ALT 49 (H) 0 - 35 U/L   Alkaline Phosphatase 85 39 - 117 U/L   Total Bilirubin 0.8 0.3 - 1.2 mg/dL   GFR calc non Af Amer >90 >90 mL/min   GFR calc Af Amer >90 >90 mL/min   Anion gap 7 5 - 15  I-Stat Troponin, ED (not at [facility])     Status: None   Collection Time: 03/12/14  9:21 PM  Result Value Ref Range   Troponin i, poc 0.01 0.00 - 0.08 ng/mL   Comment 3          I-Stat Chem 8, ED     Status: Abnormal   Collection Time: 03/12/14  9:28 PM  Result Value Ref Range   Sodium 138 135 - 145 mmol/L  Potassium 4.8 3.5 - 5.1 mmol/L   Chloride 100 96 - 112 mEq/L   BUN 6 6 - 23 mg/dL   Creatinine, Ser 1.910.70 0.50 - 1.10 mg/dL   Glucose, Bld  81 70 - 99 mg/dL   Calcium, Ion 4.781.06 (L) 1.12 - 1.23 mmol/L   TCO2 28 0 - 100 mmol/L   Hemoglobin 14.6 12.0 - 15.0 g/dL   HCT 29.543.0 62.136.0 - 30.846.0 %  POC CBG, ED     Status: Abnormal   Collection Time: 03/12/14  9:46 PM  Result Value Ref Range   Glucose-Capillary 128 (H) 70 - 99 mg/dL   Comment 1 Documented in Chart    Comment 2 Notify RN   CBG monitoring, ED     Status: None   Collection Time: 03/12/14 10:34 PM  Result Value Ref Range   Glucose-Capillary 80 70 - 99 mg/dL  Urine Drug Screen     Status: Abnormal   Collection Time: 03/12/14 11:05 PM  Result Value Ref Range   Opiates NONE DETECTED NONE DETECTED   Cocaine NONE DETECTED NONE DETECTED   Benzodiazepines POSITIVE (A) NONE DETECTED   Amphetamines NONE DETECTED NONE DETECTED   Tetrahydrocannabinol NONE DETECTED NONE DETECTED   Barbiturates NONE DETECTED NONE DETECTED  Urinalysis, Routine w reflex microscopic     Status: Abnormal   Collection Time: 03/12/14 11:05 PM  Result Value Ref Range   Color, Urine YELLOW YELLOW   APPearance CLEAR CLEAR   Specific Gravity, Urine 1.008 1.005 - 1.030   pH 6.5 5.0 - 8.0   Glucose, UA 250 (A) NEGATIVE mg/dL   Hgb urine dipstick NEGATIVE NEGATIVE   Bilirubin Urine NEGATIVE NEGATIVE   Ketones, ur NEGATIVE NEGATIVE mg/dL   Protein, ur NEGATIVE NEGATIVE mg/dL   Urobilinogen, UA 0.2 0.0 - 1.0 mg/dL   Nitrite NEGATIVE NEGATIVE   Leukocytes, UA NEGATIVE NEGATIVE  CBG monitoring, ED     Status: None   Collection Time: 03/12/14 11:20 PM  Result Value Ref Range   Glucose-Capillary 89 70 - 99 mg/dL  CBG monitoring, ED     Status: Abnormal   Collection Time: 03/13/14 12:14 AM  Result Value Ref Range   Glucose-Capillary 58 (L) 70 - 99 mg/dL  CBG monitoring, ED     Status: Abnormal   Collection Time: 03/13/14 12:59 AM  Result Value Ref Range   Glucose-Capillary 195 (H) 70 - 99 mg/dL  CBG monitoring, ED     Status: Abnormal   Collection Time: 03/13/14  1:28 AM  Result Value Ref Range    Glucose-Capillary 176 (H) 70 - 99 mg/dL   Ct Angio Head W/cm &/or Wo Cm 03/13/2014  IMPRESSION: Normal CTA of the head and neck.    Ct Head Wo Contrast 03/12/2014  IMPRESSION: No acute intracranial abnormality.    Mr Brain Wo Contrast 03/12/2014  IMPRESSION: 1. No acute intracranial infarct or other abnormality identified. 2. Mild chronic small vessel ischemic type changes within the central pons. Otherwise normal brain MRI.     Pincus LargeJazma Y Phelps, DO 03/13/2014, 1:52 AM PGY-1,  Family Medicine FPTS Intern pager: [phone], text pages welcome  I have examined and discussed the patient with the intern and FPTS/attending. Alterations to A/P are in blue to the intern's note.  Felix PaciniKuneff, Renee DO PGY3 [facility]

## 2014-03-13 NOTE — Progress Notes (Signed)
Pt walked out of unit with belongings in hand and friend at side. No acute distress noted   Andrew AuVafiadis, Donal Lynam I 03/13/2014 2:55 PM

## 2014-03-13 NOTE — ED Notes (Signed)
MD at bedside. 

## 2014-03-13 NOTE — ED Notes (Signed)
Per family practice MD would like bed request to be changed to teley floor due to neuro checks, hypoglycemia and prior code stroke status. Dr. Mora Bellmanni made aware.  Will change bed request.

## 2014-03-13 NOTE — ED Provider Notes (Signed)
Patient transferred as a code stroke from [facility].  Please see their note for H&P and details of care up until that point.  Patient evaluated by Neurology upon arrival.  A CT head w/o contrast and an MRI were obtained immediately upon patient's arrival to the ED.  These demonstrated no evidence of CVA.  Neurology feels patient has likely suffered from a migraine with aura since she has now developed a headache since arrival to the ED.  They requested that a CTA of the head and neck be obtained to evaluate for aneurysm or dissection contributing to the patient's symptoms.  Should this be negative they will sign off of the patient's care.  Patient's course is complicated by significant hypogylcemia.  She was treated with an amp of D50 at [facility].  Upon arrival here her insulin pump was removed.  An hour after removal of the insulin pump the patient's glucose continued to drop.  She was started on D5 1/2 NS at 200 an hour and provided with juice and crackers.  Her glucose was checked after another 45 minutes and continued to drop down to 54.  As a result she was treated with an amp of D50 and provided with a meal.  She will require admission to the hospital for her hypoglycemia.  Plan is to admit the patient to the hospital if the CTA of her head and neck are negative.    Labs Review Labs Reviewed  CBC - Abnormal; Notable for the following:    WBC 10.9 (*)    All other components within normal limits  COMPREHENSIVE METABOLIC PANEL - Abnormal; Notable for the following:    AST 53 (*)    ALT 49 (*)    All other components within normal limits  URINE RAPID DRUG SCREEN (HOSP PERFORMED) - Abnormal; Notable for the following:    Benzodiazepines POSITIVE (*)    All other components within normal limits  URINALYSIS, ROUTINE W REFLEX MICROSCOPIC - Abnormal; Notable for the following:    Glucose, UA 250 (*)    All other components within normal limits  COMPREHENSIVE METABOLIC PANEL - Abnormal;  Notable for the following:    Sodium 134 (*)    Glucose, Bld 367 (*)    BUN <5 (*)    Calcium 8.3 (*)    Total Protein 5.7 (*)    Albumin 3.3 (*)    ALT 40 (*)    Anion gap 4 (*)    All other components within normal limits  GLUCOSE, CAPILLARY - Abnormal; Notable for the following:    Glucose-Capillary 357 (*)    All other components within normal limits  LIPID PANEL - Abnormal; Notable for the following:    LDL Cholesterol 112 (*)    All other components within normal limits  I-STAT CHEM 8, ED - Abnormal; Notable for the following:    Calcium, Ion 1.06 (*)    All other components within normal limits  CBG MONITORING, ED - Abnormal; Notable for the following:    Glucose-Capillary 68 (*)    All other components within normal limits  CBG MONITORING, ED - Abnormal; Notable for the following:    Glucose-Capillary 128 (*)    All other components within normal limits  CBG MONITORING, ED - Abnormal; Notable for the following:    Glucose-Capillary 58 (*)    All other components within normal limits  CBG MONITORING, ED - Abnormal; Notable for the following:    Glucose-Capillary 195 (*)  All other components within normal limits  CBG MONITORING, ED - Abnormal; Notable for the following:    Glucose-Capillary 176 (*)    All other components within normal limits  CBG MONITORING, ED - Abnormal; Notable for the following:    Glucose-Capillary 194 (*)    All other components within normal limits  CBG MONITORING, ED - Abnormal; Notable for the following:    Glucose-Capillary 208 (*)    All other components within normal limits  ETHANOL  PROTIME-INR  APTT  DIFFERENTIAL  PREGNANCY, URINE  CBC  GLUCOSE, CAPILLARY  HEMOGLOBIN A1C  I-STAT TROPOININ, ED  I-STAT TROPOININ, ED  CBG MONITORING, ED  CBG MONITORING, ED    Imaging Review Ct Angio Head W/cm &/or Wo Cm  03/13/2014   CLINICAL DATA:  Initial valuation for acute left-sided weakness. Headache.  EXAM: CT ANGIOGRAPHY HEAD AND  NECK  TECHNIQUE: Multidetector CT imaging of the head and neck was performed using the standard protocol during bolus administration of intravenous contrast. Multiplanar CT image reconstructions and MIPs were obtained to evaluate the vascular anatomy. Carotid stenosis measurements (when applicable) are obtained utilizing NASCET criteria, using the distal internal carotid diameter as the denominator.  CONTRAST:  50mL OMNIPAQUE IOHEXOL 350 MG/ML SOLN  COMPARISON:  Prior MRI performed earlier on the same day.  FINDINGS: CTA NECK  Aortic arch: Visualize aortic arch is of normal caliber and appearance with normal 3 vessel morphology. No high-grade stenosis seen at the origin of the great vessels. Subclavian arteries well opacified bilaterally  Right carotid system: Right common and internal carotid arteries well opacified to the level of the skullbase without hemodynamically significant stenosis or significant atheromatous disease.  Left carotid system: Left common and internal carotid artery is well opacified to the level of the skullbase without hemodynamically significant stenosis or significant atheromatous disease.  Vertebral arteries: Both vertebral arteries arise from the subclavian arteries. The vertebral arteries are well opacified to the level of the skullbase without significant stenosis, dissection, or occlusion.  Visualized lungs are clear. No soft tissue abnormality within the neck. Thyroid gland normal. No adenopathy.  CTA HEAD  Anterior circulation: The petrous, cavernous, and supra clinoid segments of the internal carotid arteries are widely patent bilaterally. A1 segments, anterior communicating artery, and anterior cerebral arteries well opacified.  M1 segments widely patent without significant stenosis or occlusion. MCA bifurcations normal. Distal MCA branches within normal limits.  Posterior circulation: Vertebral arteries are codominant and widely patent. Vertebrobasilar junction and basilar artery  are normal. Posterior inferior cerebellar artery is well opacified. Superior cerebellar arteries and posterior cerebral arteries well opacified bilaterally. Small posterior communicating arteries seen bilaterally.  Venous sinuses: No abnormality identified within the venous sinuses.  Anatomic variants: No aneurysm or vascular malformation.  Delayed phase: No abnormal enhancement on delayed sequence.  IMPRESSION: Normal CTA of the head and neck.   Electronically Signed   By: Rise Mu M.D.   On: 03/13/2014 00:51   Ct Head Wo Contrast  03/12/2014   CLINICAL DATA:  Code stroke.  Left-sided weakness.  EXAM: CT HEAD WITHOUT CONTRAST  TECHNIQUE: Contiguous axial images were obtained from the base of the skull through the vertex without intravenous contrast.  COMPARISON:  None.  FINDINGS: No intracranial hemorrhage, mass effect, or midline shift. No hydrocephalus. The basilar cisterns are patent. No evidence of territorial infarct. No intracranial fluid collection. Calvarium is intact. Included paranasal sinuses and mastoid air cells are well aerated.  IMPRESSION: No acute intracranial abnormality.  These results  were called by telephone at the time of interpretation on 03/12/2014 at 9:48 pm to Dr. Ritta Slot , who verbally acknowledged these results.   Electronically Signed   By: Rubye Oaks M.D.   On: 03/12/2014 21:49   Ct Angio Neck W/cm &/or Wo/cm  03/13/2014   CLINICAL DATA:  Initial valuation for acute left-sided weakness. Headache.  EXAM: CT ANGIOGRAPHY HEAD AND NECK  TECHNIQUE: Multidetector CT imaging of the head and neck was performed using the standard protocol during bolus administration of intravenous contrast. Multiplanar CT image reconstructions and MIPs were obtained to evaluate the vascular anatomy. Carotid stenosis measurements (when applicable) are obtained utilizing NASCET criteria, using the distal internal carotid diameter as the denominator.  CONTRAST:  50mL OMNIPAQUE  IOHEXOL 350 MG/ML SOLN  COMPARISON:  Prior MRI performed earlier on the same day.  FINDINGS: CTA NECK  Aortic arch: Visualize aortic arch is of normal caliber and appearance with normal 3 vessel morphology. No high-grade stenosis seen at the origin of the great vessels. Subclavian arteries well opacified bilaterally  Right carotid system: Right common and internal carotid arteries well opacified to the level of the skullbase without hemodynamically significant stenosis or significant atheromatous disease.  Left carotid system: Left common and internal carotid artery is well opacified to the level of the skullbase without hemodynamically significant stenosis or significant atheromatous disease.  Vertebral arteries: Both vertebral arteries arise from the subclavian arteries. The vertebral arteries are well opacified to the level of the skullbase without significant stenosis, dissection, or occlusion.  Visualized lungs are clear. No soft tissue abnormality within the neck. Thyroid gland normal. No adenopathy.  CTA HEAD  Anterior circulation: The petrous, cavernous, and supra clinoid segments of the internal carotid arteries are widely patent bilaterally. A1 segments, anterior communicating artery, and anterior cerebral arteries well opacified.  M1 segments widely patent without significant stenosis or occlusion. MCA bifurcations normal. Distal MCA branches within normal limits.  Posterior circulation: Vertebral arteries are codominant and widely patent. Vertebrobasilar junction and basilar artery are normal. Posterior inferior cerebellar artery is well opacified. Superior cerebellar arteries and posterior cerebral arteries well opacified bilaterally. Small posterior communicating arteries seen bilaterally.  Venous sinuses: No abnormality identified within the venous sinuses.  Anatomic variants: No aneurysm or vascular malformation.  Delayed phase: No abnormal enhancement on delayed sequence.  IMPRESSION: Normal CTA of  the head and neck.   Electronically Signed   By: Rise Mu M.D.   On: 03/13/2014 00:51   Mr Brain Wo Contrast  03/12/2014   CLINICAL DATA:  Initial evaluation for acute onset left-sided weakness, now resolved. Evaluate for possible stroke.  EXAM: MRI HEAD WITHOUT CONTRAST  TECHNIQUE: Multiplanar, multiecho pulse sequences of the brain and surrounding structures were obtained without intravenous contrast.  COMPARISON:  Prior head CT from earlier the same day.  FINDINGS: The CSF containing spaces are within normal limits for patient age. Mild chronic small vessel ischemic changes present within the pons. No mass lesion, midline shift, or extra-axial fluid collection. Ventricles are normal in size without evidence of hydrocephalus.  No diffusion-weighted signal abnormality is identified to suggest acute intracranial infarct. Gray-white matter differentiation is maintained. Normal flow voids are seen within the intracranial vasculature. No intracranial hemorrhage identified.  The cervicomedullary junction is normal. Pituitary gland is within normal limits. Pituitary stalk is midline. The globes and optic nerves demonstrate a normal appearance with normal signal intensity. The  The bone marrow signal intensity is normal. Calvarium is intact. Visualized upper cervical  spine is within normal limits.  Scalp soft tissues are unremarkable.  Paranasal sinuses are clear.  No mastoid effusion.  IMPRESSION: 1. No acute intracranial infarct or other abnormality identified. 2. Mild chronic small vessel ischemic type changes within the central pons. Otherwise normal brain MRI.   Electronically Signed   By: Rise Mu M.D.   On: 03/12/2014 23:06     1. Weakness of left arm   2. Stroke   3. CVA (cerebral infarction)   4. Left-sided weakness   5. Hypoglycemia     Bethann Berkshire, MD 03/13/14 [id number]  Gwyneth Sprout, MD 03/14/14 0003

## 2014-03-13 NOTE — Progress Notes (Signed)
Pt arrived to unit via stretcher from the ED.  Pt oriented to room and plan of care.  Vitals and assessment stable.  Will continue to monitor

## 2014-03-15 ENCOUNTER — Other Ambulatory Visit: Payer: Self-pay | Admitting: *Deleted

## 2014-03-15 LAB — GLUCOSE, CAPILLARY: Glucose-Capillary: 184 mg/dL — ABNORMAL HIGH (ref 70–99)

## 2014-03-16 ENCOUNTER — Encounter: Payer: Self-pay | Admitting: Family Medicine

## 2014-03-16 ENCOUNTER — Other Ambulatory Visit: Payer: Self-pay | Admitting: *Deleted

## 2014-03-16 ENCOUNTER — Ambulatory Visit (INDEPENDENT_AMBULATORY_CARE_PROVIDER_SITE_OTHER): Payer: BLUE CROSS/BLUE SHIELD | Admitting: Family Medicine

## 2014-03-16 ENCOUNTER — Encounter: Payer: Self-pay | Admitting: Endocrinology

## 2014-03-16 ENCOUNTER — Ambulatory Visit (INDEPENDENT_AMBULATORY_CARE_PROVIDER_SITE_OTHER): Payer: BLUE CROSS/BLUE SHIELD | Admitting: Endocrinology

## 2014-03-16 VITALS — BP 100/66 | HR 86 | Temp 97.7°F | Resp 14 | Ht 64.0 in | Wt 141.4 lb

## 2014-03-16 VITALS — BP 114/67 | HR 84 | Temp 98.3°F | Ht 64.0 in | Wt 141.4 lb

## 2014-03-16 DIAGNOSIS — IMO0002 Reserved for concepts with insufficient information to code with codable children: Secondary | ICD-10-CM

## 2014-03-16 DIAGNOSIS — E1065 Type 1 diabetes mellitus with hyperglycemia: Secondary | ICD-10-CM

## 2014-03-16 DIAGNOSIS — M6289 Other specified disorders of muscle: Secondary | ICD-10-CM

## 2014-03-16 DIAGNOSIS — L732 Hidradenitis suppurativa: Secondary | ICD-10-CM | POA: Diagnosis not present

## 2014-03-16 DIAGNOSIS — R5383 Other fatigue: Secondary | ICD-10-CM

## 2014-03-16 DIAGNOSIS — R531 Weakness: Secondary | ICD-10-CM

## 2014-03-16 DIAGNOSIS — K1120 Sialoadenitis, unspecified: Secondary | ICD-10-CM

## 2014-03-16 DIAGNOSIS — K115 Sialolithiasis: Secondary | ICD-10-CM | POA: Insufficient documentation

## 2014-03-16 MED ORDER — INSULIN LISPRO 100 UNIT/ML ~~LOC~~ SOLN: SUBCUTANEOUS | Status: DC

## 2014-03-16 NOTE — Patient Instructions (Signed)
Check cost of Apidra or Humalog  Increase basal by 0.2 all day  Aspirin 81mg  daily

## 2014-03-16 NOTE — Assessment & Plan Note (Addendum)
On LEFT lower jaw line.  +TTP on exam.  No evidence of infection.  Patient is able to tolerate food/drink. Likely sialolithiasis.  Son with h/o parotid gland stones. -Continue to watch.   -If no improvement will consider EENT referral -F/U 6 weeks or sooner if increased size/swelling/if hot/develops fevers/ cannot eat/drink.  Precepted with Dr Jennette KettleNeal

## 2014-03-16 NOTE — Assessment & Plan Note (Signed)
On LEFT axilla.  No evidence of infection. -Keep area clean.  Consider not shaving under arms/ taking a break from antiperspirant  -Continue to follow -Patient to f/u in 6 weeks or sooner if increasing in size/increased pain/ fevers/ swelling

## 2014-03-16 NOTE — Progress Notes (Signed)
Patient ID: Sierra Schneider, female   DOB: [birth year], 29 y.o.   MRN: [id number]   Reason for Appointment: Insulin Pump followup:   History of Present Illness   Diagnosis: Type 1 DIABETES MELITUS     DIABETES history: She has had diabetes since about 2011 and has been on insulin pump for at least 2 years She had fairly good control in the first 2 years of her disease but blood sugar has been more difficult in the last year at least CURRENT insulin pump:  [employer]  She is here for  followup after about 5 months, she came in because of being recently admitted to the hospital For the last few months has been having difficulty with her insurance coverage and was getting Novolin regular insulin instead of NovoLog She said that because of this her blood sugars have been out of control and fairly consistently over 200 and sometimes higher She was also requiring about twice as much insulin to cover her meals and high sugars She appears to have had persistently high A1c since 2014; however A1c in the hospital was 8.2% which is not significantly higher than before  Difficult to analyze her blood sugar patterns as she has no readings in her pump except for the last 3 days when she has entered manually; she has not been able to link her meter to the pump She says after a hospitalization her blood sugars have been consistently over 300 and also was having readings over 500 yesterday despite multiple boluses. Previously has had difficulty covering higher fat meals with enough insulin and is still not doing much better on her diet. Also has been noncompliant with her followup She says that she has not had a functioning glucose monitor for the last couple of weeks and has only 3 days of blood sugar readings on her pump. She does not think she has had much hypoglycemia lately; she was having a reading of 80 when she went to the ER although blood sugar did drop after she got there. Pump download  indicates that she is taking at least 5 boluses per day on average Although her basal rate gives her only 17 units she is using an average of 76 units per day with 60% in boluses  The pump SETTINGS are: Basal rate: Midnight = 0.85, 6 AM = 0.85 , 9 AM = 0.6, 6 PM = 0.6and 7 PM = 0.45.  Total basal insulin = 17 units Carb Ratio: 1:8. Correction factor 1: 30, target 100  GLUCOSE readings ranging from 334 up to 562; did have a reading of 45 on 1/14 at 1 AM   Wt Readings from Last 3 Encounters:  03/16/14 141 lb 6.4 oz (64.139 kg)  03/16/14 141 lb 6.4 oz (64.139 kg)  03/13/14 143 lb 8 oz (65.091 kg)     LABS: Lab Results  Component Value Date   HGBA1C 8.2* 03/13/2014   HGBA1C 9.6* 09/28/2013   HGBA1C 8.2* 04/10/2013   Lab Results  Component Value Date   MICROALBUR 0.2 09/28/2013   LDLCALC 112* 03/13/2014   CREATININE 0.60 03/13/2014        Medication List       This list is accurate as of: 03/16/14 11:32 AM.  Always use your most recent med list.               fluticasone 50 MCG/ACT nasal spray  Commonly known as:  FLONASE  Place 1 spray into the nose 2 (  two) times daily.     ibuprofen 600 MG tablet  Commonly known as:  ADVIL,MOTRIN  Take 1 tablet (600 mg total) by mouth every 8 (eight) hours as needed.     insulin aspart 100 UNIT/ML injection  Commonly known as:  NOVOLOG  Use 50 units per day in insulin pump     insulin regular 100 units/mL injection  Commonly known as:  NOVOLIN R,HUMULIN R  Inject 100 Units into the skin at bedtime. With insulin pump     Lidocaine HCl 0.5 % Gel  Apply 1 application topically as needed.     sertraline 50 MG tablet  Commonly known as:  ZOLOFT  TAKE 4 TABLETS BY MOUTH DAILY NEED TO SCHEDULE AN APPT PER MD        Allergies: No Known Allergies  Past Medical History  Diagnosis Date  . Diabetes mellitus 08/2009    late onset type 1    Past Surgical History  Procedure Laterality Date  . Cesarean section    . No past  surgeries    . Esophagogastroduodenoscopy Left 10/14/2012    Procedure: ESOPHAGOGASTRODUODENOSCOPY (EGD);  Surgeon: Vertell NovakJames L Edwards Jr., MD;  [facility];  Service: Endoscopy;  Laterality: Left;    Family History  Problem Relation Age of Onset  . Depression Mother   . Depression Father   . Kidney disease Paternal Grandmother     Social History:  reports that she quit smoking about 7 weeks ago. She has never used smokeless tobacco. She reports that she drinks alcohol. She reports that she does not use illicit drugs.  REVIEW of systems:   She was recently admitted to the hospital with an episode of left arm weakness without associated symptoms. Neurologist was not clear whether she had a migraine equivalent or TIA Radiological studies were all negative except for some small vessel ischemic changes in the pons She has not been recommended any aspirin  No history of hypertension  She has had mild hypercholesterolemia. She has a relatively high fat diet especially at  work  Lab Results  Component Value Date   CHOL 189 03/13/2014   HDL 54 03/13/2014   LDLCALC 112* 03/13/2014   LDLDIRECT 129.4 12/29/2012   TRIG 113 03/13/2014   CHOLHDL 3.5 03/13/2014    She is concerned about her weight gain.  Also has been feeling more tired especially when her blood sugars have been higher   Lab Results  Component Value Date   TSH 1.092 01/01/2011    she has been taking Zoloft for depression for quite some time  EXAM:  BP 100/66 mmHg  Pulse 86  Temp(Src) 97.7 F (36.5 C)  Resp 14  Ht 5\' 4"  (1.626 m)  Wt 141 lb 6.4 oz (64.139 kg)  BMI 24.26 kg/m2  SpO2 98%  Right lobe of thyroid minimally palpable Reflexes are normal in upper extremities  ASSESSMENT:  DIABETES: Her blood sugars are difficult to assess because of  her not entering her blood sugars in the pump until recently She thinks her blood sugars are poorly controlled because of not using analog insulin and only  Novolin regular Also not clear why her sugars are significantly higher since her hospitalization; she has been concerned about her possible TIA episode As discussed above  A1c is 8.2% and not as high as expected from her history  See history of present illness for details of her current management, blood sugar patterns   ?  TIA: Hospital records were reviewed  and not clear if the diagnosis has been established   Recommendations:    She will start linking the glucose monitor to the pump with help of the nurse educator today  Start Humalog insulin instead of regular insulin and increase basal rates by 0.2 round the clock for now and may reduce back when blood sugars are near-normal  Continue same bolus settings; call if blood sugar is not better by Friday  Reduce high-fat intake  Check thyroid level on the next visit  81 mg aspirin daily  Review home management and insulin doses on next visit in 3 weeks    Counseling time over 50% of today's 25 minute visit  Donnalyn Juran 03/16/2014, 11:32 AM

## 2014-03-16 NOTE — Assessment & Plan Note (Signed)
Resolved.  No recurrence.  Exam NORMAL -Continue to follow. -Patient counseled on recurrence of symptoms.  She is NOT to delay going to ED.

## 2014-03-16 NOTE — Patient Instructions (Signed)
It was a pleasure seeing you today, [name]!  Information regarding what we discussed is included in this packet.  Please make an appointment to see me in 4-6 weeks.  You may use Aleve or Ibuprofen for the next 2-3 weeks for pain relief.  If area gets bigger, hot, swollen see me back sooner.  Schedule an appointment with [name] for financial eval and possible help with meds.  Please feel free to call our office at [phone] if any questions or concerns arise.  Warm Regards, Alfred Eckley M. Nadine CountsGottschalk, DO   Salivary Stone Your exam shows you have a stone in one of your saliva glands. These small stones form around a mucous plug in the ducts of the glands and cause the saliva in the gland to be blocked. This makes the gland swollen and painful, especially when you eat. If repeated episodes occur, the gland can become infected. Sometimes these stones can be seen on x-ray. Treatment includes stimulating the production of saliva to push the stone out. You should suck on a lemon or sour candies several times daily. Antibiotic medicine may be needed if the gland is infected. Increasing fluids, applying warm compresses to the swollen area 3-4 times daily, and massaging the gland from back to front may encourage drainage and passage of the stone. Surgical treatment to remove the stone is sometimes necessary, so proper medical follow up is very important. Call your doctor for an appointment as recommended. Call right away if you have a high fever, severe headache, vomiting, uncontrolled pain, or other serious symptoms. Document Released: 03/22/2004 Document Revised: 05/07/2011 Document Reviewed: 02/12/2005 [facility] Patient Information 2015 [place]. This information is not intended to replace advice given to you by your health care provider. Make sure you discuss any questions you have with your health care provider.

## 2014-03-16 NOTE — Assessment & Plan Note (Signed)
Patient to see Dr Lucianne MussKumar today 03/16/14.  She voices concern over financial ability to obtain Novolog. Using insulin pump.  CBGs NOT well controlled on novolin R.  BG 45->500.   -Recommend appt with [name].  Consider MAP for insulin -F/U PRN

## 2014-03-16 NOTE — Progress Notes (Signed)
Patient ID: Sierra Schneider, female   DOB: 28-Jul-1985, 29 y.o.   MRN: [id number]    Subjective:  CC:ED follow up on L sided weakness, lump on L jaw and lump under L armpit HPI: Patient is a 29 y.o. female presenting to clinic today for as above. Concerns today include:  ED f/u L sided weakness Patient has not had any recurrence of weakness since hospitalization.  She denies weakness, vision changes, headache, numbness/tingling/sensation changes, falls.  Patient reports that her symptoms were thought to be d/t sugar vs TIA.  She is going to see Dr Lucianne MussKumar today for T1DM f/u.  She reports that she has not been able to afford her Novolog and has been using OTC Novolin R.  CBGs 45-500.  Patient denies any symptoms of hyper or hypoglycemia  Lump under L arm Has been there for about 1 week.  Not increasing in size.  Has been using warm compresses.  No bleeding or exudate. Endorses some TTP.  No fevers.  No breast changes.   Lump on L jawline Has been there since Friday.  Is getting smaller.  NO increased pain with eating/ drinking.  Only TTP.  No fevers. No exudate. No bleeding.  No drooling.  Social History Reviewed: former smoker. FamHx and MedHx updated.  Please see EMR. Health Maintenance: Pap, Tdap, Flu vaccine, DM foot exam due  ROS: All other systems reviewed and are negative.  Objective: Office vital signs reviewed. BP 114/67 mmHg  Pulse 84  Temp(Src) 98.3 F (36.8 C) (Oral)  Ht 5\' 4"  (1.626 m)  Wt 141 lb 6.4 oz (64.139 kg)  BMI 24.26 kg/m2  Physical Examination:  General: Awake, alert, well nourished, anxious appearing female, NAD HEENT: Normal    Neck: No masses palpated. No LAD    Eyes:  EOMI    Nose: nasal turbinates moist    Mouth/Throat: MMM, no erythema, no swelling./discoloration in oral cavity.   L external cheek/jawline with palpable well circumscribed, smooth, pea-sized mass, +TTP, no erythema or skin changes  Extremities: WWP, No edema, cyanosis or clubbing; +2  pulses bilaterally  L axilla no erythema, edema, induration, exudate or bleeding. +well circumscribed, smooth navy bean sized mass  MSK: Normal gait and station Skin: dry, intact, no rashes or lesions Neuro: 5/5 LE and UE Strength, sensation grossly intact, L 4-5 DTRs 2/4, follows commands, speech normal, no focal deficits  Assessment: 29 y.o. female here for ED follow up on L sided weakness with inflamed sweat gland and salivary gland inflammation.  Plan: See Problem List and After Visit Summary   Raliegh IpAshly M Glenna Brunkow, DO PGY-1, [facility]

## 2014-03-17 ENCOUNTER — Telehealth: Payer: Self-pay | Admitting: Endocrinology

## 2014-03-17 NOTE — Telephone Encounter (Signed)
Patient would like to know is her fax was received for Humalog approval. Please advise

## 2014-03-22 NOTE — Telephone Encounter (Signed)
Her insurance prefers NovoLog and she will need to stay on this

## 2014-03-22 NOTE — Telephone Encounter (Signed)
Patient would like for you to call her concerning her medication. °

## 2014-03-22 NOTE — Telephone Encounter (Signed)
Patient was under the impression that you were going to have her on Humalog long term, she was able to get the free vial of Humalog, I instructed her to call her insurance company and find out what's preferred,

## 2014-03-23 ENCOUNTER — Other Ambulatory Visit: Payer: Self-pay | Admitting: *Deleted

## 2014-03-23 MED ORDER — INSULIN ASPART 100 UNIT/ML ~~LOC~~ SOLN: SUBCUTANEOUS | Status: DC

## 2014-03-23 NOTE — Telephone Encounter (Signed)
rx sent

## 2014-03-23 NOTE — Telephone Encounter (Signed)
Patient wants to know if you can increase her Novolog to 65 units a day, this will allow her to get two vials per month and keep her from running out. Please advise

## 2014-03-23 NOTE — Telephone Encounter (Signed)
ok 

## 2014-03-24 ENCOUNTER — Telehealth: Payer: Self-pay | Admitting: Endocrinology

## 2014-03-24 NOTE — Telephone Encounter (Signed)
Patient states she would need her pump supplies sent by fax to   [phone]   Thank you

## 2014-04-08 ENCOUNTER — Ambulatory Visit: Payer: BLUE CROSS/BLUE SHIELD | Admitting: Endocrinology

## 2014-04-09 ENCOUNTER — Ambulatory Visit: Payer: BLUE CROSS/BLUE SHIELD | Admitting: Endocrinology

## 2014-04-27 LAB — HM DIABETES EYE EXAM

## 2014-04-30 ENCOUNTER — Other Ambulatory Visit: Payer: Self-pay | Admitting: Family Medicine

## 2014-04-30 DIAGNOSIS — F32A Depression, unspecified: Secondary | ICD-10-CM

## 2014-04-30 DIAGNOSIS — F329 Major depressive disorder, single episode, unspecified: Secondary | ICD-10-CM

## 2014-04-30 MED ORDER — SERTRALINE HCL 50 MG PO TABS: ORAL_TABLET | ORAL | Status: DC

## 2014-04-30 NOTE — Telephone Encounter (Signed)
Left VM. Tried to call patient about zoloft dose. She needs an appt at this point for her zoloft. Will give one refill.   Sierra RudeJeremy E Demetrius Mahler, MD PGY-2, [facility] 04/30/2014, 12:04 PM

## 2014-04-30 NOTE — Telephone Encounter (Signed)
Pt is requesting refill on zoloft, is completely out, goes to [facility]/[address]

## 2014-05-28 ENCOUNTER — Ambulatory Visit (INDEPENDENT_AMBULATORY_CARE_PROVIDER_SITE_OTHER): Payer: BLUE CROSS/BLUE SHIELD | Admitting: Family Medicine

## 2014-05-28 VITALS — BP 102/75 | HR 88 | Temp 98.4°F | Wt 142.4 lb

## 2014-05-28 DIAGNOSIS — M542 Cervicalgia: Secondary | ICD-10-CM

## 2014-05-28 MED ORDER — GABAPENTIN 100 MG PO CAPS: 100.0000 mg | ORAL_CAPSULE | Freq: Three times a day (TID) | ORAL | Status: DC | PRN

## 2014-05-28 MED ORDER — CYCLOBENZAPRINE HCL 5 MG PO TABS: 5.0000 mg | ORAL_TABLET | Freq: Three times a day (TID) | ORAL | Status: DC | PRN

## 2014-05-28 NOTE — Progress Notes (Signed)
Patient ID: Sierra Schneider, female   DOB: Jul 16, 1985, 29 y.o.   MRN: [id number] Subjective:   CC: Neck pain  HPI:   Patient presents to sameday clinic for chronic neck pain for years that has flared up over the past 2 months and is now "unbearable." Pain feels like muscle spasm but also causes some burning pain with no radiation down arm but radiating in trapezius distribution on right neck making it hard to move neck in any direction. She gets flares ~3x/year and usually they resolve with antiinflammatories and flexeril. She has been out of flexeril and antiinflammatory (ibuprofen 6-800mg  2-3x/day for 2 weeks) has not been helping. 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 Flexeril helps but she cannot take it at work and wonders if there is something else she could try. She denies weakness, redness, warmth, deformity, trauma/increased lifting, or other concerns.   Review of Systems - Per HPI.   PMH - back pain, depression, GAD, hidradenitis, history of pelvic pain, hyperlipidemia, tobacco abuse, uncontrolled type 1 diabetes  Objective:  Physical Exam BP 102/75 mmHg  Pulse 88  Temp(Src) 98.4 F (36.9 C) (Oral)  Wt 142 lb 6.4 oz (64.592 kg) GEN: NAD EXTR/BACK: Right paraspinal tenderness to palpation extending from occiput to shoulder to mid-back in trapezius distribution; mild increased tone and height of this muscle compared to left; sitting with neck very straight; no redness, rash, induration, or warmth; generalized decreased ROM of c-spine with reproduced pain but has full ROM; also has full shoulder ROM to flexion and abduction but with some reproduced pain  Procedure:  Trigger point injection of right trapezius  Consent obtained and verified. Time-out conducted. Noted no overlying erythema, induration, or other signs of  local infection. Skin prepped in a sterile fashion. Topical analgesic spray: Ethyl chloride. Completed without difficulty. Meds: 2mL 2% lidocaine without epinephrine Slight pain improvement immediately, suggesting accurate placement of the medication. Advised to call if fevers/chills, erythema, induration, drainage, or persistent bleeding.  Assessment:     Sierra Schneider is a 29 y.o. female here for right sided neck pain.    Plan:     # See problem list and after visit summary for problem-specific plans.  # Health Maintenance: Not discussed  Follow-up: Follow up in 1-2 weeks PRN no improvement of pain.    Leona SingletonMaria T Lessie Funderburke, MD [facility]

## 2014-05-28 NOTE — Patient Instructions (Signed)
It was good to meet you today. This definitely sounds like a trapezius muscle spasm. Muscle spasms are usually treated with massage, heat, and stretching and strengthening exercises. I would also recommend you get an ergonomic evaluation of your work desk. I'm referring you to physical therapy for neck pain. We are doing a trigger point injection today. Try some kind of range of motion exercises like swimming or yoga. I'm also ordering a cervical spine x-ray because you have been dealing with this so frequently. Follow-up in one to 2 weeks if needed.

## 2014-05-29 DIAGNOSIS — M542 Cervicalgia: Secondary | ICD-10-CM | POA: Insufficient documentation

## 2014-05-29 NOTE — Assessment & Plan Note (Addendum)
Likely trapezius muscle spasm with distribution and exam findings. No weakness or atrophy.  - Trigger point injection with lidocaine (no steroid because poorly controlled diabetic). - C-spine xray given chronicity  - Represcribed flexeril (pt only uses at night due to drowsiness), trial gabapentin low dose during day due to neuropathic component; discussed this may also cause drowsiness. Tylenol PRN; avoid NSAID for now given 2 weeks consistent use. - Continue heat and massage. - Discussed prevention - Ask for ergonomic eval of your workstation. - PT referral; also trial Yoga/ swimming/stretching regularly. - F/u 2 weeks with PCP if not improving.

## 2014-06-07 NOTE — Telephone Encounter (Signed)
error 

## 2014-06-22 ENCOUNTER — Other Ambulatory Visit: Payer: Self-pay | Admitting: Family Medicine

## 2014-06-22 DIAGNOSIS — F32A Depression, unspecified: Secondary | ICD-10-CM

## 2014-06-22 DIAGNOSIS — F329 Major depressive disorder, single episode, unspecified: Secondary | ICD-10-CM

## 2014-07-05 ENCOUNTER — Ambulatory Visit (INDEPENDENT_AMBULATORY_CARE_PROVIDER_SITE_OTHER): Payer: BLUE CROSS/BLUE SHIELD | Admitting: Family Medicine

## 2014-07-05 ENCOUNTER — Encounter: Payer: Self-pay | Admitting: Family Medicine

## 2014-07-05 VITALS — BP 116/77 | HR 86 | Temp 98.3°F | Ht 64.0 in | Wt 144.2 lb

## 2014-07-05 DIAGNOSIS — IMO0002 Reserved for concepts with insufficient information to code with codable children: Secondary | ICD-10-CM

## 2014-07-05 DIAGNOSIS — F32A Depression, unspecified: Secondary | ICD-10-CM

## 2014-07-05 DIAGNOSIS — F329 Major depressive disorder, single episode, unspecified: Secondary | ICD-10-CM | POA: Diagnosis not present

## 2014-07-05 DIAGNOSIS — F411 Generalized anxiety disorder: Secondary | ICD-10-CM

## 2014-07-05 DIAGNOSIS — E1065 Type 1 diabetes mellitus with hyperglycemia: Secondary | ICD-10-CM

## 2014-07-05 LAB — POCT GLYCOSYLATED HEMOGLOBIN (HGB A1C): Hemoglobin A1C: 8.3

## 2014-07-05 LAB — FERRITIN: Ferritin: 120 ng/mL (ref 10–291)

## 2014-07-05 NOTE — Progress Notes (Signed)
Subjective:    Patient ID: Sierra Schneider Weinel, female    DOB: Sep 16, 1985, 29 y.o.   MRN: [id number]  HPI  Sierra Schneider Tessler is here for depression and dm f/u.  DEPRESSION Disease Monitoring Current symptoms include depressed mood, hypersomnia and insomnia             Symptoms have been stable      Is Exercising 1 per week. 45 minutes    Evidence of suicidal ideation: no  Medication Monitoring:  Compliance: intermittent use.   Decreased Libido no       Lightheadedness no      Insomnia yes. Trouble with falling asleep and staying asleep. Getting 2 hours of sleep. C/w restless leg which is new. Started about a month ago. She had it when she was pregnant.  GI symptoms no  Previous treatment includes: Buspar, wellbutrin, prozac in her teens.    Type 1 DM.  She was diagnosed about 6 years ago.  Disease Monitoring  Blood Sugar Ranges: no ranges at home. She is on the pump She is followed with Dr. Lucianne MussKumar and has f/u end of this month.   Polyuria: no   Visual problems: yes, just went to ophthalmologist    Medication Compliance: yes  Medication Side Effects  Hypoglycemia: no   Preventitive Health Care  Eye Exam: recently   Exercise: 1 day per week   24-hr recall:  (UP at  AM) B ( AM)- sausage biscuit   Snk ( AM)-  no L ( PM)- sandwich [place]key  Snk ( PM)- no snack   Schneider ( PM)- steak, pasta salad, baked potatoe  Snk ( PM)-  None    Current Outpatient Prescriptions on File Prior to Visit  Medication Sig Dispense Refill  . cyclobenzaprine (FLEXERIL) 5 MG tablet Take 1 tablet (5 mg total) by mouth 3 (three) times daily as needed for muscle spasms. 20 tablet 0  . fluticasone (FLONASE) 50 MCG/ACT nasal spray Place 1 spray into the nose 2 (two) times daily. 1 g 2  . gabapentin (NEURONTIN) 100 MG capsule Take 1 capsule (100 mg total) by mouth 3 (three) times daily as needed. Start with once daily, only when having spasm 20 capsule 0  . ibuprofen (ADVIL,MOTRIN) 600 MG tablet Take 1  tablet (600 mg total) by mouth every 8 (eight) hours as needed. 30 tablet 0  . insulin aspart (NOVOLOG) 100 UNIT/ML injection Use 65 units per day in insulin pump 2 vial 3  . insulin lispro (HUMALOG) 100 UNIT/ML injection Use max 80 units per day with insulin pump 10 mL 1  . insulin regular (NOVOLIN R,HUMULIN R) 100 units/mL injection Inject 100 Units into the skin at bedtime. With insulin pump    . Lidocaine HCl 0.5 % GEL Apply 1 application topically as needed. 1 Bottle 0  . sertraline (ZOLOFT) 50 MG tablet TAKE FOUR TABLETS BY MOUTH ONCE DAILY **NEED  TO  SCHEDULE  AN  APPOINTMENT** 60 tablet 1   No current facility-administered medications on file prior to visit.    SHx: recently quit smoking   Health Maintenance: needs pap smear     Review of Systems See HPI     Objective:   Physical Exam BP 116/77 mmHg  Pulse 86  Temp(Src) 98.3 F (36.8 C) (Oral)  Ht 5\' 4"  (1.626 m)  Wt 144 lb 3.2 oz (65.409 kg)  BMI 24.74 kg/m2 Gen: NAD, alert, cooperative with exam, well-appearing HEENT: NCAT, clear conjunctiva,  CV: RRR,  good S1/S2, no murmur, no edema, capillary refill brisk  Resp: CTABL, no wheezes, non-labored Skin: no rashes, normal turgor  Neuro: no gross deficits.  Psych: normal affect       Assessment & Plan:

## 2014-07-05 NOTE — Assessment & Plan Note (Signed)
She has been on zoloft for some time and doesn't seem to be working.  Tried wellbutrin and buspar before. She quit then because she didn't like the way they made her feel.  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 titrating down 50 mg every three days. Will start either effexor or cymbalta once at a dose of 50 mg daily  - may need to try wellbutrin or buspar again in the future as a adjunct therapy  - discussed with Dr. Raymondo BandKoval

## 2014-07-05 NOTE — Patient Instructions (Signed)
Thank you for coming in,   I will speak with our pharmacy to try to come up with the best medication regimen to treat your depression.   Please bring all of your medications with you to each visit.    Please feel free to call with any questions or concerns at any time, at [phone]. --Dr. Jordan LikesSchmitz  Diet Recommendations for Diabetes   Starchy (carb) foods include: Bread, rice, pasta, potatoes, corn, crackers, bagels, muffins, all baked goods.   Protein foods include: Meat, fish, poultry, eggs, dairy foods, and beans such as pinto and kidney beans (beans also provide carbohydrate).   1. Eat at least 3 meals and 1-2 snacks per day. Never go more than 4-5 hours while awake without eating.  2. Limit starchy foods to TWO per meal and ONE per snack. ONE portion of a starchy  food is equal to the following:   - ONE slice of bread (or its equivalent, such as half of a hamburger bun).   - 1/2 cup of a "scoopable" starchy food such as potatoes or rice.   - 1 OUNCE (28 grams) of starchy snack foods such as crackers or pretzels (look on label).   - 15 grams of carbohydrate as shown on food label.  3. Both lunch and dinner should include a protein food, a carb food, and vegetables.   - Obtain twice as many veg's as protein or carbohydrate foods for both lunch and dinner.   - Try to keep frozen veg's on hand for a quick vegetable serving.     - Fresh or frozen veg's are best.  4. Breakfast should always include protein.

## 2014-07-05 NOTE — Assessment & Plan Note (Signed)
Uncontrolled type 1. She is on an insulin pump. She is followed by Dr. Lucianne MussKumar and has f/u scheduled  - f/u in three months  - labs today

## 2014-07-06 ENCOUNTER — Telehealth: Payer: Self-pay | Admitting: Family Medicine

## 2014-07-06 DIAGNOSIS — M542 Cervicalgia: Secondary | ICD-10-CM

## 2014-07-06 DIAGNOSIS — G2581 Restless legs syndrome: Secondary | ICD-10-CM

## 2014-07-06 DIAGNOSIS — F329 Major depressive disorder, single episode, unspecified: Secondary | ICD-10-CM

## 2014-07-06 DIAGNOSIS — F32A Depression, unspecified: Secondary | ICD-10-CM

## 2014-07-06 NOTE — Telephone Encounter (Signed)
Pt calling again because she has not received a phone call, and would like to know id her PCP has called in her medication or not. Thanks, HoneywellSadie Schneider, ASA

## 2014-07-06 NOTE — Telephone Encounter (Signed)
Pt called because when was seen on 07/05/14 she was told that a medication will be called in for her after the doctor talked to Dr. Raymondo BandKoval. What haas been decided. Please let patient know. jw

## 2014-07-07 MED ORDER — GABAPENTIN 100 MG PO CAPS: 100.0000 mg | ORAL_CAPSULE | Freq: Three times a day (TID) | ORAL | Status: DC

## 2014-07-07 MED ORDER — VENLAFAXINE HCL ER 37.5 MG PO CP24: 37.5000 mg | ORAL_CAPSULE | Freq: Every day | ORAL | Status: DC

## 2014-07-07 NOTE — Telephone Encounter (Signed)
Spoke with patient about the changes to her medications.  Will titrate off of her zoloft.  Today, Thurs, Friday: 150 mg  Sat, Sun, Mon: 100 mg  Tues, Wed, Thurs: 50 mg and start Effexor on Tues  Stop taking Zoloft on Fri 5/20  Will start Gabapentin for her RLS. Will start at 100 mg QHS, to make sure that she tolerates it. Will try this for 5 days then will increase to 100 mg TID. Her Ferritin was found to be normal. She understood these directions.   Myra RudeJeremy E Schmitz, MD PGY-2, [facility] 07/07/2014, 8:26 AM

## 2014-07-27 ENCOUNTER — Telehealth: Payer: Self-pay | Admitting: Endocrinology

## 2014-07-27 NOTE — Telephone Encounter (Signed)
Patient will need to be seen before we give any other refills, she hasn't been here since January.

## 2014-07-27 NOTE — Telephone Encounter (Signed)
[facility] note dated 07/25/14 t 4 pm  Caller states she is out of insulin. Novolog is insulin. [facility] on [address] phone number [phone]. sliding scale and is on insulin pump according to directive, nurse can call in maintenance medications so medication will be called in to pharmacy where medication has been ordered previously.  Raechel AcheDuncan, [place] 07/25/2014 4:53:43 PM Spoke with On Call - General Message Result novolog 65 units per day per pump (per vial). Dr. Para Marchuncan authorized 2 vials and pt to f/u with Dr. Lucianne MussKumar ASAP or contact Dr. Lucianne MussKumar for further questions.

## 2014-08-05 ENCOUNTER — Ambulatory Visit (INDEPENDENT_AMBULATORY_CARE_PROVIDER_SITE_OTHER): Payer: BLUE CROSS/BLUE SHIELD | Admitting: Family Medicine

## 2014-08-05 ENCOUNTER — Encounter: Payer: Self-pay | Admitting: Family Medicine

## 2014-08-05 VITALS — BP 106/74 | HR 97 | Temp 98.0°F | Ht 64.75 in | Wt 147.4 lb

## 2014-08-05 DIAGNOSIS — R21 Rash and other nonspecific skin eruption: Secondary | ICD-10-CM | POA: Diagnosis not present

## 2014-08-05 MED ORDER — HYDROXYZINE PAMOATE 25 MG PO CAPS: 25.0000 mg | ORAL_CAPSULE | Freq: Four times a day (QID) | ORAL | Status: DC | PRN

## 2014-08-05 MED ORDER — PREDNISONE 20 MG PO TABS: 20.0000 mg | ORAL_TABLET | Freq: Every day | ORAL | Status: DC

## 2014-08-05 MED ORDER — TRIAMCINOLONE ACETONIDE 0.1 % EX OINT: 1.0000 "application " | TOPICAL_OINTMENT | Freq: Two times a day (BID) | CUTANEOUS | Status: DC

## 2014-08-05 NOTE — Patient Instructions (Signed)
It was great seeing you today.   1. Take prednisone 20mg  for the next several days, if the itching / rash improves then continue with 10-20 mg of prednisone for 1 week. If the rash /itching is not improving then increase to 40 mg of prednisone for 1 week and taper off.  2. Keep track of your blood sugars while on prednisone and increase insulin as needed.  3. Take Vistaril 25mg  every 6 hours as needed for itching   If you have any questions or concerns before then, please call the clinic at [phone].  Take Care,   Dr Wenda Low

## 2014-08-05 NOTE — Progress Notes (Signed)
   Subjective:    Patient ID: Sierra Schneider, female    DOB: Dec 07, 1985, 29 y.o.   MRN: [id number]  Seen for Same day visit for   CC: rash  Reports itchy rash x 1 week after working outside at home. Rash on both arms, neck, ankles and medial thighs. Extremely itchy. No pain. No fevers, chills. No new exposures.   Review of Systems   See HPI for ROS. Objective:  BP 106/74 mmHg  Pulse 97  Temp(Src) 98 F (36.7 C) (Oral)  Ht 5' 4.75" (1.645 m)  Wt 147 lb 7 oz (66.877 kg)  BMI 24.71 kg/m2  General: NAD Skin: Multiple excoriations and papules on bilateral arms, ankles and inner thighs. No vesicles    Assessment & Plan:  See Problem List Documentation

## 2014-08-09 ENCOUNTER — Ambulatory Visit (INDEPENDENT_AMBULATORY_CARE_PROVIDER_SITE_OTHER): Payer: BLUE CROSS/BLUE SHIELD | Admitting: Family Medicine

## 2014-08-09 ENCOUNTER — Encounter: Payer: Self-pay | Admitting: Family Medicine

## 2014-08-09 VITALS — BP 111/85 | HR 84 | Temp 97.7°F | Ht 64.0 in | Wt 149.5 lb

## 2014-08-09 DIAGNOSIS — R21 Rash and other nonspecific skin eruption: Secondary | ICD-10-CM

## 2014-08-09 DIAGNOSIS — F329 Major depressive disorder, single episode, unspecified: Secondary | ICD-10-CM | POA: Diagnosis not present

## 2014-08-09 DIAGNOSIS — F32A Depression, unspecified: Secondary | ICD-10-CM

## 2014-08-09 MED ORDER — DULOXETINE HCL 30 MG PO CPEP: 30.0000 mg | ORAL_CAPSULE | Freq: Every day | ORAL | Status: DC

## 2014-08-09 NOTE — Progress Notes (Signed)
   Subjective:    Patient ID: Sierra Schneider, female    DOB: [birth year], 29 y.o.   MRN: [id number]  HPI  Sierra Schneider is here for depression f/u.   DEPRESSION Disease Monitoring Current symptoms include depressed mood, fatigue and insomnia             Symptoms have been stable      Is Exercising yes    Evidence of suicidal ideation: no   Medication Monitoring Compliance: not currently on medications for this problem       Insomnia no   GI symptoms no  PMH Previous treatment includes: Buspar, wellbutrin, prozac in her teens. Zoloft: took the max amount and started having side effects. Effexor: didn't tolerate.   Rash: seen on 08/05/14 and given prednisone and atarax for suspected contact dermatitis. Still having rashes pop up.  .   Current Outpatient Prescriptions on File Prior to Visit  Medication Sig Dispense Refill  . cyclobenzaprine (FLEXERIL) 5 MG tablet Take 1 tablet (5 mg total) by mouth 3 (three) times daily as needed for muscle spasms. 20 tablet 0  . fluticasone (FLONASE) 50 MCG/ACT nasal spray Place 1 spray into the nose 2 (two) times daily. 1 g 2  . gabapentin (NEURONTIN) 100 MG capsule Take 1 capsule (100 mg total) by mouth 3 (three) times daily. Start with once daily for 5 days, then increase to three times a day 90 capsule 0  . hydrOXYzine (VISTARIL) 25 MG capsule Take 1 capsule (25 mg total) by mouth every 6 (six) hours as needed. 40 capsule 0  . ibuprofen (ADVIL,MOTRIN) 600 MG tablet Take 1 tablet (600 mg total) by mouth every 8 (eight) hours as needed. 30 tablet 0  . insulin aspart (NOVOLOG) 100 UNIT/ML injection Use 65 units per day in insulin pump 2 vial 3  . insulin lispro (HUMALOG) 100 UNIT/ML injection Use max 80 units per day with insulin pump 10 mL 1  . insulin regular (NOVOLIN R,HUMULIN R) 100 units/mL injection Inject 100 Units into the skin at bedtime. With insulin pump    . Lidocaine HCl 0.5 % GEL Apply 1 application topically as needed. 1 Bottle 0    . predniSONE (DELTASONE) 20 MG tablet Take 1 tablet (20 mg total) by mouth daily with breakfast. 50 tablet 0  . triamcinolone ointment (KENALOG) 0.1 % Apply 1 application topically 2 (two) times daily. 30 g 0   No current facility-administered medications on file prior to visit.    SHx: still working   Health Maintenance: needs pap smear    Review of Systems See HPI      Objective:   Physical Exam BP 111/85 mmHg  Pulse 84  Temp(Src) 97.7 F (36.5 C) (Oral)  Ht 5\' 4"  (1.626 m)  Wt 149 lb 8 oz (67.813 kg)  BMI 25.65 kg/m2 Gen: NAD, alert, cooperative with exam, well-appearing Skin: several excoriations throughout her arms, legs, trunk. No face involvement  Neuro: no gross deficits.  Psych: good insight, alert and oriented      Assessment & Plan:

## 2014-08-09 NOTE — Patient Instructions (Signed)
Thank you for coming in,   Let me know how the cymbalta is working. Please call me if you have any problems.    Please bring all of your medications with you to each visit.    Please feel free to call with any questions or concerns at any time, at [phone]. --Dr. Jordan Likes

## 2014-08-09 NOTE — Assessment & Plan Note (Signed)
Most likely contact dermatitis. Started on prednisone 6/9. She is taking 20 mg daily  - Will take 40 mg for 5 days  - 20 mg for three days  - 10 mg for two days  - has atarax and doesn't make her sleepy  - discussed with Dr. Raymondo Band given she is a type 1 Dm

## 2014-08-09 NOTE — Assessment & Plan Note (Signed)
Currently not taking any medications. Knows how to deal with it since she has had it for so long.  She doesn't want to be referred or see a psychiatrist  - start cymbalta 30 mg  - if no improvement can increase  - if no improvement could consider adding wellbutrin or buspar

## 2014-08-11 ENCOUNTER — Encounter: Payer: Self-pay | Admitting: Endocrinology

## 2014-08-11 ENCOUNTER — Ambulatory Visit (INDEPENDENT_AMBULATORY_CARE_PROVIDER_SITE_OTHER): Payer: BLUE CROSS/BLUE SHIELD | Admitting: Endocrinology

## 2014-08-11 VITALS — BP 110/72 | HR 87 | Temp 99.4°F | Resp 16 | Wt 147.0 lb

## 2014-08-11 DIAGNOSIS — E1065 Type 1 diabetes mellitus with hyperglycemia: Secondary | ICD-10-CM | POA: Diagnosis not present

## 2014-08-11 DIAGNOSIS — IMO0002 Reserved for concepts with insufficient information to code with codable children: Secondary | ICD-10-CM

## 2014-08-11 NOTE — Progress Notes (Signed)
Patient ID: Sierra Schneider, female   DOB: 1985/05/16, 29 y.o.   MRN: [id number]   Reason for Appointment: Insulin Pump followup:   History of Present Illness   Diagnosis: Type 1 DIABETES MELITUS     DIABETES history: She has had diabetes since about 2011 and has been on insulin pump for at least 2 years She had fairly good control in the first 2 years of her disease but blood sugar has been more difficult in the last year at least  CURRENT insulin pump:  One Touch Ping SETTINGS are: Basal rate: Midnight = 0.9, 6 AM = 0.875 , 9 AM = 0.625, 12 noon = 0.8 , 6 PM = 0.6 and 7 PM = 0.45.  Total basal insulin = 17.55 units Carb Ratio: 1:8. Correction factor 1: 30, target 100    She still has difficulty getting her  Blood sugars under control and has been irregular with her follow-up.  On her last visit she was told to link her glucose monitor to the pump but she has done this only in the last few days.  A1c from PCP was 8.3.  Difficult to analyze her blood sugar patterns as she has minimal readings in the pump and these are mostly higher from her taking steroids recently  Current blood sugar patterns and problems identified:   she thinks her fasting blood sugars are close to 200 frequently although she does have one reading of 140 recently   She also thinks her blood sugars are relatively higher before SUPPER time but also does tend to have high fat meals at lunch periodically and does not extend her bolus for this.  She does try to take about 2 units more for higher fat meals   Her blood sugars are much higher in the afternoon with taking prednisone recently but she checks her blood sugar somewhat sporadically again ; highest blood sugar was over 500 on Monday night when her steroid was increased to 40 mg   She generally not overriding her pump   She does not think she has had much hypoglycemia lately  Pump download indicates that she is taking at least  6 boluses per day on  average Although her basal rate gives her only 17 units she is using an average of 72 units per day with  55% in boluses   She says she is gaining weight from  Stopping smoking last year   Wt Readings from Last 3 Encounters:  08/11/14 147 lb (66.679 kg)  08/09/14 149 lb 8 oz (67.813 kg)  08/05/14 147 lb 7 oz (66.877 kg)     LABS: Lab Results  Component Value Date   HGBA1C 8.3 07/05/2014   HGBA1C 8.2* 03/13/2014   HGBA1C 9.6* 09/28/2013   Lab Results  Component Value Date   MICROALBUR 0.2 09/28/2013   LDLCALC 112* 03/13/2014   CREATININE 0.60 03/13/2014        Medication List       This list is accurate as of: 08/11/14  4:15 PM.  Always use your most recent med list.               cyclobenzaprine 5 MG tablet  Commonly known as:  FLEXERIL  Take 1 tablet (5 mg total) by mouth 3 (three) times daily as needed for muscle spasms.     DULoxetine 30 MG capsule  Commonly known as:  CYMBALTA  Take 1 capsule (30 mg total) by mouth daily.     fluticasone  50 MCG/ACT nasal spray  Commonly known as:  FLONASE  Place 1 spray into the nose 2 (two) times daily.     gabapentin 100 MG capsule  Commonly known as:  NEURONTIN  Take 1 capsule (100 mg total) by mouth 3 (three) times daily. Start with once daily for 5 days, then increase to three times a day     hydrOXYzine 25 MG capsule  Commonly known as:  VISTARIL  Take 1 capsule (25 mg total) by mouth every 6 (six) hours as needed.     ibuprofen 600 MG tablet  Commonly known as:  ADVIL,MOTRIN  Take 1 tablet (600 mg total) by mouth every 8 (eight) hours as needed.     insulin aspart 100 UNIT/ML injection  Commonly known as:  NOVOLOG  Use 65 units per day in insulin pump     insulin lispro 100 UNIT/ML injection  Commonly known as:  HUMALOG  Use max 80 units per day with insulin pump     insulin regular 100 units/mL injection  Commonly known as:  NOVOLIN R,HUMULIN R  Inject 100 Units into the skin at bedtime. With insulin  pump     Lidocaine HCl 0.5 % Gel  Apply 1 application topically as needed.     predniSONE 20 MG tablet  Commonly known as:  DELTASONE  Take 1 tablet (20 mg total) by mouth daily with breakfast.     triamcinolone ointment 0.1 %  Commonly known as:  KENALOG  Apply 1 application topically 2 (two) times daily.        Allergies: No Known Allergies  Past Medical History  Diagnosis Date  . Diabetes mellitus 08/2009    late onset type 1    Past Surgical History  Procedure Laterality Date  . Cesarean section    . No past surgeries    . Esophagogastroduodenoscopy Left 10/14/2012    Procedure: ESOPHAGOGASTRODUODENOSCOPY (EGD);  Surgeon: Vertell Novak., MD;  Location: [facility] ENDOSCOPY;  Service: Endoscopy;  Laterality: Left;    Family History  Problem Relation Age of Onset  . Depression Mother   . Depression Father   . Kidney disease Paternal Grandmother     Social History:  reports that she quit smoking about 6 months ago. She has never used smokeless tobacco. She reports that she drinks alcohol. She reports that she does not use illicit drugs.  REVIEW of systems:   She was previously admitted to the hospital with an episode of left arm weakness without associated symptoms.   No history of hypertension  She has had mild hypercholesterolemia. She has a relatively high fat diet especially at  work  Lab Results  Component Value Date   CHOL 189 03/13/2014   HDL 54 03/13/2014   LDLCALC 112* 03/13/2014   LDLDIRECT 129.4 12/29/2012   TRIG 113 03/13/2014   CHOLHDL 3.5 03/13/2014       She has been taking  Cymbalta for depression now and also prescribed gabapentin by PCP   EXAM:  BP 110/72 mmHg  Pulse 87  Temp(Src) 99.4 F (37.4 C) (Oral)  Resp 16  Wt 147 lb (66.679 kg)  SpO2 96%   she has a mostly macular rash scattered on her extremities  And upper neck  ASSESSMENT:  DIABETES: Her blood sugars are difficult to assess because of  her not entering her blood  sugars in the pump until recently and this was discussed on the last visit also See history of present illness for detailed  discussion of his current management, blood sugar patterns and problems identified  Most likely her fasting blood sugars are usually high but not because of high fat meal at night Also her blood sugars probably are higher before supper because of usually taking high fat meals ; also not extending her bolus for this  As before she is checking her blood sugars frequently and not enough to help her manage her diabetes properly  Currently her blood sugars are much higher because of using prednisone and she has not been able to control the sugars usually with extra boluses  Recommendations:    She will start  Checking her blood sugars 4 times a day as directed including some after meals   use the combination bolus for high fat meals especially at lunch along with adding 3-4 units more   temporarily will increase her basal rates    Increase her 5 AM basal rate to 0.95 until 9 AM to help with fasting readings  Review home management and insulin doses on next visit in 6 weeks   Patient Instructions  Noon basal 1.2 and 6 pm to 0.9 while on prednisone  Lunch combo bolus and extra 3-4 units   Change 6 am basal to 5 am and use 0.95 units  Test 4x daily     Counseling time on subjects discussed above is over 50% of today's 25 minute visit    Julia Alkhatib 08/11/2014, 4:15 PM

## 2014-08-11 NOTE — Patient Instructions (Addendum)
Noon basal 1.2 and 6 pm to 0.9 while on prednisone  Lunch combo bolus and extra 3-4 units   Change 6 am basal to 5 am and use 0.95 units  Test 4x daily

## 2014-08-16 ENCOUNTER — Other Ambulatory Visit: Payer: Self-pay | Admitting: Family Medicine

## 2014-08-16 ENCOUNTER — Other Ambulatory Visit: Payer: Self-pay | Admitting: *Deleted

## 2014-08-16 ENCOUNTER — Telehealth: Payer: Self-pay | Admitting: Endocrinology

## 2014-08-16 MED ORDER — INSULIN ASPART 100 UNIT/ML ~~LOC~~ SOLN: SUBCUTANEOUS | Status: DC

## 2014-08-16 NOTE — Telephone Encounter (Signed)
Pt needs refill on her novlog

## 2014-09-10 ENCOUNTER — Telehealth: Payer: Self-pay | Admitting: Family Medicine

## 2014-09-10 DIAGNOSIS — F329 Major depressive disorder, single episode, unspecified: Secondary | ICD-10-CM

## 2014-09-10 DIAGNOSIS — F32A Depression, unspecified: Secondary | ICD-10-CM

## 2014-09-10 MED ORDER — DULOXETINE HCL 60 MG PO CPEP: 60.0000 mg | ORAL_CAPSULE | Freq: Every day | ORAL | Status: DC

## 2014-09-10 NOTE — Telephone Encounter (Signed)
Requesting refill on cymbalta, would like to have the dosage increased, says this one seems to be working ok but does want to increase, would like MD to call if there are any questions. Pt goes to [facility]/[address]

## 2014-09-10 NOTE — Telephone Encounter (Signed)
Spoke with patient. She is doing well on the medication but feels that she will see a better outcome if it is increased. Will send in Cymbalta 60 mg QD.   Myra RudeJeremy E Schmitz, MD PGY-3, [facility] 09/10/2014, 4:47 PM

## 2014-09-22 ENCOUNTER — Encounter: Payer: Self-pay | Admitting: Endocrinology

## 2014-09-22 ENCOUNTER — Ambulatory Visit (INDEPENDENT_AMBULATORY_CARE_PROVIDER_SITE_OTHER): Payer: BLUE CROSS/BLUE SHIELD | Admitting: Endocrinology

## 2014-09-22 VITALS — BP 110/70 | HR 111 | Temp 98.0°F | Resp 16 | Ht 64.0 in | Wt 153.0 lb

## 2014-09-22 DIAGNOSIS — E1065 Type 1 diabetes mellitus with hyperglycemia: Secondary | ICD-10-CM

## 2014-09-22 DIAGNOSIS — E7800 Pure hypercholesterolemia, unspecified: Secondary | ICD-10-CM

## 2014-09-22 DIAGNOSIS — IMO0002 Reserved for concepts with insufficient information to code with codable children: Secondary | ICD-10-CM

## 2014-09-22 NOTE — Patient Instructions (Signed)
Reduce bolus before cardio in 1/2

## 2014-09-22 NOTE — Progress Notes (Signed)
Patient ID: Sierra Schneider, female   DOB: March 13, 1985, 29 y.o.   MRN: [id number]   Reason for Appointment: Insulin Pump followup:   History of Present Illness   Diagnosis: Type 1 DIABETES MELITUS     DIABETES history: She has had diabetes since about 2011 and has been on insulin pump for at least 2 years She had fairly good control in the first 2 years of her disease but blood sugar has been more difficult in the last year at least  CURRENT insulin pump:  One Touch Ping  SETTINGS are: Basal rate: Midnight = 0.9, 6 AM = 0.95 , 9 AM = 0.625, 12 noon = 1.2 , 6 PM = 0.9 and 7 PM = 0.45.  Total basal insulin = 20.5 units Carb Ratio: 1:8. Correction factor 1: 30, target 100   A1c from PCP was 8.3 in 5/15. On her last visit in 6/15 she was having poor control partly because of needing to take steroids for rash Her basal rates were increased but she was told to go back to her previous basal rates and her sugars came down after getting off steroids; however she did not reduce her basal rates back down   Current blood sugar patterns and problems identified:   She  is having significant hypoglycemia now and this is mostly midmorning and before lunch; she did not notify us about this and has not changed her settings because of this  FASTING blood sugars are inconsistent with some readings over 200 but also has a couple of normal readings; has only one reading during the night which was over 200  Blood sugars are the lowest around lunchtime including several low blood sugars, some of the low sugars are within 2-3 hours after the morning boluses  Postprandial readings are generally low are near-normal after breakfast and lunch but not enough readings done after supper  She is still not using any extended boluses despite advising her on covering high-fat meals, this may be causing higher readings overnight on some days   She is overriding boluses about one time a day  She is concerned about her  weight gain  Mean values apply above for all meters except median for One Touch  PRE-MEAL Fasting Lunch Dinner Bedtime Overall  Glucose range:  52-314  35-152   51-153   48, 113, 218    Mean/median:  185   100   127    128    Pump download indicates that she is taking about 4-5 boluses per day on average Total daily insulin on average is 65 units, 69% in boluses   Wt Readings from Last 3 Encounters:  09/22/14 153 lb (69.4 kg)  08/11/14 147 lb (66.679 kg)  08/09/14 149 lb 8 oz (67.813 kg)     LABS: Lab Results  Component Value Date   HGBA1C 8.3 07/05/2014   HGBA1C 8.2* 03/13/2014   HGBA1C 9.6* 09/28/2013   Lab Results  Component Value Date   MICROALBUR 0.2 09/28/2013   LDLCALC 112* 03/13/2014   CREATININE 0.60 03/13/2014        Medication List       This list is accurate as of: 09/22/14 11:59 PM.  Always use your most recent med list.               cyclobenzaprine 5 MG tablet  Commonly known as:  FLEXERIL  Take 1 tablet (5 mg total) by mouth 3 (three) times daily as needed for muscle spasms.  DULoxetine 60 MG capsule  Commonly known as:  CYMBALTA  Take 1 capsule (60 mg total) by mouth daily.     fluticasone 50 MCG/ACT nasal spray  Commonly known as:  FLONASE  Place 1 spray into the nose 2 (two) times daily.     gabapentin 100 MG capsule  Commonly known as:  NEURONTIN  Take 1 capsule (100 mg total) by mouth 3 (three) times daily. Start with once daily for 5 days, then increase to three times a day     hydrOXYzine 25 MG capsule  Commonly known as:  VISTARIL  Take 1 capsule (25 mg total) by mouth every 6 (six) hours as needed.     ibuprofen 600 MG tablet  Commonly known as:  ADVIL,MOTRIN  Take 1 tablet (600 mg total) by mouth every 8 (eight) hours as needed.     insulin aspart 100 UNIT/ML injection  Commonly known as:  NOVOLOG  Use 65 units per day in insulin pump     insulin lispro 100 UNIT/ML injection  Commonly known as:  HUMALOG  Use max 80  units per day with insulin pump     insulin regular 100 units/mL injection  Commonly known as:  NOVOLIN R,HUMULIN R  Inject 100 Units into the skin at bedtime. With insulin pump     Lidocaine HCl 0.5 % Gel  Apply 1 application topically as needed.     predniSONE 20 MG tablet  Commonly known as:  DELTASONE  Take 1 tablet (20 mg total) by mouth daily with breakfast.     triamcinolone ointment 0.1 %  Commonly known as:  KENALOG  Apply 1 application topically 2 (two) times daily.        Allergies: No Known Allergies  Past Medical History  Diagnosis Date  . Diabetes mellitus 08/2009    late onset type 1    Past Surgical History  Procedure Laterality Date  . Cesarean section    . No past surgeries    . Esophagogastroduodenoscopy Left 10/14/2012    Procedure: ESOPHAGOGASTRODUODENOSCOPY (EGD);  Surgeon: Vertell Novak., MD;  Location: [facility] ENDOSCOPY;  Service: Endoscopy;  Laterality: Left;    Family History  Problem Relation Age of Onset  . Depression Mother   . Depression Father   . Kidney disease Paternal Grandmother     Social History:  reports that she quit smoking about 7 months ago. She has never used smokeless tobacco. She reports that she drinks alcohol. She reports that she does not use illicit drugs.  REVIEW of systems:   She was previously admitted to the hospital with an episode of left arm weakness without associated symptoms.   No history of hypertension  She has had mild hypercholesterolemia. She has a relatively high fat diet especially at  work  Lab Results  Component Value Date   CHOL 189 03/13/2014   HDL 54 03/13/2014   LDLCALC 112* 03/13/2014   LDLDIRECT 129.4 12/29/2012   TRIG 113 03/13/2014   CHOLHDL 3.5 03/13/2014       She has been taking  Cymbalta for depression now and also prescribed gabapentin by PCP   EXAM:  BP 110/70 mmHg  Pulse 111  Temp(Src) 98 F (36.7 C)  Resp 16  Ht 5\' 4"  (1.626 m)  Wt 153 lb (69.4 kg)  BMI 26.25  kg/m2  SpO2 97%   ASSESSMENT:    DIABETES: Her blood sugars are overall much better at home with increasing her basal rate See history  of present illness for detailed discussion of his current management, blood sugar patterns and problems identified She is starting to get more frequent hypoglycemia now which is unusual for her; most of her low blood sugars are midmorning and before lunch and at least once after her evening aerobic exercise However she is also gaining weight and this is partly related to her quitting smoking and use of prednisone previously  POSTPRANDIAL readings are lower after breakfast and possibly after lunch also; has not been doing enough readings after supper to assess her boluses Also she has not used extended boluses for higher fat meals as directed FASTING blood sugars are sporadically higher, possibly from higher fat meals the night before. She is however better compliant with checking her blood sugars more often   Recommendations:    She will adjust her basal rates to reduce 9 AM basal rate to 0.55  Consider reducing basal rate at suppertime if needed  She will call her fasting blood sugars are consistently high and may need to increase early morning basal rate  Start checking blood sugars after supper more consistently to identify postprandial hyperglycemia  Use extended boluses and extra insulin for high fat meals as directed  Carbohydrate coverage 1:10 for all meals for now  Reduce bolus by 50% when planning to do aerobic exercise after dinner  Recheck A1c in 2 months  Encouraged her to reduce high carbohydrate and high fat meals to prevent further weight gain  Increase fiber in diet  Patient Instructions  Reduce bolus before cardio in 1/2     Counseling time on subjects discussed above is over 50% of today's 25 minute visit    Gwenyth Dingee 09/23/2014, 9:30 AM

## 2014-09-23 ENCOUNTER — Encounter: Payer: Self-pay | Admitting: *Deleted

## 2014-09-29 ENCOUNTER — Telehealth: Payer: Self-pay | Admitting: Endocrinology

## 2014-09-29 ENCOUNTER — Telehealth: Payer: Self-pay | Admitting: Nutrition

## 2014-09-29 NOTE — Telephone Encounter (Signed)
Please see below.

## 2014-09-29 NOTE — Telephone Encounter (Signed)
Patient reports that her FBSs are still high.  Today it was 330, yesterday it was 400.  She tested last night 2hr. pcS and it was 101.  She had 2 small cookies (14 carbs each). and never  tests at HS or at all during the night. Basal rates: MN: 0.9,  5AM: 0.95,  9AM: 0.55,  12PM: 1.2,  6PM: 0.9,  7PM: 0.45.

## 2014-09-29 NOTE — Telephone Encounter (Signed)
Bonita Quin please get more details, thanks

## 2014-09-29 NOTE — Telephone Encounter (Signed)
Gave patient new basal rates.  She re verbalized the new rates correctly, and said she could changes these without any assistance from me.

## 2014-09-29 NOTE — Telephone Encounter (Signed)
Pt are still high running from 330-430 this has occurred since her last visit

## 2014-09-29 NOTE — Telephone Encounter (Signed)
Increase basal rate at midnight to 1.0 and start a 4 AM basal rate to 1.1 until 9 AM

## 2014-10-01 ENCOUNTER — Telehealth: Payer: Self-pay | Admitting: Endocrinology

## 2014-10-01 ENCOUNTER — Other Ambulatory Visit: Payer: Self-pay | Admitting: Endocrinology

## 2014-10-01 NOTE — Telephone Encounter (Signed)
rx was sent earlier today, patient is aware

## 2014-10-01 NOTE — Telephone Encounter (Signed)
Patient need a new prescription of NOVOLOG 100 UNIT/ML injection. Send to [id number] - [place] ([place]), [place] - [address] [phone] (Phone) [phone] (Fax)

## 2014-10-22 ENCOUNTER — Encounter: Payer: Self-pay | Admitting: Family Medicine

## 2014-10-22 ENCOUNTER — Ambulatory Visit (INDEPENDENT_AMBULATORY_CARE_PROVIDER_SITE_OTHER): Payer: BLUE CROSS/BLUE SHIELD | Admitting: Family Medicine

## 2014-10-22 VITALS — BP 106/65 | HR 87 | Temp 98.4°F | Ht 64.0 in | Wt 150.3 lb

## 2014-10-22 DIAGNOSIS — J329 Chronic sinusitis, unspecified: Secondary | ICD-10-CM | POA: Diagnosis not present

## 2014-10-22 MED ORDER — FLUCONAZOLE 150 MG PO TABS: 150.0000 mg | ORAL_TABLET | Freq: Once | ORAL | Status: DC

## 2014-10-22 MED ORDER — ONDANSETRON HCL 4 MG PO TABS: 4.0000 mg | ORAL_TABLET | Freq: Three times a day (TID) | ORAL | Status: DC | PRN

## 2014-10-22 MED ORDER — FLUTICASONE PROPIONATE 50 MCG/ACT NA SUSP: 2.0000 | Freq: Every day | NASAL | Status: DC

## 2014-10-22 MED ORDER — CETIRIZINE HCL 10 MG PO TABS: 10.0000 mg | ORAL_TABLET | Freq: Every day | ORAL | Status: DC

## 2014-10-22 MED ORDER — AMOXICILLIN-POT CLAVULANATE 875-125 MG PO TABS: 1.0000 | ORAL_TABLET | Freq: Two times a day (BID) | ORAL | Status: DC

## 2014-10-22 NOTE — Patient Instructions (Signed)
Take augmentin twice a day for 10 days Sent in diflucan in case you get a yeast infection Also sent in zofran for nausea  Take zyrtec daily and flonase daily as well Follow up in 2 weeks with Dr. Jordan Likes to discuss allergy control and possible referral to ENT. Continue to use neti pot, use with distilled water  Return sooner if worsening or not getting better  Be well, Dr. Pollie Meyer

## 2014-10-22 NOTE — Progress Notes (Signed)
Patient ID: Sierra Schneider, female   DOB: 12/06/1985, 29 y.o.   MRN: [id number]  HPI:  Pt presents for a same day appointment to discuss sinus infection.  Reports having sinus congestion for about 2 weeks. Has used neti pot without relief. Feels constant pressure in face and tenderness over sinuses. Has hx of multiple prior sinus infections, about 3 per year on average. Has had 3-4 so far this year. Previously was treated with azithromycin. Also endorsing feeling "motion sickness". Has used dramamine and has been walking slowly. Some decreased hearing from congestion in ears. Had low grade fever to 100 (no higher than this) a few days ago. Eating and drinking fine. Has been nauseated from the dizziness and vomited on Sunday. Feels like she is on a boat.   ROS: See HPI  PMFSH: hx T1DM, depression, GAD, HLD  PHYSICAL EXAM: BP 106/65 mmHg  Pulse 87  Temp(Src) 98.4 F (36.9 C) (Oral)  Ht  (1.626 m)  Wt 150 lb 4.8 oz (68.176 kg)  BMI 25.79 kg/m2 Gen: NAD, pleasant, cooperative HEENT: NCAT. Tonsils enlarged and erythematous without exudate. Some enlarged anterior cervical lymph nodes present. Nares with congestion visible. TM's clear bilaterally, with bubbles visible behind TM on R side. Sinuses tender to palpation throughout. Heart: RRR no murmur Lungs: CTAB, NWOB, no crackles or wheezes Neuro: grossly nonfocal, speech normal Skin: no rashes noted  ASSESSMENT/PLAN:  Recurrent sinusitis Sx's consistent with acute sinusitis. Will treat with augmentin x 10 days. Also encourage use of daily zyrtec and flonase for allergy treatment. F/u with PCP in 2-3 weeks, sooner if not better. Would consider ENT referral at that time given recurrent nature of sinus infections.     FOLLOW UP: F/u in 2 weeks with PCP for recurrent sinusitis  [place] J. Pollie Meyer, MD [facility]

## 2014-10-23 DIAGNOSIS — J329 Chronic sinusitis, unspecified: Secondary | ICD-10-CM | POA: Insufficient documentation

## 2014-10-23 NOTE — Assessment & Plan Note (Signed)
Sx's consistent with acute sinusitis. Will treat with augmentin x 10 days. Also encourage use of daily zyrtec and flonase for allergy treatment. F/u with PCP in 2-3 weeks, sooner if not better. Would consider ENT referral at that time given recurrent nature of sinus infections.

## 2014-11-16 ENCOUNTER — Telehealth: Payer: Self-pay | Admitting: Family Medicine

## 2014-11-16 DIAGNOSIS — F329 Major depressive disorder, single episode, unspecified: Secondary | ICD-10-CM

## 2014-11-16 DIAGNOSIS — F32A Depression, unspecified: Secondary | ICD-10-CM

## 2014-11-16 NOTE — Telephone Encounter (Signed)
Pt called to request an appointment today for a sinus infection. There was no available "same day" slots for the day, I offered to put her in for tomorrow morning. She stated that tomorrow would not work so I suggested that she go to Urgent Care. She asked for her PCP to return her call, when I asked about the specific reason for the phone call, she states that she wants to discuss her anti-depressant medication with him. Sadie Reynolds, ASA °

## 2014-11-16 NOTE — Telephone Encounter (Signed)
Pt called to request an appointment today for a sinus infection. There was no available "same day" slots for the day, I offered to put her in for tomorrow morning. She stated that tomorrow would not work so I suggested that she go to Urgent Care. She asked for her PCP to return her call, when I asked about the specific reason for the phone call, she states that she wants to discuss her anti-depressant medication with him. Sierra Schneider, ASA

## 2014-11-17 ENCOUNTER — Other Ambulatory Visit: Payer: BLUE CROSS/BLUE SHIELD

## 2014-11-18 MED ORDER — DULOXETINE HCL 30 MG PO CPEP: 30.0000 mg | ORAL_CAPSULE | Freq: Every day | ORAL | Status: DC

## 2014-11-18 NOTE — Telephone Encounter (Signed)
Spoke with patient about her depression. Has been working better than the rest (previous medications) but she is still having changes in her mood that her boyfriend has noticed ans he had a crying spell from being overwhelmed yesterday. She does has some anxiety associated with her depression.   Discussed with Dr. Raymondo Band and advised increasing Cymbalta to 90 mg. Would placed her in the treatment refractory since failing previous medications. Next step would be a newer anti-depressant such as Vybrid. May try Abilify but would consider the new anti-depressant agents first.   Myra Rude, MD PGY-3, [facility] 11/18/2014, 11:14 AM

## 2014-11-24 ENCOUNTER — Ambulatory Visit: Payer: BLUE CROSS/BLUE SHIELD | Admitting: Endocrinology

## 2014-12-10 ENCOUNTER — Other Ambulatory Visit: Payer: Self-pay | Admitting: Family Medicine

## 2014-12-15 ENCOUNTER — Other Ambulatory Visit: Payer: Self-pay | Admitting: Family Medicine

## 2014-12-15 DIAGNOSIS — F329 Major depressive disorder, single episode, unspecified: Secondary | ICD-10-CM

## 2014-12-15 DIAGNOSIS — F32A Depression, unspecified: Secondary | ICD-10-CM

## 2014-12-15 MED ORDER — DULOXETINE HCL 30 MG PO CPEP: 30.0000 mg | ORAL_CAPSULE | Freq: Every day | ORAL | Status: DC

## 2014-12-15 MED ORDER — DULOXETINE HCL 60 MG PO CPEP: 60.0000 mg | ORAL_CAPSULE | Freq: Every day | ORAL | Status: DC

## 2014-12-15 NOTE — Telephone Encounter (Signed)
Patient requesting a updated refill for Cymbalta 90 mg (increase in dosage) Please advise.

## 2015-01-07 ENCOUNTER — Other Ambulatory Visit: Payer: Self-pay | Admitting: Endocrinology

## 2015-01-11 ENCOUNTER — Encounter: Payer: Self-pay | Admitting: Endocrinology

## 2015-01-11 ENCOUNTER — Ambulatory Visit (INDEPENDENT_AMBULATORY_CARE_PROVIDER_SITE_OTHER): Payer: BLUE CROSS/BLUE SHIELD | Admitting: Endocrinology

## 2015-01-11 VITALS — BP 118/78 | HR 103 | Temp 98.6°F | Resp 14 | Ht 64.0 in | Wt 154.4 lb

## 2015-01-11 DIAGNOSIS — E1065 Type 1 diabetes mellitus with hyperglycemia: Secondary | ICD-10-CM

## 2015-01-11 DIAGNOSIS — Z23 Encounter for immunization: Secondary | ICD-10-CM

## 2015-01-11 LAB — POCT GLYCOSYLATED HEMOGLOBIN (HGB A1C): Hemoglobin A1C: 7.4

## 2015-01-11 NOTE — Patient Instructions (Signed)
Check sugar 4x daily 

## 2015-01-11 NOTE — Progress Notes (Signed)
Patient ID: Sierra Schneider, female   DOB: 1985/04/20, 29 y.o.   MRN: [id number]   Reason for Appointment: Diabetes followup:   History of Present Illness   Diagnosis: Type 1 DIABETES MELITUS     DIABETES history: She has had diabetes since about 2011 and has been on insulin pump for at least 2 years She had fairly good control in the first 2 years of her disease but blood sugar has been more difficult in the last year at least  CURRENT insulin pump:  One Touch Ping  SETTINGS are: Basal rate: Midnight =1.0 , 4 AM = 1.1, 9 AM = 0.55, 12 noon = 1.2 , 6 PM = 0.9 and 7 PM = 0.45.   Total basal insulin = 20.5units Carb Ratio: 1:10. Correction factor 1: 30, target 100 BOLUS insulin  = 48.6 units daily  On her last visit she was tending to get some hypoglycemia, after meals and some before lunch Her carbohydrate coverage was changed to 1: Tendon her basal rate was reduced at 9 AM Also now her basal rate is reading higher at midnight compared to the last visit although she does not usually change basal rates herself  Even though she has not monitored her blood sugar lately her A1c is better than usual at 7.4   Current blood sugar patterns and problems identified:   She  is not checking her blood sugars much at all and has readings only on 2 days on her download including one day when she was having hypoglycemia at 1 AM and 7 PM  She does not think she has excessive or frequent hypoglycemia, less than once a week  She is bolusing arbitrarily based on what she is eating which frequently  includes high-fat meals  Her bolus insulin is about 70% of her total daily insulin  She is not motivated to check her blood sugar and also is using expired test strips at this time  She is still not using any extended boluses despite advising her on covering high-fat meals, this may be causing higher readings overnight on some days  She is concerned about her weight gain  Although previously  she was exercising she has done this yet rarely now  Pump download indicates that she is taking about 5 boluses per day on average   Wt Readings from Last 3 Encounters:  01/11/15 154 lb 6.4 oz (70.035 kg)  10/22/14 150 lb 4.8 oz (68.176 kg)  09/22/14 153 lb (69.4 kg)     LABS:  Lab Results  Component Value Date   HGBA1C 7.4 01/11/2015   HGBA1C 8.3 07/05/2014   HGBA1C 8.2* 03/13/2014   Lab Results  Component Value Date   MICROALBUR 0.2 09/28/2013   LDLCALC 112* 03/13/2014   CREATININE 0.60 03/13/2014        Medication List       This list is accurate as of: 01/11/15  8:54 PM.  Always use your most recent med list.               cetirizine 10 MG tablet  Commonly known as:  ZYRTEC  Take 1 tablet (10 mg total) by mouth daily.     DULoxetine 30 MG capsule  Commonly known as:  CYMBALTA  Take 1 capsule (30 mg total) by mouth daily. To be taken with prescription of 60 mg of Cymbalta for total of 90 mg daily.     DULoxetine 60 MG capsule  Commonly known as:  CYMBALTA  Take 1 capsule (60 mg total) by mouth daily.     fluticasone 50 MCG/ACT nasal spray  Commonly known as:  FLONASE  Place 2 sprays into both nostrils daily.     insulin regular 100 units/mL injection  Commonly known as:  NOVOLIN R,HUMULIN R  Inject 100 Units into the skin at bedtime. With insulin pump     NOVOLOG 100 UNIT/ML injection  Generic drug:  insulin aspart  INJECT 65 UNITS SUBCUTANEOUSLY ONCE DAILY IN  INSULIN  PUMP        Allergies: No Known Allergies  Past Medical History  Diagnosis Date  . Diabetes mellitus 08/2009    late onset type 1    Past Surgical History  Procedure Laterality Date  . Cesarean section    . No past surgeries    . Esophagogastroduodenoscopy Left 10/14/2012    Procedure: ESOPHAGOGASTRODUODENOSCOPY (EGD);  Surgeon: Vertell Novak., MD;  Location: [facility] ENDOSCOPY;  Service: Endoscopy;  Laterality: Left;    Family History  Problem Relation Age of Onset    . Depression Mother   . Depression Father   . Kidney disease Paternal Grandmother     Social History:  reports that she quit smoking about a year ago. She has never used smokeless tobacco. She reports that she drinks alcohol. She reports that she does not use illicit drugs.  REVIEW of systems:    She has had mild hypercholesterolemia. She has a relatively high fat diet   Lab Results  Component Value Date   CHOL 189 03/13/2014   HDL 54 03/13/2014   LDLCALC 112* 03/13/2014   LDLDIRECT 129.4 12/29/2012   TRIG 113 03/13/2014   CHOLHDL 3.5 03/13/2014      She has been taking  Cymbalta for depression with good control  EXAM:  BP 118/78 mmHg  Pulse 103  Temp(Src) 98.6 F (37 C)  Resp 14  Ht  (1.626 m)  Wt 154 lb 6.4 oz (70.035 kg)  BMI 26.49 kg/m2  SpO2 97%   ASSESSMENT:    See history of present illness for detailed discussion of his current management, blood sugar patterns and problems identified Unable to review her blood sugar patterns on this visit because of lack of glucose monitoring She has not been motivated to monitor her blood sugars except when she feels a little hypoglycemic which has been infrequent Also using expired test strips Currently using somewhat arbitrary boluses without actually carbohydrate counting and not entering any carbohydrates in her pump. She is concerned about weight gain but does not really follow any diet and has not exercised recently   Recommendations:    She will not change her basal rates as yet  She will call if she has any excessive hypoglycemia especially overnight  Consultation with nutritionist for meal planning which would include carbohydrate counting, advice on low fat meals and weight loss; she can benefit from lower saturated fat diet also  Start using unexpired test strips and check blood sugars at least 4 times a day for the 2 weeks prior to the next visit  Regular follow-up  Patient Instructions  Check  sugar 4x daily     Counseling time on subjects discussed above is over 50% of today's 25 minute visit Influenza vaccine given   [facility] 01/11/2015, 8:54 PM

## 2015-01-23 ENCOUNTER — Other Ambulatory Visit: Payer: Self-pay | Admitting: Endocrinology

## 2015-01-24 ENCOUNTER — Telehealth: Payer: Self-pay | Admitting: Endocrinology

## 2015-01-24 NOTE — Telephone Encounter (Signed)
rx sent

## 2015-01-24 NOTE — Telephone Encounter (Signed)
Patient Name: Sierra FloodMELINDA Feltz Gender: Female DOB: 05-20-1985 Age: 29 Y 10 M 5 D Return Phone Number: [phone] (Primary) Address: City/State/Zip: [place] [facility] Night - Client Client Site  Endocrinology Physician Reather LittlerKumar, Ajay Contact Type Call Call Type Triage / Clinical Relationship To Patient Self Return Phone Number [phone] (Primary) Chief Complaint Prescription Refill or Medication Request (non symptomatic) Initial Comment Caller States she needs refill on insulin. will be out of her insulin today. needs refill called in. Nurse Assessment Nurse: Annye Englisharmon, RN, Angelique Blonderenise Date/Time (Eastern Time): 01/23/2015 10:22:35 AM Please select the assessment type ---Refill Additional Documentation ---Pt seen by MD 2 wks ago and was to send Rx for Novalog insulin for pump, but the pharm does not have the Rx. Needs refill on Novalog insulin, take as dir sq per pump, max 50u sq per day, #1 vial. Allergies: NKDA [facility] [address], [phone] Does the patient have enough medication to last until the office opens? ---No Additional Documentation ---Advised will call 1 vial of insulin to the pharmacy and will need to call the MDO in the am during reg bus hrs for the needed refill. Guidelines Guideline Title Affirmed Question Affirmed Notes Nurse Date/Time Lamount Cohen(Eastern

## 2015-02-03 ENCOUNTER — Telehealth: Payer: Self-pay | Admitting: Endocrinology

## 2015-02-03 NOTE — Telephone Encounter (Signed)
Pt needs us to call in [facility] to [facility]

## 2015-02-03 NOTE — Telephone Encounter (Signed)
Dr. Lucianne MussKumar sent in the prescription on 11/28 with 3 refills, receipt confirmed by pharmacy at 9:10 am.  I called the pharmacy and left refill information on their answering machine.

## 2015-02-10 ENCOUNTER — Ambulatory Visit: Payer: BLUE CROSS/BLUE SHIELD | Admitting: *Deleted

## 2015-02-11 ENCOUNTER — Encounter: Payer: BLUE CROSS/BLUE SHIELD | Attending: Endocrinology | Admitting: Dietician

## 2015-02-11 ENCOUNTER — Encounter: Payer: Self-pay | Admitting: Dietician

## 2015-02-11 ENCOUNTER — Ambulatory Visit: Payer: BLUE CROSS/BLUE SHIELD | Admitting: *Deleted

## 2015-02-11 VITALS — Ht 64.0 in | Wt 160.0 lb

## 2015-02-11 DIAGNOSIS — Z713 Dietary counseling and surveillance: Secondary | ICD-10-CM | POA: Diagnosis not present

## 2015-02-11 DIAGNOSIS — E109 Type 1 diabetes mellitus without complications: Secondary | ICD-10-CM | POA: Insufficient documentation

## 2015-02-11 DIAGNOSIS — E1065 Type 1 diabetes mellitus with hyperglycemia: Secondary | ICD-10-CM

## 2015-02-11 DIAGNOSIS — IMO0001 Reserved for inherently not codable concepts without codable children: Secondary | ICD-10-CM

## 2015-02-11 NOTE — Progress Notes (Signed)
Diabetes Self-Management Education  Visit Type: First/Initial  Appt. Start Time: 1045 Appt. End Time: 1200  02/11/2015  [name], identified by name and date of birth, is a 29 y.o. female with a diagnosis of Diabetes: Type 1 since 2011.  She uses a pump.  She has been guessing her insulin boluses as she has not been checking her blood sugar.  "It is a shot in the dark".  States that she quit smoking 1 year ago and now is frustrated due to 30 lb weight gain since that time.  Lowest Adult weight 118 lbs.  Highest adult weight is current weight of 160 lbs.  She reports depression since she was a child which was well controlled on Zoloft.  This stopped working and she has started on Cymbalta which she feels is not working as well.    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  She lives with her fiance and 2 children ages 10 and 70.  She does not have much time for exercise but has started to incorporate it a couple of times per week while waiting on children in activities.  She has a membership to a gym and uses the treatmill there.  She is more motivated to go when other people are involved. Both shop and cook and patient states that fiance and children are picky and that there diet is unhealthy.  They eat out and cook at home.    She would like to learn how to lose weight and work on Education administrator.  Referral for carbohydrate counting, weight loss, low fat/saturated fat foods and meal planning.  Lezlee feels that she knows carb counting well at this time.    ASSESSMENT  Height  (1.626 m), weight 160 lb (72.576 kg). Body mass index is 27.45 kg/(m^2).      Diabetes Self-Management Education - 02/11/15 1056    Visit Information   Visit Type First/Initial   Initial Visit   Diabetes Type Type 1   Are you currently following a meal plan? No   Are you taking your medications as prescribed? Yes   Date Diagnosed 2011   Health Coping   How would you rate your overall  health? Fair   Psychosocial Assessment   Patient Belief/Attitude about Diabetes Other (comment)  overwhelmed   Self-care barriers Other (comment)  lack of time alloted to care for self   Self-management support Doctor's office;Family;Friends;CDE visits   Other persons present Patient   Patient Concerns Weight Control;Nutrition/Meal planning   Special Needs None   Preferred Learning Style No preference indicated   Learning Readiness Ready   How often do you need to have someone help you when you read instructions, pamphlets, or other written materials from your doctor or pharmacy? 1 - Never   What is the last grade level you completed in school? 2 years college   Complications   Last HgB A1C per patient/outside source 7.4 %  01/11/15   How often do you check your blood sugar? 0 times/day (not testing)  checks when low   Number of hypoglycemic episodes per month 0   Number of hyperglycemic episodes per week 0   Have you had a dilated eye exam in the past 12 months? Yes   Have you had a dental exam in the past 12 months? Yes   Are you checking your feet? Yes   How many days per week are you checking your feet? 7   Dietary  Intake   Breakfast sausage biscuit and hashbrowns ([employer]) OR skips due to time   Snack (morning) NABS (peanut butter and crackers)  10   Lunch ham sandwich, chips, fruit OR [employer]'s chicken nuggets and side salad OR [employer] value meal (burger and fries)  12:00   Snack (afternoon) none   Dinner pizza, hamburger casserole, chicken casserole, spaghetti, chicken sandwiches, hamburger helper with corn and green beans  6:00-7:00   Snack (evening) 1-2 cookies,   (middle of the night occasional cookie and beverage)  after dinner   Beverage(s) diet Mt. Dew, water, coffee with cream and splenda   Exercise   Exercise Type Light (walking / raking leaves)   How many days per week to you exercise? 1   How many minutes per day do you exercise? 60   Total minutes per week  of exercise 60   Patient Education   Previous Diabetes Education Yes (please comment)  since diagnosis 2011   Disease state  Explored patient's options for treatment of their diabetes   Nutrition management  Food label reading, portion sizes and measuring food.;Information on hints to eating out and maintain blood glucose control.;Meal options for control of blood glucose level and chronic complications.   Physical activity and exercise  Role of exercise on diabetes management, blood pressure control and cardiac health.   Monitoring Purpose and frequency of SMBG.   Psychosocial adjustment Worked with patient to identify barriers to care and solutions;Role of stress on diabetes   Personal strategies to promote health Lifestyle issues that need to be addressed for better diabetes care   Individualized Goals (developed by patient)   Nutrition General guidelines for healthy choices and portions discussed;Other (comment)  carb count   Physical Activity Exercise 5-7 days per week   Medications take my medication as prescribed   Monitoring  test my blood glucose as discussed   Problem Solving making habits to check blood sugar and begin exercise, eat healthfully, and meal planning   Reducing Risk Other (comment)  check blood sugar   Health Coping ask for help with (comment)  other tasts to make time for self care   Outcomes   Expected Outcomes Demonstrated interest in learning. Expect positive outcomes   Future DMSE 2 months   Program Status Completed   Subsequent Visit   Since your last visit have you continued or begun to take your medications as prescribed? Yes   Since your last visit have you had your blood pressure checked? No   Since your last visit have you experienced any weight changes? Gain   Weight Gain (lbs) 30  in the past year   Since your last visit, are you checking your blood glucose at least once a day? No      Individualized Plan for Diabetes Self-Management Training:    Learning Objective:  Patient will have a greater understanding of diabetes self-management. Patient education plan is to attend individual and/or group sessions per assessed needs and concerns.   Plan:   Patient Instructions  What things can you simplify in your life to better care for you? Get back in the habit of checking your blood sugar.   Aim for breakfast daily. Practice mindful eating.  Eat slowly.    Am I eating because I am hungry or for some other reason?  When am I full? Choose baked rather than fried What are healthier food choices when eating out? How can I modify my recipes to make healthier meals? Aim for  30 minutes of exercise most days of the week.   Consider Omega-3 sources or supplement (tuna, salmon, walnuts, ground flax seeds, chia seeds) Take your Vitamin D supplement daily.   Expected Outcomes:  Demonstrated interest in learning. Expect positive outcomes  Education material provided: Food label handouts, Meal plan card, My Plate and Snack sheet, phone app list, weight loss tips, sample meal plans for menu ideas only from [employer]bechoosemyplate.[employer].    If problems or questions, patient to contact team via:  Phone and Email  Future DSME appointment: 2 months

## 2015-02-11 NOTE — Patient Instructions (Signed)
What things can you simplify in your life to better care for you? Get back in the habit of checking your blood sugar.   Aim for breakfast daily. Practice mindful eating.  Eat slowly.    Am I eating because I am hungry or for some other reason?  When am I full? Choose baked rather than fried What are healthier food choices when eating out? How can I modify my recipes to make healthier meals? Aim for 30 minutes of exercise most days of the week.   Consider Omega-3 sources or supplement (tuna, salmon, walnuts, ground flax seeds, chia seeds) Take your Vitamin D supplement daily.

## 2015-03-11 ENCOUNTER — Ambulatory Visit: Payer: BLUE CROSS/BLUE SHIELD | Admitting: Family Medicine

## 2015-04-04 ENCOUNTER — Other Ambulatory Visit: Payer: Self-pay | Admitting: *Deleted

## 2015-04-05 NOTE — Telephone Encounter (Signed)
2nd request.  Dniyah Grant L, RN  

## 2015-04-06 MED ORDER — DULOXETINE HCL 60 MG PO CPEP: 60.0000 mg | ORAL_CAPSULE | Freq: Every day | ORAL | Status: DC

## 2015-04-06 MED ORDER — FLUTICASONE PROPIONATE 50 MCG/ACT NA SUSP: 2.0000 | Freq: Every day | NASAL | Status: DC

## 2015-04-07 ENCOUNTER — Encounter: Payer: Self-pay | Admitting: Dietician

## 2015-04-07 ENCOUNTER — Encounter: Payer: BLUE CROSS/BLUE SHIELD | Attending: Endocrinology | Admitting: Dietician

## 2015-04-07 ENCOUNTER — Other Ambulatory Visit (INDEPENDENT_AMBULATORY_CARE_PROVIDER_SITE_OTHER): Payer: BLUE CROSS/BLUE SHIELD

## 2015-04-07 VITALS — Wt 155.0 lb

## 2015-04-07 DIAGNOSIS — E109 Type 1 diabetes mellitus without complications: Secondary | ICD-10-CM | POA: Insufficient documentation

## 2015-04-07 DIAGNOSIS — E108 Type 1 diabetes mellitus with unspecified complications: Secondary | ICD-10-CM

## 2015-04-07 DIAGNOSIS — E1065 Type 1 diabetes mellitus with hyperglycemia: Secondary | ICD-10-CM

## 2015-04-07 DIAGNOSIS — Z713 Dietary counseling and surveillance: Secondary | ICD-10-CM | POA: Insufficient documentation

## 2015-04-07 LAB — LIPID PANEL
CHOL/HDL RATIO: 5
Cholesterol: 215 mg/dL — ABNORMAL HIGH (ref 0–200)
HDL: 46.1 mg/dL (ref >39.00)
LDL CALC: 140 mg/dL — AB (ref 0–99)
NonHDL: 168.95
TRIGLYCERIDES: 145 mg/dL (ref 0.0–149.0)
VLDL: 29 mg/dL (ref 0.0–40.0)

## 2015-04-07 LAB — BASIC METABOLIC PANEL
BUN: 10 mg/dL (ref 6–23)
CHLORIDE: 102 meq/L (ref 96–112)
CO2: 30 meq/L (ref 19–32)
Calcium: 9.2 mg/dL (ref 8.4–10.5)
Creatinine, Ser: 0.64 mg/dL (ref 0.40–1.20)
GFR: 115 mL/min (ref >60.00)
Glucose, Bld: 241 mg/dL — ABNORMAL HIGH (ref 70–99)
POTASSIUM: 3.9 meq/L (ref 3.5–5.1)
SODIUM: 138 meq/L (ref 135–145)

## 2015-04-07 LAB — MICROALBUMIN / CREATININE URINE RATIO
Creatinine,U: 208 mg/dL
Microalb Creat Ratio: 0.7 mg/g (ref 0.0–30.0)
Microalb, Ur: 1.4 mg/dL (ref 0.0–1.9)

## 2015-04-07 LAB — HEMOGLOBIN A1C: HEMOGLOBIN A1C: 7.6 % — AB (ref 4.6–6.5)

## 2015-04-07 NOTE — Patient Instructions (Signed)
Great job on the changes that you have made. Consider adding omega 3 foods to your diet. (salmon, tuna, walnuts, ground flax seeds, chia seeds) Aim for a snack in the afternoon prior to your low.  Discuss with MD about changes to your regime. Continue the exercise habit, healthier snacks, and mindful eating. Continue to check blood sugar regularly. Add more non starchy vegetables (the steamables that you mentioned is a great choice).

## 2015-04-07 NOTE — Progress Notes (Signed)
Diabetes Self-Management Education  Visit Type:  Follow-up  Appt. Start Time: 0845 Appt. End Time: 0915  04/07/2015  Ms. Shade Flood, identified by name and date of birth, is a 30 y.o. female with a diagnosis of Diabetes: Type 1.  Visit 02/11/16.  At that time, patient was guessing her insulin boluses as she ws not checking her blood sugar.  She says that she has improved in this area but still "not enough".  She has lost 5 lbs and reports that she has decreased her portion sizes and decreased snacking and is now snacking on greek yogurt and granola.  She often will get a side salad rather than fries when she eats out for lunch.  She has begun doing 100 sit ups each night.  She still struggles with finding time for self care and easy meal prep.  She has considered [employer] or other company to make meal prep easier without eating out.  She has begun being low in the afternoon since making the changes to her diet.  Her goal weight is 140 lbs.  States that she quit smoking 1 year ago and now is frustrated due to 30 lb weight gain since that time. Lowest Adult weight 118 lbs. Highest adult weight is current weight of 160 lbs. She reports depression since she was a child which was well controlled on Zoloft. This stopped working and she has started on Cymbalta which she feels is not working as well.   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 She lives with her fiance and 2 children ages 66 and 35. She does not have much time for exercise but has started to incorporate it a couple of times per week while waiting on children in activities. She has a membership to a gym and uses the treatmill there. She is more motivated to go when other people are involved. Both shop and cook and patient states that fiance and children are picky and that there diet is unhealthy. They eat out and cook at home.   She would like to learn how to lose weight and work on Education administrator. Referral for  carbohydrate counting, weight loss, low fat/saturated fat foods and meal planning. [place] feels that she knows carb counting well at this time.   ASSESSMENT  There were no vitals taken for this visit. There is no weight on file to calculate BMI.       Diabetes Self-Management Education - 04/07/15 1625    Psychosocial Assessment   Patient Belief/Attitude about Diabetes Other (comment)  Overwhelmed   Self-care barriers Other (comment)  lack of time   Self-management support Doctor's office;CDE visits;Family;Friends   Patient Concerns Nutrition/Meal planning;Weight Control   Special Needs None   Preferred Learning Style No preference indicated   Learning Readiness Ready   Complications   Last HgB A1C per patient/outside source 7.4 %  02/11/15   Number of hypoglycemic episodes per month 16   Can you tell when your blood sugar is low? Yes   What do you do if your blood sugar is low? eat   Have you had a dental exam in the past 12 months? Yes   Are you checking your feet? Yes   How many days per week are you checking your feet? 7   Exercise   Exercise Type Light (walking / raking leaves);Other (comment)  100 sit ups most nights   How many days per week to you exercise? 7   How  many minutes per day do you exercise? 15   Total minutes per week of exercise 105   Patient Education   Previous Diabetes Education Yes (please comment)   Nutrition management  Carbohydrate counting;Meal options for control of blood glucose level and chronic complications.;Information on hints to eating out and maintain blood glucose control.   Physical activity and exercise  Role of exercise on diabetes management, blood pressure control and cardiac health.   Psychosocial adjustment Worked with patient to identify barriers to care and solutions;Role of stress on diabetes   Individualized Goals (developed by patient)   Nutrition General guidelines for healthy choices and portions discussed;Follow meal plan  discussed   Physical Activity Exercise 5-7 days per week;30 minutes per day   Medications take my medication as prescribed   Monitoring  test my blood glucose as discussed   Problem Solving making habits to check blood sugar and begin exercise, eating healthfully, and meal planning   Reducing Risk examine blood glucose patterns;Other (comment)  check blood sugar   Health Coping ask for help with (comment)  other tasks to help make time for self care   Patient Self-Evaluation of Goals - Patient rates self as meeting previously set goals (% of time)   Nutrition 50 - 75 %   Physical Activity 25 - 50%   Medications >75%   Monitoring 50 - 75 %   Problem Solving 50 - 75 %   Reducing Risk 50 - 75 %   Health Coping 50 - 75 %   Outcomes   Program Status Completed   Subsequent Visit   Since your last visit have you continued or begun to take your medications as prescribed? Yes   Since your last visit have you had your blood pressure checked? No   Since your last visit have you experienced any weight changes? Loss   Weight Loss (lbs) 5   Since your last visit, are you checking your blood glucose at least once a day? Yes      Learning Objective:  Patient will have a greater understanding of diabetes self-management. Patient education plan is to attend individual and/or group sessions per assessed needs and concerns.   Plan:   Patient Instructions  Randie [employer] job on the changes that you have made. Consider adding omega 3 foods to your diet. (salmon, tuna, walnuts, ground flax seeds, chia seeds) Aim for a snack in the afternoon prior to your low.  Discuss with MD about changes to your regime. Continue the exercise habit, healthier snacks, and mindful eating. Continue to check blood sugar regularly. Add more non starchy vegetables (the steamables that you mentioned is a great choice).    Expected Outcomes:  Demonstrated interest in learning. Expect positive outcomes  Education material  provided:   If problems or questions, patient to contact team via:  Phone and Email  Future DSME appointment: - PRN

## 2015-04-13 ENCOUNTER — Encounter: Payer: Self-pay | Admitting: Endocrinology

## 2015-04-13 ENCOUNTER — Ambulatory Visit (INDEPENDENT_AMBULATORY_CARE_PROVIDER_SITE_OTHER): Payer: BLUE CROSS/BLUE SHIELD | Admitting: Endocrinology

## 2015-04-13 ENCOUNTER — Ambulatory Visit: Payer: BLUE CROSS/BLUE SHIELD | Admitting: Endocrinology

## 2015-04-13 VITALS — BP 110/74 | HR 118 | Temp 97.9°F | Resp 14 | Ht 64.0 in | Wt 157.2 lb

## 2015-04-13 DIAGNOSIS — E7800 Pure hypercholesterolemia, unspecified: Secondary | ICD-10-CM

## 2015-04-13 DIAGNOSIS — E1065 Type 1 diabetes mellitus with hyperglycemia: Secondary | ICD-10-CM

## 2015-04-13 DIAGNOSIS — R000 Tachycardia, unspecified: Secondary | ICD-10-CM

## 2015-04-13 LAB — T4, FREE: FREE T4: 0.87 ng/dL (ref 0.60–1.60)

## 2015-04-13 LAB — TSH: TSH: 0.88 u[IU]/mL (ref 0.35–4.50)

## 2015-04-13 MED ORDER — PRAVASTATIN SODIUM 40 MG PO TABS: 40.0000 mg | ORAL_TABLET | Freq: Every day | ORAL | Status: DC

## 2015-04-13 NOTE — Patient Instructions (Signed)
See copy of settings

## 2015-04-13 NOTE — Progress Notes (Signed)
Patient ID: Sierra Schneider, female   DOB: 07/29/1985, 30 y.o.   MRN: [id number]   Reason for Appointment: Diabetes followup:   History of Present Illness   Diagnosis: Type 1 DIABETES MELITUS     DIABETES history: She has had diabetes since about 2011 and has been on insulin pump for at least 2 years She had fairly good control in the first 2 years of her disease but blood sugar has been more difficult in the last year at least  CURRENT insulin pump:  One Touch Ping  SETTINGS are:   Basal rate: Midnight =1.0 , 4 AM = 1.1, 9 AM = 0.55, 12 noon = 1.2 , 6 PM = 0.9 and 7 PM = 0.45.   Total basal insulin = 20.5units Carb Ratio: 1:10. Correction factor 1: 30, target 100 Total insulin  = 59 units with 65% bolus  On her last visit she was not checking her blood sugars and had expired test strips Her A1c is still about the same at 7.6   Current blood sugar patterns and problems identified:   She  gets variable FASTING blood sugars which are sometimes fairly high and only one good readings  Blood sugars the rest of the day are variable, highest 2 readings are around lunchtime and rarely in the afternoon  Last 2 readings around lunchtime are fairly good and suppertime readings are not usually high  Not checking readings after evening meal  Although she thinks she is trying to cut back on the fat intake for some time she has started more consistently after seeing the dietitian this week  Still not losing weight  She is still not using any extended boluses despite advising her on covering high-fat meals, she is eating higher fat meals mostly in the evenings  She is not doing any exercises except pushups  Pump download indicates that she is taking about 4-5 boluses per day on average  Mean values apply above for all meters except median for One Touch  PRE-MEAL Fasting Lunch Dinner Bedtime Overall  Glucose range: 121-244  71-259   68-233     Mean/median:      172+/-72     POST-MEAL PC Breakfast PC Lunch PC Dinner  Glucose range:  71, 251   165, 345, 105  ?    Mean/median:        Wt Readings from Last 3 Encounters:  04/13/15 157 lb 3.2 oz (71.305 kg)  04/07/15 155 lb (70.308 kg)  02/11/15 160 lb (72.576 kg)     LABS:  Lab Results  Component Value Date   HGBA1C 7.6* 04/07/2015   HGBA1C 7.4 01/11/2015   HGBA1C 8.3 07/05/2014   Lab Results  Component Value Date   MICROALBUR 1.4 04/07/2015   LDLCALC 140* 04/07/2015   CREATININE 0.64 04/07/2015        Medication List       This list is accurate as of: 04/13/15  9:14 AM.  Always use your most recent med list.               cetirizine 10 MG tablet  Commonly known as:  ZYRTEC  Take 1 tablet (10 mg total) by mouth daily.     DULoxetine 30 MG capsule  Commonly known as:  CYMBALTA  Take 1 capsule (30 mg total) by mouth daily. To be taken with prescription of 60 mg of Cymbalta for total of 90 mg daily.     DULoxetine 60 MG capsule  Commonly known as:  CYMBALTA  Take 1 capsule (60 mg total) by mouth daily.     fluticasone 50 MCG/ACT nasal spray  Commonly known as:  FLONASE  Place 2 sprays into both nostrils daily.     insulin regular 100 units/mL injection  Commonly known as:  NOVOLIN R,HUMULIN R  Inject 100 Units into the skin at bedtime. Reported on 04/13/2015     NOVOLOG 100 UNIT/ML injection  Generic drug:  insulin aspart  INJECT 65 UNITS SUBCUTANEOUSLY ONCE DAILY IN INSULIN PUMP        Allergies: No Known Allergies  Past Medical History  Diagnosis Date  . Diabetes mellitus 08/2009    late onset type 1    Past Surgical History  Procedure Laterality Date  . Cesarean section    . No past surgeries    . Esophagogastroduodenoscopy Left 10/14/2012    Procedure: ESOPHAGOGASTRODUODENOSCOPY (EGD);  Surgeon: Vertell Novak., MD;  Location: [facility] ENDOSCOPY;  Service: Endoscopy;  Laterality: Left;    Family History  Problem Relation Age of Onset  . Depression Mother    . Depression Father   . Kidney disease Paternal Grandmother     Social History:  reports that she quit smoking about 14 months ago. She has never used smokeless tobacco. She reports that she drinks alcohol. She reports that she does not use illicit drugs.  REVIEW of systems:  Today she appears to be anxious and she thinks that she has been more throwing the last 2 months Also in the last year has been getting more hot and sweaty. Occasionally will feel shaky Not complaining about palpitation but her pulse is unusually fast  She has had mild hypercholesterolemia, however LDL is up to 140 now She thinks she has been cutting back on high-fat meats over the last couple of months Father has hypercholesterolemia   Lab Results  Component Value Date   CHOL 215* 04/07/2015   CHOL 189 03/13/2014   CHOL 208* 12/29/2012   Lab Results  Component Value Date   HDL 46.10 04/07/2015   HDL 54 03/13/2014   HDL 60.50 12/29/2012   Lab Results  Component Value Date   LDLCALC 140* 04/07/2015   LDLCALC 112* 03/13/2014   Lab Results  Component Value Date   TRIG 145.0 04/07/2015   TRIG 113 03/13/2014   TRIG 65.0 12/29/2012   Lab Results  Component Value Date   CHOLHDL 5 04/07/2015   CHOLHDL 3.5 03/13/2014   CHOLHDL 3 12/29/2012   Lab Results  Component Value Date   LDLDIRECT 129.4 12/29/2012       She has been taking  Cymbalta 90 mg for depression with good control  EXAM:  BP 110/74 mmHg  Pulse 118  Temp(Src) 97.9 F (36.6 C)  Resp 14  Ht  (1.626 m)  Wt 157 lb 3.2 oz (71.305 kg)  BMI 26.97 kg/m2  SpO2 95%  Thyroid not palpable. Minimal tremor present. Hands are warm and moist Reflexes appear normal  ASSESSMENT:    See history of present illness for detailed discussion of his current management, blood sugar patterns and problems identified Her blood sugar control is fair with sporadic high readings partly related to her diet  No consistent pattern is seen She  is trying to be better with her diet and cutting out high-fat lunches Although she has checked her sugars more than usual still not checking enough and does need to check blood sugars at every meal No postprandial readings  after supper    Recommendations:    She will increase her basal rate at 6 AM to 1.2 since fasting readings are mostly high  More readings after supper and may need to adjust carbohydrate coverage if needed  Extra insulin for higher fat meals in the evenings and extended bolus up to 4 hours  Regular exercise program  Continue reducing fat intake and diet  She will look into the new Medtronic technology for insulin pumps, discussed briefly how this would be beneficial when the  closed loop system is available  Check thyroid functions  There are no Patient Instructions on file for this visit.   Counseling time on subjects discussed above is over 50% of today's 25 minute visit   Chadwick Reiswig 04/13/2015, 9:14 AM

## 2015-04-27 ENCOUNTER — Encounter: Payer: BLUE CROSS/BLUE SHIELD | Attending: Endocrinology | Admitting: Nutrition

## 2015-04-27 DIAGNOSIS — E1065 Type 1 diabetes mellitus with hyperglycemia: Secondary | ICD-10-CM

## 2015-04-27 DIAGNOSIS — Z713 Dietary counseling and surveillance: Secondary | ICD-10-CM | POA: Insufficient documentation

## 2015-04-27 DIAGNOSIS — E109 Type 1 diabetes mellitus without complications: Secondary | ICD-10-CM | POA: Insufficient documentation

## 2015-05-03 ENCOUNTER — Encounter: Payer: BLUE CROSS/BLUE SHIELD | Admitting: Nutrition

## 2015-05-03 DIAGNOSIS — E1065 Type 1 diabetes mellitus with hyperglycemia: Secondary | ICD-10-CM

## 2015-05-03 NOTE — Patient Instructions (Signed)
Read over the books on Pump instruction:  Books P and book 1. Call if questions

## 2015-05-03 NOTE — Progress Notes (Signed)
Juliette AlcideMelinda got her Medtronic pump in the mail, and was feeling very overwhelmed from all of the boxes and manuals.  We discussed that she does not need to learn everything now, and will not even begin the sensor for 2-3 weeks after starting the pump.  She will let me know when she feels comfortable with this pump before we proceed with the sensor. She felt better about this, and we reviewed some features on this new pump that she does not have on her Animas pump.  She said that will will read over books P and 1, although she knows most of this.  But I think that it will make her feel more comfortable when she starts this next week.   She had no final questions.

## 2015-05-03 NOTE — Progress Notes (Signed)
Patient was trained on how to fill a cartridge and give the boluses on the new Medtronic pump.   Settings were put into the pump by the patient:: Basal rate: MN: 1.0, 4AM: 1.1, 6AM: 1.2, 10AM: .55, 12PM: 1.2, 6PM: .9, 7PM: 0.45 I/C ratio: 10, ISF: 30, target: 90-110, timing 4 hours.   We hooked up her Contour Next meter to send the readings to her pump.  She re demonstrated how to do a reading, and give a correction bolus at that time correctly. She filled a cartridge and inserted a mio 6MM infusion set into her r upper abdomen without any difficulty.   We reviewed the allerts and alarms and she had no final questions. She will wear this for 1-2 weeks and call me when she is ready to use the sensor, and learn how to use the temp basal rate and duel wave bolus.

## 2015-05-03 NOTE — Patient Instructions (Signed)
Read over book 2 and call if questions.

## 2015-05-10 ENCOUNTER — Ambulatory Visit (INDEPENDENT_AMBULATORY_CARE_PROVIDER_SITE_OTHER): Payer: BLUE CROSS/BLUE SHIELD | Admitting: Family Medicine

## 2015-05-10 VITALS — BP 124/86 | HR 104 | Temp 98.3°F | Wt 156.4 lb

## 2015-05-10 DIAGNOSIS — R609 Edema, unspecified: Secondary | ICD-10-CM | POA: Insufficient documentation

## 2015-05-10 DIAGNOSIS — R600 Localized edema: Secondary | ICD-10-CM | POA: Diagnosis not present

## 2015-05-10 NOTE — Assessment & Plan Note (Signed)
Infraorbital edema possibly related to allergic exposure. Clinical picture not consistent with preseptal (much less orbital) cellulitis, primary ocular condition (e.g. keratitis in contact lens wearer) especially given bilateral nature, or solitary manifestation of new-onset systemic auto-immune disorder. Advised observation with strict return precautions. She will schedule follow up in 1 week and cancel if improved as expected. Increase zyrtec to BID during that time and try cold compresses.

## 2015-05-10 NOTE — Patient Instructions (Signed)
Thank you for coming in today!  The most likely cause of your facial swelling is allergic in nature. Sometimes we figure out what this is due to and sometimes we don't.   For now, I would take zyrtec TWICE A DAY for 7 days and then go back to once daily. Cold compress to the area will also help.   I don't think we're dealing with an infection or any other systemic disease at this time, but I do want to stress that if symptoms continue for a week or two, you should come back and start a work up for other things. But common things being common, with the symptoms you're having, this should go away on its own, and hopefully faster with some treatment.   Our clinic's number is [phone]. Feel free to call any time with questions or concerns. We will answer any questions after hours with our 24-hour emergency line at that number as well.   - Dr. Jarvis NewcomerGrunz

## 2015-05-10 NOTE — Progress Notes (Signed)
Subjective: Sierra Schneider is a 30 y.o. female with a history of allergies presenting for facial swelling.   She reports waking up this morning with puffy eyes, the area below and involving the eyelids of both eyes was painfully swollen and pink but not red. She denies changes in vision, redness of eyes or pain with moving eyes. She wears contacts and never sleeps in them. She was well last night. Blinking does not cause pain. Her hands have been swelling a bit intermittently, but no leg swelling. No chest pain, dyspnea, palpitations, change in urine output, jaundice, fever. The swelling has improved over the course of the day. This has never happened before. Denies using any new makeup, medications, or topical products. No sensation of tongue, throat, or neck swelling, or stridor.   - ROS: As above - Non-smoker - Has history of recurrent sinusitis, though denies any symptoms including runny nose, congestion, facial fullness, sore throat, cough.  - Takes zyrtec daily. No ACE inhibitors or ARBs.   Objective: BP 124/86 mmHg  Pulse 104  Temp(Src) 98.3 F (36.8 C) (Oral)  Wt 156 lb 6.4 oz (70.943 kg) Gen: Well-appearing 30 y.o. female in no distress HEENT: Normocephalic, sclerae shite, conjunctivae noninjected, no blepharitis, PERL, no pain with full EOM. Subcutaneous edema infraorbitally with tenderness to palpation. No frontal sinus tenderness. Ear canals and TMs normal. Oropharynx normal, good dentition.  Extremities: Trace edema in hands, wedding band tight. Feet/legs without edema.  Assessment/Plan: Sierra JewettMelinda D Smullen is a 30 y.o. female here for idiopathic, possibly allergic facial edema.  See problem list

## 2015-05-11 ENCOUNTER — Other Ambulatory Visit: Payer: Self-pay | Admitting: Endocrinology

## 2015-05-23 ENCOUNTER — Telehealth: Payer: Self-pay | Admitting: Nutrition

## 2015-05-23 NOTE — Telephone Encounter (Signed)
I have tried to contact her several times last week and today, to try to schedule her 2nd pump training.  Her phone is not able to leave a voice mail.  I will continue to try

## 2015-05-24 NOTE — Telephone Encounter (Signed)
Appt. Set for finishing pump training and cgm training on 06/08/15

## 2015-06-06 ENCOUNTER — Other Ambulatory Visit: Payer: Self-pay | Admitting: *Deleted

## 2015-06-06 DIAGNOSIS — F329 Major depressive disorder, single episode, unspecified: Secondary | ICD-10-CM

## 2015-06-06 DIAGNOSIS — F32A Depression, unspecified: Secondary | ICD-10-CM

## 2015-06-07 ENCOUNTER — Encounter: Payer: BLUE CROSS/BLUE SHIELD | Attending: Endocrinology | Admitting: Nutrition

## 2015-06-07 DIAGNOSIS — E1065 Type 1 diabetes mellitus with hyperglycemia: Secondary | ICD-10-CM

## 2015-06-07 DIAGNOSIS — E109 Type 1 diabetes mellitus without complications: Secondary | ICD-10-CM | POA: Insufficient documentation

## 2015-06-07 DIAGNOSIS — Z713 Dietary counseling and surveillance: Secondary | ICD-10-CM | POA: Insufficient documentation

## 2015-06-07 MED ORDER — DULOXETINE HCL 30 MG PO CPEP: 30.0000 mg | ORAL_CAPSULE | Freq: Every day | ORAL | Status: DC

## 2015-06-07 MED ORDER — DULOXETINE HCL 60 MG PO CPEP
60.0000 mg | ORAL_CAPSULE | Freq: Every day | ORAL | Status: DC
Start: 2015-06-07 — End: 2015-11-02

## 2015-06-07 NOTE — Patient Instructions (Signed)
Review manual for sensor insertions and call if questions.

## 2015-06-07 NOTE — Progress Notes (Signed)
Finished training on insulin pump.  Discussion on when/how to use temp. Basal rates, combination boluses, sick day guidelines and high blood sugar protocols.   She had no questions  about this. Discussed the difference between interstisial sugar level and blood sugar levels and connected sensor to pump.  Sensor settings:  High alert 250.  She did not want a alert before high.  Low alert: 80, with no alert before low.  Suspend on low: on.   We did the sensor insertion and we reviewed how/when to do calibrations.  She reported good understanding of this.   She had no final questions.  She signed off on pump training and cgm training checklists with no final questions. She did not sign up for [facility] , and was encouraged to do this before visit with Dr. Lucianne MussKumar next month.  She agreed do do this.

## 2015-07-11 ENCOUNTER — Other Ambulatory Visit (INDEPENDENT_AMBULATORY_CARE_PROVIDER_SITE_OTHER): Payer: BLUE CROSS/BLUE SHIELD

## 2015-07-11 DIAGNOSIS — E1065 Type 1 diabetes mellitus with hyperglycemia: Secondary | ICD-10-CM | POA: Diagnosis not present

## 2015-07-11 DIAGNOSIS — IMO0002 Reserved for concepts with insufficient information to code with codable children: Secondary | ICD-10-CM

## 2015-07-11 LAB — COMPREHENSIVE METABOLIC PANEL
ALK PHOS: 92 U/L (ref 39–117)
ALT: 13 U/L (ref 0–35)
AST: 14 U/L (ref 0–37)
Albumin: 4.5 g/dL (ref 3.5–5.2)
BILIRUBIN TOTAL: 0.5 mg/dL (ref 0.2–1.2)
BUN: 9 mg/dL (ref 6–23)
CO2: 30 mEq/L (ref 19–32)
Calcium: 9.3 mg/dL (ref 8.4–10.5)
Chloride: 101 mEq/L (ref 96–112)
Creatinine, Ser: 0.63 mg/dL (ref 0.40–1.20)
GFR: 116.91 mL/min (ref >60.00)
GLUCOSE: 141 mg/dL — AB (ref 70–99)
Potassium: 4 mEq/L (ref 3.5–5.1)
SODIUM: 137 meq/L (ref 135–145)
TOTAL PROTEIN: 7 g/dL (ref 6.0–8.3)

## 2015-07-11 LAB — LIPID PANEL
Cholesterol: 214 mg/dL — ABNORMAL HIGH (ref 0–200)
HDL: 41.6 mg/dL (ref >39.00)
LDL Cholesterol: 146 mg/dL — ABNORMAL HIGH (ref 0–99)
NONHDL: 172.06
Total CHOL/HDL Ratio: 5
Triglycerides: 132 mg/dL (ref 0.0–149.0)
VLDL: 26.4 mg/dL (ref 0.0–40.0)

## 2015-07-11 LAB — HEMOGLOBIN A1C: HEMOGLOBIN A1C: 8.5 % — AB (ref 4.6–6.5)

## 2015-07-12 LAB — FRUCTOSAMINE: FRUCTOSAMINE: 362 umol/L — AB (ref 0–285)

## 2015-07-15 ENCOUNTER — Ambulatory Visit: Payer: BLUE CROSS/BLUE SHIELD | Admitting: Endocrinology

## 2015-07-18 ENCOUNTER — Encounter: Payer: Self-pay | Admitting: Endocrinology

## 2015-07-18 ENCOUNTER — Other Ambulatory Visit: Payer: Self-pay | Admitting: Endocrinology

## 2015-07-18 ENCOUNTER — Ambulatory Visit (INDEPENDENT_AMBULATORY_CARE_PROVIDER_SITE_OTHER): Payer: BLUE CROSS/BLUE SHIELD | Admitting: Endocrinology

## 2015-07-18 VITALS — BP 118/84 | HR 94 | Temp 97.7°F | Resp 14 | Ht 64.0 in | Wt 155.8 lb

## 2015-07-18 DIAGNOSIS — E7800 Pure hypercholesterolemia, unspecified: Secondary | ICD-10-CM | POA: Diagnosis not present

## 2015-07-18 DIAGNOSIS — E1065 Type 1 diabetes mellitus with hyperglycemia: Secondary | ICD-10-CM

## 2015-07-18 MED ORDER — ROSUVASTATIN CALCIUM 10 MG PO TABS: 10.0000 mg | ORAL_TABLET | Freq: Every day | ORAL | Status: DC

## 2015-07-18 NOTE — Patient Instructions (Signed)
New settings  Extend for hi fat

## 2015-07-18 NOTE — Progress Notes (Signed)
Patient ID: Sierra Schneider, female   DOB: 02-14-1986, 30 y.o.   MRN: [id number]   Reason for Appointment: Diabetes followup:   History of Present Illness   Diagnosis: Type 1 DIABETES MELITUS     DIABETES history: She has had diabetes since about 2011 and has been on insulin pump for at least 2 years She had fairly good control in the first 2 years of her disease but blood sugar has been more difficult in the last year at least  CURRENT insulin pump:  One Touch Ping  SETTINGS are:   Basal rate: Midnight =1.0 , 4 AM = 1.1, 9 AM = 0.55, 12 noon = 1.2 , 6 PM = 0.9 and 7 PM = 0.45.   Total basal insulin = 20.5units Carb Ratio: 1:10. Correction factor 1: 30, target 100 Total insulin  = 59 units with 65% bolus  Her A1c is higher at 8.5 This is despite her using the 630 insulin pump for the last month or so   Current blood sugar patterns and problems identified:  Overnight blood sugars are generally declining after starting of high and also occasionally has had a suspend on low early morning  Now not usually eating breakfast but her blood sugars are trending somewhat higher midmorning  Blood sugars are mostly averaging about 160-190 the rest of the day with her tendency to increase further after supper but also late in the evening with a highest reading about 210 average at bedtime  Still not losing weight  She is still not using any extended boluses despite discussing all to do this and she is frequently having higher fat meals in the evening with her family previous medical in the higher readings later in the evening  She is occasionally having low sugar symptoms but is usually treating inappropriately with food or candy unless she has juice available  She is not doing any exercise despite reminders  Pump download indicates that she is taking about 4-5 boluses per day on average  CONTINUOUS GLUCOSE MONITORING RECORD INTERPRETATION    Dates of Recording: 5/8 through  07/17/15  Sensor  summary:  Average blood glucose for the period of recording is 176 with standard deviation 59 and she has been using the sensor most of the time. She is getting 2.5 high serum glucose times per day and one low alarm per day      Glycemic patterns:  Hyperglycemic episodes: 70 episodes with 38 of them related to basal rate decrease and 20 related to rising glucose sensor rate of change without boluses   Hypoglycemic episodes occurred 5 times, most often preceded by hyperglycemia      Overnight periods:  Her blood sugars are mostly starting of high at late night with gradual decline until the morning with variable rate of decline until early morning.  Blood sugar was relatively low only once at about 1:30 AM      Preprandial periods:  Frequently not eating breakfast, blood sugars at lunch average to 164 and at dinner 182, more variable in the evening      Postprandial periods:  After breakfast has mildly increased although frequently not having a meal at this time but blood sugars rising about 60 mg in the mornings.  After lunch blood sugars overall LAD.  At dinnertime also there is no significant increase usually Food boluses are  8 units on an average      Hypoglycemia: Suspend on low occurred on 11 of the days  longest duration 3 hours and 8 minutes on 5/20.  These suspend on low is occurring occasionally early morning, sporadically in the late afternoon and early evening but also occasionally will in 2-3 hours of her bolus  Low sugars are mostly documented around 2:30 PM and 4:30 PM with low normal readings occasionally at night and about 1 PM       Mean values apply above for all meters except median for One Touch  PRE-MEAL Fasting Lunch Dinner Bedtime Overall  Glucose range: 94-207    173-393    Mean/median: 153  157  176  236       Wt Readings from Last 3 Encounters:  07/18/15 155 lb 12.8 oz (70.67 kg)  05/10/15 156 lb 6.4 oz (70.943 kg)  04/13/15 157 lb 3.2 oz  (71.305 kg)     LABS:  Lab Results  Component Value Date   HGBA1C 8.5* 07/11/2015   HGBA1C 7.6* 04/07/2015   HGBA1C 7.4 01/11/2015   Lab Results  Component Value Date   MICROALBUR 1.4 04/07/2015   LDLCALC 146* 07/11/2015   CREATININE 0.63 07/11/2015        Medication List       This list is accurate as of: 07/18/15  8:46 PM.  Always use your most recent med list.               cetirizine 10 MG tablet  Commonly known as:  ZYRTEC  Take 1 tablet (10 mg total) by mouth daily.     DULoxetine 30 MG capsule  Commonly known as:  CYMBALTA  Take 1 capsule (30 mg total) by mouth daily. To be taken with prescription of 60 mg of Cymbalta for total of 90 mg daily.     DULoxetine 60 MG capsule  Commonly known as:  CYMBALTA  Take 1 capsule (60 mg total) by mouth daily.     fluticasone 50 MCG/ACT nasal spray  Commonly known as:  FLONASE  Place 2 sprays into both nostrils daily.     NOVOLOG 100 UNIT/ML injection  Generic drug:  insulin aspart  INJECT 65 UNITS SUBCUTANEOUSLY ONCE DAILY IN INSULIN PUMP     pravastatin 40 MG tablet  Commonly known as:  PRAVACHOL  Take 1 tablet (40 mg total) by mouth daily.        Allergies: No Known Allergies  Past Medical History  Diagnosis Date  . Diabetes mellitus 08/2009    late onset type 1    Past Surgical History  Procedure Laterality Date  . Cesarean section    . No past surgeries    . Esophagogastroduodenoscopy Left 10/14/2012    Procedure: ESOPHAGOGASTRODUODENOSCOPY (EGD);  Surgeon: Vertell Novak., MD;  Location: [facility] ENDOSCOPY;  Service: Endoscopy;  Laterality: Left;    Family History  Problem Relation Age of Onset  . Depression Mother   . Depression Father   . Kidney disease Paternal Grandmother   . Diabetes Maternal Grandfather   . Heart disease Neg Hx     Social History:  reports that she quit smoking about 17 months ago. She has never used smokeless tobacco. She reports that she drinks alcohol. She reports  that she does not use illicit drugs.  REVIEW of systems:  She has had mild hypercholesterolemia,  She thinks she has been cutting back on high-fat meats Because of her family history of hypercholesterolemia and her diabetes she was started on pravastatin She thinks she is consistently compliant with this but her LDL is  not any better   Lab Results  Component Value Date   CHOL 214* 07/11/2015   CHOL 215* 04/07/2015   CHOL 189 03/13/2014   Lab Results  Component Value Date   HDL 41.60 07/11/2015   HDL 46.10 04/07/2015   HDL 54 03/13/2014   Lab Results  Component Value Date   LDLCALC 146* 07/11/2015   LDLCALC 140* 04/07/2015   LDLCALC 112* 03/13/2014   Lab Results  Component Value Date   TRIG 132.0 07/11/2015   TRIG 145.0 04/07/2015   TRIG 113 03/13/2014   Lab Results  Component Value Date   CHOLHDL 5 07/11/2015   CHOLHDL 5 04/07/2015   CHOLHDL 3.5 03/13/2014   Lab Results  Component Value Date   LDLDIRECT 129.4 12/29/2012       She has been taking  Cymbalta 90 mg for depression with good control  EXAM:  BP 118/84 mmHg  Pulse 94  Temp(Src) 97.7 F (36.5 C)  Resp 14  Ht 5\' 4"  (1.626 m)  Wt 155 lb 12.8 oz (70.67 kg)  BMI 26.73 kg/m2  SpO2 97%    ASSESSMENT:    See history of present illness for detailed discussion of his current management, blood sugar patterns and problems identified Her blood sugar control is overall worse with A1c 8.5 Even with using her condition is glucose monitoring so far blood sugars are not improving  Recommendations:    She will increase her basal rate between 7 AM-10 AM up to 1.25 since blood sugars are rising midmorning  Also will increase her basal rate between 6 PM and 7 PM up to 1.05 and also 7 PM up to 0.6  Basal rate will be reduced from 5 AM until 7 AM down to 1.0  She will try to bolus with extended patterns for her evening meals when eating high-fat foods and this will avoid inconsistent readings in the  evenings between supper and bedtime  She will try to get glucose tablets for use during hypoglycemia  Regular exercise  Consider further diabetes education  HYPERCHOLESTEROLEMIA: Not clear why her lipids are not better with Pravachol, will change to Crestor 10 mg daily  Patient Instructions  New settings  Extend for hi fat     Counseling time on subjects discussed above is over 50% of today's 25 minute visit   Mercedes Fort 07/18/2015, 8:46 PM

## 2015-07-27 ENCOUNTER — Other Ambulatory Visit: Payer: Self-pay | Admitting: Endocrinology

## 2015-07-27 ENCOUNTER — Telehealth: Payer: Self-pay | Admitting: Nutrition

## 2015-07-27 NOTE — Telephone Encounter (Signed)
ok 

## 2015-07-27 NOTE — Telephone Encounter (Signed)
Sierra AlcideMelinda called requesting help with changing her basal rates per Dr. Remus BlakeKumar's request last week. New basal rates:  MN-7AM: 1.0, 7AM-10AM: 1.25, 10AM-12PM: 0.55, 12P: 1.2, 6PM: 1.05, 7PM: 0.6 I talked her through theses changes and she reviewed the settings and read them to me from her pump correctly.

## 2015-07-28 ENCOUNTER — Telehealth: Payer: Self-pay | Admitting: Endocrinology

## 2015-07-28 NOTE — Telephone Encounter (Signed)
[facility] Note: Caller states she is needing a prescription refill and she is completely out of her insulin.

## 2015-07-28 NOTE — Telephone Encounter (Signed)
I spoke with her and let her know that it was sent in on 05/31, she states that it was already taken care of.

## 2015-08-25 ENCOUNTER — Other Ambulatory Visit (INDEPENDENT_AMBULATORY_CARE_PROVIDER_SITE_OTHER): Payer: BLUE CROSS/BLUE SHIELD

## 2015-08-25 DIAGNOSIS — E7800 Pure hypercholesterolemia, unspecified: Secondary | ICD-10-CM | POA: Diagnosis not present

## 2015-08-25 DIAGNOSIS — E1065 Type 1 diabetes mellitus with hyperglycemia: Secondary | ICD-10-CM | POA: Diagnosis not present

## 2015-08-25 LAB — COMPREHENSIVE METABOLIC PANEL
ALT: 24 U/L (ref 0–35)
AST: 23 U/L (ref 0–37)
Albumin: 4.3 g/dL (ref 3.5–5.2)
Alkaline Phosphatase: 101 U/L (ref 39–117)
BUN: 10 mg/dL (ref 6–23)
CALCIUM: 9.3 mg/dL (ref 8.4–10.5)
CHLORIDE: 103 meq/L (ref 96–112)
CO2: 30 meq/L (ref 19–32)
CREATININE: 0.69 mg/dL (ref 0.40–1.20)
GFR: 105.18 mL/min (ref >60.00)
Glucose, Bld: 188 mg/dL — ABNORMAL HIGH (ref 70–99)
POTASSIUM: 4.1 meq/L (ref 3.5–5.1)
Sodium: 138 mEq/L (ref 135–145)
Total Bilirubin: 0.5 mg/dL (ref 0.2–1.2)
Total Protein: 7.2 g/dL (ref 6.0–8.3)

## 2015-08-25 LAB — LIPID PANEL
CHOL/HDL RATIO: 3
Cholesterol: 149 mg/dL (ref 0–200)
HDL: 53.2 mg/dL (ref >39.00)
LDL CALC: 81 mg/dL (ref 0–99)
NonHDL: 95.63
TRIGLYCERIDES: 71 mg/dL (ref 0.0–149.0)
VLDL: 14.2 mg/dL (ref 0.0–40.0)

## 2015-08-26 LAB — FRUCTOSAMINE: FRUCTOSAMINE: 374 umol/L — AB (ref 0–285)

## 2015-08-31 ENCOUNTER — Ambulatory Visit: Payer: BLUE CROSS/BLUE SHIELD | Admitting: Endocrinology

## 2015-09-21 ENCOUNTER — Encounter: Payer: Self-pay | Admitting: Student

## 2015-09-21 ENCOUNTER — Ambulatory Visit (INDEPENDENT_AMBULATORY_CARE_PROVIDER_SITE_OTHER): Payer: BLUE CROSS/BLUE SHIELD | Admitting: Student

## 2015-09-21 VITALS — BP 121/85 | HR 115 | Temp 98.5°F | Ht 64.0 in | Wt 152.4 lb

## 2015-09-21 DIAGNOSIS — J020 Streptococcal pharyngitis: Secondary | ICD-10-CM

## 2015-09-21 LAB — POCT RAPID STREP A (OFFICE): Rapid Strep A Screen: NEGATIVE

## 2015-09-21 NOTE — Progress Notes (Signed)
   Subjective:    Patient ID: Sierra Schneider, female    DOB: 1985/12/25, 30 y.o.   MRN: [id number]  CC: Sore throat  HPI #sore throat:  for 6 days. Reports runny nose, fever and cough. Home temp 100.35F at home two days ago. Cough is dry.   Reports history of allergy. Pain worse at night and in the morning. Reprots postnasal dripping and clearing throats quite often. Denies heartburn or reflux. Reports history of sinus infection. About 4 times a year. Denies skin rash, sick contact, itchy eyes.  Denies shortness of breath or chest pain.  Has been drinking well but hasn't been eating as she likes to.  Tried halls (sugar free) and sudafed which didn't help.  Social history: does office work for [employer].   Recently restarted smoking. About half a pack a day.   Review of Systems Per HPI Objective:   Vitals:   09/21/15 1543  BP: 121/85  Pulse: (!) 115  Temp: 98.5 F (36.9 C)  TempSrc: Oral  Weight: 152 lb 6.4 oz (69.1 kg)  Height: 5\' 4"  (1.626 m)    GEN: appears well, no apparent distress. HEENT: normocephalic and atraumatic, without conjunctival injection, sclera anicteric, normal TM and ear canal, Positive for rhinorrhea, congestion or erythema of turbinates bilaterally, mmm without erythema or exudation HEM: appreciate lymphadenopathy in her right anterior cervical triangle CVS: RRR, normal s1 and s2, no murmurs, no edema RESP: no increased work of breathing, good air movement bilaterally, no crackles or wheeze NEURO: alert and oriented appropriately, no gross defecits  PSYCH: appropriate mood and affect     Assessment & Plan:  This is likely viral URI. She has rhinorrhea with swollen and red turbinates. She has 1-2 out of 4 on Centor's criteria (lymphadenopathy and questionable fever). Rapid strep negative. Doubt mono. Unlikely pneumonia with a chest pain or shortness of breath. Discussed symptomatic management including adequate hydration, trial of Sudafed 12 hour  plus or minus second-generation antihistamine. Discussed return precautions.   Discussed the implication of smoking on her health especially with her diabetes and advised her to stop smoking

## 2015-09-21 NOTE — Patient Instructions (Signed)
It was great seeing you today! We have addressed the following issues today  1. Sore throat: This is likely viral infection. Symptoms might last 3-4 weeks. There is no specific treatment for viral infection. You can try 12 hour Sudafed which is found behind the counter in pharmacy. You may need to show your license to purchase this. You can also take Zyrtec or Claritin along with this. Keep yourself hydrated. Please come back and see [place] if your symptoms are worsening or you happen to have chest pain or trouble breathing    If we did any lab work today, and the results require attention, either me or my nurse will get in touch with you. If everything is normal, you will get a letter in mail. If you don't hear from [place] in two weeks, please give [place] a call. Otherwise, I look forward to talking with you again at our next visit. If you have any questions or concerns before then, please call the clinic at [phone].  Please bring all your medications to every doctors visit   Sign up for My Chart to have easy access to your labs results, and communication with your Primary care physician.    Please check-out at the front desk before leaving the clinic.   Take Care,

## 2015-09-22 ENCOUNTER — Encounter: Payer: Self-pay | Admitting: Internal Medicine

## 2015-09-22 ENCOUNTER — Ambulatory Visit (INDEPENDENT_AMBULATORY_CARE_PROVIDER_SITE_OTHER): Payer: BLUE CROSS/BLUE SHIELD | Admitting: Internal Medicine

## 2015-09-22 DIAGNOSIS — J329 Chronic sinusitis, unspecified: Secondary | ICD-10-CM

## 2015-09-22 MED ORDER — AMOXICILLIN-POT CLAVULANATE 875-125 MG PO TABS: 1.0000 | ORAL_TABLET | Freq: Two times a day (BID) | ORAL | 0 refills | Status: DC

## 2015-09-22 MED ORDER — AZITHROMYCIN 250 MG PO TABS: ORAL_TABLET | ORAL | 0 refills | Status: DC

## 2015-09-22 NOTE — Assessment & Plan Note (Signed)
History and physical consistent with acute sinusitis. Pt likely had a virus initially and was getting over it, but then acutely worsened with copious green nasal discharge and facial pain. - Will treat with Augmentin x 10 days - Pt counseled on smoking cessation, which is likely contributing to her recurrent sinus infections. - Follow-up if no improvement.

## 2015-09-22 NOTE — Patient Instructions (Addendum)
It was so nice to meet you!  I have prescribed Augmentin. Please take 1 tablet twice a day for 10 days.  I hope you start feeling better soon. If you are not better in 1 week, please come back to see [place].  -Dr. Nancy Marus

## 2015-09-22 NOTE — Progress Notes (Signed)
   [facility] Phone: [phone]  Subjective:  Pt presents with cough, congestion, sinus pressure, and ear pain for the last 7 days. She was seen in clinic yesterday and was starting to feel better. She then became acutely worse with heavy green nasal discharge and facial pain. She endorses fatigue, decreased appetite, and sore throat. Her temperature has been 100.2 at its highest. No sick contacts. She has tried Sudafed and it didn't help at all. No nausea, no vomiting, no abdominal pain.   ROS: See HPI for pertinent positives and negatives Past Medical History- T1DM, recurrent sinusitis Reviewed problem list.  Medications- reviewed and updated Current Outpatient Prescriptions  Medication Sig Dispense Refill  . amoxicillin-clavulanate (AUGMENTIN) 875-125 MG tablet Take 1 tablet by mouth 2 (two) times daily. 20 tablet 0  . cetirizine (ZYRTEC) 10 MG tablet Take 1 tablet (10 mg total) by mouth daily. 30 tablet 11  . DULoxetine (CYMBALTA) 30 MG capsule Take 1 capsule (30 mg total) by mouth daily. To be taken with prescription of 60 mg of Cymbalta for total of 90 mg daily. 30 capsule 3  . DULoxetine (CYMBALTA) 60 MG capsule Take 1 capsule (60 mg total) by mouth daily. 30 capsule 3  . fluticasone (FLONASE) 50 MCG/ACT nasal spray Place 2 sprays into both nostrils daily. 16 g 2  . NOVOLOG 100 UNIT/ML injection INJECT 65 UNITS SUBCUTANEOUSLY ONCE DAILY IN INSULIN PUMP 20 mL 2  . rosuvastatin (CRESTOR) 10 MG tablet Take 1 tablet (10 mg total) by mouth daily. 30 tablet 3   No current facility-administered medications for this visit.    Chief complaint-noted Family history reviewed for today's visit. No changes. Social history- patient is a current smoker  Objective: BP 112/83   Pulse (!) 110   Temp 98.3 F (36.8 C) (Oral)   Wt 153 lb (69.4 kg)   BMI 26.26 kg/m  Gen: NAD, alert, cooperative with exam HEENT: NCAT, EOMI, fluid seen behind TMs, nasal turbinates  erythematous, oropharynx erythematous without tonsillar exudate, sinuses are very tender to palpation Neck: No cervical lymphadenopathy CV: RRR, no murmur Resp: CTABL, no wheezes or crackles, normal work of breathing GI: SNTND, BS present, no guarding or organomegaly  Assessment/Plan: Acute Sinusitis: History and physical consistent with acute sinusitis. Pt likely had a virus initially and was getting over it, but then acutely worsened with copious green nasal discharge and facial pain. - Will treat with Augmentin x 10 days - Pt counseled on smoking cessation, which is likely contributing to her recurrent sinus infections. - Follow-up if no improvement.   Willadean Carol, MD PGY-2

## 2015-09-27 ENCOUNTER — Telehealth: Payer: Self-pay | Admitting: Endocrinology

## 2015-09-27 MED ORDER — INSULIN ASPART 100 UNIT/ML ~~LOC~~ SOLN: SUBCUTANEOUS | 2 refills | Status: DC

## 2015-09-27 NOTE — Telephone Encounter (Signed)
Same dosage unless she is running short of the prescription before the end of the month

## 2015-09-27 NOTE — Telephone Encounter (Signed)
novolog needs to be called in to [facility] the directions need to be increased

## 2015-09-27 NOTE — Telephone Encounter (Signed)
See below. Current rx states the pt is using 65 units max per day per her insulin pump. Last office visit basal rate was increased to 1.25in the mid morning. Please advise what the new dosage should be. Thanks!

## 2015-09-27 NOTE — Telephone Encounter (Signed)
See below

## 2015-09-27 NOTE — Telephone Encounter (Signed)
Patient is calling about her medication stated she is totally out.she need it today.

## 2015-09-27 NOTE — Telephone Encounter (Signed)
Rx submitted and pt notified.  

## 2015-09-27 NOTE — Telephone Encounter (Signed)
Change daily dose of 80 units

## 2015-09-27 NOTE — Telephone Encounter (Signed)
Pt is completely out and is on antibiotics which is causing her to be out of her insulin early

## 2015-09-27 NOTE — Telephone Encounter (Signed)
Pt stated she is using more insulin and cannot refill due to her insurance.

## 2015-09-28 ENCOUNTER — Encounter: Payer: Self-pay | Admitting: Endocrinology

## 2015-09-28 ENCOUNTER — Ambulatory Visit (INDEPENDENT_AMBULATORY_CARE_PROVIDER_SITE_OTHER): Payer: BLUE CROSS/BLUE SHIELD | Admitting: Endocrinology

## 2015-09-28 VITALS — BP 102/70 | HR 97 | Ht 64.0 in | Wt 155.0 lb

## 2015-09-28 DIAGNOSIS — R000 Tachycardia, unspecified: Secondary | ICD-10-CM | POA: Diagnosis not present

## 2015-09-28 DIAGNOSIS — N915 Oligomenorrhea, unspecified: Secondary | ICD-10-CM

## 2015-09-28 DIAGNOSIS — E1065 Type 1 diabetes mellitus with hyperglycemia: Secondary | ICD-10-CM

## 2015-09-28 NOTE — Progress Notes (Signed)
Patient ID: Sierra Schneider, female   DOB: [birth year], 30 y.o.   MRN: [id number]   Reason for Appointment: Diabetes followup:   History of Present Illness   Diagnosis: Type 1 DIABETES MELITUS     DIABETES history: She has had diabetes since about 2011 and has been on insulin pump for several years She had fairly good control in the first 2 years of her disease but blood sugar has been more difficult subsequently  CURRENT insulin pump:  Medtronic 630 G  SETTINGS are:   Basal rate: Midnight =1.0 , 4 AM = 1.25, 10 AM = 0.55, 12 noon = 1.2 , 6 PM = 1.05 and 7 PM = 0.6.   Total basal insulin = 23 .5units Carb Ratio: 1:10. Correction factor 1: 30, target 100 Total insulin  = 69 units with 68% bolus  Her A1c is higher at 8.5 in 5/17 Her basal rate was increased on her last visit   Current blood sugar patterns and problems identified:  Her blood sugars are overall higher compared to her last visit for no apparent reason  Overnight blood sugars are consistently high except on a couple of occasions early morning when she had a threshold suspend  She has had at least one episode where her blood sugar was persistently over 400 and she did not change her infusion set  On average her blood sugars are running close to 200 throughout the day with only some improvement around suppertime  POSTPRANDIAL readings are not evidently high except on some days after lunch  She is still trying to watch her fat intake and snacks except occasionally at lunchtime when she may have some fast food  She thinks she knows to increase her boluses when she is having a little higher fat intake  She thinks that occasionally her CGM will not be correlating with her fingerstick  Because of her persistently high readings through the night she is getting consistent alarms and she is periodically getting up and bolusing  She is not doing any exercise and does not find time to do this  CONTINUOUS GLUCOSE  MONITORING RECORD INTERPRETATION    Dates of Recording: 7/19 through 09/27/15  Sensor  summary:  Average blood glucose for the period of recording is 213 with standard deviation  79.  She is getting on average 3.1 high alarms per day On an average blood sugars are relatively the same throughout the day with relatively rise after about 8 PM until 3 AM.  Has significant variability at all times especially overnight      Glycemic patterns:  Hyperglycemic episodes: 62 and number mostly preceded by basal rate decrease or bolus with rising since rate of change.   Hypoglycemic episodes occurred 3 times receded by hyperglycemia mostly      Overnight periods:  Blood sugars are averaging about 260 at midnight and gradually coming down but still averaging around 190 when waking up.  Recording shows significant variability and has had low normal to low sugars between 5-7 AM twice      Preprandial periods:  Blood sugars are generally high before each meal, fasting readings are averaging near 200 with some variability.  Frequently not doing a meal bolus in the morning until 10 AM Average blood sugar at lunch 216 and at dinner 180      Postprandial periods:  Blood sugars are quite variable after lunch but not consistently higher and they are relatively flat after evening meal overall  Hypoglycemia: Significant only once at 12:30 PM and also minimal hypoglycemia around 6-7 AM once         Wt Readings from Last 3 Encounters:  09/28/15 155 lb (70.3 kg)  09/22/15 153 lb (69.4 kg)  09/21/15 152 lb 6.4 oz (69.1 kg)     LABS:  Lab Results  Component Value Date   HGBA1C 8.5 (H) 07/11/2015   HGBA1C 7.6 (H) 04/07/2015   HGBA1C 7.4 01/11/2015   Lab Results  Component Value Date   MICROALBUR 1.4 04/07/2015   LDLCALC 81 08/25/2015   CREATININE 0.69 08/25/2015        Medication List       Accurate as of 09/28/15 11:59 PM. Always use your most recent med list.          amoxicillin-clavulanate  875-125 MG tablet Commonly known as:  AUGMENTIN Take 1 tablet by mouth 2 (two) times daily.   cetirizine 10 MG tablet Commonly known as:  ZYRTEC Take 1 tablet (10 mg total) by mouth daily.   DULoxetine 30 MG capsule Commonly known as:  CYMBALTA Take 1 capsule (30 mg total) by mouth daily. To be taken with prescription of 60 mg of Cymbalta for total of 90 mg daily.   DULoxetine 60 MG capsule Commonly known as:  CYMBALTA Take 1 capsule (60 mg total) by mouth daily.   fluticasone 50 MCG/ACT nasal spray Commonly known as:  FLONASE Place 2 sprays into both nostrils daily.   insulin aspart 100 UNIT/ML injection Commonly known as:  NOVOLOG 80 units per insulin pump max daily.   rosuvastatin 10 MG tablet Commonly known as:  CRESTOR Take 1 tablet (10 mg total) by mouth daily.       Allergies: No Known Allergies  Past Medical History:  Diagnosis Date  . Diabetes mellitus 08/2009   late onset type 1    Past Surgical History:  Procedure Laterality Date  . CESAREAN SECTION    . ESOPHAGOGASTRODUODENOSCOPY Left 10/14/2012   Procedure: ESOPHAGOGASTRODUODENOSCOPY (EGD);  Surgeon: Vertell Novak., MD;  Location: [facility] ENDOSCOPY;  Service: Endoscopy;  Laterality: Left;  . NO PAST SURGERIES      Family History  Problem Relation Age of Onset  . Depression Mother   . Depression Father   . Kidney disease Paternal Grandmother   . Diabetes Maternal Grandfather   . Heart disease Neg Hx     Social History:  reports that she quit smoking about 20 months ago. She smoked 0.00 packs per day. She has never used smokeless tobacco. She reports that she drinks alcohol. She reports that she does not use drugs.  REVIEW of systems:  She is complaining about persistent sweating for about 2 months and heat intolerance Low to hot flashes.  She is on Mirena  She has had mild HYPERCHOLESTEROLEMIA,  She thinks she has been cutting back on high-fat meats Because of her family history of  hypercholesterolemia and her diabetes she was started on pravastatin, this was switched to Crestor because of LDL of 146  LDL improved  Lab Results  Component Value Date   CHOL 149 08/25/2015   CHOL 214 (H) 07/11/2015   CHOL 215 (H) 04/07/2015   Lab Results  Component Value Date   HDL 53.20 08/25/2015   HDL 41.60 07/11/2015   HDL 46.10 04/07/2015   Lab Results  Component Value Date   LDLCALC 81 08/25/2015   LDLCALC 146 (H) 07/11/2015   LDLCALC 140 (H) 04/07/2015   Lab Results  Component Value Date   TRIG 71.0 08/25/2015   TRIG 132.0 07/11/2015   TRIG 145.0 04/07/2015   Lab Results  Component Value Date   CHOLHDL 3 08/25/2015   CHOLHDL 5 07/11/2015   CHOLHDL 5 04/07/2015   Lab Results  Component Value Date   LDLDIRECT 129.4 12/29/2012       She has been taking  Cymbalta 90 mg for depression with good control  Lab Results  Component Value Date   TSH 0.88 04/13/2015   TSH 1.092 01/01/2011   FREET4 0.87 04/13/2015     EXAM:  BP 102/70 (BP Location: Left Arm, Patient Position: Sitting, Cuff Size: Normal)   Pulse 97   Ht 5\' 4"  (1.626 m)   Wt 155 lb (70.3 kg)   SpO2 99%   BMI 26.61 kg/m     ASSESSMENT:    See history of present illness for detailed discussion of his current management, blood sugar patterns and problems identified Her blood sugar control is overall worse with Blood sugars higher than the last visit despite increasing her basal rates Last A1c 8.5 She appears to be more insulin resistant This is despite her trying to improve her diet. Most likely needs more basal insulin as currently using 68% of her insulin in boluses Occasionally having infusion set issues and she does not always changing her infusion set when she has persistent hyperglycemia  Recommendations:    She will increase her basal rate settings as follows:  Midnight = 1.2, 7 AM = 1.3, 10 AM = 0.55, 12 noon = 1.3, 6 PM = 1.05 and 7 PM = 0.8  She will change infusion set  if blood sugars are persistently over 3:15  Adjust boluses for higher fat meals and coverall intake with boluses  Regular exercise  Continue to improve diet  She will look into the 670 pump  HYPERCHOLESTEROLEMIA: Much better with Crestor  Patient Instructions  Increase basal rates as discussed  Blood sugars are persistently over 350 boluses change the infusion set  Give extra 2-3 units for higher fat meals and may need for extended boluses for 1-2 hours  Cover all carbohydrates with boluses  Call if blood sugars are consistently high overnight    Counseling time on subjects discussed above is over 50% of today's 25 minute visit   Lache Dagher 09/29/2015, 12:45 PM

## 2015-09-28 NOTE — Patient Instructions (Signed)
Increase basal rates as discussed  Blood sugars are persistently over 350 boluses change the infusion set  Give extra 2-3 units for higher fat meals and may need for extended boluses for 1-2 hours  Cover all carbohydrates with boluses  Call if blood sugars are consistently high overnight

## 2015-09-29 LAB — TSH: TSH: 0.89 u[IU]/mL (ref 0.35–4.50)

## 2015-09-29 NOTE — Telephone Encounter (Signed)
PT is wondering if we have received paperwork from [employer] for her pump upgrade yet?  They stated they have faxed it but she wants to make sure.

## 2015-09-29 NOTE — Telephone Encounter (Signed)
Attempted to reach the pt. We have received the information for this upgrade. Waiting on MD's signature to approve. Will try to advise pt at a later time.

## 2015-09-30 ENCOUNTER — Telehealth: Payer: Self-pay | Admitting: Endocrinology

## 2015-09-30 ENCOUNTER — Other Ambulatory Visit: Payer: Self-pay | Admitting: Endocrinology

## 2015-09-30 DIAGNOSIS — N915 Oligomenorrhea, unspecified: Secondary | ICD-10-CM

## 2015-09-30 LAB — FOLLICLE STIMULATING HORMONE: FSH: 6.8 m[IU]/mL

## 2015-09-30 NOTE — Telephone Encounter (Signed)
See note below to be advised.  Thanks! 

## 2015-09-30 NOTE — Telephone Encounter (Signed)
I contacted the pt and advise of note below. Boyd Kerbs has been advised and has reached out to the lab to add on the [facility]. Pt voiced understanding on lab work and had no further questions at this time.

## 2015-09-30 NOTE — Telephone Encounter (Signed)
I contacted the pt and advised Dr. Lucianne Muss has completed the forms needed for her pump and we have faxed them today. Pt voiced understanding.

## 2015-09-30 NOTE — Addendum Note (Signed)
Addended by: Adline Mango I on: 09/30/2015 01:37 PM   Modules accepted: Orders

## 2015-09-30 NOTE — Telephone Encounter (Signed)
Thyroid is normal, need to add FSH to rule out early menopause.  Have ordered this and please confirm with [facility]

## 2015-09-30 NOTE — Telephone Encounter (Signed)
PT would like for her lab results to be released to MyChart as soon as they are ready.

## 2015-10-26 ENCOUNTER — Other Ambulatory Visit (INDEPENDENT_AMBULATORY_CARE_PROVIDER_SITE_OTHER): Payer: BLUE CROSS/BLUE SHIELD

## 2015-10-26 DIAGNOSIS — E1065 Type 1 diabetes mellitus with hyperglycemia: Secondary | ICD-10-CM | POA: Diagnosis not present

## 2015-10-26 LAB — GLUCOSE, RANDOM: Glucose, Bld: 152 mg/dL — ABNORMAL HIGH (ref 70–99)

## 2015-10-26 LAB — HEMOGLOBIN A1C: Hgb A1c MFr Bld: 8 % — ABNORMAL HIGH (ref 4.6–6.5)

## 2015-11-01 ENCOUNTER — Ambulatory Visit (INDEPENDENT_AMBULATORY_CARE_PROVIDER_SITE_OTHER): Payer: BLUE CROSS/BLUE SHIELD | Admitting: Endocrinology

## 2015-11-01 VITALS — BP 114/78 | HR 100 | Temp 98.2°F | Resp 14 | Ht 64.0 in | Wt 155.4 lb

## 2015-11-01 DIAGNOSIS — E1065 Type 1 diabetes mellitus with hyperglycemia: Secondary | ICD-10-CM

## 2015-11-01 NOTE — Progress Notes (Signed)
Patient ID: Sierra Schneider, female   DOB: 05/28/1985, 30 y.o.   MRN: [id number]   Reason for Appointment: Diabetes followup:   History of Present Illness   Diagnosis: Type 1 DIABETES MELITUS     DIABETES history: She has had diabetes since about 2011 and has been on insulin pump for several years She had fairly good control in the first 2 years of her disease but blood sugar has been more difficult subsequently  CURRENT insulin pump:  Medtronic 630 G  SETTINGS are:    Basal rate on current download = Midnight = 1.2, 7 AM = 1.3, 10 AM = 0.55, 12 noon = 1.3, 6 PM = 1.05 and 7 PM = 0.8  Total basal insulin = 23 .5units Carb Ratio: 1:10. Correction factor 1: 30, target 100 Total insulin  = 69 units with 68% bolus  Her A1c is higher at 8.5 in 5/17 but now it is down to 8% Her basal rate was increased on her last visit at the times when her blood sugars were high   Current blood sugar patterns and problems identified:  Her blood sugars are overall improved  Her basal rates were increased including overnight which has apparently caused lower readings and suspended for low sugars on 3 occasions during the night recently; she thinks this is when she is not eating as much in the evening  HYPOGLYCEMIA has occurred in the evenings between 4-16 roughly without any increased activity level  Lowest blood sugar was 46 when she gave a large bolus for her lunch meal and did not eat as much as anticipated  She has tried to check her sugars fairly regularly throughout the day including at mealtimes; usually not eating breakfast  She does appear to have a rising blood sugar in the mornings until 10 AM for no apparent reason, her basal rate is relatively higher at 7 AM  She is trying to change her infusion sets more consistently but did have a high reading yesterday persistently before she changed  She is not doing any exercise and does not find time to do this  CONTINUOUS GLUCOSE  MONITORING RECORD INTERPRETATION    Dates of Recording: 10/18/15-10/31/15  Sensor  summary:  Average blood glucose for the period of recording is176+/-64   Overall blood sugars start of high within average of 200 at midnight, declining through the night with occasional hypoglycemia and then rise significantly with average over by 10 AM.  Subsequently blood sugars are improved but getting low by suppertime with hypoglycemia between 4-6 PM.  Subsequently blood sugars are rising without any consistent peaks       Glycemic patterns:  Hyperglycemic episodes: 65 mostly with basal rate decrease  Hypoglycemic episodes occurred 9 times mostly preceded by hyperglycemia but also rapid falling Sensor rate of change and only 5 times because of basal rate increase      Overnight periods:  Blood sugars start off averaging about 200 and progressively declined.  Blood sugars are about 150 by about 5 AM.  Has had low normal readings twice during the night and once around to-3 AM had a threshold suspend because of relatively low blood sugar      Preprandial periods:  Usually not eating breakfast but blood sugar in the mornings at breakfast time is usually 192, at lunchtime 147 and suppertime 125      Postprandial periods:  After lunch blood sugars are 174 on an average and after dinner 160 on an  average      Hypoglycemia: Mostly between 4 and 6 PM, most significantly 5-6:30 PM        GLUCOSE readings on fingersticks are averaging 187 with morning average 166 and bedtime 207  Her weight is stable  Wt Readings from Last 3 Encounters:  11/01/15 155 lb 6.4 oz (70.5 kg)  09/28/15 155 lb (70.3 kg)  09/22/15 153 lb (69.4 kg)     LABS:  Lab Results  Component Value Date   HGBA1C 8.0 (H) 10/26/2015   HGBA1C 8.5 (H) 07/11/2015   HGBA1C 7.6 (H) 04/07/2015   Lab Results  Component Value Date   MICROALBUR 1.4 04/07/2015   LDLCALC 81 08/25/2015   CREATININE 0.69 08/25/2015        Medication List         Accurate as of 11/01/15  1:11 PM. Always use your most recent med list.          amoxicillin-clavulanate 875-125 MG tablet Commonly known as:  AUGMENTIN Take 1 tablet by mouth 2 (two) times daily.   cetirizine 10 MG tablet Commonly known as:  ZYRTEC Take 1 tablet (10 mg total) by mouth daily.   DULoxetine 30 MG capsule Commonly known as:  CYMBALTA Take 1 capsule (30 mg total) by mouth daily. To be taken with prescription of 60 mg of Cymbalta for total of 90 mg daily.   DULoxetine 60 MG capsule Commonly known as:  CYMBALTA Take 1 capsule (60 mg total) by mouth daily.   fluticasone 50 MCG/ACT nasal spray Commonly known as:  FLONASE Place 2 sprays into both nostrils daily.   insulin aspart 100 UNIT/ML injection Commonly known as:  NOVOLOG 80 units per insulin pump max daily.   rosuvastatin 10 MG tablet Commonly known as:  CRESTOR Take 1 tablet (10 mg total) by mouth daily.       Allergies: No Known Allergies  Past Medical History:  Diagnosis Date  . Diabetes mellitus 08/2009   late onset type 1    Past Surgical History:  Procedure Laterality Date  . CESAREAN SECTION    . ESOPHAGOGASTRODUODENOSCOPY Left 10/14/2012   Procedure: ESOPHAGOGASTRODUODENOSCOPY (EGD);  Surgeon: Vertell NovakJames L Edwards Jr., MD;  [facility];  Service: Endoscopy;  Laterality: Left;  . NO PAST SURGERIES      Family History  Problem Relation Age of Onset  . Depression Mother   . Depression Father   . Kidney disease Paternal Grandmother   . Diabetes Maternal Grandfather   . Heart disease Neg Hx     Social History:  reports that she quit smoking about 21 months ago. She smoked 0.00 packs per day. She has never used smokeless tobacco. She reports that she drinks alcohol. She reports that she does not use drugs.  REVIEW of systems:   She is on Mirena, Has hot flashes  She has had mild HYPERCHOLESTEROLEMIA,  Because of her family history of hypercholesterolemia and her diabetes she  was previously on pravastatin, this was switched to Crestor because of LDL of 146  LDL improved as follows:  Lab Results  Component Value Date   CHOL 149 08/25/2015   CHOL 214 (H) 07/11/2015   CHOL 215 (H) 04/07/2015   Lab Results  Component Value Date   HDL 53.20 08/25/2015   HDL 41.60 07/11/2015   HDL 46.10 04/07/2015   Lab Results  Component Value Date   LDLCALC 81 08/25/2015   LDLCALC 146 (H) 07/11/2015   LDLCALC 140 (H) 04/07/2015  Lab Results  Component Value Date   TRIG 71.0 08/25/2015   TRIG 132.0 07/11/2015   TRIG 145.0 04/07/2015   Lab Results  Component Value Date   CHOLHDL 3 08/25/2015   CHOLHDL 5 07/11/2015   CHOLHDL 5 04/07/2015   Lab Results  Component Value Date   LDLDIRECT 129.4 12/29/2012       She has been taking  Cymbalta 90 mg for depression with good control    EXAM:  BP 114/78   Pulse 100   Temp 98.2 F (36.8 C)   Resp 14   Ht 5\' 4"  (1.626 m)   Wt 155 lb 6.4 oz (70.5 kg)   SpO2 96%   BMI 26.67 kg/m     ASSESSMENT:    See history of present illness for detailed discussion of  current management, blood sugar patterns and problems identified Her blood sugar control is overall Improving with increasing her basal rates on the last visit for the higher times of the day However she still has periods of hyperglycemia particularly midmorning and late evening into the early part of the night With increasing her settings for basal she is starting to get more hypoglycemia mostly late afternoon   However she has avoided significant hypoglycemia because of the threshold suspend Lowest blood sugar was after taking a bolus and not eating as much She is doing better with infusion set changes  Recommendations:    She will increase her basal rate settings as follows:  7 AM = 1.4, 10 AM = 0.6, 7 PM = 0.9.  New basal rate of 1.15 from 3 PM-6 PM  Again reminded her to change infusion sets more regularly  Discussed adjusting boluses for  higher fat intake again  She may try to bolus in split doses if she is not sure what she is going to eat or how much  Encouraged her to start more walking  Discussed how the 670 pump will be different and more useful and she will start this when available   Patient Instructions  Some boluses can be after meals    Counseling time on subjects discussed above is over 50% of today's 25 minute visit   Goku Harb 11/01/2015, 1:11 PM

## 2015-11-01 NOTE — Patient Instructions (Signed)
Some boluses can be after meals

## 2015-11-02 ENCOUNTER — Other Ambulatory Visit: Payer: Self-pay | Admitting: *Deleted

## 2015-11-02 DIAGNOSIS — F329 Major depressive disorder, single episode, unspecified: Secondary | ICD-10-CM

## 2015-11-02 DIAGNOSIS — F32A Depression, unspecified: Secondary | ICD-10-CM

## 2015-11-03 MED ORDER — DULOXETINE HCL 30 MG PO CPEP: 30.0000 mg | ORAL_CAPSULE | Freq: Every day | ORAL | 3 refills | Status: DC

## 2015-11-03 MED ORDER — DULOXETINE HCL 60 MG PO CPEP: 60.0000 mg | ORAL_CAPSULE | Freq: Every day | ORAL | 3 refills | Status: DC

## 2015-11-16 ENCOUNTER — Encounter: Payer: BLUE CROSS/BLUE SHIELD | Attending: Endocrinology | Admitting: Nutrition

## 2015-11-16 DIAGNOSIS — E1065 Type 1 diabetes mellitus with hyperglycemia: Principal | ICD-10-CM

## 2015-11-16 DIAGNOSIS — IMO0001 Reserved for inherently not codable concepts without codable children: Secondary | ICD-10-CM

## 2015-11-18 NOTE — Assessment & Plan Note (Signed)
Pt. Was instructed on the new 670G pump.  See checklist for all topics discussed.  Her meter and transmitter were linked to her new pump and she was told that she will need to continue to test and calibrate the sensor at least q 8 hours.  She was encouraged to read over the booklet on the 670 pump and sensor, and the need to review the booklet on the AutoMode before coming in, in 2 weeks.  She agreed to do this, and had no final questions. Her carelink log in is [email or web address], and her password is [name] (not sure if [email or web address] is capitalized). She was given the 800 telephone number to call if questions.

## 2015-11-18 NOTE — Patient Instructions (Signed)
Read over the booklets of "Getting started with 670G pump" ,  "Getting started with sensor" Call Kathi SimpersLou Anna in 2 weeks to make an appt. For Auto Mode start

## 2015-11-30 ENCOUNTER — Encounter: Payer: BLUE CROSS/BLUE SHIELD | Attending: Endocrinology | Admitting: *Deleted

## 2015-11-30 DIAGNOSIS — IMO0001 Reserved for inherently not codable concepts without codable children: Secondary | ICD-10-CM

## 2015-11-30 DIAGNOSIS — E1065 Type 1 diabetes mellitus with hyperglycemia: Principal | ICD-10-CM

## 2015-12-04 ENCOUNTER — Other Ambulatory Visit: Payer: Self-pay | Admitting: Endocrinology

## 2015-12-07 NOTE — Progress Notes (Signed)
Pump Upgrade Training: Appt start time: 1400 end time: 1500.   Assessment: Primary concerns today: Patient here for upgrade to Auto Mode on 670G insulin pump  insulin pump. She typically    Medications: see list. Insulin used in pump is Novolog  Intervention:  Pump settings transferred directly from current pump to new pump by patient New meter ID added to pump for communicating Reviewed new features of pump with patient Patient is  signed up on [employer] User Name: [email or web address] Password: [phone]  Patient expressed understanding of info taught and is wearing pump by end of visit.  Follow Up   Patient to see MD for insulin dose adjustments

## 2015-12-14 ENCOUNTER — Encounter: Payer: Self-pay | Admitting: Endocrinology

## 2015-12-14 ENCOUNTER — Ambulatory Visit (INDEPENDENT_AMBULATORY_CARE_PROVIDER_SITE_OTHER): Payer: BLUE CROSS/BLUE SHIELD | Admitting: Endocrinology

## 2015-12-14 VITALS — BP 118/82 | HR 97 | Temp 98.0°F | Resp 14 | Ht 64.0 in | Wt 155.8 lb

## 2015-12-14 DIAGNOSIS — E1065 Type 1 diabetes mellitus with hyperglycemia: Secondary | ICD-10-CM

## 2015-12-14 DIAGNOSIS — R252 Cramp and spasm: Secondary | ICD-10-CM

## 2015-12-14 LAB — BASIC METABOLIC PANEL
BUN: 6 mg/dL (ref 6–23)
CHLORIDE: 100 meq/L (ref 96–112)
CO2: 30 meq/L (ref 19–32)
Calcium: 9.8 mg/dL (ref 8.4–10.5)
Creatinine, Ser: 0.66 mg/dL (ref 0.40–1.20)
GFR: 110.5 mL/min (ref >60.00)
Glucose, Bld: 246 mg/dL — ABNORMAL HIGH (ref 70–99)
POTASSIUM: 4.8 meq/L (ref 3.5–5.1)
Sodium: 136 mEq/L (ref 135–145)

## 2015-12-14 LAB — MAGNESIUM: MAGNESIUM: 1.9 mg/dL (ref 1.5–2.5)

## 2015-12-14 LAB — VITAMIN D 25 HYDROXY (VIT D DEFICIENCY, FRACTURES): VITD: 37.53 ng/mL (ref 30.00–100.00)

## 2015-12-14 MED ORDER — GABAPENTIN 100 MG PO CAPS: 100.0000 mg | ORAL_CAPSULE | Freq: Three times a day (TID) | ORAL | 3 refills | Status: DC

## 2015-12-14 NOTE — Progress Notes (Signed)
Patient ID: Sierra Schneider, female   DOB: 12/18/1985, 30 y.o.   MRN: [id number]   Reason for Appointment: Diabetes followup:   History of Present Illness   Diagnosis: Type 1 DIABETES MELITUS     DIABETES history: She has had diabetes since about 2011 and has been on insulin pump for several years She had fairly good control in the first 2 years of her disease but blood sugar has been more difficult subsequently  CURRENT insulin pump:  Medtronic 670 G  SETTINGS are:   7 AM = 1.4, 10 AM = 0.6, 7 PM = 0.9.  New basal rate of 1.15 from 3 PM-6 PM    Basal rate on current download = Midnight = 1.2, 7 AM = 1.3, 10 AM = 0.55, 12 noon = 1.3, 6 PM = 1.05 and 7 PM = 0.8  Total basal insulin = 23 .5 units Carb Ratio: 1:10. Correction factor 1: 30, target 100 Total insulin  = 57 units with 44% bolus  Her A1c was last 8%  She has started the AUTO mode about 2 weeks ago and is here for follow-up   Current blood sugar patterns and problems identified:  Her blood sugars are overall improved  She is requiring relatively large amounts of basal compared to her last visit  POSTPRANDIAL readings are frequently higher especially after lunch although this appears more consistent more recently.  She thinks she is trying to use homemade lunches more frequently and trying to be better with avoiding high-fat meals  She is bolusing right before eating usually   AVERAGE blood sugar from her sensor is 163+/-62  Blood sugars are in the mild hyperglycemic range 6% of the time and over 200 5011% of the time  Hypoglycemia minimal with 4% of the readings between 50-70 occurring transiently early morning and occasionally late afternoon  HYPERGLYCEMIA appears to be occurring mostly after lunch and supper but occasionally blood sugars are high right after midnight.  She has started exercising with walking and running and is setting her  Pump to a target of 150 temporarily with this with no  unexpected hypoglycemia afterwards.  She has had one episode of hypoglycemia early morning preceded by a bolus during the night and also 2 other episodes of blood sugars in the 50s 1 at bedtime and 1 at 3 AM  PRE-meal blood sugars are averaging 169 at lunch and 173 at supper  POST prandial glucose averaging 219 after lunch and 202 after supper with more fluctuation after lunch     Wt Readings from Last 3 Encounters:  12/14/15 155 lb 12.8 oz (70.7 kg)  12/07/15 157 lb 9.6 oz (71.5 kg)  11/01/15 155 lb 6.4 oz (70.5 kg)     LABS:  Lab Results  Component Value Date   HGBA1C 8.0 (H) 10/26/2015   HGBA1C 8.5 (H) 07/11/2015   HGBA1C 7.6 (H) 04/07/2015   Lab Results  Component Value Date   MICROALBUR 1.4 04/07/2015   LDLCALC 81 08/25/2015   CREATININE 0.69 08/25/2015        Medication List       Accurate as of 12/14/15  5:08 PM. Always use your most recent med list.          cetirizine 10 MG tablet Commonly known as:  ZYRTEC Take 1 tablet (10 mg total) by mouth daily.   DULoxetine 30 MG capsule Commonly known as:  CYMBALTA Take 1 capsule (30 mg total) by mouth daily. To be  taken with prescription of 60 mg of Cymbalta for total of 90 mg daily.   DULoxetine 60 MG capsule Commonly known as:  CYMBALTA Take 1 capsule (60 mg total) by mouth daily.   fluticasone 50 MCG/ACT nasal spray Commonly known as:  FLONASE Place 2 sprays into both nostrils daily.   gabapentin 100 MG capsule Commonly known as:  NEURONTIN Take 1 capsule (100 mg total) by mouth 3 (three) times daily.   insulin aspart 100 UNIT/ML injection Commonly known as:  NOVOLOG 80 units per insulin pump max daily.   rosuvastatin 10 MG tablet Commonly known as:  CRESTOR TAKE 1 TABLET (10 MG TOTAL) BY MOUTH DAILY.       Allergies: No Known Allergies  Past Medical History:  Diagnosis Date  . Diabetes mellitus 08/2009   late onset type 1    Past Surgical History:  Procedure Laterality Date  .  CESAREAN SECTION    . ESOPHAGOGASTRODUODENOSCOPY Left 10/14/2012   Procedure: ESOPHAGOGASTRODUODENOSCOPY (EGD);  Surgeon: Vertell Novak., MD;  Location: [facility] ENDOSCOPY;  Service: Endoscopy;  Laterality: Left;  . NO PAST SURGERIES      Family History  Problem Relation Age of Onset  . Depression Mother   . Depression Father   . Kidney disease Paternal Grandmother   . Diabetes Maternal Grandfather   . Heart disease Neg Hx     Social History:  reports that she quit smoking about 22 months ago. She smoked 0.00 packs per day. She has never used smokeless tobacco. She reports that she drinks alcohol. She reports that she does not use drugs.  REVIEW of systems:  She is complaining about LEG cramps and foot pain for about a month or so She says that she has cramping in her calf muscles randomly at night during her sleep. She is also having some pain in her feet and tingling and have some discomfort on walking also.  She may tend to have some shin splints with her running also    She is on Mirena, Has hot flashes  She has had mild HYPERCHOLESTEROLEMIA,  Because of her family history of hypercholesterolemia and her diabetes she was previously on pravastatin, this was switched to Crestor because of LDL of 146  LDL improved as follows:  Lab Results  Component Value Date   CHOL 149 08/25/2015   CHOL 214 (H) 07/11/2015   CHOL 215 (H) 04/07/2015   Lab Results  Component Value Date   HDL 53.20 08/25/2015   HDL 41.60 07/11/2015   HDL 46.10 04/07/2015   Lab Results  Component Value Date   LDLCALC 81 08/25/2015   LDLCALC 146 (H) 07/11/2015   LDLCALC 140 (H) 04/07/2015   Lab Results  Component Value Date   TRIG 71.0 08/25/2015   TRIG 132.0 07/11/2015   TRIG 145.0 04/07/2015   Lab Results  Component Value Date   CHOLHDL 3 08/25/2015   CHOLHDL 5 07/11/2015   CHOLHDL 5 04/07/2015   Lab Results  Component Value Date   LDLDIRECT 129.4 12/29/2012       She has been taking   Cymbalta 90 mg for depression with good control    EXAM:  BP 118/82   Pulse 97   Temp 98 F (36.7 C)   Resp 14   Ht 5\' 4"  (1.626 m)   Wt 155 lb 12.8 oz (70.7 kg)   SpO2 98%   BMI 26.74 kg/m     ASSESSMENT:    See history of present  illness for detailed discussion of  current management, blood sugar patterns and problems identified  Her blood sugar control is overall Improving with Starting the 670 pump in the auto mode With this her basal insulin overall requirement appears to be proportional slightly higher although her total insulin dose has gone down. POSTPRANDIAL blood sugars are tending to be high periodically especially after lunch with overall average over 200 after her main meals She does tend to have periodic high sugars late night also but not consistently. Hypoglycemia has been reduced but not completely  MUSCLE cramps and foot pain: Not clear of the etiology and will need to consider diabetic neuropathy as well as hypocalcemia or hypomagnesemia  Recommendations:    She will increase her coverage for meals with 1:8 for lunch and 1:9 For her supper.  Continue to watch fat intake.  If her blood sugar is high for meals she can try to bolus up to 15 minutes before eating   Call if having excessive postprandial hyperglycemia  She will call the local rep to learn how to upload the pump which she has not been able to do so far  A1c on the next visit  Trial of gabapentin 100 mg 3 times a day as needed and also she can use tonic water and leg stretches at bedtime   Patient Instructions  Carb 1:8 at lunch and 1:9 at dinner      Counseling time on subjects discussed above is over 50% of today's 25 minute visit   Eligio Angert 12/14/2015, 5:08 PM

## 2015-12-14 NOTE — Progress Notes (Signed)
Please let patient know that the lab result is normal and no change in treatment plan needed

## 2015-12-14 NOTE — Patient Instructions (Addendum)
Carb 1:8 at lunch and 1:9 at dinner

## 2016-01-13 ENCOUNTER — Other Ambulatory Visit: Payer: Self-pay | Admitting: Endocrinology

## 2016-01-24 ENCOUNTER — Other Ambulatory Visit (INDEPENDENT_AMBULATORY_CARE_PROVIDER_SITE_OTHER): Payer: BLUE CROSS/BLUE SHIELD

## 2016-01-24 DIAGNOSIS — E1065 Type 1 diabetes mellitus with hyperglycemia: Secondary | ICD-10-CM | POA: Diagnosis not present

## 2016-01-24 LAB — GLUCOSE, RANDOM: Glucose, Bld: 156 mg/dL — ABNORMAL HIGH (ref 70–99)

## 2016-01-24 LAB — HEMOGLOBIN A1C: Hgb A1c MFr Bld: 7.1 % — ABNORMAL HIGH (ref 4.6–6.5)

## 2016-01-27 ENCOUNTER — Encounter: Payer: Self-pay | Admitting: Endocrinology

## 2016-01-27 ENCOUNTER — Ambulatory Visit (INDEPENDENT_AMBULATORY_CARE_PROVIDER_SITE_OTHER): Payer: BLUE CROSS/BLUE SHIELD | Admitting: Endocrinology

## 2016-01-27 VITALS — BP 118/80 | HR 107 | Wt 151.0 lb

## 2016-01-27 DIAGNOSIS — Z23 Encounter for immunization: Secondary | ICD-10-CM | POA: Diagnosis not present

## 2016-01-27 DIAGNOSIS — E1065 Type 1 diabetes mellitus with hyperglycemia: Secondary | ICD-10-CM | POA: Diagnosis not present

## 2016-01-27 DIAGNOSIS — E7800 Pure hypercholesterolemia, unspecified: Secondary | ICD-10-CM | POA: Diagnosis not present

## 2016-01-27 NOTE — Progress Notes (Signed)
Patient ID: Sierra Schneider, female   DOB: August 16, 1985, 30 y.o.   MRN: [id number]   Reason for Appointment: Diabetes followup:   History of Present Illness   Diagnosis: Type 1 DIABETES MELITUS     DIABETES history: She has had diabetes since about 2011 and has been on insulin pump for several years She had fairly good control in the first 2 years of her disease but blood sugar has been more difficult subsequently  CURRENT insulin pump:  Medtronic 670 G  SETTINGS are:    Basal rate on current download = Midnight = 1.2, 7 AM = 1.4, 10 AM = 0.6, 12 noon = 1.3, 6 PM = 1.05 and 7 PM = 0.9  Total basal insulin = 23 .5 units Carb Ratio: 1:10. Correction factor 1: 30, target 100  Her A1c was last 8% and is now 7.1    Current management, blood sugar patterns and problems identified:  Her blood sugars are overall averaging 142+/-52 on her sensor, previously 163   She is still showing some variability in her blood sugars, not always related to dietary changes  She has been in the auto mode about 78% of the time exiting 3 times because of high blood sugars, twice because of disabling manually as well as twice with the algorithm being under read.  Her blood sugars have been in the TARGET range 73% of the time, high 22% and low 5% of the time with SIGNIFICANT hypoglycemia only 1%  POSTPRANDIAL readings are as follows: BREAKFAST 198 but only has a couple of readings; LUNCH 184 with pre-meal sugars 130 and after supper 192 with pre-meal glucose 165  She was told to increase her carbohydrate coverage for lunch and dinner on her last visit but has not done so  HYPOGLYCEMIA is occurring sporadically in the evening around 4-8 PM, occasionally overnight and twice midday.  Not clear why she has had occasional episodes of hypoglycemia without excessive boluses or physical activity  Hyperglycemia and mostly occurring late evening but also periodically with rebound from low normal or low  sugars  Does not have any consistent patterns of hyperglycemia after any meal.  She thinks some of the variability in low sugars may be related to her appetite being decreased recently until a few days ago and she has lost weight    Wt Readings from Last 3 Encounters:  01/27/16 151 lb (68.5 kg)  12/14/15 155 lb 12.8 oz (70.7 kg)  12/07/15 157 lb 9.6 oz (71.5 kg)     LABS:  Lab Results  Component Value Date   HGBA1C 7.1 (H) 01/24/2016   HGBA1C 8.0 (H) 10/26/2015   HGBA1C 8.5 (H) 07/11/2015   Lab Results  Component Value Date   MICROALBUR 1.4 04/07/2015   LDLCALC 81 08/25/2015   CREATININE 0.66 12/14/2015    OTHER active problems discussed today: See review of systems      Medication List       Accurate as of 01/27/16 11:59 PM. Always use your most recent med list.          cetirizine 10 MG tablet Commonly known as:  ZYRTEC Take 1 tablet (10 mg total) by mouth daily.   DULoxetine 30 MG capsule Commonly known as:  CYMBALTA Take 1 capsule (30 mg total) by mouth daily. To be taken with prescription of 60 mg of Cymbalta for total of 90 mg daily.   DULoxetine 60 MG capsule Commonly known as:  CYMBALTA Take 1 capsule (  60 mg total) by mouth daily.   fluticasone 50 MCG/ACT nasal spray Commonly known as:  FLONASE Place 2 sprays into both nostrils daily.   gabapentin 100 MG capsule Commonly known as:  NEURONTIN Take 1 capsule (100 mg total) by mouth 3 (three) times daily.   NOVOLOG 100 UNIT/ML injection Generic drug:  insulin aspart INJECT 80 UNITS PER INSULIN PUMP MAX DAILY.   rosuvastatin 10 MG tablet Commonly known as:  CRESTOR TAKE 1 TABLET (10 MG TOTAL) BY MOUTH DAILY.       Allergies: No Known Allergies  Past Medical History:  Diagnosis Date  . Diabetes mellitus 08/2009   late onset type 1    Past Surgical History:  Procedure Laterality Date  . CESAREAN SECTION    . ESOPHAGOGASTRODUODENOSCOPY Left 10/14/2012   Procedure:  ESOPHAGOGASTRODUODENOSCOPY (EGD);  Surgeon: Vertell Novak., MD;  Location: [facility] ENDOSCOPY;  Service: Endoscopy;  Laterality: Left;  . NO PAST SURGERIES      Family History  Problem Relation Age of Onset  . Depression Mother   . Depression Father   . Kidney disease Paternal Grandmother   . Diabetes Maternal Grandfather   . Heart disease Neg Hx     Social History:  reports that she quit smoking about 2 years ago. She smoked 0.00 packs per day. She has never used smokeless tobacco. She reports that she drinks alcohol. She reports that she does not use drugs.  REVIEW of systems:  She was complaining about lower leg muscle cramps and foot pain but these were relieved by taking gabapentin that she is not taking about 3 days a week. Her vitamin D and magnesium were okay   She is on Mirena, Has hot flashes  She has had mild HYPERCHOLESTEROLEMIA,  Because of her family history of hypercholesterolemia and her diabetes she was previously on pravastatin, this was switched to Crestor because of LDL of 146  LDL improved as follows:  Lab Results  Component Value Date   CHOL 149 08/25/2015   CHOL 214 (H) 07/11/2015   CHOL 215 (H) 04/07/2015   Lab Results  Component Value Date   HDL 53.20 08/25/2015   HDL 41.60 07/11/2015   HDL 46.10 04/07/2015   Lab Results  Component Value Date   LDLCALC 81 08/25/2015   LDLCALC 146 (H) 07/11/2015   LDLCALC 140 (H) 04/07/2015   Lab Results  Component Value Date   TRIG 71.0 08/25/2015   TRIG 132.0 07/11/2015   TRIG 145.0 04/07/2015   Lab Results  Component Value Date   CHOLHDL 3 08/25/2015   CHOLHDL 5 07/11/2015   CHOLHDL 5 04/07/2015   Lab Results  Component Value Date   LDLDIRECT 129.4 12/29/2012       She has been taking  Cymbalta 90 mg for depression with good control, No changes in her medications recently  She is asking about athlete's foot, not relieved by OTC sprays, has itching    EXAM:  BP 118/80   Pulse (!) 107    Wt 151 lb (68.5 kg)   SpO2 96%   BMI 25.92 kg/m   She has mild athlete's foot on her toes particularly on the plantar surface and in between the toes No pedal edema  ASSESSMENT:    See history of present illness for detailed discussion of  current management, blood sugar patterns and problems identified  Her blood sugar control is overall continuing to improve and her A1c is now 7.1 She has been in the  auto mode recently about 78% the time However she does seem to have continued variability and unexpected hypoglycemia as well as some rebound from low sugars With her decreased appetite and occasionally skipping meals this may be potentially causing lower sugars but eating better Currently her BOLUS amount is only 34% of her daily insulin POSTPRANDIAL blood sugars are not consistently high Hypoglycemia has been occurring rarely overnight unexpectedly also but she is able to alert herself and treatment is accordingly  Day-to-day management of her diabetes, diet and boluses were discussed  MUSCLE cramps and foot pain: Possibly neuropathy, better with gabapentin  Rash and itching on her toes: Likely to be fungus infection  History of hypercholesterolemia: Will need follow-up  Recommendations:    She will currently continue the 1:10 carbohydrate ratio unless she has consistent patterns of higher readings, currently her readings are not consistently high after meals  Continue to watch fat intake.  She will call her PCP if she continues to have decreased appetite  More regular exercise  May try Nizoral ointment for her feet  She will call if she continues to have excessive hyperglycemia  Influenza vaccine given  There are no Patient Instructions on file for this visit.   Counseling time on subjects discussed above is over 50% of today's 25 minute visit   Sierra Schneider 01/29/2016, 10:01 AM

## 2016-01-29 MED ORDER — KETOCONAZOLE 2 % EX CREA: 1.0000 "application " | TOPICAL_CREAM | Freq: Every day | CUTANEOUS | 0 refills | Status: DC

## 2016-03-04 ENCOUNTER — Other Ambulatory Visit: Payer: Self-pay | Admitting: Family Medicine

## 2016-03-05 NOTE — Telephone Encounter (Signed)
Refill requested for Duloxetine. Patient has not been seen in > 1 year for her mood. Will provide 1 refill for duloxetine but patient will need to come in for an appointment ASAP to meet with me in order to receive future refills. [facility] team please inform patient of this. Thank you.   Anders Simmondshristina Breyana Follansbee, MD [facility], PGY-2]

## 2016-03-05 NOTE — Telephone Encounter (Signed)
Patient scheduled for 03/20/16. Sierra Schneider Budd PalmerS Sierra Schneider

## 2016-03-20 ENCOUNTER — Ambulatory Visit (INDEPENDENT_AMBULATORY_CARE_PROVIDER_SITE_OTHER): Payer: BLUE CROSS/BLUE SHIELD | Admitting: Family Medicine

## 2016-03-20 ENCOUNTER — Encounter: Payer: Self-pay | Admitting: Family Medicine

## 2016-03-20 DIAGNOSIS — F32A Depression, unspecified: Secondary | ICD-10-CM

## 2016-03-20 DIAGNOSIS — F329 Major depressive disorder, single episode, unspecified: Secondary | ICD-10-CM

## 2016-03-20 DIAGNOSIS — G4700 Insomnia, unspecified: Secondary | ICD-10-CM

## 2016-03-20 DIAGNOSIS — Z716 Tobacco abuse counseling: Secondary | ICD-10-CM

## 2016-03-20 DIAGNOSIS — Z3009 Encounter for other general counseling and advice on contraception: Secondary | ICD-10-CM

## 2016-03-20 MED ORDER — VARENICLINE TARTRATE 0.5 MG X 11 & 1 MG X 42 PO MISC: ORAL | 0 refills | Status: DC

## 2016-03-20 MED ORDER — DULOXETINE HCL 60 MG PO CPEP: 60.0000 mg | ORAL_CAPSULE | Freq: Every day | ORAL | 3 refills | Status: DC

## 2016-03-20 MED ORDER — MELATONIN 1 MG PO CAPS: 1.0000 mg | ORAL_CAPSULE | Freq: Every day | ORAL | 5 refills | Status: DC

## 2016-03-20 MED ORDER — DULOXETINE HCL 30 MG PO CPEP: 30.0000 mg | ORAL_CAPSULE | Freq: Every day | ORAL | 3 refills | Status: DC

## 2016-03-20 NOTE — Patient Instructions (Addendum)
Thank you for coming in today, it was so nice to see you! Today we talked about:    Mood: I think you would benefit from seeing a psychiatrist. I will place a referral for you. Someone will call you to schedule this appointment. Please continue 90 mg of Cymbalta daily.  Sleep: He can take melatonin every night 1 hour before sleep. Please read about proper sleep hygiene below and see if any of these tips can help you  Smoking: I have written you a prescription for Chantix, please take this when you have your quit date  IUD: Please schedule an appointment to have your IUD out and another one inserted   Please follow up in 3 months to see how you are doing with smoking and sleep.   If we ordered any tests today, you will be notified via telephone of any abnormalities. If everything is normal you will get a letter in the mail.   If you have any questions or concerns, please do not hesitate to call the office at [phone]. You can also message me directly via MyChart.   Sincerely,  Anders Simmonds, MD   Sleep Hygiene:  How can I improve my sleep hygiene? One of the most important sleep hygiene practices is to spend an appropriate amount of time asleep in bed, not too little or too excessive. Sleep needs vary across ages and are especially impacted by lifestyle and health. However, there are recommendations that can provide guidance on how much sleep you need generally. Other good sleep hygiene practices include:  1. Limiting daytime naps to 30 minutes . Napping does not make up for inadequate nighttime sleep. However, a short nap of 20-30 minutes can help to improve mood, alertness and performance.  2. Avoiding stimulants such as  caffeine and nicotine close to bedtime.  And when it comes to alcohol, moderation is key 4. While alcohol is well-known to help you fall asleep faster, too much close to bedtime can disrupt sleep in the second half of the night as the body begins to process the  alcohol.    3. Exercising to promote good quality sleep.  As little as 10 minutes of aerobic exercise, such as walking or cycling, can drastically improve nighttime sleep quality.  For the best night's sleep, most people should avoid strenuous workouts close to bedtime. However, the effect of intense nighttime exercise on sleep differs from person to person, so find out what works best for you.   4. Steering clear of food that can be disruptive right before sleep.   Heavy or rich foods, fatty or fried meals, spicy dishes, citrus fruits, and carbonated drinks can trigger indigestion for some people. When this occurs close to bedtime, it can lead to painful heartburn that disrupts sleep. 5. Ensuring adequate exposure to natural light.  This is particularly important for individuals who may not venture outside frequently. Exposure to sunlight during the day, as well as darkness at night, helps to maintain a healthy sleep-wake cycle . 6. Establishing a regular relaxing bedtime routine.  A regular nightly routine helps the body recognize that it is bedtime. This could include taking warm shower or bath, reading a book, or light stretches. When possible, try to avoid emotionally upsetting conversations and activities before attempting to sleep. 7. Making sure that the sleep environment is pleasant.  Mattress and pillows should be comfortable. The bedroom should be cool - between 60 and 67 degrees - for optimal sleep. Bright light from  lamps, cell phone and TV screens can make it difficult to fall asleep4, so turn those light off or adjust them when possible. Consider using blackout curtains, eye shades, ear plugs, "white noise" machines, humidifiers, fans and other devices that can make the bedroom more relaxing.

## 2016-03-20 NOTE — Assessment & Plan Note (Addendum)
Likely related to depression and anxiety and/or poor sleep hygiene. -We will begin melatonin 1 mg daily at bedtime -Gave patient handout on proper sleep hygiene and asked her to please review it

## 2016-03-20 NOTE — Progress Notes (Signed)
Subjective:    Patient ID: Sierra Schneider , female   DOB: 1985/12/05 , 31 y.o..   MRN: [id number]  HPI  Sierra Schneider is here for  Chief Complaint  Patient presents with  . Medication Refill   Depression And anxiety:   Presenting Issue: Depression  And anxiety  Report of symptoms: Patient states that she has had depression and anxiety since 2012. She knows that she was started on Zoloft in 2012 and did very well with it until it stopped working around 2016. She was then switched to Cymbalta which seemed to help but now it is not helping as much anymore  Age of onset of first mood disturbance: 25  Impact on function: Patient states that she gets very easily stressed out and anxious. Denies any current panic attacks but notes that she has been taking her mom's value sometimes when she feels particularly bad. She also feels like her sleep is suffering. She will just lay awake in bed several times a week. Additionally she has started smoking again because of her mood which he had previously stopped before. She is smoking about 1 pack per day.  Psychiatric History - Diagnoses: Depression And generalized anxiety disorder - Hospitalizations:  None - Pharmacotherapy: Currently Cymbalta 90 mg. She notes that she has tried Associate ProfessorBuSpar and Zoloft in the past and some other medications that she can't remember the name of. - Outpatient therapy: Has been to a therapist in the past and is on medication  Family history of psychiatric issues:None. Current and history of substance use: No  PHQ-9:10   IUD:  Patient states that she has had her IUD in for 7 years. She continues to be sexually active and does not use any protection.  Review of Systems: Per HPI. All other systems reviewed and are negative.  Past Medical History: Patient Active Problem List   Diagnosis Date Noted  . Insomnia 03/20/2016  . General counseling and advice on female contraception 03/20/2016  . Idiopathic edema  05/10/2015  . Recurrent sinusitis 10/23/2014  . Rash and nonspecific skin eruption 08/09/2014  . Hidradenitis axillaris 03/16/2014  . Generalized anxiety disorder 06/29/2013  . Pure hypercholesterolemia 01/03/2013  . Tobacco abuse counseling 04/04/2011  . Depression 12/01/2010  . Uncontrolled type 1 diabetes mellitus (HCC) 09/14/2009    Medications: reviewed and updated Current Outpatient Prescriptions  Medication Sig Dispense Refill  . cetirizine (ZYRTEC) 10 MG tablet Take 1 tablet (10 mg total) by mouth daily. 30 tablet 11  . DULoxetine (CYMBALTA) 30 MG capsule Take 1 capsule (30 mg total) by mouth daily. To be taken with prescription of 60 mg of Cymbalta for total of 90 mg daily. 30 capsule 3  . DULoxetine (CYMBALTA) 60 MG capsule Take 1 capsule (60 mg total) by mouth daily. 30 capsule 3  . fluticasone (FLONASE) 50 MCG/ACT nasal spray Place 2 sprays into both nostrils daily. 16 g 2  . gabapentin (NEURONTIN) 100 MG capsule Take 1 capsule (100 mg total) by mouth 3 (three) times daily. 90 capsule 3  . ketoconazole (NIZORAL) 2 % cream Apply 1 application topically daily. 15 g 0  . Melatonin 1 MG CAPS Take 1 capsule (1 mg total) by mouth at bedtime. 30 capsule 5  . NOVOLOG 100 UNIT/ML injection INJECT 80 UNITS PER INSULIN PUMP MAX DAILY. 30 mL 2  . rosuvastatin (CRESTOR) 10 MG tablet TAKE 1 TABLET (10 MG TOTAL) BY MOUTH DAILY. 30 tablet 3  . varenicline (CHANTIX STARTING MONTH PAK)  0.5 MG X 11 & 1 MG X 42 tablet Take one 0.5 mg tablet by mouth once daily for 3 days, then increase to one 0.5 mg tablet twice daily for 4 days, then increase to one 1 mg tablet twice daily. 53 tablet 0   No current facility-administered medications for this visit.     Social Hx:  reports that she quit smoking about 2 years ago. She smoked 0.00 packs per day. She has never used smokeless tobacco.   Objective:   BP 110/68   Pulse 90   Temp 98.3 F (36.8 C) (Oral)   Ht 5\' 4"  (1.626 m)   Wt 151 lb 9.6 oz  (68.8 kg)   SpO2 99%   BMI 26.02 kg/m  Physical Exam  Gen: NAD, alert, cooperative with exam, well-appearing HEENT: NCAT, PERRL, clear conjunctiva, oropharynx clear, supple neck Cardiac: Regular rate and rhythm, normal S1/S2, no murmur, no edema, capillary refill brisk  Respiratory: Clear to auscultation bilaterally, no wheezes, non-labored breathing Gastrointestinal: soft, non tender, non distended, bowel sounds present Skin: no rashes, normal turgor  Neurological: no gross deficits.  Psych: good insight, normal mood and affect  Assessment & Plan:  Depression Uncontrolled. Also endorsing some anxiety symptoms. Is taking Cymbalta 90 mg daily. Patient states that she has a good support system and is not interested in seeking any counseling or behavioral health specialist at this time. However she is agreeable to seeing a psychiatrist. -Continue him 90 mg of Cymbalta daily  -referral placed for psychiatry   Insomnia Likely related to depression and anxiety and/or poor sleep hygiene. -We will begin melatonin 1 mg daily at bedtime -Gave patient handout on proper sleep hygiene and asked her to please review it  Tobacco abuse counseling Patient has quit previously but now is smoking 1 pack per day as her depression and anxiety is uncontrolled. Counseled patient on importance of quitting smoking and encouraged her that she has done this before and she can do it again. Patient states that she has used Chantix in the past and thought that it really helped her. -Prescription provided for Chantix when patient is ready to quit -Follow-up in 3 months  General counseling and advice on female contraception Patient has had her IUD in for 7 years per her own report, discussed that she will need to have this taken out. She is interested in having a new IUD placed. -Ask patient to please schedule an appointment for IUD removal and insertion -We'll perform Pap smear while placing new IUD at next  visit   Anders Simmonds, MD [facility], PGY-2

## 2016-03-20 NOTE — Assessment & Plan Note (Signed)
Uncontrolled. Also endorsing some anxiety symptoms. Is taking Cymbalta 90 mg daily. Patient states that she has a good support system and is not interested in seeking any counseling or behavioral health specialist at this time. However she is agreeable to seeing a psychiatrist. -Continue him 90 mg of Cymbalta daily  -referral placed for psychiatry

## 2016-03-20 NOTE — Assessment & Plan Note (Signed)
Patient has had her IUD in for 7 years per her own report, discussed that she will need to have this taken out. She is interested in having a new IUD placed. -Ask patient to please schedule an appointment for IUD removal and insertion -We'll perform Pap smear while placing new IUD at next visit

## 2016-03-20 NOTE — Assessment & Plan Note (Signed)
Patient has quit previously but now is smoking 1 pack per day as her depression and anxiety is uncontrolled. Counseled patient on importance of quitting smoking and encouraged her that she has done this before and she can do it again. Patient states that she has used Chantix in the past and thought that it really helped her. -Prescription provided for Chantix when patient is ready to quit -Follow-up in 3 months

## 2016-03-27 ENCOUNTER — Other Ambulatory Visit: Payer: Self-pay | Admitting: Family Medicine

## 2016-03-27 DIAGNOSIS — F32A Depression, unspecified: Secondary | ICD-10-CM

## 2016-03-27 DIAGNOSIS — F329 Major depressive disorder, single episode, unspecified: Secondary | ICD-10-CM

## 2016-04-05 ENCOUNTER — Ambulatory Visit (INDEPENDENT_AMBULATORY_CARE_PROVIDER_SITE_OTHER): Payer: BLUE CROSS/BLUE SHIELD | Admitting: Family Medicine

## 2016-04-05 ENCOUNTER — Encounter: Payer: Self-pay | Admitting: Family Medicine

## 2016-04-05 VITALS — BP 118/72 | HR 97 | Temp 98.4°F | Ht 64.0 in | Wt 150.4 lb

## 2016-04-05 DIAGNOSIS — Z538 Procedure and treatment not carried out for other reasons: Secondary | ICD-10-CM

## 2016-04-05 DIAGNOSIS — Z30433 Encounter for removal and reinsertion of intrauterine contraceptive device: Secondary | ICD-10-CM

## 2016-04-05 LAB — POCT URINE PREGNANCY: Preg Test, Ur: NEGATIVE

## 2016-04-05 MED ORDER — IBUPROFEN 200 MG PO TABS
200.0000 mg | ORAL_TABLET | Freq: Once | ORAL | Status: AC
  Administered 2016-04-05: 200 mg via ORAL

## 2016-04-05 MED ORDER — ACETAMINOPHEN 500 MG PO TABS
500.0000 mg | ORAL_TABLET | Freq: Once | ORAL | Status: AC
  Administered 2016-04-05: 500 mg via ORAL

## 2016-04-05 MED ORDER — OSELTAMIVIR PHOSPHATE 75 MG PO CAPS: 75.0000 mg | ORAL_CAPSULE | Freq: Every day | ORAL | 0 refills | Status: DC

## 2016-04-05 NOTE — Progress Notes (Signed)
IUD Removal & Insertion Procedure Note  Pre-operative Diagnosis: IUD Removal and Insertion  Post-operative Diagnosis: IUD removal  Indications: contraception  Procedure Details  Urine pregnancy test was done today and result was negative.  The risks (including infection, bleeding, pain, and uterine perforation) and benefits of the procedure were explained to the patient and verbal and written informed consent was obtained.    Cervix cleansed with Betadine. IUD string was not visible, hence a rochester forceps was inserted through cervical os and the IUD device was removed without difficulty or bleeding. We then proceeded with the IUD reinsertion.  Uterus sounded to 8 cm. Although the uterine sound went through her cervical os without difficulty, the mirena device would not pass through the cervical os. After multiple insertion attempts the procedure was discontinued.  IUD Information: NA.  Condition: Stable  Complications: None  Plan:  The patient was advised to call for any fever or for prolonged or severe pain or bleeding. She was advised to use OTC acetaminophen as needed for mild to moderate pain. She was given 200 mg Ibuprofen and 500 mg Tylenol during this office visit for cramping. Referral made to Gyn clinic for Mirena insertion. OCP offered but she preferred to use condom while awaiting referral to gyn.  Attending Physician Documentation: I was present for or participated in the entire procedure, including opening and closing.

## 2016-04-05 NOTE — Patient Instructions (Signed)
It was nice seeing you today. I am sorry we were unable to insert your IUD. We have referred you to a Gynecologist for insertion. Now that your IUD is out please use condom for back up while you are awaiting mirena insertion. We have e-scribed your Tamiflu to the pharmacy.

## 2016-04-06 MED ORDER — LEVONORGESTREL 20 MCG/24HR IU IUD
INTRAUTERINE_SYSTEM | Freq: Once | INTRAUTERINE | Status: AC
  Administered 2016-04-05: 1 via INTRAUTERINE

## 2016-04-06 NOTE — Addendum Note (Signed)
Addended by: Jone BasemanFLEEGER, JESSICA D on: 04/06/2016 09:05 AM   Modules accepted: Orders

## 2016-04-10 ENCOUNTER — Encounter: Payer: Self-pay | Admitting: Gynecology

## 2016-04-10 ENCOUNTER — Ambulatory Visit (INDEPENDENT_AMBULATORY_CARE_PROVIDER_SITE_OTHER): Payer: BLUE CROSS/BLUE SHIELD | Admitting: Gynecology

## 2016-04-10 VITALS — BP 118/80 | Ht 64.0 in | Wt 150.0 lb

## 2016-04-10 DIAGNOSIS — R102 Pelvic and perineal pain: Secondary | ICD-10-CM | POA: Diagnosis not present

## 2016-04-10 DIAGNOSIS — N946 Dysmenorrhea, unspecified: Secondary | ICD-10-CM

## 2016-04-10 DIAGNOSIS — Z975 Presence of (intrauterine) contraceptive device: Secondary | ICD-10-CM | POA: Insufficient documentation

## 2016-04-10 DIAGNOSIS — N920 Excessive and frequent menstruation with regular cycle: Secondary | ICD-10-CM

## 2016-04-10 DIAGNOSIS — Z30430 Encounter for insertion of intrauterine contraceptive device: Secondary | ICD-10-CM

## 2016-04-10 MED ORDER — KETOROLAC TROMETHAMINE 30 MG/ML IJ SOLN
30.0000 mg | Freq: Once | INTRAMUSCULAR | Status: AC
  Administered 2016-04-10: 30 mg via INTRAVENOUS

## 2016-04-10 MED ORDER — KETOROLAC TROMETHAMINE 10 MG PO TABS: 10.0000 mg | ORAL_TABLET | Freq: Four times a day (QID) | ORAL | 0 refills | Status: DC | PRN

## 2016-04-10 NOTE — Progress Notes (Signed)
   Patient is a 31 year old gravida 2 para 2 (one vaginal delivery one cesarean section) is here for insertion of Mirena IUD. Patient stated she had a Mirena IUD placed for 7 years and had it removed by her PCP in February this year but was unable to insert the new IUD. This is the reason for the referral today. Patient's currently on her menstrual cycle. She states her Pap smear her blood work all up-to-date as well as her flu vaccine.                                                                    IUD procedure note       Patient presented to the office today for placement of Mirena IUD. The patient had previously been provided with literature information on this method of contraception. The risks benefits and pros and cons were discussed and all her questions were answered. She is fully aware that this form of contraception is 99% effective and is good for 5 years.  Pelvic exam: Bartholin urethra Skene glands: Within normal limits Vagina: No lesions or discharge Cervix: No lesions or discharge Uterus: Anteverted position Adnexa: No masses or tenderness Rectal exam: Not done  The cervix was cleansed with Betadine solution. A single-tooth tenaculum was placed on the anterior cervical lip. The uterus sounded to 7-1/2 centimeter. The IUD was shown to the patient and inserted in a sterile fashion. The IUD string was trimmed. The single-tooth tenaculum was removed. Patient was instructed to return back to the office in one month for follow up.     Because of the cramping patient was given 30 mg of Toradol IM and a prescription for Toradol 10 mg take 1 by mouth every 6 hours for the next 4-5 days when necessary.   Mirena lot number: [id number]

## 2016-04-10 NOTE — Patient Instructions (Signed)

## 2016-04-10 NOTE — Addendum Note (Signed)
Addended by: Kem ParkinsonBARNES, Toniesha Zellner on: 04/10/2016 12:51 PM   Modules accepted: Orders

## 2016-04-16 ENCOUNTER — Encounter: Payer: Self-pay | Admitting: Gynecology

## 2016-04-23 ENCOUNTER — Other Ambulatory Visit (INDEPENDENT_AMBULATORY_CARE_PROVIDER_SITE_OTHER): Payer: BLUE CROSS/BLUE SHIELD

## 2016-04-23 DIAGNOSIS — E1065 Type 1 diabetes mellitus with hyperglycemia: Secondary | ICD-10-CM

## 2016-04-23 DIAGNOSIS — E7800 Pure hypercholesterolemia, unspecified: Secondary | ICD-10-CM

## 2016-04-23 LAB — COMPREHENSIVE METABOLIC PANEL
ALT: 13 U/L (ref 0–35)
AST: 14 U/L (ref 0–37)
Albumin: 4.3 g/dL (ref 3.5–5.2)
Alkaline Phosphatase: 76 U/L (ref 39–117)
BILIRUBIN TOTAL: 0.3 mg/dL (ref 0.2–1.2)
BUN: 6 mg/dL (ref 6–23)
CALCIUM: 9 mg/dL (ref 8.4–10.5)
CO2: 30 meq/L (ref 19–32)
CREATININE: 0.63 mg/dL (ref 0.40–1.20)
Chloride: 101 mEq/L (ref 96–112)
GFR: 116.32 mL/min (ref >60.00)
GLUCOSE: 271 mg/dL — AB (ref 70–99)
Potassium: 4.2 mEq/L (ref 3.5–5.1)
Sodium: 137 mEq/L (ref 135–145)
Total Protein: 6.5 g/dL (ref 6.0–8.3)

## 2016-04-23 LAB — URINALYSIS, ROUTINE W REFLEX MICROSCOPIC
Bilirubin Urine: NEGATIVE
Leukocytes, UA: NEGATIVE
Nitrite: NEGATIVE
PH: 5.5 (ref 5.0–8.0)
Specific Gravity, Urine: 1.02 (ref 1.000–1.030)
TOTAL PROTEIN, URINE-UPE24: NEGATIVE
Urine Glucose: >=1000 — AB
Urobilinogen, UA: 0.2 (ref 0.0–1.0)

## 2016-04-23 LAB — LIPID PANEL
CHOL/HDL RATIO: 3
Cholesterol: 128 mg/dL (ref 0–200)
HDL: 46.1 mg/dL (ref >39.00)
LDL Cholesterol: 51 mg/dL (ref 0–99)
NONHDL: 81.98
Triglycerides: 153 mg/dL — ABNORMAL HIGH (ref 0.0–149.0)
VLDL: 30.6 mg/dL (ref 0.0–40.0)

## 2016-04-23 LAB — MICROALBUMIN / CREATININE URINE RATIO
CREATININE, U: 75.9 mg/dL
MICROALB/CREAT RATIO: 8.3 mg/g (ref 0.0–30.0)
Microalb, Ur: 6.3 mg/dL — ABNORMAL HIGH (ref 0.0–1.9)

## 2016-04-23 LAB — HEMOGLOBIN A1C: HEMOGLOBIN A1C: 7.3 % — AB (ref 4.6–6.5)

## 2016-04-26 ENCOUNTER — Ambulatory Visit: Payer: BLUE CROSS/BLUE SHIELD | Admitting: Endocrinology

## 2016-04-30 ENCOUNTER — Ambulatory Visit (INDEPENDENT_AMBULATORY_CARE_PROVIDER_SITE_OTHER): Payer: BLUE CROSS/BLUE SHIELD | Admitting: Endocrinology

## 2016-04-30 ENCOUNTER — Encounter: Payer: Self-pay | Admitting: Endocrinology

## 2016-04-30 VITALS — BP 112/78 | HR 90 | Ht 64.0 in | Wt 146.0 lb

## 2016-04-30 DIAGNOSIS — E1065 Type 1 diabetes mellitus with hyperglycemia: Secondary | ICD-10-CM | POA: Diagnosis not present

## 2016-04-30 NOTE — Patient Instructions (Signed)
Low fat meals 

## 2016-04-30 NOTE — Progress Notes (Signed)
Patient ID: Sierra Schneider, female   DOB: [birth year], 31 y.o.   MRN: [id number]   Reason for Appointment: Diabetes followup:   History of Present Illness   Diagnosis: Type 1 DIABETES MELITUS     DIABETES history: She has had diabetes since about 2011 and has been on insulin pump for several years She had fairly good control in the first 2 years of her disease but blood sugar has been more difficult subsequently  CURRENT insulin pump:  Medtronic 670 G  SETTINGS are:    Basal rate on current download = Midnight = 1.2, 7 AM = 1.4, 10 AM = 0.6, 12 noon = 1.3, 6 PM = 1.05 and 7 PM = 0.9  Total basal insulin = 23 .5 units Carb Ratio: 1:10. Correction factor 1: 30, target 100  Her A1c was last 7.1% and is now 7.3    Current management, blood sugar patterns and problems identified:  She has been the AUTO mode for 73% of the time in the last 2 weeks  Her blood sugars are overall averaging 163+/-51, previous 2 weeks 141+/-56 as judged by her sensor  She is having difficulties with her sensor and has been on an average using it only 77% of the time; she said that she is having are messages when this her sensor is updating and she has to change her sensor more frequently, has not been upper to resolve the issue with support  HYPERGLYCEMIA appears to be occurring mostly late morning and also around 4-5 PM  However she is still having quite a bit of variability, mostly in the mid afternoons and some late at night also  Her blood sugars have been in the TARGET range 65% of the time, previously 72% and has 27% readings between 180-2 50 and 7% readings over 250  HYPOGLYCEMIA is occurring sporadically either around 3:30-4 PM or around 6 PM, the couple of times this has been from mealtime boluses either immediately or a couple about later  LUNCH meals are generally high fat such as hamburger and fries and blood sugars are tending to be variably high later in the afternoon based on  her diet, she does try to add extra boluses for this; her pump has exited from the auto mode because of Max delivery on 4 occasions  On an average however his POSTPRANDIAL readings are and 80 after lunch and 193 after dinner; there is significant variability after meals however  Usually not eating breakfast now, she says that her appetite is decreased  Currently not exercising    Wt Readings from Last 3 Encounters:  04/30/16 146 lb (66.2 kg)  04/10/16 150 lb (68 kg)  04/05/16 150 lb 6.4 oz (68.2 kg)     LABS:  Lab Results  Component Value Date   HGBA1C 7.3 (H) 04/23/2016   HGBA1C 7.1 (H) 01/24/2016   HGBA1C 8.0 (H) 10/26/2015   Lab Results  Component Value Date   MICROALBUR 6.3 (H) 04/23/2016   LDLCALC 51 04/23/2016   CREATININE 0.63 04/23/2016    OTHER active problems discussed today: See review of systems    Allergies as of 04/30/2016   No Known Allergies     Medication List       Accurate as of 04/30/16  4:19 PM. Always use your most recent med list.          cetirizine 10 MG tablet Commonly known as:  ZYRTEC Take 1 tablet (10 mg total) by mouth daily.  DULoxetine 60 MG capsule Commonly known as:  CYMBALTA Take 1 capsule (60 mg total) by mouth daily.   DULoxetine 30 MG capsule Commonly known as:  CYMBALTA TAKE 1 CAPSULE BY MOUTH EVERY DAY TAKE WITH 60MG  CAPSULE   fluticasone 50 MCG/ACT nasal spray Commonly known as:  FLONASE Place 2 sprays into both nostrils daily.   gabapentin 100 MG capsule Commonly known as:  NEURONTIN Take 1 capsule (100 mg total) by mouth 3 (three) times daily.   ketorolac 10 MG tablet Commonly known as:  TORADOL Take 1 tablet (10 mg total) by mouth every 6 (six) hours as needed.   Melatonin 1 MG Caps Take 1 capsule (1 mg total) by mouth at bedtime.   NOVOLOG 100 UNIT/ML injection Generic drug:  insulin aspart INJECT 80 UNITS PER INSULIN PUMP MAX DAILY.   oseltamivir 75 MG capsule Commonly known as:  TAMIFLU Take 1  capsule (75 mg total) by mouth daily.   rosuvastatin 10 MG tablet Commonly known as:  CRESTOR TAKE 1 TABLET (10 MG TOTAL) BY MOUTH DAILY.       Allergies: No Known Allergies  Past Medical History:  Diagnosis Date  . Diabetes mellitus 08/2009   late onset type 1    Past Surgical History:  Procedure Laterality Date  . CESAREAN SECTION    . ESOPHAGOGASTRODUODENOSCOPY Left 10/14/2012   Procedure: ESOPHAGOGASTRODUODENOSCOPY (EGD);  Surgeon: Vertell NovakJames L Edwards Jr., MD;  [facility];  Service: Endoscopy;  Laterality: Left;  . NO PAST SURGERIES      Family History  Problem Relation Age of Onset  . Depression Mother   . Depression Father   . Kidney disease Paternal Grandmother   . Diabetes Maternal Grandfather   . Heart disease Neg Hx     Social History:  reports that she quit smoking about 2 years ago. She smoked 0.00 packs per day. She has never used smokeless tobacco. She reports that she drinks alcohol. She reports that she does not use drugs.  REVIEW of systems:   She has had mild HYPERCHOLESTEROLEMIA,  Because of her family history of hypercholesterolemia and her diabetes she was previously on pravastatin, this was switched to Crestor because of LDL of 146  LDL improved as follows:  Lab Results  Component Value Date   CHOL 128 04/23/2016   CHOL 149 08/25/2015   CHOL 214 (H) 07/11/2015   Lab Results  Component Value Date   HDL 46.10 04/23/2016   HDL 53.20 08/25/2015   HDL 41.60 07/11/2015   Lab Results  Component Value Date   LDLCALC 51 04/23/2016   LDLCALC 81 08/25/2015   LDLCALC 146 (H) 07/11/2015   Lab Results  Component Value Date   TRIG 153.0 (H) 04/23/2016   TRIG 71.0 08/25/2015   TRIG 132.0 07/11/2015   Lab Results  Component Value Date   CHOLHDL 3 04/23/2016   CHOLHDL 3 08/25/2015   CHOLHDL 5 07/11/2015   Lab Results  Component Value Date   LDLDIRECT 129.4 12/29/2012        EXAM:  BP 112/78   Pulse 90   Ht 5\' 4"  (1.626  m)   Wt 146 lb (66.2 kg)   LMP 04/09/2016 (Approximate)   SpO2 96%   BMI 25.06 kg/m   She has mild athlete's foot on her toes particularly on the plantar surface and in between the toes No pedal edema  ASSESSMENT:    See history of present illness for detailed discussion of  current management, blood  sugar patterns and problems identified  Her blood sugar control is overall still difficult to control Although her A1c is really good at 7.3 she is more recently getting higher readings continuing to improve and her A1c is now 7.1 She has been in the auto mode recently about 78% the time However she does seem to have continued variability and unexpected hypoglycemia as well as some rebound from low sugars With her decreased appetite and occasionally skipping meals this may be potentially causing lower sugars but eating better Currently her BOLUS amount is only 34% of her daily insulin POSTPRANDIAL blood sugars are not consistently high Hypoglycemia has been occurring rarely overnight unexpectedly also but she is able to alert herself and treatment is accordingly  Day-to-day management of her diabetes, diet and boluses were discussed  History of hypercholesterolemia: Very well controlled, LDL is significantly low with 10 mg Crestor and she will continue  Recommendations:    She will try to reduce her high fat meals at lunch to make it easier to control her blood sugars and avoid variability in the afternoon, this is also tending to have higher readings later afternoon, has exited from the auto mode Max delivery on 4 occasions  She will also discuss issues with her sensor not being as functional with Medtronic  Also she was tried on [place] her pump before she comes in with the help of Medtronic  Will not change carbohydrate ratio as yet since she does not have any consistent patterns currently continue the 1:10 carbohydrate ratio and also no change in active insulin duration  More  regular exercise  She will call if she continues to have excessive hyperglycemia    Patient Instructions  Low fat meals    Counseling time on subjects discussed above is over 50% of today's 25 minute visit   [place] Urieta 04/30/2016, 4:19 PM

## 2016-05-07 ENCOUNTER — Encounter: Payer: Self-pay | Admitting: Family Medicine

## 2016-05-07 ENCOUNTER — Ambulatory Visit (INDEPENDENT_AMBULATORY_CARE_PROVIDER_SITE_OTHER): Payer: BLUE CROSS/BLUE SHIELD | Admitting: Family Medicine

## 2016-05-07 ENCOUNTER — Ambulatory Visit: Payer: BLUE CROSS/BLUE SHIELD | Admitting: Gynecology

## 2016-05-07 ENCOUNTER — Encounter: Payer: Self-pay | Admitting: Psychology

## 2016-05-07 VITALS — BP 110/82 | HR 85 | Temp 98.2°F | Ht 64.0 in | Wt 147.6 lb

## 2016-05-07 DIAGNOSIS — R6882 Decreased libido: Secondary | ICD-10-CM

## 2016-05-07 DIAGNOSIS — R5383 Other fatigue: Secondary | ICD-10-CM | POA: Diagnosis not present

## 2016-05-07 DIAGNOSIS — F329 Major depressive disorder, single episode, unspecified: Secondary | ICD-10-CM | POA: Diagnosis not present

## 2016-05-07 DIAGNOSIS — F32A Depression, unspecified: Secondary | ICD-10-CM

## 2016-05-07 LAB — TSH: TSH: 1.26 m[IU]/L

## 2016-05-07 LAB — POCT HEMOGLOBIN: HEMOGLOBIN: 14.2 g/dL (ref 12.2–16.2)

## 2016-05-07 MED ORDER — BUPROPION HCL ER (XL) 150 MG PO TB24: 150.0000 mg | ORAL_TABLET | Freq: Every day | ORAL | 2 refills | Status: DC

## 2016-05-07 MED ORDER — DULOXETINE HCL 30 MG PO CPEP: ORAL_CAPSULE | ORAL | 0 refills | Status: DC

## 2016-05-07 NOTE — Progress Notes (Unsigned)
Dr. Jonathon JordanGambino requested a Behavioral Health Consult.   Presenting Issue:  Low sex drive, depression, and anxiety  Report of symptoms:  Patient's sex drive is decreasing, doesn't experience sensation during sex. Patient is tired and sleeps approximately 6 hours a night with frequent waking.   Duration of CURRENT symptoms:  Worsening in last few weeks but has been a chronic problem throughout her life (as has anxiety and depression). Lack of sensation during sex is new.  Age of onset of first mood disturbance:  Early teens (depression)  Impact on function:  Fiance is understanding but has expressed frustration with patient's sex drive. Depressive and anxiety relatively well managed by Cymbalta and coping strategies, but patient feels tired often   Psychiatric History - Diagnoses: Depression, anxiety symptoms - Hospitalizations: None - Pharmacotherapy: Zoloft worked well in past, has been supplemented with Buspar, patient doesn't remember names but has tried other anti-depressants - Outpatient therapy: Counseling with mother at age of 31, helpful for "adolescent problems". None since  Family history of psychiatric issues:    Current and history of substance use:  None  Medical conditions that might explain or contribute to symptoms:  Type I diabetes, wears sensors that beep during the night that sometimes make it more difficult to fall back to sleep because expecting the noise.  PHQ-9:  11 GAD-7:  MDQ (if indicated):    Assessment / Plan / Recommendations: Patient experiencing low sex drive (primary concern) and interested in trying new medication as reports cymbalta is helpful but not as helpful as zoloft was in the past. She states that zoloft stopped working after several years and no medication has worked as well. Relationship with fiance is positive.Patient is often fatigued and has trouble staying asleep at night. Melatonin has been helpful for falling asleep however. Though patient's  anxiety and depression is relatively well-controlled, she is under stress from work and busy lifestyle. She has some healthy coping strategies: speaking to mom on the phone, take a minute to reflect, but also smokes approximately 2 packs a week only at work to help relax. [facility] taught deep breathing which patient appreciated as way to cope at work. [facility] weeks to follow-up on referrals.  Warmhandoff:    Warm Hand Off Completed.

## 2016-05-07 NOTE — Progress Notes (Signed)
Subjective:    Patient ID: Sierra Schneider , female   DOB: 1985/07/04 , 31 y.o..   MRN: [id number]  HPI  Sierra Schneider is here for  Chief Complaint  Patient presents with  . Fatigue    1. Depression, fatigue and decreased sex drive: Patient reports over the last 2 years she has been fatigued and had decreased sex drive. She notes that over the last couple months this has gotten worse. She notes that she is happy in her relationship with her fianc and she doesn't think that there are any problems there. She notes that she doesn't have the desire to have sex and does not enjoy sex but she lets her fianc. She explains that she has to drink multiple cups of coffee during the day just stay awake and alert and take care of her children, she also has a full-time job. She notes that she has not gotten the best sleep as she wakes up throughout the night due to her diabetes monitor but the melatonin does help her fall asleep. She admits to depression and notes that she doesn't feel like the Cymbalta is helping as it was before. She denies any suicidal or homicidal ideation and denies a passive death wish. She mostly just has a an underlying depression that she states has been present for so long that she is just use to. She does not exercise.  Review of Systems: Per HPI. All other systems reviewed and are negative.   Past Medical History: Patient Active Problem List   Diagnosis Date Noted  . IUD (intrauterine device) in place 04/10/2016  . Insomnia 03/20/2016  . General counseling and advice on female contraception 03/20/2016  . Idiopathic edema 05/10/2015  . Recurrent sinusitis 10/23/2014  . Hidradenitis axillaris 03/16/2014  . Generalized anxiety disorder 06/29/2013  . Pure hypercholesterolemia 01/03/2013  . Depression 12/01/2010  . Uncontrolled type 1 diabetes mellitus (HCC) 09/14/2009    Medications: reviewed   Social Hx:  reports that she quit smoking about 2 years ago. She  smoked 0.00 packs per day. She has never used smokeless tobacco.   Objective:   BP 110/82   Pulse 85   Temp 98.2 F (36.8 C) (Oral)   Ht 5\' 4"  (1.626 m)   Wt 147 lb 9.6 oz (67 kg)   LMP 04/09/2016 (Approximate)   SpO2 97%   BMI 25.34 kg/m  Physical Exam  Gen: NAD, alert, cooperative with exam, well-appearing Cardiac: Regular rate and rhythm Respiratory: Normal work of breathing Psych: good insight, normal mood and affect  Assessment & Plan:  Depression Uncontrolled. No suicidal or homicidal ideation. Associated with fatigue and decreased sex drive. Suspect that the fatigue and decreased sex drive is associated with her depression. Decreased sex drive could also be due to Cymbalta as it started around the same time that she started Cymbalta. Unlikely that it is due to a poor relationship with her fianc as she notes that she feels like she has a healthy relationship. Asked behavioral health consultant to see patient during her visit today, appreciate his assistance (please see his separate note). Cymbalta is not working for her anymore, I suggested she see a psychiatrist but in the meantime we can try weaning off Cymbalta and starting Wellbutrin. -Discussed weaning off Cymbalta and starting Wellbutrin 150 mg daily, see AVS for specific instructions -She should finish weaning off Cymbalta by April 9 and follow-up with me that week -If she continues to have trouble sleeping may consider  starting trazodone next line -Check hemoglobin and TSH today -Return precautions discussed   Orders Placed This Encounter  Procedures  . TSH  . POCT hemoglobin   Meds ordered this encounter  Medications  . buPROPion (WELLBUTRIN XL) 150 MG 24 hr tablet    Sig: Take 1 tablet (150 mg total) by mouth daily.    Dispense:  30 tablet    Refill:  2  . DULoxetine (CYMBALTA) 30 MG capsule    Sig: TAKE 1 CAPSULE BY MOUTH EVERY DAY TAKE WITH 60MG  CAPSULE    Dispense:  60 capsule    Refill:  0     Anders Simmonds, MD [facility], PGY-2

## 2016-05-07 NOTE — Patient Instructions (Addendum)
Thank you for coming in today, it was so nice to see you! Today we talked about:    Fatigue and decreased sex drive: I think it would be best to try and transition you off of Cymbalta and try something else. I still recommend to see a psychiatrist in the future although I know this is difficult as there scheduling is very a packed.   We will check your hormones and blood today and make sure that is normal  Try and do a minimal of 15-20 minutes of physical activity at least 3 times a week, this will increase your energy level and improved sex drive over time  The plan for your depression medication is as follows:  Week 1 (starting tomorrow): Take 90 mg of Cymbalta daily and 150 mg of Wellbutrin daily  Week 2: Take 60 mg of Cymbalta daily and 150 mg a Wellbutrin daily  Week 3: Take 30 mg of Cymbalta daily and 150 mg of Wellbutrin daily  Week 4: No Cymbalta. Take 150 mg of Wellbutrin daily. Follow up with me in clinic during this week which will be the week of April 9th  Sleep: Continue melatonin at night. If I see you in 4 weeks and you are still having trouble sleeping, we can discuss trying another medication  Please follow up in 4 weeks. You can schedule this appointment at the front desk before you leave or call the clinic.  If we ordered any tests today, you will be notified via telephone of any abnormalities. If everything is normal you will get a letter in the mail.   If you have any questions or concerns, please do not hesitate to call the office at [phone]. You can also message me directly via MyChart.   Sincerely,  Anders Simmondshristina Gambino, MD

## 2016-05-07 NOTE — Assessment & Plan Note (Addendum)
Uncontrolled. No suicidal or homicidal ideation. Associated with fatigue and decreased sex drive. Suspect that the fatigue and decreased sex drive is associated with her depression. Decreased sex drive could also be due to Cymbalta as it started around the same time that she started Cymbalta. Unlikely that it is due to a poor relationship with her fianc as she notes that she feels like she has a healthy relationship. Asked behavioral health consultant to see patient during her visit today, appreciate his assistance (please see his separate note). Cymbalta is not working for her anymore, I suggested she see a psychiatrist but in the meantime we can try weaning off Cymbalta and starting Wellbutrin. -Discussed weaning off Cymbalta and starting Wellbutrin 150 mg daily, see AVS for specific instructions -She should finish weaning off Cymbalta by April 9 and follow-up with me that week -If she continues to have trouble sleeping may consider starting trazodone next line -Check hemoglobin and TSH today -Return precautions discussed

## 2016-05-10 ENCOUNTER — Encounter: Payer: Self-pay | Admitting: Family Medicine

## 2016-06-02 ENCOUNTER — Other Ambulatory Visit: Payer: Self-pay | Admitting: Family Medicine

## 2016-06-06 ENCOUNTER — Ambulatory Visit (INDEPENDENT_AMBULATORY_CARE_PROVIDER_SITE_OTHER): Payer: BLUE CROSS/BLUE SHIELD | Admitting: Family Medicine

## 2016-06-06 ENCOUNTER — Encounter: Payer: Self-pay | Admitting: Family Medicine

## 2016-06-06 DIAGNOSIS — F329 Major depressive disorder, single episode, unspecified: Secondary | ICD-10-CM | POA: Diagnosis not present

## 2016-06-06 DIAGNOSIS — F32A Depression, unspecified: Secondary | ICD-10-CM

## 2016-06-06 MED ORDER — BUPROPION HCL ER (XL) 300 MG PO TB24: 300.0000 mg | ORAL_TABLET | Freq: Every day | ORAL | 3 refills | Status: DC

## 2016-06-06 MED ORDER — ONDANSETRON HCL 4 MG PO TABS: 4.0000 mg | ORAL_TABLET | Freq: Three times a day (TID) | ORAL | 0 refills | Status: DC | PRN

## 2016-06-06 NOTE — Progress Notes (Signed)
   Subjective:    Patient ID: Sierra Schneider , female   DOB: January 21, 1986 , 31 y.o..   MRN: [id number]  HPI  Sierra Schneider is here for  Chief Complaint  Patient presents with  . Depression    1. Follow up for depression: patient is here for follow-up. Last month we weaned her off the Cymbalta and started Wellbutrin. Patient notes that since the Wellbutrin was started she has had nausea and vomiting. The last time that she vomited was 6 days ago. She has been taking Dramamine intermittently to try to improve her symptoms. She notes that her urge to smoke is  She has felt more moody than usual, even more so when she was on the  She has felt more depressed and he decreased sex drive remains unchanged. She denies any suicidal or homicidal. She endorses poor sleep habits but denies staying up for days at a time Denies spending an excessive amount of money. Denies ever feeling increased energy.  Review of Systems: Per HPI. All other systems reviewed and are negative.  Medications: reviewed and updated  Social Hx:  reports that she quit smoking about 2 years ago. She smoked 0.00 packs per day. She has never used smokeless tobacco.   Objective:   BP 108/76   Pulse 93   Temp 98.1 F (36.7 C) (Oral)   Ht  (1.626 m)   Wt 150 lb (68 kg)   SpO2 98%   BMI 25.75 kg/m   Physical Exam  ROS: admits to depression, no anxiety  Gen: NAD, alert, cooperative with exam, well-appearing Psych: good eye contact, speech is slightly more rapid than previous, normal mood with one tearful episode, appropriate affect, normal thought process, no suicidal or homicidal ideations   Assessment & Plan:  Depression Uncontrolled. No homicidal or suicidal ideation. PHQ9 is 14. Fully tapered off of Cymbalta and now on Wellbutrin 150 mg daily. Patient's speech is slightly more rapid than previous exam however does not admit to any manic symptoms. -Increase Wellbutrin to 300 mg daily -Provided short-term  prescription for Zofran for severe nausea or vomiting -Patient notes she has appointment with psychiatry in 2 weeks, follow their recommendations for further psychiatric medication management -Return precautions discussed  Meds ordered this encounter  Medications  . buPROPion (WELLBUTRIN XL) 300 MG 24 hr tablet    Sig: Take 1 tablet (300 mg total) by mouth daily.    Dispense:  30 tablet    Refill:  3  . ondansetron (ZOFRAN) 4 MG tablet    Sig: Take 1 tablet (4 mg total) by mouth every 8 (eight) hours as needed for nausea or vomiting.    Dispense:  20 tablet    Refill:  0    Sierra Simmonds, MD [facility], PGY-2

## 2016-06-06 NOTE — Assessment & Plan Note (Addendum)
Uncontrolled. No homicidal or suicidal ideation. PHQ9 is 14. Fully tapered off of Cymbalta and now on Wellbutrin 150 mg daily. Patient's speech is slightly more rapid than previous exam however does not admit to any manic symptoms. -Increase Wellbutrin to 300 mg daily -Provided short-term prescription for Zofran for severe nausea or vomiting -Patient notes she has appointment with psychiatry in 2 weeks, follow their recommendations for further psychiatric medication management -Return precautions discussed

## 2016-06-06 NOTE — Patient Instructions (Signed)
Thank you for coming in today, it was so nice to see you! Today we talked about:    Mood: We will increase your Wellbutrin to 300 mg daily. I have sent a prescription in to your pharmacy. I'm glad that you've an appointment with the psychiatrist. I look forward to coordinating her care with them. I have sent a prescription for Zofran to help with any nausea or vomiting. This medication should only be used sparingly. If you continue to have nausea and vomiting after the next couple weeks, please let me know.  If you have any questions or concerns, please do not hesitate to call the office at [phone]. You can also message me directly via MyChart.   Sincerely,  Anders Simmonds, MD

## 2016-06-14 ENCOUNTER — Encounter: Payer: Self-pay | Admitting: Family Medicine

## 2016-06-18 ENCOUNTER — Telehealth: Payer: Self-pay | Admitting: Family Medicine

## 2016-06-18 ENCOUNTER — Ambulatory Visit (HOSPITAL_COMMUNITY): Payer: BLUE CROSS/BLUE SHIELD | Admitting: Psychiatry

## 2016-06-18 NOTE — Telephone Encounter (Signed)
Pt stated appointment with psychiatrist had to be rescheduled and wanted to know if PCP would be able to call in something until then. [facility] uses [facility] [address]. ep

## 2016-06-18 NOTE — Telephone Encounter (Signed)
Will forward to MD to advise. Promise Bushong,CMA  

## 2016-06-19 NOTE — Telephone Encounter (Signed)
Patient messaged me on MyChart about this. I returned her message there. Thank you.

## 2016-06-26 ENCOUNTER — Telehealth: Payer: Self-pay | Admitting: Endocrinology

## 2016-06-26 ENCOUNTER — Other Ambulatory Visit: Payer: Self-pay | Admitting: Endocrinology

## 2016-06-26 ENCOUNTER — Encounter: Payer: Self-pay | Admitting: Obstetrics and Gynecology

## 2016-06-26 ENCOUNTER — Ambulatory Visit (INDEPENDENT_AMBULATORY_CARE_PROVIDER_SITE_OTHER): Payer: BLUE CROSS/BLUE SHIELD | Admitting: Obstetrics and Gynecology

## 2016-06-26 VITALS — BP 100/70 | HR 102 | Temp 98.5°F | Wt 154.0 lb

## 2016-06-26 DIAGNOSIS — R05 Cough: Secondary | ICD-10-CM

## 2016-06-26 DIAGNOSIS — R058 Other specified cough: Secondary | ICD-10-CM

## 2016-06-26 DIAGNOSIS — J3089 Other allergic rhinitis: Secondary | ICD-10-CM | POA: Diagnosis not present

## 2016-06-26 DIAGNOSIS — J0110 Acute frontal sinusitis, unspecified: Secondary | ICD-10-CM | POA: Diagnosis not present

## 2016-06-26 MED ORDER — BENZONATATE 200 MG PO CAPS: 200.0000 mg | ORAL_CAPSULE | Freq: Three times a day (TID) | ORAL | 0 refills | Status: DC | PRN

## 2016-06-26 MED ORDER — AMOXICILLIN-POT CLAVULANATE 875-125 MG PO TABS: 1.0000 | ORAL_TABLET | Freq: Two times a day (BID) | ORAL | 0 refills | Status: AC

## 2016-06-26 NOTE — Telephone Encounter (Signed)
Patient stated the pharmacy told her to call our office, they wouldn't refill her prescription.  Please advise  She is almost out.

## 2016-06-26 NOTE — Progress Notes (Signed)
   Subjective:   Patient ID: Sierra Schneider, female    DOB: [birth year], 31 y.o.   MRN: [id number]  Patient presents for Same Day Appointment  Chief Complaint  Patient presents with  . Sinus Problem    HPI: # Sinus Concerns Sick over a week now Tried zyrtec for allergies but not helping Congestion mostly Cough that is hurting chest to cough Dry cough that wont go away Ears are hurting as well and thinks she may have ear infection No dyspnea No sick contacts No fevers recorded but has felt warm Has had sinus infection before Works with children  Review of Systems   See HPI for ROS.   History  Smoking Status  . Former Smoker  . Packs/day: 0.00  . Quit date: 01/26/2014  Smokeless Tobacco  . Never Used    Past medical history, surgical, family, and social history reviewed and updated in the EMR as appropriate.  Pertinent Historical Findings include: Objective:  BP 100/70   Pulse (!) 102   Temp 98.5 F (36.9 C) (Oral)   Wt 154 lb (69.9 kg)   SpO2 99%   BMI 26.43 kg/m  Vitals and nursing note reviewed  Physical Exam  Constitutional: She is well-developed, well-nourished, and in no distress.  HENT:  Right Ear: Tympanic membrane, external ear and ear canal normal.  Left Ear: Tympanic membrane, external ear and ear canal normal.  Nose: Mucosal edema present.  Mouth/Throat: Oropharynx is clear and moist.  Eyes: Conjunctivae and EOM are normal. Pupils are equal, round, and reactive to light.  Neck: Normal range of motion. Neck supple.  Cardiovascular: Normal rate, regular rhythm and normal heart sounds.   Pulmonary/Chest: Effort normal and breath sounds normal.  Lymphadenopathy:    She has no cervical adenopathy.    Assessment & Plan:  1. Post-viral cough syndrome Patient with URI symptoms. This are kind of resolving but with persistent cough now. Discussed diagnoses of post-viral cough with patient. Rx given for tessalon.   2. Environmental and seasonal  allergies Continue allergy medications. This appears to be playing a part in her symptoms as well.   3. Acute frontal sinusitis, recurrence not specified h/o recurrent sinusitis. May be developing an early sinus infection. At this point think it is just a viral illness. Will give patient an Rx for Augmentin if symptoms not improved by Friday can get antibiotics.   Diagnosis and plan along with any newly prescribed medication(s) were discussed in detail with this patient today. The patient verbalized understanding and agreed with the plan. Patient advised if symptoms worsen return to clinic.   PATIENT EDUCATION PROVIDED: See AVS   Caryl Ada, DO 06/26/2016, 4:24 PM PGY-3, [facility]

## 2016-06-26 NOTE — Patient Instructions (Signed)
Upper Respiratory Infection, Adult Most upper respiratory infections (URIs) are a viral infection of the air passages leading to the lungs. A URI affects the nose, throat, and upper air passages. The most common type of URI is nasopharyngitis and is typically referred to as "the common cold." URIs run their course and usually go away on their own. Most of the time, a URI does not require medical attention, but sometimes a bacterial infection in the upper airways can follow a viral infection. This is called a secondary infection. Sinus and middle ear infections are common types of secondary upper respiratory infections. Bacterial pneumonia can also complicate a URI. A URI can worsen asthma and chronic obstructive pulmonary disease (COPD). Sometimes, these complications can require emergency medical care and may be life threatening. What are the causes? Almost all URIs are caused by viruses. A virus is a type of germ and can spread from one person to another. What increases the risk? You may be at risk for a URI if:  You smoke.  You have chronic heart or lung disease.  You have a weakened defense (immune) system.  You are very young or very old.  You have nasal allergies or asthma.  You work in crowded or poorly ventilated areas.  You work in health care facilities or schools.  What are the signs or symptoms? Symptoms typically develop 2-3 days after you come in contact with a cold virus. Most viral URIs last 7-10 days. However, viral URIs from the influenza virus (flu virus) can last 14-18 days and are typically more severe. Symptoms may include:  Runny or stuffy (congested) nose.  Sneezing.  Cough.  Sore throat.  Headache.  Fatigue.  Fever.  Loss of appetite.  Pain in your forehead, behind your eyes, and over your cheekbones (sinus pain).  Muscle aches.  How is this diagnosed? Your health care provider may diagnose a URI by:  Physical exam.  Tests to check that your  symptoms are not due to another condition such as: ? Strep throat. ? Sinusitis. ? Pneumonia. ? Asthma.  How is this treated? A URI goes away on its own with time. It cannot be cured with medicines, but medicines may be prescribed or recommended to relieve symptoms. Medicines may help:  Reduce your fever.  Reduce your cough.  Relieve nasal congestion.  Follow these instructions at home:  Take medicines only as directed by your health care provider.  Gargle warm saltwater or take cough drops to comfort your throat as directed by your health care provider.  Use a warm mist humidifier or inhale steam from a shower to increase air moisture. This may make it easier to breathe.  Drink enough fluid to keep your urine clear or pale yellow.  Eat soups and other clear broths and maintain good nutrition.  Rest as needed.  Return to work when your temperature has returned to normal or as your health care provider advises. You may need to stay home longer to avoid infecting others. You can also use a face mask and careful hand washing to prevent spread of the virus.  Increase the usage of your inhaler if you have asthma.  Do not use any tobacco products, including cigarettes, chewing tobacco, or electronic cigarettes. If you need help quitting, ask your health care provider. How is this prevented? The best way to protect yourself from getting a cold is to practice good hygiene.  Avoid oral or hand contact with people with cold symptoms.  Wash your   hands often if contact occurs.  There is no clear evidence that vitamin C, vitamin E, echinacea, or exercise reduces the chance of developing a cold. However, it is always recommended to get plenty of rest, exercise, and practice good nutrition. Contact a health care provider if:  You are getting worse rather than better.  Your symptoms are not controlled by medicine.  You have chills.  You have worsening shortness of breath.  You have  brown or red mucus.  You have yellow or brown nasal discharge.  You have pain in your face, especially when you bend forward.  You have a fever.  You have swollen neck glands.  You have pain while swallowing.  You have white areas in the back of your throat. Get help right away if:  You have severe or persistent: ? Headache. ? Ear pain. ? Sinus pain. ? Chest pain.  You have chronic lung disease and any of the following: ? Wheezing. ? Prolonged cough. ? Coughing up blood. ? A change in your usual mucus.  You have a stiff neck.  You have changes in your: ? Vision. ? Hearing. ? Thinking. ? Mood. This information is not intended to replace advice given to you by your health care provider. Make sure you discuss any questions you have with your health care provider. Document Released: 08/08/2000 Document Revised: 10/16/2015 Document Reviewed: 05/20/2013 Elsevier Interactive Patient Education  2017 [employer]  

## 2016-06-26 NOTE — Telephone Encounter (Signed)
I contacted the patient and advised rx for novolog and crestor has [facility] on [address]. Patient voiced understanding.

## 2016-07-05 ENCOUNTER — Encounter (HOSPITAL_COMMUNITY): Payer: Self-pay | Admitting: Psychiatry

## 2016-07-05 ENCOUNTER — Ambulatory Visit (INDEPENDENT_AMBULATORY_CARE_PROVIDER_SITE_OTHER): Payer: BLUE CROSS/BLUE SHIELD | Admitting: Psychiatry

## 2016-07-05 VITALS — BP 120/68 | HR 90 | Ht 64.0 in | Wt 150.0 lb

## 2016-07-05 DIAGNOSIS — Z87891 Personal history of nicotine dependence: Secondary | ICD-10-CM | POA: Diagnosis not present

## 2016-07-05 DIAGNOSIS — Z818 Family history of other mental and behavioral disorders: Secondary | ICD-10-CM

## 2016-07-05 DIAGNOSIS — F411 Generalized anxiety disorder: Secondary | ICD-10-CM | POA: Diagnosis not present

## 2016-07-05 DIAGNOSIS — Z79899 Other long term (current) drug therapy: Secondary | ICD-10-CM | POA: Diagnosis not present

## 2016-07-05 DIAGNOSIS — F331 Major depressive disorder, recurrent, moderate: Secondary | ICD-10-CM

## 2016-07-05 MED ORDER — LAMOTRIGINE 25 MG PO TABS: ORAL_TABLET | ORAL | 0 refills | Status: DC

## 2016-07-05 NOTE — Progress Notes (Signed)
[facility] MD/PA/NP OP Progress Note  07/05/2016 11:38 AM CONSUELLA SCURLOCK  MRN:  [id number]  Chief Complaint:  Chief Complaint    Establish Care; Anxiety; Depression     Subjective:  I'm very depressed.  I am very emotional and anxious.  HPI: Sierra Schneider is 31 year old Caucasian, employed divorced female who is referred from her primary care physician for the management of depression and anxiety symptoms.  Patient struggles with her depression and anxiety for more than 8 years.  She remember being depressed before her daughter born 7 years ago and off for the delivery her depression got worse.  In past 7 years she had tried multiple antidepressant which seems to be work for little while.  Patient endorsed multiple stressors including finances, busy schedule in her life, a lot of responsibility and taking care of her 2 children who are 12 and 24 years old.  Patient told she was taking Zoloft which was working very good until the year and half ago stopped working.  Since then her primary care physician tried multiple medication to help the depression but they're not effective.  She tried Cymbalta that causes sexual side effects and did not work.  Now she is taking Wellbutrin XL 300 mg every morning.  She does not feel that medicine working.  She continues to have irritability, crying spells, hopelessness, anhedonia, poor sleep, anxiety and nervousness.  She admitted getting easily irritable, emotional and having mood swings.  She endorse her fianc is very supportive of there are times that they has been argumentative.  She began having anger issues but denies any mania, psychosis or any hallucination.  She is also concerned about her health issues.  She was diagnosis with diabetes and she admitted having difficulty controlling her blood sugar.  She admitted having impulsive eating.  Her last malignancy was 7.3.  Patient denies any suicidal thoughts or homicidal thought.  She recently stopped smoking and gained a lot  of weight.  She lives with HER-2 children and fianc.  Her parents live close by and they are very supportive.  Patient also concerned about her younger brother who has opiate addiction.  Patient told currently he is going to methadone program but over the past few years their relationship has been downhill.  Patient like to try a different medication.  She is not seeing any therapist.  She is working as a Passenger transport manager.  Patient denies drinking alcohol or using any illegal substances.  She endorse her energy level is low.  Her appetite is fair.  Her vital signs are stable.  Visit Diagnosis:    ICD-9-CM ICD-10-CM   1. Moderate episode of recurrent major depressive disorder (HCC) 296.32 F33.1 lamoTRIgine (LAMICTAL) 25 MG tablet    Past Psychiatric History: Patient remember being depressed 8 years ago.  She took Prozac from her primary care physician however when her daughter born she quit taking.  In the past 8 years she had tried Effexor, BuSpar, Zoloft, Cymbalta.  Patient denies any history of suicidal attempt or any psychiatric inpatient treatment.  Past Medical History:  Past Medical History:  Diagnosis Date  . Depression   . Diabetes mellitus 08/2009   late onset type 1    Past Surgical History:  Procedure Laterality Date  . CESAREAN SECTION    . ESOPHAGOGASTRODUODENOSCOPY Left 10/14/2012   Procedure: ESOPHAGOGASTRODUODENOSCOPY (EGD);  Surgeon: Winfield Cunas., MD;  Location: [facility] END[facility]COPY;  Service: Endoscopy;  Laterality: Left;    Family Psychiatric History: Reviewed.  Family  History:  Family History  Problem Relation Age of Onset  . Depression Mother   . Depression Father   . Kidney disease Paternal Grandmother   . Diabetes Maternal Grandfather   . ADD / ADHD Brother   . Heart disease Neg Hx     Social History:  Social History   Social History  . Marital status: Single    Spouse name: N/A  . Number of children: N/A  . Years of education: N/A   Social  History Main Topics  . Smoking status: Former Smoker    Packs/day: 0.00    Quit date: 01/26/2014  . Smokeless tobacco: Never Used  . Alcohol use Yes     Comment: 1 drink every 3 months  . Drug use: No  . Sexual activity: Yes    Birth control/ protection: IUD     Comment: mirena   Other Topics Concern  . None   Social History Narrative   Patient goes to school full-time at [employer]. Patient works part-time at [employer]. Is engaged. Having relationship difficulties. Has 2 children-ages 2 and 8.    Allergies: No Known Allergies  Metabolic Disorder Labs: Lab Results  Component Value Date   HGBA1C 7.3 (H) 04/23/2016   MPG 189 (H) 03/13/2014   MPG 189 (H) 04/10/2013   No results found for: PROLACTIN Lab Results  Component Value Date   CHOL 128 04/23/2016   TRIG 153.0 (H) 04/23/2016   HDL 46.10 04/23/2016   CHOLHDL 3 04/23/2016   VLDL 30.6 04/23/2016   LDLCALC 51 04/23/2016   LDLCALC 81 08/25/2015     Current Medications: Current Outpatient Prescriptions  Medication Sig Dispense Refill  . buPROPion (WELLBUTRIN XL) 300 MG 24 hr tablet Take 1 tablet (300 mg total) by mouth daily. 30 tablet 3  . Melatonin 1 MG CAPS Take 1 capsule (1 mg total) by mouth at bedtime. 30 capsule 5  . NOVOLOG 100 UNIT/ML injection INJECT 80 UNITS PER INSULIN PUMP MAX DAILY. 30 mL 2  . rosuvastatin (CRESTOR) 10 MG tablet TAKE 1 TABLET (10 MG TOTAL) BY MOUTH DAILY. 30 tablet 3  . lamoTRIgine (LAMICTAL) 25 MG tablet Take 1 tab daily for 1 week and than 2 tab daily 60 tablet 0   No current facility-administered medications for this visit.     Neurologic: Headache: Yes Seizure: No Paresthesias: No  Musculoskeletal: Strength & Muscle Tone: within normal limits Gait & Station: normal Patient leans: N/A  Psychiatric Specialty Exam: Review of Systems  Constitutional: Negative.  Negative for weight loss.  HENT: Negative.   Musculoskeletal:       Chronic pain  Skin:  Negative.   Neurological: Positive for headaches.  Psychiatric/Behavioral: Positive for depression. The patient is nervous/anxious and has insomnia.     Blood pressure 120/68, pulse 90, height _0  (1.626 m), weight 150 lb (68 kg).Body mass index is 25.75 kg/m.  General Appearance: Casual  Eye Contact:  Good  Speech:  Clear and Coherent and Fast  Volume:  Normal  Mood:  Depressed and Emotional and tearful  Affect:  Constricted and Depressed  Thought Process:  Goal Directed  Orientation:  Full (Time, Place, and Person)  Thought Content: Logical and Rumination   Suicidal Thoughts:  No  Homicidal Thoughts:  No  Memory:  Immediate;   Good Recent;   Good Remote;   Good  Judgement:  Good  Insight:  Good  Psychomotor Activity:  Normal  Concentration:  Concentration: Good and Attention Span:  Good  Recall:  Good  Fund of Knowledge: Good  Language: Good  Akathisia:  No  Handed:  Right  AIMS (if indicated):  0  Assets:  Communication Skills Desire for Improvement Housing Social Support Transportation  ADL's:  Intact  Cognition: WNL  Sleep:  Fair     Assessment: Major depressive disorder, recurrent moderate.  Generalized anxiety disorder  Plan: I review her psychosocial stressors, history, current medication, recent blood work results.  Patient is taking Wellbutrin XL 300 mg daily and does not see it is helping as much.  She is very labile, tearful, anxious and depressed.  Patient does not want any medication that causes sexual side effects and weight gain.  Patient has diabetes.  She had tried multiple SSRIs.  I recommended to try Lamictal 25 mg daily for 1 week and then 50 mg daily.  Discussed medication side effects specially if she develop rash that she needed to stop the medication immediately.  She will continue Wellbutrin XL 300 mg for now however we will reduce the dose months limited to start working.  I also believe that she should see a therapist for CBT.  Patient agreed  and we will schedule appointment with a therapist in this office.  Discuss safety plan that anytime having active suicidal thoughts or homicidal thoughts and she need to call 911 or go to the local emergency room.  Follow-up in 3 weeks.  ARFEEN,SYED T., MD 07/05/2016, 11:38 AM

## 2016-07-11 ENCOUNTER — Encounter: Payer: Self-pay | Admitting: Gynecology

## 2016-07-12 ENCOUNTER — Ambulatory Visit (INDEPENDENT_AMBULATORY_CARE_PROVIDER_SITE_OTHER): Payer: BLUE CROSS/BLUE SHIELD | Admitting: Psychology

## 2016-07-12 ENCOUNTER — Encounter (HOSPITAL_COMMUNITY): Payer: Self-pay | Admitting: Psychology

## 2016-07-12 DIAGNOSIS — F331 Major depressive disorder, recurrent, moderate: Secondary | ICD-10-CM

## 2016-07-12 NOTE — Progress Notes (Signed)
Comprehensive Clinical Assessment (CCA) Note  07/12/2016 Sierra Schneider [id number]  Visit Diagnosis:      ICD-9-CM ICD-10-CM   1. Moderate episode of recurrent major depressive disorder (HCC) 296.32 F33.1       CCA Part One  Part One has been completed on paper by the patient.  (See scanned document in Chart Review)  CCA Part Two A  Intake/Chief Complaint:  CCA Intake With Chief Complaint CCA Part Two Date: 07/12/16 CCA Part Two Time: 0805 Chief Complaint/Presenting Problem: pt presents for counseling for depression.  Pt is currently being tx by Dr. Lolly Schneider for MDD.  pt reports she has dealt w/ depression since a child and began tx in adolescence. pt reported for years Zoloft worked for her depression and then just stopped working. pt has been through many medications since that don't seem to be working or have negative side effects.  Pt discussed hx of relationships that have impacted. pt reported she was w/ her 14y/o son's father in high school, had her son at 16y/o and his father was alcoholic- after a year of marriage separated.  pt reports dated her 7y/o daughters father when son turned about 2y/o and together for 7 years.  She reports that he wasn't stable- wasn't able to hold a job, she worked full time, full time through school when daughter was a Development worker, international aid and they seprated when daughter about 1 y/o.  pt reports she been together w/ fiance for 3 years and very positive relationship, supportive of her, great w/ her kids.  pt reports she also was diagnosed w/ type 1 diabetes when daughter was about 1.5 and this was life change for her as well.  pt reports that currently diabetes better managed w/ pump and sensor technology.  pt reports another life change w/ brothers heroin/opiate addiction.  Pt other stressors current include managing finances, work stressors of training new employee and day to day annoyances w/ being impatient.   Patients Currently Reported Symptoms/Problems: pt is tearful  when discussing something upsetting.  pt reports in public shuts off emotions.  pt reports she deals w/ sleep disturbance- difficulty falling asleep, mind ruminating on things at night, feeling extremely fatigued.  Pt reports she is always irritable and easily annoyed- littlest thing can have her going 0-10- will curse and put down other drivers, impatient in lines.  pt reports feels more anxious over past couple of years- becoming anxious and worried sbout irriational things.  no SI, no self harm.  No manic symptoms.  no pyschosis.  no trauma/ PTSD symptoms.  Collateral Involvement: Dr. Sheela Schneider note Individual's Strengths: support of parents, support of maternal aunt, support of fiance.  very independent.  enjoys working in yard and being outside.  Individual's Preferences: to help w/ irritability, mood swings, expression of anger.  Type of Services Patient Feels Are Needed: counseling and medication managment.   Mental Health Symptoms Depression:  Depression: Change in energy/activity, Difficulty Concentrating, Fatigue, Increase/decrease in appetite, Irritability, Sleep (too much or little), Tearfulness, Worthlessness  Mania:  Mania: N/A  Anxiety:   Anxiety: Difficulty concentrating, Fatigue, Irritability, Worrying, Sleep  Psychosis:  Psychosis: N/A  Trauma:  Trauma: N/A  Obsessions:  Obsessions: N/A  Compulsions:  Compulsions: N/A  Inattention:  Inattention: N/A  Hyperactivity/Impulsivity:  Hyperactivity/Impulsivity: N/A  Oppositional/Defiant Behaviors:  Oppositional/Defiant Behaviors: N/A  Borderline Personality:  Emotional Irregularity: N/A  Other Mood/Personality Symptoms:      Mental Status Exam Appearance and self-care  Stature:  Stature: Average  Weight:  Weight: Average weight  Clothing:  Clothing: Neat/clean  Grooming:  Grooming: Well-groomed  Cosmetic use:  Cosmetic Use: Age appropriate  Posture/gait:  Posture/Gait: Normal  Motor activity:  Motor Activity: Not Remarkable   Sensorium  Attention:  Attention: Normal  Concentration:  Concentration: Normal  Orientation:  Orientation: X5  Recall/memory:  Recall/Memory: Normal  Affect and Mood  Affect:  Affect: Depressed, Tearful  Mood:  Mood: Depressed, Anxious, Irritable  Relating  Eye contact:  Eye Contact: Normal  Facial expression:  Facial Expression: Depressed  Attitude toward examiner:  Attitude Toward Examiner: Cooperative  Thought and Language  Speech flow: Speech Flow: Normal  Thought content:  Thought Content: Appropriate to mood and circumstances  Preoccupation:     Hallucinations:     Organization:     Company secretaryxecutive Functions  Fund of Knowledge:  Fund of Knowledge: Average  Intelligence:  Intelligence: Average  Abstraction:  Abstraction: Normal  Judgement:  Judgement: Normal  Reality Testing:  Reality Testing: Adequate  Insight:  Insight: Good  Decision Making:  Decision Making: Normal, Impulsive (w/ anger impulsive)  Social Functioning  Social Maturity:  Social Maturity: Responsible  Social Judgement:  Social Judgement: Normal  Stress  Stressors:  Stressors: Arts administratorMoney, Work  Coping Ability:  Coping Ability: Building surveyorverwhelmed  Skill Deficits:     Supports:      Family and Psychosocial History: Family history Marital status: Divorced Divorced, when?: over 10 years Long term relationship, how long?: 3 years w/ fiance- living together- not marrying currently for financial reasons.   What types of issues is patient dealing with in the relationship?: none Are you sexually active?: Yes What is your sexual orientation?: heterosexual Has your sexual activity been affected by drugs, alcohol, medication, or emotional stress?: yes- loss of libido w/ depression and certain medications Does patient have children?: Yes How many children?: 2 How is patient's relationship with their children?: 14y/o son, Sierra LongsJoseph and 7y/o daughter Sierra Schneider.  pt daughter visits her father about every other weekend and once a week.  son  sees father infrequently- mostly when visiting grandfather.  pt reports positive relationship w/ both children.   Childhood History:  Childhood History By whom was/is the patient raised?: Both parents Additional childhood history information: very close family Description of patient's relationship with caregiver when they were a child: postive Patient's description of current relationship with people who raised him/her: sees daily as brings her newphew who lives w/ parents to school and picks her daughter up from parents in evening. also sunday dinner at parents weekly.  Does patient have siblings?: Yes Number of Siblings: 1 Description of patient's current relationship with siblings: 229 y/o brother.  Pt reports that text or talk to brother daily.  has had to distance self some from brother w/ his addiction.  brother does go to Methadone clinic and doing "ok" but pt worries about him a lot.  Did patient suffer any verbal/emotional/physical/sexual abuse as a child?: No Did patient suffer from severe childhood neglect?: No Has patient ever been sexually abused/assaulted/raped as an adolescent or adult?: No Was the patient ever a victim of a crime or a disaster?: No Witnessed domestic violence?: No Has patient been effected by domestic violence as an adult?: No (pt reports first husband and her were like gasonline and fire-each would push buttons and get verbally abusive. )  CCA Part Two B  Employment/Work Situation: Employment / Work Situation Employment situation: Employed Where is patient currently employed?: [employer] How long has patient been employed?: 4 years  Patient's job has been impacted by current illness: No What is the longest time patient has a held a job?: current job Has patient ever been in the [employer]?: No Are There Guns or Education officer, community in Your Home?: Yes Types of Guns/Weapons: fiance's hunting guns Are These Comptroller?: Yes  Education: Education Last  Grade Completed: 14 Did Garment/textile technologist From [employer]?: Yes (completed her GED when pregnant w/ daughter) Did You Attend College?: Yes What Type of College Degree Do you Have?: associates degree in business Administration and Human resources Did You Have An Individualized Education Program (IIEP): No Did You Have Any Difficulty At School?: No  Religion: Religion/Spirituality Are You A Religious Person?: No  Leisure/Recreation: Leisure / Recreation Leisure and Hobbies: yard work, being outside, spending time w/ fiance  Exercise/Diet: Exercise/Diet Do You Exercise?: Yes What Type of Exercise Do You Do?: Run/Walk (walk w/ neighbor almost daily- not very strenous) How Many Times a Week Do You Exercise?: 1-3 times a week Have You Gained or Lost A Significant Amount of Weight in the Past Six Months?: Yes-Gained (in past year) Number of Pounds Gained: 30 Do You Follow a Special Diet?: Yes Type of Diet: diabetic- tries to follow Do You Have Any Trouble Sleeping?: Yes Explanation of Sleeping Difficulties: difficulty falling asleep and staying asleep  CCA Part Two C  Alcohol/Drug Use: Alcohol / Drug Use History of alcohol / drug use?: No history of alcohol / drug abuse                      CCA Part Three  ASAM's:  Six Dimensions of Multidimensional Assessment  Dimension 1:  Acute Intoxication and/or Withdrawal Potential:     Dimension 2:  Biomedical Conditions and Complications:     Dimension 3:  Emotional, Behavioral, or Cognitive Conditions and Complications:     Dimension 4:  Readiness to Change:     Dimension 5:  Relapse, Continued use, or Continued Problem Potential:     Dimension 6:  Recovery/Living Environment:      Substance use Disorder (SUD)    Social Function:  Social Functioning Social Maturity: Responsible Social Judgement: Normal  Stress:  Stress Stressors: Arts administrator, Work Coping Ability: Overwhelmed Patient Takes Medications The Way The Doctor  Instructed?: Yes Priority Risk: Low Acuity  Risk Assessment- Self-Harm Potential: Risk Assessment For Self-Harm Potential Thoughts of Self-Harm: No current thoughts Method: No plan  Risk Assessment -Dangerous to Others Potential: Risk Assessment For Dangerous to Others Potential Method: No Plan  DSM5 Diagnoses: Patient Active Problem List   Diagnosis Date Noted  . IUD (intrauterine device) in place 04/10/2016  . Insomnia 03/20/2016  . General counseling and advice on female contraception 03/20/2016  . Idiopathic edema 05/10/2015  . Hidradenitis axillaris 03/16/2014  . Generalized anxiety disorder 06/29/2013  . Pure hypercholesterolemia 01/03/2013  . Depression 12/01/2010  . Uncontrolled type 1 diabetes mellitus (HCC) 09/14/2009    Patient Centered Plan: Patient is on the following Treatment Plan(s):  Depression see tx plan on file  Recommendations for Services/Supports/Treatments: Recommendations for Services/Supports/Treatments Recommendations For Services/Supports/Treatments: Individual Therapy, Medication Management  Treatment Plan Summary:   Pt to continue f/u w/ Dr. Lolly Schneider as scheduled.  Pt to begin counseling to address depression and irritability.  Pt would like to try f/u monthly for financial reasons.   Forde Radon

## 2016-07-23 ENCOUNTER — Ambulatory Visit (HOSPITAL_COMMUNITY): Payer: Self-pay | Admitting: Psychiatry

## 2016-07-25 ENCOUNTER — Other Ambulatory Visit: Payer: Self-pay | Admitting: *Deleted

## 2016-07-25 MED ORDER — BUPROPION HCL ER (XL) 300 MG PO TB24: 300.0000 mg | ORAL_TABLET | Freq: Every day | ORAL | 3 refills | Status: DC

## 2016-07-31 ENCOUNTER — Ambulatory Visit (HOSPITAL_COMMUNITY): Payer: BLUE CROSS/BLUE SHIELD | Admitting: Psychiatry

## 2016-08-01 ENCOUNTER — Other Ambulatory Visit: Payer: Self-pay | Admitting: *Deleted

## 2016-08-02 ENCOUNTER — Other Ambulatory Visit (HOSPITAL_COMMUNITY): Payer: Self-pay | Admitting: Psychiatry

## 2016-08-02 DIAGNOSIS — F331 Major depressive disorder, recurrent, moderate: Secondary | ICD-10-CM

## 2016-08-03 ENCOUNTER — Other Ambulatory Visit (INDEPENDENT_AMBULATORY_CARE_PROVIDER_SITE_OTHER): Payer: BLUE CROSS/BLUE SHIELD

## 2016-08-03 DIAGNOSIS — E1065 Type 1 diabetes mellitus with hyperglycemia: Secondary | ICD-10-CM | POA: Diagnosis not present

## 2016-08-03 LAB — GLUCOSE, RANDOM: Glucose, Bld: 165 mg/dL — ABNORMAL HIGH (ref 70–99)

## 2016-08-03 LAB — HEMOGLOBIN A1C: Hgb A1c MFr Bld: 7.7 % — ABNORMAL HIGH (ref 4.6–6.5)

## 2016-08-06 ENCOUNTER — Encounter: Payer: Self-pay | Admitting: Endocrinology

## 2016-08-06 ENCOUNTER — Other Ambulatory Visit (HOSPITAL_COMMUNITY): Payer: Self-pay | Admitting: Psychiatry

## 2016-08-06 ENCOUNTER — Ambulatory Visit (INDEPENDENT_AMBULATORY_CARE_PROVIDER_SITE_OTHER): Payer: BLUE CROSS/BLUE SHIELD | Admitting: Endocrinology

## 2016-08-06 ENCOUNTER — Ambulatory Visit: Payer: BLUE CROSS/BLUE SHIELD | Admitting: Endocrinology

## 2016-08-06 VITALS — BP 114/78 | HR 89 | Ht 64.0 in | Wt 151.6 lb

## 2016-08-06 DIAGNOSIS — E1065 Type 1 diabetes mellitus with hyperglycemia: Secondary | ICD-10-CM

## 2016-08-06 NOTE — Progress Notes (Signed)
Patient ID: Sierra Schneider, female   DOB: 1985/10/27, 31 y.o.   MRN: [id number]   Reason for Appointment: Diabetes followup:   History of Present Illness   Diagnosis: Type 1 DIABETES MELITUS     DIABETES history: She has had diabetes since about 2011 and has been on insulin pump for several years She had fairly good control in the first 2 years of her disease but blood sugar has been more difficult subsequently  CURRENT insulin pump:  Medtronic 670 G  SETTINGS are:    Basal rate on current download = Midnight = 1.2, 7 AM = 1.4, 10 AM = 0.6, 12 noon = 1.3, 6 PM = 1.05 and 7 PM = 0.9  Total basal insulin = 28 units Carb Ratio: 1:10. Correction factor 1: 30, target 100  Her A1c was last 7.3% and is now 7.7    Current management, blood sugar patterns and problems identified:  She has been the AUTO mode for 65% of the time in late May and less recently because of using her sensor only 24% of the time  Her blood sugars are overall averaging 164+/- 68, previous 2 weeks averaging 172+/-65  With her sensor previously her sugars were within target 56% of the time  Reasons for exiting auto mode almost frequently high blood sugar, or auto mode maximum delivery  She is having difficulties with affording her sensor and occasionally it does not last long  Also she is getting out of the auto more periodically because of unknown alarms recently  BLOOD SUGAR PATTERNS:  OVERNIGHT blood sugars appear to be early consistently high on an average although previously on the sensor showing considerable fluctuation but averaging around 180 frequently  Blood sugars generally tend to be on an average around 150-180 most of the day  Postprandial readings are relatively higher after lunch and this is more evident recently on her continuous sensor.  This is despite her trying to take her lunch from home and not eat high fat fast food; she does however occasionally eat fast food but will  add a salad instead of french fries, does adjust her boluses based on her right intake  Postprandial readings AFTER supper have been variable with higher readings occasionally in the last month but more recently starting get relatively lower  She may be getting lower after his evening meal now because of starting to walk after supper and she does not make any adjustments to her blood sugar target or boluses when planning to exercise  Hypoglycemia has been minimal with one low reading after evening meal and once at bedtime    Wt Readings from Last 3 Encounters:  08/06/16 151 lb 9.6 oz (68.8 kg)  06/26/16 154 lb (69.9 kg)  06/06/16 150 lb (68 kg)     LABS:  Lab Results  Component Value Date   HGBA1C 7.7 (H) 08/03/2016   HGBA1C 7.3 (H) 04/23/2016   HGBA1C 7.1 (H) 01/24/2016   Lab Results  Component Value Date   MICROALBUR 6.3 (H) 04/23/2016   LDLCALC 51 04/23/2016   CREATININE 0.63 04/23/2016    OTHER active problems discussed today: See review of systems    Allergies as of 08/06/2016   No Known Allergies     Medication List       Accurate as of 08/06/16  8:45 PM. Always use your most recent med list.          buPROPion 300 MG 24 hr tablet Commonly known  as:  WELLBUTRIN XL Take 1 tablet (300 mg total) by mouth daily.   lamoTRIgine 25 MG tablet Commonly known as:  LAMICTAL Take 1 tab daily for 1 week and than 2 tab daily   Melatonin 1 MG Caps Take 1 capsule (1 mg total) by mouth at bedtime.   NOVOLOG 100 UNIT/ML injection Generic drug:  insulin aspart INJECT 80 UNITS PER INSULIN PUMP MAX DAILY.   rosuvastatin 10 MG tablet Commonly known as:  CRESTOR TAKE 1 TABLET (10 MG TOTAL) BY MOUTH DAILY.       Allergies: No Known Allergies  Past Medical History:  Diagnosis Date  . Depression   . Diabetes mellitus 08/2009   late onset type 1  . Diabetes mellitus type I [facility])     Past Surgical History:  Procedure Laterality Date  . CESAREAN SECTION    .  ESOPHAGOGASTRODUODENOSCOPY Left 10/14/2012   Procedure: ESOPHAGOGASTRODUODENOSCOPY (EGD);  Surgeon: Vertell Novak., MD;  Location: [facility]COPY;  Service: Endoscopy;  Laterality: Left;    Family History  Problem Relation Age of Onset  . Depression Mother   . Kidney disease Paternal Grandmother   . Diabetes Maternal Grandfather   . ADD / ADHD Brother   . Depression Brother   . Drug abuse Brother   . Depression Maternal Aunt   . Depression Paternal Aunt   . Psychosis Cousin   . Heart disease Neg Hx     Social History:  reports that she quit smoking about 2 years ago. She smoked 0.00 packs per day. She has never used smokeless tobacco. She reports that she drinks alcohol. She reports that she does not use drugs.  REVIEW of systems:   She has had mild HYPERCHOLESTEROLEMIA,  Because of her family history of hypercholesterolemia and her diabetes she was previously on pravastatin, this was switched to Crestor With baseline of LDL of 146  LDL improved as follows:  Lab Results  Component Value Date   CHOL 128 04/23/2016   CHOL 149 08/25/2015   CHOL 214 (H) 07/11/2015   Lab Results  Component Value Date   HDL 46.10 04/23/2016   HDL 53.20 08/25/2015   HDL 41.60 07/11/2015   Lab Results  Component Value Date   LDLCALC 51 04/23/2016   LDLCALC 81 08/25/2015   LDLCALC 146 (H) 07/11/2015   Lab Results  Component Value Date   TRIG 153.0 (H) 04/23/2016   TRIG 71.0 08/25/2015   TRIG 132.0 07/11/2015   Lab Results  Component Value Date   CHOLHDL 3 04/23/2016   CHOLHDL 3 08/25/2015   CHOLHDL 5 07/11/2015   Lab Results  Component Value Date   LDLDIRECT 129.4 12/29/2012    She is asking about a lump she feels on the outside of her left lower jaw, recently little tender, has been evaluated by PCP and dentist without diagnoses      EXAM:  BP 114/78   Pulse 89   Ht 5\' 4"  (1.626 m)   Wt 151 lb 9.6 oz (68.8 kg)   SpO2 97%   BMI 26.02 kg/m    ASSESSMENT:    See  history of present illness for detailed discussion of  current management, blood sugar patterns and problems identified  She has been on the auto mode with variable results  Her blood sugar control is overall still difficult to control Although her A1c is 7.7 it is getting progressively higher  Although with her auto mode she tends to have relatively good readings  overnight she has not been consistently in the auto mode even when she was wearing a sensor most of the time Part of her hyperglycemia is related to postprandial increase after lunch and sometimes after supper when she appears not be getting enough boluses even sometimes with cutting back on higher fat meals Also tends to have rebound with low normal sugars especially recently when she is walking after dinner but not adjusting her pump settings for this She is having some difficulties with the sensor and also affordability currently  Day-to-day management of her diabetes, diet and boluses were discussed   Recommendations:    She will try to use a higher carbohydrate ratio at lunchtime of 1:8 to start with which will give her 1. 5-2 units more at lunch meals usually  Since she is trying to walk after dinner will continue 1:10 carbohydrate ratio at dinnertime but have her change the target to 150 15-30 min before walking  She will also make sure she is trying to bolus at least 10 minute before a meal especially at lunchtime  Reminded him try giving her fat intake down  She will discuss any issues with her sensor with [employer]  If she is having issues with attaching her sensor to her abdomen she may try upper arm also  She will call the diabetes nurse or the office if having significant hyperglycemia again  Meanwhile she will increase her basal rate at midnight by 0.1 and also at 9 PM by 0.1  Swelling on her left jaw area: She will need to see an ENT specialist  Patient Instructions  Bolus 10 min before meal Target 150  for walking    Counseling time on subjects discussed above is over 50% of today's 25 minute visit   Darnice Comrie 08/06/2016, 8:45 PM

## 2016-08-06 NOTE — Patient Instructions (Signed)
Bolus 10 min before meal Target 150 for walking

## 2016-08-07 ENCOUNTER — Encounter (HOSPITAL_COMMUNITY): Payer: Self-pay | Admitting: Psychiatry

## 2016-08-07 ENCOUNTER — Ambulatory Visit (INDEPENDENT_AMBULATORY_CARE_PROVIDER_SITE_OTHER): Payer: BLUE CROSS/BLUE SHIELD | Admitting: Psychiatry

## 2016-08-07 DIAGNOSIS — Z87891 Personal history of nicotine dependence: Secondary | ICD-10-CM

## 2016-08-07 DIAGNOSIS — Z9641 Presence of insulin pump (external) (internal): Secondary | ICD-10-CM | POA: Diagnosis not present

## 2016-08-07 DIAGNOSIS — F411 Generalized anxiety disorder: Secondary | ICD-10-CM

## 2016-08-07 DIAGNOSIS — Z79899 Other long term (current) drug therapy: Secondary | ICD-10-CM

## 2016-08-07 DIAGNOSIS — F331 Major depressive disorder, recurrent, moderate: Secondary | ICD-10-CM

## 2016-08-07 DIAGNOSIS — Z814 Family history of other substance abuse and dependence: Secondary | ICD-10-CM | POA: Diagnosis not present

## 2016-08-07 DIAGNOSIS — Z818 Family history of other mental and behavioral disorders: Secondary | ICD-10-CM

## 2016-08-07 DIAGNOSIS — E119 Type 2 diabetes mellitus without complications: Secondary | ICD-10-CM

## 2016-08-07 DIAGNOSIS — F339 Major depressive disorder, recurrent, unspecified: Secondary | ICD-10-CM

## 2016-08-07 MED ORDER — LAMOTRIGINE 100 MG PO TABS: 100.0000 mg | ORAL_TABLET | Freq: Every day | ORAL | 2 refills | Status: DC

## 2016-08-07 NOTE — Progress Notes (Signed)
BH MD/PA/NP OP Progress Note  08/07/2016 8:46 AM Sierra Schneider  MRN:  [id number]  Chief Complaint:  Subjective:  I am doing better with the new medication.  HPI: Sierra Schneider came for her follow-up appointment.  She was seen first time 4 weeks ago as she is referred from primary care physician for the management of depression and anxiety symptoms.  Patient has multiple stressors.  She has diabetes and insulin pump, brother has opiate dependency , financial stressors and busy schedule in her life.  She also responsible drinking care of 2 children who are 4114 and 32 years old.  Despite taking antidepressant and she continues to have irritability, crying spells, racing thoughts, depression and feeling hopelessness and worthlessness.  We started her on Lamictal and she is doing much better.  She is disappointed because of her hemoglobin A1c which jumped from the past but she believed due to noncompliance with medication and poor control of type.  She is hoping to have a better control in the future.  She has noticed her sleep is improved and she has more energy.  She denies any agitation or any anger but she still have some time crying spells but they're less intense and less frequent.  Her fianc is very supportive.  She believe her energy level is improved and she is more active.  Patient denies drinking alcohol or using any illegal substances.  She lives with her 2 children and fianc.  Her parents live close by.  Patient is working as a Estate agentforklift operator.  Patient recently seen endocrinologist and she had blood work.   Visit Diagnosis:    ICD-10-CM   1. Moderate episode of recurrent major depressive disorder (HCC) F33.1 lamoTRIgine (LAMICTAL) 100 MG tablet    Past Psychiatric History: Reviewed. Patient reported history of depression when her daughter born.  She took the medication from her primary care physician with little help.  She remembered taking Prozac, BuSpar, Effexor, Zoloft and Cymbalta.   Patient denies any history of suicidal attempt or any psychiatric inpatient treatment.  She denies any history of mania psychosis or any hallucination.  Past Medical History:  Past Medical History:  Diagnosis Date  . Depression   . Diabetes mellitus 08/2009   late onset type 1  . Diabetes mellitus type I [facility](HCC)     Past Surgical History:  Procedure Laterality Date  . CESAREAN SECTION    . ESOPHAGOGASTRODUODENOSCOPY Left 10/14/2012   Procedure: ESOPHAGOGASTRODUODENOSCOPY (EGD);  Surgeon: Vertell NovakJames L Edwards Jr., MD;  [facility];  Service: Endoscopy;  Laterality: Left;    Family Psychiatric History: Reviewed.  Family History:  Family History  Problem Relation Age of Onset  . Depression Mother   . Kidney disease Paternal Grandmother   . Diabetes Maternal Grandfather   . ADD / ADHD Brother   . Depression Brother   . Drug abuse Brother   . Depression Maternal Aunt   . Depression Paternal Aunt   . Psychosis Cousin   . Heart disease Neg Hx     Social History:  Social History   Social History  . Marital status: Single    Spouse name: N/A  . Number of children: N/A  . Years of education: N/A   Social History Main Topics  . Smoking status: Former Smoker    Packs/day: 0.00    Quit date: 01/26/2014  . Smokeless tobacco: Never Used  . Alcohol use Yes     Comment: 1 drink every 3 months  . Drug  use: No  . Sexual activity: Yes    Birth control/ protection: IUD     Comment: mirena   Other Topics Concern  . Not on file   Social History Narrative   Patient goes to school full-time at [employer]. Patient works part-time at [employer]. Is engaged. Having relationship difficulties. Has 2 children-ages 2 and 8.    Allergies: No Known Allergies  Metabolic Disorder Labs: Recent Results (from the past 2160 hour(s))  Hemoglobin A1c     Status: Abnormal   Collection Time: 08/03/16  3:24 PM  Result Value Ref Range   Hgb A1c MFr Bld 7.7 (H) 4.6 - 6.5 %     Comment: Glycemic Control Guidelines for People with Diabetes:Non Diabetic:  <6%Goal of Therapy: <7%Additional Action Suggested:  >8%   Glucose, random     Status: Abnormal   Collection Time: 08/03/16  3:24 PM  Result Value Ref Range   Glucose, Bld 165 (H) 70 - 99 mg/dL   Lab Results  Component Value Date   HGBA1C 7.7 (H) 08/03/2016   MPG 189 (H) 03/13/2014   MPG 189 (H) 04/10/2013   No results found for: PROLACTIN Lab Results  Component Value Date   CHOL 128 04/23/2016   TRIG 153.0 (H) 04/23/2016   HDL 46.10 04/23/2016   CHOLHDL 3 04/23/2016   VLDL 30.6 04/23/2016   LDLCALC 51 04/23/2016   LDLCALC 81 08/25/2015     Current Medications: Current Outpatient Prescriptions  Medication Sig Dispense Refill  . buPROPion (WELLBUTRIN XL) 300 MG 24 hr tablet Take 1 tablet (300 mg total) by mouth daily. 30 tablet 3  . lamoTRIgine (LAMICTAL) 100 MG tablet Take 1 tablet (100 mg total) by mouth daily. 30 tablet 2  . Melatonin 1 MG CAPS Take 1 capsule (1 mg total) by mouth at bedtime. 30 capsule 5  . NOVOLOG 100 UNIT/ML injection INJECT 80 UNITS PER INSULIN PUMP MAX DAILY. 30 mL 2  . rosuvastatin (CRESTOR) 10 MG tablet TAKE 1 TABLET (10 MG TOTAL) BY MOUTH DAILY. 30 tablet 3   No current facility-administered medications for this visit.     Neurologic: Headache: No Seizure: No Paresthesias: No  Musculoskeletal: Strength & Muscle Tone: within normal limits Gait & Station: normal Patient leans: N/A  Psychiatric Specialty Exam: Review of Systems  Constitutional: Negative.   HENT: Negative.   Eyes: Negative.   Respiratory: Negative.   Cardiovascular: Negative.   Gastrointestinal: Negative.   Musculoskeletal: Negative.   Skin: Negative.  Negative for itching and rash.  Neurological: Negative.   Psychiatric/Behavioral: The patient is nervous/anxious.     There were no vitals taken for this visit.There is no height or weight on file to calculate BMI.  General Appearance: Casual   Eye Contact:  Good  Speech:  Clear and Coherent  Volume:  Normal  Mood:  Anxious  Affect:  Constricted  Thought Process:  Goal Directed  Orientation:  Full (Time, Place, and Person)  Thought Content: WDL and Logical   Suicidal Thoughts:  No  Homicidal Thoughts:  No  Memory:  Immediate;   Good Recent;   Good Remote;   Good  Judgement:  Good  Insight:  Good  Psychomotor Activity:  Normal  Concentration:  Concentration: Good and Attention Span: Good  Recall:  Good  Fund of Knowledge: Good  Language: Good  Akathisia:  No  Handed:  Right  AIMS (if indicated):  0  Assets:  Communication Skills Desire for Improvement Housing Resilience Social Support  Talents/Skills Transportation  ADL's:  Intact  Cognition: WNL  Sleep:  Improved    Assessment: Major depressive disorder, recurrent.  Generalized anxiety disorder.  Plan: Patient is doing better since Lamictal and it.  I reviewed also blood work results and collateral information from other provider.  She is disappointed with hemoglobin A1c but she is trying to improve the numbers and promised to keep her impulsive eating under control.  I recommended to try increasing Lamictal to help the resident mood lability and depression.  Patient does not have any rash, itching, tremors or shakes.  I also encouraged to continue Adella Hare for coping and social skills.  We will consider lowering the Wellbutrin dose in the future.  She is getting Wellbutrin from the primary care physician.  Discussed medication side effects and benefits.  Lamictal 100 mg daily.  Follow-up in 3 months.  ARFEEN,SYED T., MD 08/07/2016, 8:46 AM

## 2016-08-21 ENCOUNTER — Ambulatory Visit (HOSPITAL_COMMUNITY): Payer: BLUE CROSS/BLUE SHIELD | Admitting: Psychology

## 2016-08-21 ENCOUNTER — Encounter (HOSPITAL_COMMUNITY): Payer: Self-pay | Admitting: Psychology

## 2016-08-21 NOTE — Progress Notes (Signed)
Aldean JewettMelinda D Nickey is a 31 y.o. female patient who didn't show for appointment.  Letter sent.        Forde RadonYATES,Montague Corella, LPC

## 2016-09-03 ENCOUNTER — Telehealth (HOSPITAL_COMMUNITY): Payer: Self-pay

## 2016-09-03 NOTE — Telephone Encounter (Signed)
Patient is calling to see if it would be okay to increase her Lamictal. Please review and advise, thank you

## 2016-09-04 ENCOUNTER — Ambulatory Visit (HOSPITAL_COMMUNITY): Payer: Self-pay | Admitting: Psychology

## 2016-09-04 MED ORDER — LAMOTRIGINE 150 MG PO TABS: 150.0000 mg | ORAL_TABLET | Freq: Every day | ORAL | 2 refills | Status: DC

## 2016-09-04 NOTE — Telephone Encounter (Signed)
I sent a new prescription to the pharmacy for 150 mg and called the patient to let her know

## 2016-09-04 NOTE — Telephone Encounter (Signed)
She can try Lamictal 150.  If rash develop then she need to stop the medication

## 2016-09-08 ENCOUNTER — Emergency Department (HOSPITAL_COMMUNITY)
Admission: EM | Admit: 2016-09-08 | Discharge: 2016-09-08 | Disposition: A | Discharge status: Home / Self Care | Payer: BLUE CROSS/BLUE SHIELD | Attending: Emergency Medicine | Admitting: Emergency Medicine

## 2016-09-08 ENCOUNTER — Encounter (HOSPITAL_COMMUNITY): Payer: Self-pay | Admitting: Emergency Medicine

## 2016-09-08 DIAGNOSIS — X500XXA Overexertion from strenuous movement or load, initial encounter: Secondary | ICD-10-CM | POA: Insufficient documentation

## 2016-09-08 DIAGNOSIS — E109 Type 1 diabetes mellitus without complications: Secondary | ICD-10-CM | POA: Diagnosis not present

## 2016-09-08 DIAGNOSIS — Z87891 Personal history of nicotine dependence: Secondary | ICD-10-CM | POA: Insufficient documentation

## 2016-09-08 DIAGNOSIS — Y93E2 Activity, laundry: Secondary | ICD-10-CM | POA: Diagnosis not present

## 2016-09-08 DIAGNOSIS — Z79899 Other long term (current) drug therapy: Secondary | ICD-10-CM | POA: Insufficient documentation

## 2016-09-08 DIAGNOSIS — S29012A Strain of muscle and tendon of back wall of thorax, initial encounter: Secondary | ICD-10-CM | POA: Diagnosis not present

## 2016-09-08 DIAGNOSIS — Y92009 Unspecified place in unspecified non-institutional (private) residence as the place of occurrence of the external cause: Secondary | ICD-10-CM | POA: Diagnosis not present

## 2016-09-08 DIAGNOSIS — S29019A Strain of muscle and tendon of unspecified wall of thorax, initial encounter: Secondary | ICD-10-CM

## 2016-09-08 DIAGNOSIS — Y999 Unspecified external cause status: Secondary | ICD-10-CM | POA: Diagnosis not present

## 2016-09-08 DIAGNOSIS — S299XXA Unspecified injury of thorax, initial encounter: Secondary | ICD-10-CM | POA: Diagnosis present

## 2016-09-08 MED ORDER — NAPROXEN 500 MG PO TABS
500.0000 mg | ORAL_TABLET | Freq: Once | ORAL | Status: AC
  Administered 2016-09-08: 500 mg via ORAL
  Filled 2016-09-08: qty 1

## 2016-09-08 MED ORDER — NAPROXEN 500 MG PO TABS: 500.0000 mg | ORAL_TABLET | Freq: Two times a day (BID) | ORAL | 0 refills | Status: DC

## 2016-09-08 MED ORDER — METHOCARBAMOL 500 MG PO TABS: 500.0000 mg | ORAL_TABLET | Freq: Every evening | ORAL | 0 refills | Status: DC | PRN

## 2016-09-08 MED ORDER — METHOCARBAMOL 500 MG PO TABS
500.0000 mg | ORAL_TABLET | Freq: Once | ORAL | Status: AC
  Administered 2016-09-08: 500 mg via ORAL
  Filled 2016-09-08: qty 1

## 2016-09-08 NOTE — ED Triage Notes (Signed)
Pt reports sharp, stabbing pain in r/mid back. Pt was lifting a basket of laundry and felt stabbing sensation in r/mid to upper back. Denies hx of back pain

## 2016-09-08 NOTE — Discharge Instructions (Signed)
Take Naproxen twice daily for the next week. Take this medicine with food Take muscle relaxer at bedtime to help you sleep. This medicine makes you drowsy so do not take before driving or work You can also try a Lidocaine 4% patch which is over the counter (Salonpas, Aspercreme, IcyHot, etc) Use a heating pad for sore muscles - use for 20 minutes several times a day Return for worsening symptoms

## 2016-09-08 NOTE — ED Provider Notes (Signed)
[facility]-EMERGENCY DEPT Provider Note   CSN: [id number] Arrival date & time: 09/08/16  1620  By signing my name below, I, Vista Minkobert Ross, attest that this documentation has been prepared under the direction and in the presence of YahooKelly Emil Weigold PA-C.  Electronically Signed: Vista Minkobert Ross, ED Scribe. 09/08/16. 5:08 PM.  History   Chief Complaint Chief Complaint  Patient presents with  . Back Pain    HPI HPI Comments: Sierra Schneider is a 31 y.o. female who presents to the Emergency Department complaining of sudden onset right sided back pain s/p an injury that occurred this afternoon. Pt was at her house and leaned down to pick up a laundry basket and felt a stabbing sensation to right mid-back. She reports that her pain is exacerbated during certain movements such as moving her arms. She has no Hx of back problems. Pt has been able to ambulate normally. She denies any shoulder pain, weakness in lower extremities.  The history is provided by the patient. No language interpreter was used.    Past Medical History:  Diagnosis Date  . Depression   . Diabetes mellitus 08/2009   late onset type 1  . Diabetes mellitus type I [facility](HCC)     Patient Active Problem List   Diagnosis Date Noted  . IUD (intrauterine device) in place 04/10/2016  . Insomnia 03/20/2016  . General counseling and advice on female contraception 03/20/2016  . Idiopathic edema 05/10/2015  . Hidradenitis axillaris 03/16/2014  . Generalized anxiety disorder 06/29/2013  . Pure hypercholesterolemia 01/03/2013  . Depression 12/01/2010  . Uncontrolled type 1 diabetes mellitus (HCC) 09/14/2009    Past Surgical History:  Procedure Laterality Date  . CESAREAN SECTION    . ESOPHAGOGASTRODUODENOSCOPY Left 10/14/2012   Procedure: ESOPHAGOGASTRODUODENOSCOPY (EGD);  Surgeon: Vertell NovakJames L Edwards Jr., MD;  [facility];  Service: Endoscopy;  Laterality: Left;  . WISDOM TOOTH EXTRACTION      OB History    Gravida Para Term  Preterm AB Living   2       0 2   SAB TAB Ectopic Multiple Live Births       0           Home Medications    Prior to Admission medications   Medication Sig Start Date End Date Taking? Authorizing Provider  buPROPion (WELLBUTRIN XL) 300 MG 24 hr tablet Take 1 tablet (300 mg total) by mouth daily. 07/25/16   Beaulah DinningGambino, Christina M, MD  lamoTRIgine (LAMICTAL) 150 MG tablet Take 1 tablet (150 mg total) by mouth daily. 09/04/16 09/04/17  Arfeen, Phillips GroutSyed T, MD  Melatonin 1 MG CAPS Take 1 capsule (1 mg total) by mouth at bedtime. 03/20/16   Beaulah DinningGambino, Christina M, MD  NOVOLOG 100 UNIT/ML injection INJECT 80 UNITS PER INSULIN PUMP MAX DAILY. 06/26/16   Reather LittlerKumar, Ajay, MD  rosuvastatin (CRESTOR) 10 MG tablet TAKE 1 TABLET (10 MG TOTAL) BY MOUTH DAILY. 06/26/16   Reather LittlerKumar, Ajay, MD    Family History Family History  Problem Relation Age of Onset  . Depression Mother   . Hypertension Father   . Hyperlipidemia Father   . Kidney disease Paternal Grandmother   . Diabetes Maternal Grandfather   . ADD / ADHD Brother   . Depression Brother   . Drug abuse Brother   . Depression Maternal Aunt   . Depression Paternal Aunt   . Psychosis Cousin   . Heart disease Neg Hx     Social History Social History  Substance Use Topics  .  Smoking status: Former Smoker    Packs/day: 0.00    Quit date: 01/26/2014  . Smokeless tobacco: Never Used  . Alcohol use Yes     Comment: 1 drink every 3 months    Allergies   Patient has no known allergies.   Review of Systems Review of Systems  Musculoskeletal: Positive for back pain. Negative for arthralgias (no shoulder pain) and gait problem.  Neurological: Negative for numbness.     Physical Exam Updated Vital Signs BP 115/82   Pulse 90   Temp 98.3 F (36.8 C) (Oral)   Resp 20   Wt 150 lb (68 kg)   SpO2 98%   BMI 25.75 kg/m   Physical Exam  Constitutional: She is oriented to person, place, and time. She appears well-developed and well-nourished. No distress.    Sitting upright in chair in stiff position  HENT:  Head: Normocephalic and atraumatic.  Neck: Normal range of motion.  Pulmonary/Chest: Effort normal.  Musculoskeletal: She exhibits tenderness. She exhibits no deformity.  Back: Inspection: No masses, deformity, or rash Palpation: No midline spinal tenderness. Tenderness on the right cervical paraspinal, right trapezius and right thoracic paraspinal muscles Strength: 5/5 in upper and lower extremities Sensation: Intact sensation with light touch in lower extremities bilaterally Gait: Normal gait  Neurological: She is alert and oriented to person, place, and time.  Skin: Skin is warm and dry. She is not diaphoretic.  Psychiatric: She has a normal mood and affect. Judgment normal.  Nursing note and vitals reviewed.    ED Treatments / Results  DIAGNOSTIC STUDIES: Oxygen Saturation is 98% on RA, normal by my interpretation.  COORDINATION OF CARE: 5:07 PM-Discussed treatment plan with pt at bedside and pt agreed to plan.   Labs (all labs ordered are listed, but only abnormal results are displayed) Labs Reviewed - No data to display  EKG  EKG Interpretation None       Radiology No results found.  Procedures Procedures (including critical care time)  Medications Ordered in ED Medications - No data to display   Initial Impression / Assessment and Plan / ED Course  I have reviewed the triage vital signs and the nursing notes.  Pertinent labs & imaging results that were available during my care of the patient were reviewed by me and considered in my medical decision making (see chart for details).  31 year old female with thoracic and upper right sided muscle strain. Discussed supportive measures and will rx NSAIDs and muscle relaxer. Advised return for worsening symptoms.  Final Clinical Impressions(s) / ED Diagnoses   Final diagnoses:  Acute thoracic myofascial strain, initial encounter    New Prescriptions New  Prescriptions   No medications on file   I personally performed the services described in this documentation, which was scribed in my presence. The recorded information has been reviewed and is accurate.     Bethel Born, PA-C 09/08/16 1857    Lorre Nick, MD 09/08/16 [phone]

## 2016-09-25 ENCOUNTER — Other Ambulatory Visit: Payer: Self-pay | Admitting: Endocrinology

## 2016-10-02 ENCOUNTER — Ambulatory Visit (INDEPENDENT_AMBULATORY_CARE_PROVIDER_SITE_OTHER): Payer: BLUE CROSS/BLUE SHIELD | Admitting: Psychology

## 2016-10-02 DIAGNOSIS — F331 Major depressive disorder, recurrent, moderate: Secondary | ICD-10-CM | POA: Diagnosis not present

## 2016-10-02 NOTE — Progress Notes (Signed)
   THERAPIST PROGRESS NOTE  Session Time: 8.11am-9.07am  Participation Level: Active  Behavioral Response: Well GroomedAlertstressed  Type of Therapy: Individual Therapy  Treatment Goals addressed: Diagnosis: MDD and goal 1.  Interventions: CBT and Supportive  Summary: Aldean JewettMelinda D Sauceda is a 31 y.o. female who presents with affect congruent w/ report of stressed and feeling emotional.  Pt reported that in June her 32y/o son was dx w/ Type 1 Diabetes and was in the ICU.  Pt reported on transition for him with this and currently doing well w/- but aware that still in "honeymoon" phase and that will be struggles. Pt is worried for struggles he may have accepting this life long illness and just worry for his health.  Pt is able to refame and identify that herself going through type 1 diabetes and family that have good supports and knowledge to assist him.  Pt is utilizing her supports for coping as well as has been more emotional.    Suicidal/Homicidal: Nowithout intent/plan  Therapist Response: Assessed pt current functioning per pt report.  Processed w/pt stressor of illness dx in son.  Assisted pt in identify distortions and reframing as well as seeing support system in place for this family transition.   Plan: Return again in 4 weeks.  Diagnosis: MDD   Forde RadonYATES,Laneah Luft, [facility] 10/02/2016

## 2016-10-27 ENCOUNTER — Other Ambulatory Visit: Payer: Self-pay | Admitting: Family Medicine

## 2016-10-31 ENCOUNTER — Other Ambulatory Visit (INDEPENDENT_AMBULATORY_CARE_PROVIDER_SITE_OTHER): Payer: BLUE CROSS/BLUE SHIELD

## 2016-10-31 DIAGNOSIS — E1065 Type 1 diabetes mellitus with hyperglycemia: Secondary | ICD-10-CM

## 2016-10-31 LAB — BASIC METABOLIC PANEL
BUN: 8 mg/dL (ref 6–23)
CO2: 28 meq/L (ref 19–32)
CREATININE: 0.75 mg/dL (ref 0.40–1.20)
Calcium: 9.4 mg/dL (ref 8.4–10.5)
Chloride: 100 mEq/L (ref 96–112)
GFR: 94.81 mL/min (ref >60.00)
Glucose, Bld: 357 mg/dL — ABNORMAL HIGH (ref 70–99)
POTASSIUM: 4.5 meq/L (ref 3.5–5.1)
SODIUM: 137 meq/L (ref 135–145)

## 2016-10-31 LAB — HEMOGLOBIN A1C: HEMOGLOBIN A1C: 8.1 % — AB (ref 4.6–6.5)

## 2016-11-02 ENCOUNTER — Other Ambulatory Visit: Payer: Self-pay

## 2016-11-06 ENCOUNTER — Encounter: Payer: Self-pay | Admitting: Endocrinology

## 2016-11-06 ENCOUNTER — Ambulatory Visit (INDEPENDENT_AMBULATORY_CARE_PROVIDER_SITE_OTHER): Payer: BLUE CROSS/BLUE SHIELD | Admitting: Endocrinology

## 2016-11-06 ENCOUNTER — Other Ambulatory Visit: Payer: Self-pay

## 2016-11-06 VITALS — BP 100/64 | HR 85 | Ht 64.0 in | Wt 151.6 lb

## 2016-11-06 DIAGNOSIS — E782 Mixed hyperlipidemia: Secondary | ICD-10-CM

## 2016-11-06 DIAGNOSIS — R5383 Other fatigue: Secondary | ICD-10-CM | POA: Diagnosis not present

## 2016-11-06 DIAGNOSIS — E1065 Type 1 diabetes mellitus with hyperglycemia: Secondary | ICD-10-CM

## 2016-11-06 MED ORDER — INSULIN ASPART 100 UNIT/ML ~~LOC~~ SOLN: SUBCUTANEOUS | 2 refills | Status: DC

## 2016-11-06 MED ORDER — ROSUVASTATIN CALCIUM 20 MG PO TABS: 20.0000 mg | ORAL_TABLET | Freq: Every day | ORAL | 3 refills | Status: DC

## 2016-11-06 NOTE — Patient Instructions (Signed)
Reduce carbohydrate coverage at dinnertime to 1: 12  Try to more accurately estimate carbohydrates for meals Add at least 25-40% more insulin for higher fat meals  With using Fiasp bolus right before eating  Do not suspend the pump when the blood sugar is low normal  Keep a record of blood sugars before and after various meals and how much insulin you have taken for the amount of carbs  Crestor dose will be 20 mg

## 2016-11-06 NOTE — Progress Notes (Addendum)
Patient ID: Sierra JewettMelinda D Minkoff, female   DOB: November 29, 1985, 31 y.o.   MRN: [id number]   Reason for Appointment: Diabetes followup:   History of Present Illness   Diagnosis: Type 1 DIABETES MELITUS     DIABETES history: She has had diabetes since about 2011 and has been on insulin pump for several years She had fairly good control in the first 2 years of her disease but blood sugar has been more difficult subsequently  CURRENT insulin pump:  Medtronic 670 G  SETTINGS are:    Basal rate on current download = Midnight = 1.2, 7 AM = 1.4, 10 AM = 0.6, 12 noon = 1.3, 6 PM = 1.05 and 7 PM = 0.9  Total basal insulin = 26 units Carb Ratio: 1:10. Correction factor 1: 30, target 100  Her A1c is higher at 8.1%    Current management, blood sugar patterns and problems identified:  Pump data:  She has been the AUTO mode for 85% the time in the last 2 weeks  The sensor at been used 93% of the time  Most frequent reason for exiting auto mode is auto mode maximum delivery  68% of her readings are within TARGET with 2% low and 30% above 180, this is similar to previous readings   BLOOD SUGAR PATTERNS:  OVERNIGHT blood sugars appear to be starting of higher late at night and nearly 200 on average but appear to be fluctuating less than on her last visit  Blood sugars are gradually declining overnight and fairly stable early morning  Fasting blood sugars are not always checked but are mildly increased  POSTPRANDIAL readings are the highest overall after lunch these are quite variable also  Blood sugars in the evenings after dinner are like to lower with some tendency to low normal or low sugars with lowest glucose 57   PROBLEMS identified:  She is periodically eating out especially in the last couple of weeks and this makes her sugars higher at lunchtime  She does not appear to be correctly calculating carbohydrates at times such as when she was eating lunch yesterday with an  entry of only 33 g carbohydrates when she had a sandwich, chips and yogurt  She thinks she is bolusing right before eating fairly consistently  She may feel her blood sugar getting slightly low around suppertime and then may suspend her pump manually  If her blood sugars are low normal at this time she will tend to snack and not bolus appropriately which causes higher readings later in the evening  CORRECTION boluses occasionally tend to make her blood sugar lower done after eating  Higher fat meals at lunchtime tend to cause more prolonged hyperglycemia in the afternoon at times    Wt Readings from Last 3 Encounters:  11/06/16 151 lb 9.6 oz (68.8 kg)  09/08/16 150 lb (68 kg)  08/06/16 151 lb 9.6 oz (68.8 kg)     LABS:  Lab Results  Component Value Date   HGBA1C 8.1 (H) 10/31/2016   HGBA1C 7.7 (H) 08/03/2016   HGBA1C 7.3 (H) 04/23/2016   Lab Results  Component Value Date   MICROALBUR 6.3 (H) 04/23/2016   LDLCALC 51 04/23/2016   CREATININE 0.75 10/31/2016    OTHER active problems discussed today: See review of systems    Allergies as of 11/06/2016   No Known Allergies     Medication List       Accurate as of 11/06/16  9:46 PM. Always use  your most recent med list.          buPROPion 300 MG 24 hr tablet Commonly known as:  WELLBUTRIN XL TAKE 1 TABLET BY MOUTH EVERY DAY   lamoTRIgine 150 MG tablet Commonly known as:  LAMICTAL Take 1 tablet (150 mg total) by mouth daily.   Melatonin 1 MG Caps Take 1 capsule (1 mg total) by mouth at bedtime.   methocarbamol 500 MG tablet Commonly known as:  ROBAXIN Take 1 tablet (500 mg total) by mouth at bedtime and may repeat dose one time if needed.   naproxen 500 MG tablet Commonly known as:  NAPROSYN Take 1 tablet (500 mg total) by mouth 2 (two) times daily.   NOVOLOG 100 UNIT/ML injection Generic drug:  insulin aspart INJECT 80 UNITS PER INSULIN PUMP MAX DAILY.   insulin aspart 100 UNIT/ML injection Commonly  known as:  FIASP Use 80 units per insulin pump per day.   rosuvastatin 20 MG tablet Commonly known as:  CRESTOR Take 1 tablet (20 mg total) by mouth daily.       Allergies: No Known Allergies  Past Medical History:  Diagnosis Date  . Depression   . Diabetes mellitus 08/2009   late onset type 1  . Diabetes mellitus type I [facility])     Past Surgical History:  Procedure Laterality Date  . CESAREAN SECTION    . ESOPHAGOGASTRODUODENOSCOPY Left 10/14/2012   Procedure: ESOPHAGOGASTRODUODENOSCOPY (EGD);  Surgeon: Vertell Novak., MD;  Location[facility]COPY;  Service: Endoscopy;  Laterality: Left;  . WISDOM TOOTH EXTRACTION      Family History  Problem Relation Age of Onset  . Depression Mother   . Hypertension Father   . Hyperlipidemia Father   . Kidney disease Paternal Grandmother   . Diabetes Maternal Grandfather   . ADD / ADHD Brother   . Depression Brother   . Drug abuse Brother   . Depression Maternal Aunt   . Depression Paternal Aunt   . Psychosis Cousin   . Heart disease Neg Hx     Social History:  reports that she quit smoking about 2 years ago. She smoked 0.00 packs per day. She has never used smokeless tobacco. She reports that she drinks alcohol. She reports that she does not use drugs.  REVIEW of systems:   She has had mild HYPERCHOLESTEROLEMIA,  Because of her family history of hypercholesterolemia and her diabetes she was previously on pravastatin, this was switched to Crestor 10 mg baseline of LDL of 146  LDL improved as follows:  Lab Results  Component Value Date   CHOL 128 04/23/2016   CHOL 149 08/25/2015   CHOL 214 (H) 07/11/2015   Lab Results  Component Value Date   HDL 46.10 04/23/2016   HDL 53.20 08/25/2015   HDL 41.60 07/11/2015   Lab Results  Component Value Date   LDLCALC 51 04/23/2016   LDLCALC 81 08/25/2015   LDLCALC 146 (H) 07/11/2015   Lab Results  Component Value Date   TRIG 153.0 (H) 04/23/2016   TRIG 71.0 08/25/2015    TRIG 132.0 07/11/2015   Lab Results  Component Value Date   CHOLHDL 3 04/23/2016   CHOLHDL 3 08/25/2015   CHOLHDL 5 07/11/2015   Lab Results  Component Value Date   LDLDIRECT 129.4 12/29/2012         EXAM:  BP 100/64   Pulse 85   Ht  (1.626 m)   Wt 151 lb 9.6 oz (68.8 kg)  SpO2 98%   BMI 26.02 kg/m    ASSESSMENT:    See history of present illness for detailed discussion of  current management, blood sugar patterns and problems identified   Her blood sugar control is overall still difficult to control especially postprandial A1c has been getting higher and now 8.1 Her highest readings are after lunch and occasionally late evening  Day-to-day management of her diabetes, pump management, diet and boluses were discussed  LIPIDS: Will need follow-up on the next visit  Recommendations:   Reduce carbohydrate coverage at dinnertime to 1: 12  Try to more accurately estimate carbohydrates for meals Add at least 25-40% more insulin for higher fat meals  Trial of using Fiasp instead of NovoLog, discussed faster action of this insulin for better postprandial control given her relatively higher early peaks and hopefully reduce tendency to relatively lower sugars 3-4 hours later  With using Fiasp bolus right before eating  Do not suspend the pump when the blood sugar is low normal  Will be consider changing her active insulin time on the next visit  Keep a record of blood sugars before and after various meals and how much insulin you have taken for the amount of carbs  Crestor dose will be 20 mg   Patient Instructions  Reduce carbohydrate coverage at dinnertime to 1: 12  Try to more accurately estimate carbohydrates for meals Add at least 25-40% more insulin for higher fat meals  With using Fiasp bolus right before eating  Do not suspend the pump when the blood sugar is low normal  Keep a record of blood sugars before and after various meals and how much  insulin you have taken for the amount of carbs  Crestor dose will be 20 mg      Counseling time on subjects discussed above is over 50% of today's 25 minute visit   Fermin Yan 11/06/2016, 9:46 PM

## 2016-11-07 ENCOUNTER — Other Ambulatory Visit (HOSPITAL_COMMUNITY): Payer: Self-pay | Admitting: Psychiatry

## 2016-11-07 ENCOUNTER — Telehealth: Payer: Self-pay | Admitting: Endocrinology

## 2016-11-07 ENCOUNTER — Other Ambulatory Visit: Payer: Self-pay | Admitting: Endocrinology

## 2016-11-07 ENCOUNTER — Encounter (HOSPITAL_COMMUNITY): Payer: Self-pay | Admitting: Psychiatry

## 2016-11-07 ENCOUNTER — Ambulatory Visit (INDEPENDENT_AMBULATORY_CARE_PROVIDER_SITE_OTHER): Payer: BLUE CROSS/BLUE SHIELD | Admitting: Psychiatry

## 2016-11-07 VITALS — BP 104/70 | HR 82 | Ht 64.0 in | Wt 152.0 lb

## 2016-11-07 DIAGNOSIS — Z813 Family history of other psychoactive substance abuse and dependence: Secondary | ICD-10-CM

## 2016-11-07 DIAGNOSIS — R450 Nervousness: Secondary | ICD-10-CM | POA: Diagnosis not present

## 2016-11-07 DIAGNOSIS — F331 Major depressive disorder, recurrent, moderate: Secondary | ICD-10-CM | POA: Diagnosis not present

## 2016-11-07 DIAGNOSIS — Z566 Other physical and mental strain related to work: Secondary | ICD-10-CM | POA: Diagnosis not present

## 2016-11-07 DIAGNOSIS — Z975 Presence of (intrauterine) contraceptive device: Secondary | ICD-10-CM | POA: Diagnosis not present

## 2016-11-07 DIAGNOSIS — F419 Anxiety disorder, unspecified: Secondary | ICD-10-CM | POA: Diagnosis not present

## 2016-11-07 DIAGNOSIS — Z818 Family history of other mental and behavioral disorders: Secondary | ICD-10-CM

## 2016-11-07 DIAGNOSIS — Z794 Long term (current) use of insulin: Secondary | ICD-10-CM

## 2016-11-07 DIAGNOSIS — E119 Type 2 diabetes mellitus without complications: Secondary | ICD-10-CM | POA: Diagnosis not present

## 2016-11-07 DIAGNOSIS — Z87891 Personal history of nicotine dependence: Secondary | ICD-10-CM | POA: Diagnosis not present

## 2016-11-07 MED ORDER — HYDROXYZINE PAMOATE 25 MG PO CAPS: ORAL_CAPSULE | ORAL | 0 refills | Status: DC

## 2016-11-07 MED ORDER — LAMOTRIGINE 200 MG PO TABS: 200.0000 mg | ORAL_TABLET | Freq: Every day | ORAL | 1 refills | Status: DC

## 2016-11-07 NOTE — Telephone Encounter (Signed)
Called patient and left a voice message to let her know the message from Dr. Lucianne MussKumar and to please call us back to let us know that she is going to take half of the 20 mg Crestor and will leave the name and number to her eye doctor so I can get her results from them.

## 2016-11-07 NOTE — Progress Notes (Signed)
BH MD/PA/NP OP Progress Note  11/07/2016 9:42 AM Sierra Schneider  MRN:  [id number]  Chief Complaint: I am under a lot of stress.  My 31 year old diagnosed with diabetes. Chief Complaint    Follow-up     HPI: Sierra AlcideMelinda came for her follow-up appointment.  She is under a lot of stress because her 32 year old son diagnosed with diabetes.  Patient told her son has a having a hard time accepting the illness and sometime he does not take insulin injection.  Patient told her son is not qualifier for insulin pump for another 6 months.  She is trying to get him into counseling but sometime it is very challenging.  Patient appears very emotional, tearful and crying.  She also endorse job is very stressful and sometimes she has to work on the weekends.  She is working as a Estate agentforklift operator .  She recently seen her endocrinologist and she was not happy because her hemoglobin A1c jumped 28.1.  She is taking her medication to control her diabetes but she believes stress may not helping her to control diabetes.  She liked the Lamictal which was increased a few weeks ago and now she is taking 150 mg.  She has no rash, itching, tremors or shakes.  She endorsed poor sleep, racing thoughts and sometime feeling hopeless and helpless.  Though she denies any suicidal thoughts or homicidal thought but admitted anxious, nervous.  She also gained 2 pounds and she is not happy about it.  She denies any agitation, anger, mania or any psychosis.  She lives with her 2 children and fianc fianc who is supportive.  Her energy level is fair.  Patient denies drinking alcohol or using any illegal substances.     Visit Diagnosis:    ICD-10-CM   1. Moderate episode of recurrent major depressive disorder (HCC) F33.1 hydrOXYzine (VISTARIL) 25 MG capsule    lamoTRIgine (LAMICTAL) 200 MG tablet    DISCONTINUED: lamoTRIgine (LAMICTAL) 200 MG tablet    DISCONTINUED: hydrOXYzine (VISTARIL) 25 MG capsule    Past Psychiatric History:  Reviewed. Patient had history of depression when her daughter was born.  She took the medication from her primary care physician with little help.  She remembered taking Prozac, BuSpar, Effexor, Zoloft and Cymbalta.  Patient denies any history of suicidal attempt or any psychiatric inpatient treatment.  She denies any history of mania psychosis or any hallucination  Past Medical History:  Past Medical History:  Diagnosis Date  . Depression   . Diabetes mellitus 08/2009   late onset type 1  . Diabetes mellitus type I [facility])     Past Surgical History:  Procedure Laterality Date  . CESAREAN SECTION    . ESOPHAGOGASTRODUODENOSCOPY Left 10/14/2012   Procedure: ESOPHAGOGASTRODUODENOSCOPY (EGD);  Surgeon: Vertell NovakJames L Edwards Jr., MD;  [facility];  Service: Endoscopy;  Laterality: Left;  . WISDOM TOOTH EXTRACTION      Family Psychiatric History: Reviewed.  Family History:  Family History  Problem Relation Age of Onset  . Depression Mother   . Hypertension Father   . Hyperlipidemia Father   . Kidney disease Paternal Grandmother   . Diabetes Maternal Grandfather   . ADD / ADHD Brother   . Depression Brother   . Drug abuse Brother   . Depression Maternal Aunt   . Depression Paternal Aunt   . Psychosis Cousin   . Heart disease Neg Hx     Social History:  Social History   Social History  .  Marital status: Single    Spouse name: N/A  . Number of children: N/A  . Years of education: N/A   Social History Main Topics  . Smoking status: Former Smoker    Packs/day: 0.00    Quit date: 01/26/2014  . Smokeless tobacco: Never Used  . Alcohol use Yes     Comment: 1 drink every 3 months  . Drug use: No  . Sexual activity: Yes    Birth control/ protection: IUD     Comment: mirena   Other Topics Concern  . None   Social History Narrative   Patient goes to school full-time at [employer]. Patient works part-time at [employer]. Is engaged. Having relationship  difficulties. Has 2 children-ages 2 and 8.    Allergies: No Known Allergies  Metabolic Disorder Labs: Lab Results  Component Value Date   HGBA1C 8.1 (H) 10/31/2016   MPG 189 (H) 03/13/2014   MPG 189 (H) 04/10/2013   No results found for: PROLACTIN Lab Results  Component Value Date   CHOL 128 04/23/2016   TRIG 153.0 (H) 04/23/2016   HDL 46.10 04/23/2016   CHOLHDL 3 04/23/2016   VLDL 30.6 04/23/2016   LDLCALC 51 04/23/2016   LDLCALC 81 08/25/2015   Lab Results  Component Value Date   TSH 1.26 05/07/2016   TSH 0.89 09/28/2015    Therapeutic Level Labs: No results found for: LITHIUM No results found for: VALPROATE No components found for:  CBMZ  Current Medications: Current Outpatient Prescriptions  Medication Sig Dispense Refill  . buPROPion (WELLBUTRIN XL) 300 MG 24 hr tablet TAKE 1 TABLET BY MOUTH EVERY DAY 30 tablet 3  . insulin aspart (FIASP) 100 UNIT/ML injection Use 80 units per insulin pump per day. 30 mL 2  . lamoTRIgine (LAMICTAL) 200 MG tablet Take 1 tablet (200 mg total) by mouth daily. 30 tablet 1  . naproxen (NAPROSYN) 500 MG tablet Take 1 tablet (500 mg total) by mouth 2 (two) times daily. 30 tablet 0  . NOVOLOG 100 UNIT/ML injection INJECT 80 UNITS PER INSULIN PUMP MAX DAILY. 30 mL 2  . rosuvastatin (CRESTOR) 20 MG tablet Take 1 tablet (20 mg total) by mouth daily. 30 tablet 3  . hydrOXYzine (VISTARIL) 25 MG capsule Take 1-2 capsule at bed time for insonia 60 capsule 0   No current facility-administered medications for this visit.      Musculoskeletal: Strength & Muscle Tone: within normal limits Gait & Station: normal Patient leans: N/A  Psychiatric Specialty Exam: Review of Systems  Constitutional: Negative.  Negative for weight loss.  HENT: Negative.   Respiratory: Negative.   Gastrointestinal: Negative.   Genitourinary: Negative.   Musculoskeletal: Negative.   Skin: Negative for itching and rash.  Neurological: Negative.    Psychiatric/Behavioral: Positive for depression. The patient is nervous/anxious.     Blood pressure 104/70, pulse 82, height  (1.626 m), weight 152 lb (68.9 kg).Body mass index is 26.09 kg/m.  General Appearance: Casual and Tearful  Eye Contact:  Good  Speech:  Clear and Coherent  Volume:  Normal  Mood:  Anxious and Dysphoric  Affect:  Constricted and Depressed  Thought Process:  Goal Directed  Orientation:  Full (Time, Place, and Person)  Thought Content: Rumination   Suicidal Thoughts:  No  Homicidal Thoughts:  No  Memory:  Immediate;   Good Recent;   Good Remote;   Good  Judgement:  Good  Insight:  Good  Psychomotor Activity:  Normal  Concentration:  Concentration: Fair and Attention Span: Fair  Recall:  Good  Fund of Knowledge: Good  Language: Good  Akathisia:  No  Handed:  Right  AIMS (if indicated): not done  Assets:  Communication Skills Desire for Improvement Housing Resilience Social Support Talents/Skills  ADL's:  Intact  Cognition: WNL  Sleep:  Fair   Screenings: GAD-7     Counselor from 07/12/2016 in [facility] [place]  Total GAD-7 Score  15    PHQ2-9     Counselor from 07/12/2016 in BEHAVIORAL HEALTH OUTPATIENT THERAPY [place] Office Visit from 06/26/2016 in [facility] Office Visit from 05/07/2016 in [facility] Office Visit from 04/05/2016 in [facility] Office Visit from 03/20/2016 in [facility]  PHQ-2 Total Score  0  2  PHQ-9 Total Score  13  -  -  -  -       Assessment and Plan: Major depressive disorder, recurrent.  Generalized anxiety disorder.  Reassurance given.  Recommended to try Lamictal 200 mg since she is tolerating very well.  Reminded about rash in that case she needed to stop the medication immediately.  She is getting Wellbutrin from her primary care physician.  I will add low-dose Vistaril to help anxiety  and insomnia.  She is seeing Glena Norfolk for counseling and encourage her to continue that.  Discuss safety concern that anytime having active suicidal thoughts or homicidal.  Continue to call 911 or go to the local emergency room.  Follow-up in 2 months.  Time spent 25 minutes.   ARFEEN,SYED T., MD 11/07/2016, 9:42 AM

## 2016-11-07 NOTE — Telephone Encounter (Signed)
It may be best that we call directly to the eye doctor and they can send it to my email. Also please let her know that I misread her lab report for cholesterol which was actually normal.  Her level was okay and she can simply take half of the 20 mg Crestor that was sent

## 2016-11-07 NOTE — Telephone Encounter (Signed)
Do you want this sent to your [place] email? Please advise.

## 2016-11-07 NOTE — Telephone Encounter (Signed)
Patient called in reference to Dr. Lucianne MussKumar wanting eye exam records sent over. Patient stated there is no way for her eye doctor to print out records. Patient would like to know if there is an e-mail address this can be sent to. Please call patient and advise.

## 2016-11-08 MED ORDER — BUPROPION HCL ER (XL) 300 MG PO TB24: 300.0000 mg | ORAL_TABLET | Freq: Every day | ORAL | 3 refills | Status: DC

## 2016-11-08 NOTE — Telephone Encounter (Signed)
Called patient and left another message to make sure she received the message about taking half of the 20 mg Crestor and to let us know who her eye doctor is.

## 2016-11-08 NOTE — Addendum Note (Signed)
Addended by: Clovis PuMARTIN, Bellany Elbaum L on: 11/08/2016 09:19 AM   Modules accepted: Orders

## 2016-11-08 NOTE — Telephone Encounter (Signed)
Refill for 90 day supply.  Martin, Tamika L, RN  

## 2016-11-20 ENCOUNTER — Encounter: Payer: Self-pay | Admitting: Neurology

## 2016-11-22 ENCOUNTER — Other Ambulatory Visit: Payer: Self-pay | Admitting: *Deleted

## 2016-11-22 DIAGNOSIS — G5601 Carpal tunnel syndrome, right upper limb: Secondary | ICD-10-CM

## 2016-11-28 ENCOUNTER — Other Ambulatory Visit (HOSPITAL_COMMUNITY): Payer: Self-pay | Admitting: Psychiatry

## 2016-11-29 ENCOUNTER — Other Ambulatory Visit (HOSPITAL_COMMUNITY): Payer: Self-pay | Admitting: Psychiatry

## 2016-11-30 ENCOUNTER — Other Ambulatory Visit: Payer: Self-pay

## 2016-11-30 ENCOUNTER — Ambulatory Visit (INDEPENDENT_AMBULATORY_CARE_PROVIDER_SITE_OTHER): Payer: BLUE CROSS/BLUE SHIELD | Admitting: Neurology

## 2016-11-30 DIAGNOSIS — G5601 Carpal tunnel syndrome, right upper limb: Secondary | ICD-10-CM | POA: Diagnosis not present

## 2016-11-30 NOTE — Procedures (Signed)
[facility]  [address] [place], [address]  [place], [place] [address] Tel: [phone] Fax:  [phone] Test Date:  11/30/2016  Patient: Sierra Schneider DOB: [birth year] Physician: Nita Sickle, DO  Sex: Female Height:  Ref Phys: Kristeen Miss, MD  ID#: [id number] Temp: 36.4C Technician:    Patient Complaints: This is a 31 year old female referred for evaluation of right arm pain and paresthesias.  NCV & EMG Findings: Extensive electrodiagnostic testing of the right upper extremity shows:  1. Right median, ulnar, and mixed palmar sensory responses are within normal limits. 2. Right median and ulnar motor responses are within normal limits. 3. There is no evidence of active or chronic motor axon loss changes affecting any of the tested muscles. Motor unit configuration and recruitment pattern is within normal limits.  Impression: This is a normal study of the right upper extremity. In particular, there is no evidence of a carpal tunnel syndrome, ulnar neuropathy, or cervical radiculopathy.   ___________________________ Nita Sickle, DO    Nerve Conduction Studies Anti Sensory Summary Table   Site NR Peak (ms) Norm Peak (ms) P-T Amp (V) Norm P-T Amp  Right Median Anti Sensory (2nd Digit)  Wrist    2.4 <3.4 37.9 >20  Right Ulnar Anti Sensory (5th Digit)  Wrist    2.4 <3.1 22.9 >12   Motor Summary Table   Site NR Onset (ms) Norm Onset (ms) O-P Amp (mV) Norm O-P Amp Site1 Site2 Delta-0 (ms) Dist (cm) Vel (m/s) Norm Vel (m/s)  Right Median Motor (Abd Poll Brev)  Wrist    2.9 <3.9 11.5 >6 Elbow Wrist 5.1 30.0 59 >50  Elbow    8.0  11.4         Right Ulnar Motor (Abd Dig Minimi)  Wrist    1.9 <3.1 10.5 >7 B Elbow Wrist 3.3 21.5 65 >50  B Elbow    5.2  10.4  A Elbow B Elbow 1.4 10.0 71 >50  A Elbow    6.6  10.0          Comparison Summary Table   Site NR Peak (ms) Norm Peak (ms) P-T Amp (V) Site1 Site2 Delta-P (ms) Norm Delta (ms)  Right Median/Ulnar Palm  Comparison (Wrist - 8cm)  Median Palm    1.3 <2.2 47.5 Median Palm Ulnar Palm 0.0   Ulnar Palm    1.3 <2.2 16.4       EMG   Side Muscle Ins Act Fibs Psw Fasc Number Recrt Dur Dur. Amp Amp. Poly Poly. Comment  Right 1stDorInt Nml Nml Nml Nml Nml Nml Nml Nml Nml Nml Nml Nml N/A  Right Ext Indicis Nml Nml Nml Nml Nml Nml Nml Nml Nml Nml Nml Nml N/A  Right PronatorTeres Nml Nml Nml Nml Nml Nml Nml Nml Nml Nml Nml Nml N/A  Right Biceps Nml Nml Nml Nml Nml Nml Nml Nml Nml Nml Nml Nml N/A  Right Triceps Nml Nml Nml Nml Nml Nml Nml Nml Nml Nml Nml Nml N/A  Right Deltoid Nml Nml Nml Nml Nml Nml Nml Nml Nml Nml Nml Nml N/A  Right FlexCarpiUln Nml Nml Nml Nml Nml Nml Nml Nml Nml Nml Nml Nml N/A      Waveforms:

## 2016-12-05 ENCOUNTER — Encounter (HOSPITAL_COMMUNITY): Payer: Self-pay | Admitting: Psychology

## 2016-12-05 ENCOUNTER — Ambulatory Visit (HOSPITAL_COMMUNITY): Payer: Self-pay | Admitting: Psychology

## 2016-12-05 NOTE — Progress Notes (Signed)
Sierra Schneider is a 31 y.o. female patient who didn't show for her appointment.  Letter sent.        Forde Radon, LPC

## 2016-12-13 ENCOUNTER — Encounter: Payer: Self-pay | Admitting: Neurology

## 2016-12-19 ENCOUNTER — Ambulatory Visit (INDEPENDENT_AMBULATORY_CARE_PROVIDER_SITE_OTHER): Payer: BLUE CROSS/BLUE SHIELD | Admitting: Psychiatry

## 2016-12-19 ENCOUNTER — Encounter (HOSPITAL_COMMUNITY): Payer: Self-pay | Admitting: Psychiatry

## 2016-12-19 VITALS — BP 105/62 | HR 94 | Ht 64.0 in | Wt 149.4 lb

## 2016-12-19 DIAGNOSIS — Z79899 Other long term (current) drug therapy: Secondary | ICD-10-CM | POA: Diagnosis not present

## 2016-12-19 DIAGNOSIS — Z87891 Personal history of nicotine dependence: Secondary | ICD-10-CM | POA: Diagnosis not present

## 2016-12-19 DIAGNOSIS — F411 Generalized anxiety disorder: Secondary | ICD-10-CM | POA: Diagnosis not present

## 2016-12-19 DIAGNOSIS — Z818 Family history of other mental and behavioral disorders: Secondary | ICD-10-CM

## 2016-12-19 DIAGNOSIS — F331 Major depressive disorder, recurrent, moderate: Secondary | ICD-10-CM | POA: Diagnosis not present

## 2016-12-19 MED ORDER — LAMOTRIGINE 200 MG PO TABS: 200.0000 mg | ORAL_TABLET | Freq: Every day | ORAL | 0 refills | Status: DC

## 2016-12-19 MED ORDER — BUPROPION HCL ER (XL) 300 MG PO TB24: 300.0000 mg | ORAL_TABLET | Freq: Every day | ORAL | 0 refills | Status: DC

## 2016-12-19 MED ORDER — HYDROXYZINE PAMOATE 50 MG PO CAPS: 50.0000 mg | ORAL_CAPSULE | Freq: Every day | ORAL | 0 refills | Status: DC

## 2016-12-19 NOTE — Progress Notes (Signed)
BH MD/PA/NP OP Progress Note  12/19/2016 8:37 AM Sierra Schneider  MRN:  [id number]  Chief Complaint: I'm feeling better with increase Lamictal.  I'm sleeping good with the Vistaril.  HPI: Sierra Schneider came for her follow-up appointment.  On her last visit we started her on low-dose Vistaril and she is taking 50 mg it is helping her sleep.  She continues to struggle handling her 32 year old son and now her son sees a therapist and she is going with him.  She saw once LeeAnn but now she is more focused on her son's therapy.  She feel Lamictal her irritability and anxiety.  She still have some time crying spells but they are not as bad.  She sleeping better.  Her energy level is good.  She denies any agitation, anger, mania, psychoses or any hallucination.  She denies any feeling of hopelessness or worthlessness.  She denies any panic attacks.  Sometimes she works he went on Sunday so keep herself busy.  Patient is working as a Production designer, theatre/television/film.  She lives with her husband and 2 children.  Her fianc is very supportive. Patient denies drinking alcohol or using any illegal substances.  She recently seen her physician and her insulin is changed to a different preparation.  Her appetite is okay.  Her vital signs are stable.  Visit Diagnosis:    ICD-10-CM   1. Moderate episode of recurrent major depressive disorder (HCC) F33.1 lamoTRIgine (LAMICTAL) 200 MG tablet    hydrOXYzine (VISTARIL) 50 MG capsule    buPROPion (WELLBUTRIN XL) 300 MG 24 hr tablet    Past Psychiatric History: reviewed. Patient had history of depression when her daughter was born. She took the medication from her primary care physician with little help. She remembered taking Prozac, BuSpar, Effexor, Zoloft and Cymbalta. Patient denies any history of suicidal attempt or any psychiatric inpatient treatment. She denies any history of mania psychosis or any hallucination.  Past Medical History:  Past Medical History:  Diagnosis Date   . Depression   . Diabetes mellitus 08/2009   late onset type 1  . Diabetes mellitus type I [facility])     Past Surgical History:  Procedure Laterality Date  . CESAREAN SECTION    . ESOPHAGOGASTRODUODENOSCOPY Left 10/14/2012   Procedure: ESOPHAGOGASTRODUODENOSCOPY (EGD);  Surgeon: Vertell Novak., MD;  Location: [facility] ENDOSCOPY;  Service: Endoscopy;  Laterality: Left;  . WISDOM TOOTH EXTRACTION      Family Psychiatric History: reviewed.  Family History:  Family History  Problem Relation Age of Onset  . Depression Mother   . Hypertension Father   . Hyperlipidemia Father   . Kidney disease Paternal Grandmother   . Diabetes Maternal Grandfather   . ADD / ADHD Brother   . Depression Brother   . Drug abuse Brother   . Depression Maternal Aunt   . Depression Paternal Aunt   . Psychosis Cousin   . Heart disease Neg Hx     Social History:  Social History   Social History  . Marital status: Single    Spouse name: N/A  . Number of children: N/A  . Years of education: N/A   Social History Main Topics  . Smoking status: Former Smoker    Packs/day: 0.00    Quit date: 01/26/2014  . Smokeless tobacco: Never Used  . Alcohol use Yes     Comment: 1 drink every 3 months  . Drug use: No  . Sexual activity: Yes    Birth control/ protection:  IUD     Comment: mirena   Other Topics Concern  . None   Social History Narrative   Patient goes to school full-time at [employer]-studying business. Patient works part-time at [employer]. Is engaged. Having relationship difficulties. Has 2 children-ages 2 and 8.    Allergies: No Known Allergies  Metabolic Disorder Labs: Lab Results  Component Value Date   HGBA1C 8.1 (H) 10/31/2016   MPG 189 (H) 03/13/2014   MPG 189 (H) 04/10/2013   No results found for: PROLACTIN Lab Results  Component Value Date   CHOL 128 04/23/2016   TRIG 153.0 (H) 04/23/2016   HDL 46.10 04/23/2016   CHOLHDL 3 04/23/2016   VLDL 30.6 04/23/2016   LDLCALC 51  04/23/2016   LDLCALC 81 08/25/2015   Lab Results  Component Value Date   TSH 1.26 05/07/2016   TSH 0.89 09/28/2015    Therapeutic Level Labs: No results found for: LITHIUM No results found for: VALPROATE No components found for:  CBMZ  Current Medications: Current Outpatient Prescriptions  Medication Sig Dispense Refill  . buPROPion (WELLBUTRIN XL) 300 MG 24 hr tablet Take 1 tablet (300 mg total) by mouth daily. 90 tablet 3  . hydrOXYzine (VISTARIL) 25 MG capsule Take 1-2 capsule at bed time for insonia 60 capsule 0  . insulin aspart (FIASP) 100 UNIT/ML injection Use 80 units per insulin pump per day. 30 mL 2  . lamoTRIgine (LAMICTAL) 200 MG tablet Take 1 tablet (200 mg total) by mouth daily. 30 tablet 1  . rosuvastatin (CRESTOR) 20 MG tablet Take 1 tablet (20 mg total) by mouth daily. 30 tablet 3  . naproxen (NAPROSYN) 500 MG tablet Take 1 tablet (500 mg total) by mouth 2 (two) times daily. (Patient not taking: Reported on 12/19/2016) 30 tablet 0  . NOVOLOG 100 UNIT/ML injection INJECT 80 UNITS PER INSULIN PUMP MAX DAILY. (Patient not taking: Reported on 12/19/2016) 30 mL 2   No current facility-administered medications for this visit.      Musculoskeletal: Strength & Muscle Tone: within normal limits Gait & Station: normal Patient leans: N/A  Psychiatric Specialty Exam: ROS  Blood pressure 105/62, pulse 94, height 5\' 4"  (1.626 m), weight 149 lb 6.4 oz (67.8 kg).Body mass index is 25.64 kg/m.  General Appearance: Casual  Eye Contact:  Good  Speech:  Clear and Coherent  Volume:  Normal  Mood:  Anxious  Affect:  Appropriate  Thought Process:  Goal Directed  Orientation:  Full (Time, Place, and Person)  Thought Content: Rumination   Suicidal Thoughts:  No  Homicidal Thoughts:  No  Memory:  Immediate;   Good Recent;   Good Remote;   Good  Judgement:  Good  Insight:  Good  Psychomotor Activity:  Normal  Concentration:  Concentration: Good and Attention Span: Good   Recall:  Good  Fund of Knowledge: Good  Language: Good  Akathisia:  No  Handed:  Right  AIMS (if indicated): not done  Assets:  Communication Skills Desire for Improvement Housing Resilience Social Support  ADL's:  Intact  Cognition: WNL  Sleep:  Good   Screenings: GAD-7     Counselor from 07/12/2016 in BEHAVIORAL HEALTH OUTPATIENT THERAPY [place]  Total GAD-7 Score  15    PHQ2-9     Counselor from 07/12/2016 in BEHAVIORAL HEALTH OUTPATIENT THERAPY [place] [facility] from 03/20/2016 in [facility]  PHQ-2 Total Score  3  2  2   0  2  PHQ-9 Total Score  13  -  -  -  -       Assessment and Plan: major depressive disorder, recurrent.  Generalized anxiety disorder.  Patient doing better since we increased the Lamictal.  She has no rash, itching, tremors or shakes.  She's also taking Vistaril 50 mg every night but is helping her sleep.  She like to get Wellbutrin from [place].  At this time she is not interested in counseling because she like to focus on her son's therapy.  We will continue Lamictal 200 mg daily, Vistaril 50 mg at bedtime and Wellbutrin XL 300 mg daily.  Recommended to call us back if she has any question or any concern.  Follow-up in 3 months.   Jvion Turgeon T., MD 12/19/2016, 8:37 AM

## 2016-12-20 ENCOUNTER — Encounter: Payer: Self-pay | Admitting: Neurology

## 2017-02-01 ENCOUNTER — Ambulatory Visit (INDEPENDENT_AMBULATORY_CARE_PROVIDER_SITE_OTHER): Payer: BLUE CROSS/BLUE SHIELD | Admitting: Internal Medicine

## 2017-02-01 ENCOUNTER — Encounter: Payer: Self-pay | Admitting: Internal Medicine

## 2017-02-01 VITALS — BP 100/80 | HR 109 | Temp 97.9°F | Ht 64.0 in | Wt 151.4 lb

## 2017-02-01 DIAGNOSIS — J029 Acute pharyngitis, unspecified: Secondary | ICD-10-CM | POA: Diagnosis not present

## 2017-02-01 DIAGNOSIS — R059 Cough, unspecified: Secondary | ICD-10-CM

## 2017-02-01 DIAGNOSIS — R05 Cough: Secondary | ICD-10-CM | POA: Diagnosis not present

## 2017-02-01 LAB — POCT RAPID STREP A (OFFICE): RAPID STREP A SCREEN: NEGATIVE

## 2017-02-01 MED ORDER — HYDROCODONE-HOMATROPINE 5-1.5 MG/5ML PO SYRP: 5.0000 mL | ORAL_SOLUTION | Freq: Three times a day (TID) | ORAL | 0 refills | Status: DC | PRN

## 2017-02-01 MED ORDER — OXYMETAZOLINE HCL 0.05 % NA SOLN: 1.0000 | Freq: Two times a day (BID) | NASAL | 0 refills | Status: DC

## 2017-02-01 NOTE — Patient Instructions (Addendum)
It was nice meeting you today Juliette AlcideMelinda!  For cough, you can use the Hycodan cough syrup up to every 8 hours. Please be aware that this will make you sleepy.   For nasal congestion, you can use the Afrin nasal spray up to twice a day. DO NOT USE THIS FOR MORE THAN THREE DAYS AT A TIME. You can use plain saline nasal spray to help clear congestion between Afrin sprays.   If you are not feeling better in about a week, please let us know.   If you have any questions or concerns, please feel free to call the clinic.   Be well,  Dr. Natale MilchLancaster

## 2017-02-01 NOTE — Assessment & Plan Note (Addendum)
Likely viral etiology, possibly bronchitis given duration of symptoms. Afebrile and lungs CTAB, making PNA less likely. Generally well-appearing, with normal WOB on RA and O2 sat 99%, so do not think that inpatient management is indicated. With accompanying nasal congestion. Rapid strep negative. As patient unable to sleep, chest pain with coughing, and no improvement with Tessalon perles, feel that Hycodan cough syrup is appropriate at this time. Will also try Afrin for nasal congestion as no relief with other products, however stressed importance of not using for more than three days in a row. Also discussed other conservative home treatments. F/u if no improvement in symptoms.

## 2017-02-01 NOTE — Progress Notes (Signed)
   Subjective:   Patient: Sierra Schneider       Birthdate: 1986/01/07       MRN: [id number]      HPI  Sierra Schneider is a 31 y.o. female presenting for same day appt for cough and nasal congestion.   Cough, nasal congestion Cough present for 78mo. Nasal congestion present x3wks. Cough productive of green sputum. Denies fevers. Does have decreased appetite but is drinking well. No vomiting, but has felt as if she was going to vomit when coughing a couple times. Denies diarrhea. Does have sore throat. Has tried Tylenol Sinus, a generic nighttime cough medicine, and Tessalon perles, which have not been effective. Is having chest pain when coughing now and is unable to sleep at night due to coughing.   Smoking status reviewed. Patient is former smoker.   Review of Systems See HPI.     Objective:  Physical Exam  Constitutional: She is oriented to person, place, and time and well-developed, well-nourished, and in no distress.  HENT:  Head: Normocephalic and atraumatic.  Nose: Nose normal.  Mouth/Throat: Oropharynx is clear and moist. No oropharyngeal exudate.  Eyes: Conjunctivae and EOM are normal. Pupils are equal, round, and reactive to light. Right eye exhibits no discharge. Left eye exhibits no discharge.  Neck: Normal range of motion. Neck supple.  Bilateral cervical lymphadenopathy  Cardiovascular: Normal rate, regular rhythm and normal heart sounds.  No murmur heard. Pulmonary/Chest: Effort normal and breath sounds normal. No respiratory distress. She has no wheezes.  Coughing intermittently throughout encounter. Hoarse voice.   Neurological: She is alert and oriented to person, place, and time.  Skin: Skin is warm and dry.  Psychiatric: Affect and judgment normal.      Assessment & Plan:  Cough Likely viral etiology, possibly bronchitis given duration of symptoms. Afebrile and lungs CTAB, making PNA less likely. Generally well-appearing, with normal WOB on RA and O2 sat 99%,  so do not think that inpatient management is indicated. With accompanying nasal congestion. Rapid strep negative. As patient unable to sleep, chest pain with coughing, and no improvement with Tessalon perles, feel that Hycodan cough syrup is appropriate at this time. Will also try Afrin for nasal congestion as no relief with other products, however stressed importance of not using for more than three days in a row. Also discussed other conservative home treatments. F/u if no improvement in symptoms.    Tarri AbernethyAbigail J [place], MD, MPH PGY-3 [facility] Pager [phone]

## 2017-02-02 ENCOUNTER — Other Ambulatory Visit (HOSPITAL_COMMUNITY): Payer: Self-pay | Admitting: Psychiatry

## 2017-02-02 DIAGNOSIS — F331 Major depressive disorder, recurrent, moderate: Secondary | ICD-10-CM

## 2017-02-05 ENCOUNTER — Encounter: Payer: Self-pay | Admitting: Internal Medicine

## 2017-02-05 ENCOUNTER — Other Ambulatory Visit: Payer: Self-pay

## 2017-02-05 NOTE — Telephone Encounter (Signed)
Pt calling about this situation. Her symptoms since Friday have progressively gotten worse and would like an antibiotic called in. Please advise

## 2017-02-06 ENCOUNTER — Encounter: Payer: Self-pay | Admitting: Internal Medicine

## 2017-02-06 ENCOUNTER — Other Ambulatory Visit: Payer: Self-pay | Admitting: Internal Medicine

## 2017-02-06 MED ORDER — AMOXICILLIN 500 MG PO CAPS: 500.0000 mg | ORAL_CAPSULE | Freq: Two times a day (BID) | ORAL | 0 refills | Status: DC

## 2017-02-06 NOTE — Progress Notes (Signed)
Called patient regarding her worsening symptoms. As patient's symptoms have been ongoing for about 3 weeks and have been worsening since her visit 5 days ago, bacterial etiology higher on differential. Patient also with Type 1 DM, placing her in an immunocompromised state. As such, will treat with course of amoxicillin. Encouraged patient to discontinue Afrin, as she was not supposed to use this for more than 3 days. Can continue using cough syrup and other conservative at home symptomatic measures as discussed at her appt.   Tarri AbernethyAbigail J Hao Dion, MD, MPH PGY-3 [facility] Pager [phone]

## 2017-02-06 NOTE — Telephone Encounter (Signed)
Patient left message on nurse line requesting antibiotic. States she has "gotten a lot worse" since OV on 02/01/2017. Please call Juliette AlcideMelinda at [phone] to discuss. Kinnie FeilL. Xzayvier Fagin, RN, BSN

## 2017-02-06 NOTE — Progress Notes (Deleted)
Patient ID: Sierra Schneider, female   DOB: [birth year], 31 y.o.   MRN: [id number]   Reason for Appointment: Diabetes followup:   History of Present Illness   Diagnosis: Type 1 DIABETES MELITUS     DIABETES history: She has had diabetes since about 2011 and has been on insulin pump for several years She had fairly good control in the first 2 years of her disease but blood sugar has been more difficult subsequently  CURRENT insulin pump:  Medtronic 670 G  SETTINGS are:    Basal rate on current download = Midnight = 1.2, 7 AM = 1.4, 10 AM = 0.6, 12 noon = 1.3, 6 PM = 1.05 and 7 PM = 0.9  Total basal insulin = 26 units Carb Ratio: 1:10. Correction factor 1: 30, target 100  Her A1c is higher at 8.1%    Current management, blood sugar patterns and problems identified:  Pump data:  She has been the AUTO mode for 85% the time in the last 2 weeks  The sensor at been used 93% of the time  Most frequent reason for exiting auto mode is auto mode maximum delivery  68% of her readings are within TARGET with 2% low and 30% above 180, this is similar to previous readings   BLOOD SUGAR PATTERNS:  OVERNIGHT blood sugars appear to be starting of higher late at night and nearly 200 on average but appear to be fluctuating less than on her last visit  Blood sugars are gradually declining overnight and fairly stable early morning  Fasting blood sugars are not always checked but are mildly increased  POSTPRANDIAL readings are the highest overall after lunch these are quite variable also  Blood sugars in the evenings after dinner are like to lower with some tendency to low normal or low sugars with lowest glucose 57   PROBLEMS identified:  She is periodically eating out especially in the last couple of weeks and this makes her sugars higher at lunchtime  She does not appear to be correctly calculating carbohydrates at times such as when she was eating lunch yesterday with an  entry of only 33 g carbohydrates when she had a sandwich, chips and yogurt  She thinks she is bolusing right before eating fairly consistently  She may feel her blood sugar getting slightly low around suppertime and then may suspend her pump manually  If her blood sugars are low normal at this time she will tend to snack and not bolus appropriately which causes higher readings later in the evening  CORRECTION boluses occasionally tend to make her blood sugar lower done after eating  Higher fat meals at lunchtime tend to cause more prolonged hyperglycemia in the afternoon at times    Wt Readings from Last 3 Encounters:  02/01/17 151 lb 6.4 oz (68.7 kg)  11/06/16 151 lb 9.6 oz (68.8 kg)  09/08/16 150 lb (68 kg)     LABS:  Lab Results  Component Value Date   HGBA1C 8.1 (H) 10/31/2016   HGBA1C 7.7 (H) 08/03/2016   HGBA1C 7.3 (H) 04/23/2016   Lab Results  Component Value Date   MICROALBUR 6.3 (H) 04/23/2016   LDLCALC 51 04/23/2016   CREATININE 0.75 10/31/2016    OTHER active problems discussed today: See review of systems    Allergies as of 02/07/2017   No Known Allergies     Medication List        Accurate as of 02/06/17  4:48 PM. Always  use your most recent med list.          amoxicillin 500 MG capsule Commonly known as:  AMOXIL Take 1 capsule (500 mg total) by mouth 2 (two) times daily.   buPROPion 300 MG 24 hr tablet Commonly known as:  WELLBUTRIN XL Take 1 tablet (300 mg total) by mouth daily.   HYDROcodone-homatropine 5-1.5 MG/5ML syrup Commonly known as:  HYCODAN Take 5 mLs by mouth every 8 (eight) hours as needed for cough.   hydrOXYzine 50 MG capsule Commonly known as:  VISTARIL Take 1 capsule (50 mg total) by mouth at bedtime.   insulin aspart 100 UNIT/ML injection Commonly known as:  FIASP Use 80 units per insulin pump per day.   lamoTRIgine 200 MG tablet Commonly known as:  LAMICTAL Take 1 tablet (200 mg total) by mouth daily.     oxymetazoline 0.05 % nasal spray Commonly known as:  AFRIN NASAL SPRAY Place 1 spray into both nostrils 2 (two) times daily. DO NOT USE FOR MORE THAN THREE DAYS.   rosuvastatin 20 MG tablet Commonly known as:  CRESTOR Take 1 tablet (20 mg total) by mouth daily.       Allergies: No Known Allergies  Past Medical History:  Diagnosis Date  . Depression   . Diabetes mellitus 08/2009   late onset type 1  . Diabetes mellitus type I [facility])     Past Surgical History:  Procedure Laterality Date  . CESAREAN SECTION    . ESOPHAGOGASTRODUODENOSCOPY Left 10/14/2012   Procedure: ESOPHAGOGASTRODUODENOSCOPY (EGD);  Surgeon: Vertell Novak., MD;  Location: [facility] ENDOSCOPY;  Service: Endoscopy;  Laterality: Left;  . WISDOM TOOTH EXTRACTION      Family History  Problem Relation Age of Onset  . Depression Mother   . Hypertension Father   . Hyperlipidemia Father   . Kidney disease Paternal Grandmother   . Diabetes Maternal Grandfather   . ADD / ADHD Brother   . Depression Brother   . Drug abuse Brother   . Depression Maternal Aunt   . Depression Paternal Aunt   . Psychosis Cousin   . Heart disease Neg Hx     Social History:  reports that she quit smoking about 3 years ago. She smoked 0.00 packs per day. she has never used smokeless tobacco. She reports that she drinks alcohol. She reports that she does not use drugs.  REVIEW of systems:   She has had mild HYPERCHOLESTEROLEMIA,  Because of her family history of hypercholesterolemia and her diabetes she was previously on pravastatin, this was switched to Crestor 10 mg baseline of LDL of 146  LDL improved as follows:  Lab Results  Component Value Date   CHOL 128 04/23/2016   CHOL 149 08/25/2015   CHOL 214 (H) 07/11/2015   Lab Results  Component Value Date   HDL 46.10 04/23/2016   HDL 53.20 08/25/2015   HDL 41.60 07/11/2015   Lab Results  Component Value Date   LDLCALC 51 04/23/2016   LDLCALC 81 08/25/2015   LDLCALC 146  (H) 07/11/2015   Lab Results  Component Value Date   TRIG 153.0 (H) 04/23/2016   TRIG 71.0 08/25/2015   TRIG 132.0 07/11/2015   Lab Results  Component Value Date   CHOLHDL 3 04/23/2016   CHOLHDL 3 08/25/2015   CHOLHDL 5 07/11/2015   Lab Results  Component Value Date   LDLDIRECT 129.4 12/29/2012         EXAM:  There were no vitals taken  for this visit.   ASSESSMENT:    See history of present illness for detailed discussion of  current management, blood sugar patterns and problems identified   Her blood sugar control is overall still difficult to control especially postprandial A1c has been getting higher and now 8.1 Her highest readings are after lunch and occasionally late evening  Day-to-day management of her diabetes, pump management, diet and boluses were discussed  LIPIDS: Will need follow-up on the next visit  Recommendations:   Reduce carbohydrate coverage at dinnertime to 1: 12  Try to more accurately estimate carbohydrates for meals Add at least 25-40% more insulin for higher fat meals  Trial of using Fiasp instead of NovoLog, discussed faster action of this insulin for better postprandial control given her relatively higher early peaks and hopefully reduce tendency to relatively lower sugars 3-4 hours later  With using Fiasp bolus right before eating  Do not suspend the pump when the blood sugar is low normal  Will be consider changing her active insulin time on the next visit  Keep a record of blood sugars before and after various meals and how much insulin you have taken for the amount of carbs  Crestor dose will be 20 mg   There are no Patient Instructions on file for this visit.   Counseling time on subjects discussed above is over 50% of today's 25 minute visit   Sierra Schneider 02/06/2017, 4:48 PM

## 2017-02-07 ENCOUNTER — Ambulatory Visit: Payer: Self-pay | Admitting: Endocrinology

## 2017-02-18 ENCOUNTER — Other Ambulatory Visit: Payer: Self-pay

## 2017-02-18 MED ORDER — ROSUVASTATIN CALCIUM 20 MG PO TABS: 20.0000 mg | ORAL_TABLET | Freq: Every day | ORAL | 3 refills | Status: DC

## 2017-02-20 ENCOUNTER — Other Ambulatory Visit: Payer: Self-pay

## 2017-02-21 ENCOUNTER — Other Ambulatory Visit (INDEPENDENT_AMBULATORY_CARE_PROVIDER_SITE_OTHER): Payer: BLUE CROSS/BLUE SHIELD

## 2017-02-21 DIAGNOSIS — R5383 Other fatigue: Secondary | ICD-10-CM | POA: Diagnosis not present

## 2017-02-21 DIAGNOSIS — E782 Mixed hyperlipidemia: Secondary | ICD-10-CM | POA: Diagnosis not present

## 2017-02-21 DIAGNOSIS — E1065 Type 1 diabetes mellitus with hyperglycemia: Secondary | ICD-10-CM | POA: Diagnosis not present

## 2017-02-21 LAB — LIPID PANEL
CHOL/HDL RATIO: 3
Cholesterol: 140 mg/dL (ref 0–200)
HDL: 49.7 mg/dL (ref >39.00)
LDL Cholesterol: 79 mg/dL (ref 0–99)
NONHDL: 90.58
TRIGLYCERIDES: 59 mg/dL (ref 0.0–149.0)
VLDL: 11.8 mg/dL (ref 0.0–40.0)

## 2017-02-21 LAB — COMPREHENSIVE METABOLIC PANEL
ALT: 14 U/L (ref 0–35)
AST: 15 U/L (ref 0–37)
Albumin: 4.5 g/dL (ref 3.5–5.2)
Alkaline Phosphatase: 76 U/L (ref 39–117)
BUN: 12 mg/dL (ref 6–23)
CHLORIDE: 102 meq/L (ref 96–112)
CO2: 30 meq/L (ref 19–32)
Calcium: 9 mg/dL (ref 8.4–10.5)
Creatinine, Ser: 0.7 mg/dL (ref 0.40–1.20)
GFR: 102.47 mL/min (ref >60.00)
GLUCOSE: 134 mg/dL — AB (ref 70–99)
POTASSIUM: 3.6 meq/L (ref 3.5–5.1)
Sodium: 138 mEq/L (ref 135–145)
Total Bilirubin: 0.4 mg/dL (ref 0.2–1.2)
Total Protein: 7 g/dL (ref 6.0–8.3)

## 2017-02-21 LAB — MICROALBUMIN / CREATININE URINE RATIO
CREATININE, U: 104.9 mg/dL
MICROALB/CREAT RATIO: 0.7 mg/g (ref 0.0–30.0)

## 2017-02-21 LAB — TSH: TSH: 1.84 u[IU]/mL (ref 0.35–4.50)

## 2017-02-21 LAB — HEMOGLOBIN A1C: Hgb A1c MFr Bld: 7.8 % — ABNORMAL HIGH (ref 4.6–6.5)

## 2017-03-14 ENCOUNTER — Ambulatory Visit: Payer: 59 | Admitting: Endocrinology

## 2017-03-14 ENCOUNTER — Encounter: Payer: Self-pay | Admitting: Endocrinology

## 2017-03-14 VITALS — BP 110/68 | HR 97 | Temp 98.8°F | Wt 152.5 lb

## 2017-03-14 DIAGNOSIS — E1065 Type 1 diabetes mellitus with hyperglycemia: Secondary | ICD-10-CM | POA: Diagnosis not present

## 2017-03-14 DIAGNOSIS — E7800 Pure hypercholesterolemia, unspecified: Secondary | ICD-10-CM | POA: Diagnosis not present

## 2017-03-14 NOTE — Patient Instructions (Addendum)
Active insulin 3 hrs  [email or web address]

## 2017-03-14 NOTE — Progress Notes (Signed)
Patient ID: Sierra JewettMelinda D Schneider, female   DOB: 06/17/1985, 32 y.o.   MRN: [id number]   Reason for Appointment: Diabetes followup:   History of Present Illness   Diagnosis: Type 1 DIABETES MELITUS     DIABETES history: She has had diabetes since about 2011 and has been on insulin pump for several years She had fairly good control in the first 2 years of her disease but blood sugar has been more difficult subsequently  CURRENT insulin pump:  Medtronic 670 G  SETTINGS are:    Basal rate on current download = Midnight = 1.2, 7 AM = 1.4, 10 AM = 0.6, 12 noon = 1.3, 6 PM = 1.05 and 7 PM = 0.9  Total basal insulin = 26 units Carb Ratio: 1:10. Correction factor 1: 30, target 100  Her A1c is higher at 8.1%    Current management, blood sugar patterns and problems identified:  Pump data:  She has been the AUTO mode for 58 % the time in the last 2 weeks  CGM use % 64   Average and SD 159+/-47   Time in range 69   % Time Above 180 25   % Time above 250 5   % Time Below target 1    Mean values apply above for all meters except median for One Touch  PRE-MEAL Fasting Lunch Dinner Bedtime Overall  Glucose range: 85-177       Mean/median:  116  119      POST-MEAL PC Breakfast PC Lunch PC Dinner  Glucose range:     Mean/median:  155  169      BLOOD SUGAR PATTERNS, management and problems:  OVERNIGHT blood sugars have been relatively stable although mildly increased around 150  Highest blood sugar was once late at night after she forgot to recurrent her pump following her shower  POSTPRANDIAL readings are somewhat variable but not consistently high or low  Has adequate couple of readings that are significantly lower postprandially from overestimating her carbohydrate or eating a low glycemic index meal such as this morning when she had only chocolate bar and entered 55 g of carbohydrate  She probably is doing fairly well with taking Fiasp as she is generally bolusing  right before eating; blood sugars are not as high after lunch as before  She has had some problems with her sensor and occasional calibration issues and has not been able to use it consistently in the last 2 weeks  She is still not motivated to exercise and has not lost any weight   Blood sugar readings about 3 hours after meals are relatively higher compared to 1-2 hours    Wt Readings from Last 3 Encounters:  03/14/17 152 lb 8 oz (69.2 kg)  02/01/17 151 lb 6.4 oz (68.7 kg)  11/06/16 151 lb 9.6 oz (68.8 kg)     LABS:  Lab Results  Component Value Date   HGBA1C 7.8 (H) 02/21/2017   HGBA1C 8.1 (H) 10/31/2016   HGBA1C 7.7 (H) 08/03/2016   Lab Results  Component Value Date   MICROALBUR <0.7 02/21/2017   LDLCALC 79 02/21/2017   CREATININE 0.70 02/21/2017    OTHER active problems discussed today: See review of systems    Allergies as of 03/14/2017   No Known Allergies     Medication List        Accurate as of 03/14/17 11:12 AM. Always use your most recent med list.  buPROPion 300 MG 24 hr tablet Commonly known as:  WELLBUTRIN XL Take 1 tablet (300 mg total) by mouth daily.   HYDROcodone-homatropine 5-1.5 MG/5ML syrup Commonly known as:  HYCODAN Take 5 mLs by mouth every 8 (eight) hours as needed for cough.   hydrOXYzine 50 MG capsule Commonly known as:  VISTARIL Take 1 capsule (50 mg total) by mouth at bedtime.   insulin aspart 100 UNIT/ML injection Commonly known as:  FIASP Use 80 units per insulin pump per day.   lamoTRIgine 200 MG tablet Commonly known as:  LAMICTAL Take 1 tablet (200 mg total) by mouth daily.   oxymetazoline 0.05 % nasal spray Commonly known as:  AFRIN NASAL SPRAY Place 1 spray into both nostrils 2 (two) times daily. DO NOT USE FOR MORE THAN THREE DAYS.   rosuvastatin 20 MG tablet Commonly known as:  CRESTOR Take 1 tablet (20 mg total) by mouth daily.       Allergies: No Known Allergies  Past Medical History:    Diagnosis Date  . Depression   . Diabetes mellitus 08/2009   late onset type 1  . Diabetes mellitus type I [facility])     Past Surgical History:  Procedure Laterality Date  . CESAREAN SECTION    . ESOPHAGOGASTRODUODENOSCOPY Left 10/14/2012   Procedure: ESOPHAGOGASTRODUODENOSCOPY (EGD);  Surgeon: Vertell Novak., MD;  Location: [facility] ENDOSCOPY;  Service: Endoscopy;  Laterality: Left;  . WISDOM TOOTH EXTRACTION      Family History  Problem Relation Age of Onset  . Depression Mother   . Hypertension Father   . Hyperlipidemia Father   . Kidney disease Paternal Grandmother   . Diabetes Maternal Grandfather   . ADD / ADHD Brother   . Depression Brother   . Drug abuse Brother   . Depression Maternal Aunt   . Depression Paternal Aunt   . Psychosis Cousin   . Heart disease Neg Hx     Social History:  reports that she quit smoking about 3 years ago. She smoked 0.00 packs per day. she has never used smokeless tobacco. She reports that she drinks alcohol. She reports that she does not use drugs.  REVIEW of systems:   She has had  HYPERCHOLESTEROLEMIA,  Because of her family history of hypercholesterolemia and her diabetes she was previously on pravastatin, this was switched to Crestor 10 mg with baseline of LDL of 146  LDL well controlled as follows:  Lab Results  Component Value Date   CHOL 140 02/21/2017   CHOL 128 04/23/2016   CHOL 149 08/25/2015   Lab Results  Component Value Date   HDL 49.70 02/21/2017   HDL 46.10 04/23/2016   HDL 53.20 08/25/2015   Lab Results  Component Value Date   LDLCALC 79 02/21/2017   LDLCALC 51 04/23/2016   LDLCALC 81 08/25/2015   Lab Results  Component Value Date   TRIG 59.0 02/21/2017   TRIG 153.0 (H) 04/23/2016   TRIG 71.0 08/25/2015   Lab Results  Component Value Date   CHOLHDL 3 02/21/2017   CHOLHDL 3 04/23/2016   CHOLHDL 3 08/25/2015   Lab Results  Component Value Date   LDLDIRECT 129.4 12/29/2012         EXAM:  BP  110/68 (BP Location: Right Arm, Patient Position: Sitting, Cuff Size: Normal)   Pulse 97   Temp 98.8 F (37.1 C) (Oral)   Wt 152 lb 8 oz (69.2 kg)   SpO2 98%   BMI 26.18 kg/m  ASSESSMENT:    See history of present illness for detailed discussion of  current management, blood sugar patterns and problems identified   A1c has been around 8% and now 7.8 She does have variability in her blood sugars after meals and also difficulty with getting her sensor to work consistently in the last couple of weeks which has affected her sugars She does not have any consistent patterns currently of hyperglycemia Currently still having to learn how to adjust her boluses for various types of meals and variable food intake including occasional sweets and eating out Also discussed that she probably can have better control if she is exercising which she is not motivated to do currently  Day-to-day pump management, diet, use of the CGM and boluses were discussed in detail again  LIPIDS: well-controlled  No microalbuminuria this time  Recommendations:   Reduce active insulin time on her pump down to 3 hours No change in carbohydrate coverage at this time With using Fiasp she needs to bolus less if she is eating a low glycemic index meal  She can try to do some exercise such as walking or dancing in the evenings She will call if she has any difficulties with pump management Otherwise recommended that she get a new transmitter for sensor, the link was provided  Crestor dose will be continued at  20 mgFor her hypercholesterolemia   Patient Instructions  Active insulin 3 hrs  [email or web address]      Counseling time on subjects discussed in assessment and plan sections is over 50% of today's 25 minute visit   Reather Littler 03/14/2017, 11:12 AM

## 2017-03-21 ENCOUNTER — Other Ambulatory Visit: Payer: Self-pay | Admitting: Endocrinology

## 2017-03-21 ENCOUNTER — Encounter: Payer: Self-pay | Admitting: Family Medicine

## 2017-03-21 ENCOUNTER — Ambulatory Visit (INDEPENDENT_AMBULATORY_CARE_PROVIDER_SITE_OTHER): Payer: 59 | Admitting: Family Medicine

## 2017-03-21 ENCOUNTER — Other Ambulatory Visit: Payer: Self-pay

## 2017-03-21 DIAGNOSIS — B9789 Other viral agents as the cause of diseases classified elsewhere: Secondary | ICD-10-CM

## 2017-03-21 DIAGNOSIS — J069 Acute upper respiratory infection, unspecified: Secondary | ICD-10-CM

## 2017-03-21 MED ORDER — DM-GUAIFENESIN ER 30-600 MG PO TB12
1.0000 | ORAL_TABLET | Freq: Two times a day (BID) | ORAL | 0 refills | Status: DC
Start: 2017-03-21 — End: 2017-05-28

## 2017-03-21 NOTE — Patient Instructions (Signed)
You were seen in clinic today for cough and congestion which is most likely due to the flu.  As we discussed, because you are out of the 48 hour window, Tamiflu would not be of any benefit.  I would like for you to continue drinking plenty of warm fluids while you recover as it is important to stay well hydrated.  Additionally, you can take Mucinex to help clear your congestion and I have sent this in to your pharmacy. I hope you start to feel better soon!  Please call clinic if you have any questions.    Be well, Freddrick MarchYashika Kamoria Lucien, MD

## 2017-03-21 NOTE — Progress Notes (Signed)
   Subjective:   Patient ID: Sierra Schneider    DOB: June 10, 1985, 32 y.o. female   MRN: [id number]  CC: congestion  HPI: Sierra Schneider is a 32 y.o. female who presents to clinic today for the following problem.  Congestion Symptoms began 4 days ago and started with an itchy throat.  The following morning she began to have a headache and cough.  Cough is productive of green thick sputum.  No blood noted.  She has had higher temps of T-max 99.29F however has not been febrile.  She endorses body aches which are not severe in nature.  She denies chills, nausea, vomiting, diarrhea.  She is able to tolerate p.o. but has not been very hungry lately.  She has been drinking plenty of fluids including soups, water and unsweetened tea.  Her husband had similar symptoms last week.  She has been taking DayQuil NyQuil combination and Tylenol for temperature.    ROS: Denies chills, nausea, vomiting.  Denies shortness of breath, chest pain, palpitations.  Social: Patient is a former smoker Medications reviewed.  Objective:   BP 106/72   Pulse 79   Temp 98.2 F (36.8 C) (Oral)   Ht 5\' 4"  (1.626 m)   Wt 151 lb 9.6 oz (68.8 kg)   SpO2 97%   BMI 26.02 kg/m  Vitals and nursing note reviewed.  General: [age] year old female, no acute distress HEENT: NCAT, EOMI, PERRLA, rhinorrhea present, MMM, oropharynx is clear without tonsillar erythema or exudate Neck: supple, full range of motion, no stiffness CV: RRR no MRG Lungs: CTA B, nonlabored breathing Abdomen: soft, NT ND, positive bowel sounds Skin: warm, dry, no rash Extremities: warm and well perfused  Assessment & Plan:   Viral URI with cough Signs and symptoms most consistent with viral URI vs influenza.  No red flags on exam.  Lung exam clear.  Tamiflu is of no benefit given outside 48-hour timeframe. - Flu shot administered today -Advised frequent handwashing to avoid spread - Provided Rx Mucinex -Advised plenty of warm fluids, honey for sore  throat -Return precautions discussed  Meds ordered this encounter  Medications  . dextromethorphan-guaiFENesin (MUCINEX DM) 30-600 MG 12hr tablet    Sig: Take 1 tablet by mouth 2 (two) times daily.    Dispense:  20 tablet    Refill:  0   Follow-up: If symptoms worsen or do not improve  Freddrick MarchYashika Kylo Gavin, MD [facility], PGY-2 03/26/2017 2:43 PM

## 2017-03-26 NOTE — Assessment & Plan Note (Signed)
Signs and symptoms most consistent with viral URI vs influenza.  No red flags on exam.  Lung exam clear.  Tamiflu is of no benefit given outside 48-hour timeframe. - Flu shot administered today -Advised frequent handwashing to avoid spread - Provided Rx Mucinex -Advised plenty of warm fluids, honey for sore throat -Return precautions discussed

## 2017-04-02 ENCOUNTER — Other Ambulatory Visit: Payer: Self-pay

## 2017-04-02 ENCOUNTER — Telehealth: Payer: Self-pay | Admitting: Endocrinology

## 2017-04-02 MED ORDER — INSULIN ASPART 100 UNIT/ML ~~LOC~~ SOLN: SUBCUTANEOUS | 2 refills | Status: DC

## 2017-04-02 NOTE — Telephone Encounter (Signed)
Patient needs RX for the newest insulin prescribed by Dr Lucianne MussKumar (does not remember name) sent to [facility] on [address]

## 2017-04-02 NOTE — Telephone Encounter (Signed)
Called patient and let her know that I have sent over another [facility] and that I see that it was sent on 03/22/2017 but I have sent again and if she has any trouble to call us back tomorrow so we can get her insulin for her insulin pump.

## 2017-04-03 ENCOUNTER — Other Ambulatory Visit: Payer: Self-pay

## 2017-04-03 MED ORDER — INSULIN ASPART 100 UNIT/ML ~~LOC~~ SOLN: SUBCUTANEOUS | 2 refills | Status: DC

## 2017-04-03 NOTE — Telephone Encounter (Signed)
[facility] and spoke to Justice and she has approved her Fiasp insulin until 04/03/2018 and the PA approval number is [id number].  I called patient and let her know that this has been approved and I have sent over a [facility] the [facility] on [address] and put the PA approval number in note to pharmacy.

## 2017-04-03 NOTE — Telephone Encounter (Signed)
insulin aspart (FIASP) 100 UNIT/ML injection  Pharmacy states they need a PA for script.

## 2017-04-19 ENCOUNTER — Other Ambulatory Visit (HOSPITAL_COMMUNITY): Payer: Self-pay | Admitting: Psychiatry

## 2017-04-19 DIAGNOSIS — F331 Major depressive disorder, recurrent, moderate: Secondary | ICD-10-CM

## 2017-05-05 ENCOUNTER — Other Ambulatory Visit (HOSPITAL_COMMUNITY): Payer: Self-pay | Admitting: Psychiatry

## 2017-05-05 DIAGNOSIS — F331 Major depressive disorder, recurrent, moderate: Secondary | ICD-10-CM

## 2017-05-08 ENCOUNTER — Other Ambulatory Visit (HOSPITAL_COMMUNITY): Payer: Self-pay

## 2017-05-08 DIAGNOSIS — F331 Major depressive disorder, recurrent, moderate: Secondary | ICD-10-CM

## 2017-05-08 MED ORDER — LAMOTRIGINE 200 MG PO TABS: 200.0000 mg | ORAL_TABLET | Freq: Every day | ORAL | 0 refills | Status: DC

## 2017-05-28 ENCOUNTER — Ambulatory Visit (INDEPENDENT_AMBULATORY_CARE_PROVIDER_SITE_OTHER): Payer: 59 | Admitting: Psychiatry

## 2017-05-28 ENCOUNTER — Encounter (HOSPITAL_COMMUNITY): Payer: Self-pay | Admitting: Psychiatry

## 2017-05-28 DIAGNOSIS — F331 Major depressive disorder, recurrent, moderate: Secondary | ICD-10-CM

## 2017-05-28 DIAGNOSIS — F411 Generalized anxiety disorder: Secondary | ICD-10-CM

## 2017-05-28 DIAGNOSIS — Z975 Presence of (intrauterine) contraceptive device: Secondary | ICD-10-CM

## 2017-05-28 DIAGNOSIS — Z813 Family history of other psychoactive substance abuse and dependence: Secondary | ICD-10-CM | POA: Diagnosis not present

## 2017-05-28 DIAGNOSIS — R450 Nervousness: Secondary | ICD-10-CM | POA: Diagnosis not present

## 2017-05-28 DIAGNOSIS — F410 Panic disorder [episodic paroxysmal anxiety] without agoraphobia: Secondary | ICD-10-CM

## 2017-05-28 DIAGNOSIS — Z87891 Personal history of nicotine dependence: Secondary | ICD-10-CM

## 2017-05-28 DIAGNOSIS — Z818 Family history of other mental and behavioral disorders: Secondary | ICD-10-CM

## 2017-05-28 MED ORDER — BUPROPION HCL ER (XL) 300 MG PO TB24: 300.0000 mg | ORAL_TABLET | Freq: Every day | ORAL | 0 refills | Status: DC

## 2017-05-28 MED ORDER — LAMOTRIGINE 100 MG PO TABS: ORAL_TABLET | ORAL | 2 refills | Status: DC

## 2017-05-28 MED ORDER — HYDROXYZINE PAMOATE 50 MG PO CAPS: 50.0000 mg | ORAL_CAPSULE | Freq: Every day | ORAL | 0 refills | Status: DC

## 2017-05-28 NOTE — Progress Notes (Signed)
BH MD/PA/NP OP Progress Note  05/28/2017 3:07 PM Sierra Schneider  MRN:  [id number]  Chief Complaint: I apologized missing appointment.  I started to have again crying spells.  HPI: Sierra Schneider came for her follow-up appointment.  She missed her last appointment but compliant with medication.  She endorsed she started to have again crying spells and getting emotional.  Her 33 year old son who diagnosed with diabetes is now seeing endocrinologist regularly.  His endocrinologist told that he may not need insulin now because his body is producing insulin but eventually he will need in the future.  Patient is still have a lot of anxiety and concerns about his health.  She admitted some time tearful and feels medicine is not strong enough.  She is sleeping fair.  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  Patient also very concerned about her 89-year-old daughter who so far had not been diagnosed but getting and will check up to rule out diabetes.  Patient denies any agitation, anger, mania or psychosis.  She takes Vistaril once a while to help her anxiety and panic attacks.  She like Wellbutrin and Lamictal but believe Lamictal dose is not strong enough.  She has no rash, itching or tremors.  Patient denies any mania or any suicidal thoughts.  Her energy level is okay.  Patient denies drinking alcohol or using any illegal substances.  Patient is not interested in counseling.  She had blood work in December and her hemoglobin A1c is 7.8.  Visit Diagnosis:    ICD-10-CM   1. Moderate episode of recurrent major depressive disorder (HCC) F33.1 hydrOXYzine (VISTARIL) 50 MG capsule    buPROPion (WELLBUTRIN XL) 300 MG 24 hr tablet    lamoTRIgine (LAMICTAL) 100 MG tablet    Past Psychiatric History: Reviewed. Patient had history of depression when her daughter was born. She reported taking Prozac, BuSpar, Effexor, Zoloft and Cymbalta. Patient denies any history of suicidal attempt or any psychiatric inpatient  treatment. She denies any history of mania psychosis or any hallucination.  Past Medical History:  Past Medical History:  Diagnosis Date  . Depression   . Diabetes mellitus 08/2009   late onset type 1  . Diabetes mellitus type I [facility])     Past Surgical History:  Procedure Laterality Date  . CESAREAN SECTION    . ESOPHAGOGASTRODUODENOSCOPY Left 10/14/2012   Procedure: ESOPHAGOGASTRODUODENOSCOPY (EGD);  Surgeon: Vertell Novak., MD;  Location: [facility] ENDOSCOPY;  Service: Endoscopy;  Laterality: Left;  . WISDOM TOOTH EXTRACTION      Family Psychiatric History: Reviewed  Family History:  Family History  Problem Relation Age of Onset  . Depression Mother   . Hypertension Father   . Hyperlipidemia Father   . Kidney disease Paternal Grandmother   . Diabetes Maternal Grandfather   . ADD / ADHD Brother   . Depression Brother   . Drug abuse Brother   . Depression Maternal Aunt   . Depression Paternal Aunt   . Psychosis Cousin   . Heart disease Neg Hx     Social History:  Social History   Socioeconomic History  . Marital status: Single    Spouse name: Not on file  . Number of children: Not on file  . Years of education: Not on file  . Highest education level: Not on file  Occupational History  . Not on file  Social Needs  . Financial resource strain: Not on file  . Food insecurity:    Worry: Not on  file    Inability: Not on file  . Transportation needs:    Medical: Not on file    Non-medical: Not on file  Tobacco Use  . Smoking status: Former Smoker    Packs/day: 0.00    Last attempt to quit: 01/26/2014    Years since quitting: 3.3  . Smokeless tobacco: Never Used  Substance and Sexual Activity  . Alcohol use: Yes    Comment: 1 drink every 3 months  . Drug use: No  . Sexual activity: Yes    Birth control/protection: IUD    Comment: mirena  Lifestyle  . Physical activity:    Days per week: Not on file    Minutes per session: Not on file  . Stress: Not on  file  Relationships  . Social connections:    Talks on phone: Not on file    Gets together: Not on file    Attends religious service: Not on file    Active member of club or organization: Not on file    Attends meetings of clubs or organizations: Not on file    Relationship status: Not on file  Other Topics Concern  . Not on file  Social History Narrative   Patient goes to school full-time at [employer]-studying business. Patient works part-time at [employer]. Is engaged. Having relationship difficulties. Has 2 children-ages 2 and 8.    Allergies: No Known Allergies  Metabolic Disorder Labs: Lab Results  Component Value Date   HGBA1C 7.8 (H) 02/21/2017   MPG 189 (H) 03/13/2014   MPG 189 (H) 04/10/2013   No results found for: PROLACTIN Lab Results  Component Value Date   CHOL 140 02/21/2017   TRIG 59.0 02/21/2017   HDL 49.70 02/21/2017   CHOLHDL 3 02/21/2017   VLDL 11.8 02/21/2017   LDLCALC 79 02/21/2017   LDLCALC 51 04/23/2016   Lab Results  Component Value Date   TSH 1.84 02/21/2017   TSH 1.26 05/07/2016    Therapeutic Level Labs: No results found for: LITHIUM No results found for: VALPROATE No components found for:  CBMZ  Current Medications: Current Outpatient Medications  Medication Sig Dispense Refill  . buPROPion (WELLBUTRIN XL) 300 MG 24 hr tablet Take 1 tablet (300 mg total) by mouth daily. 90 tablet 0  . dextromethorphan-guaiFENesin (MUCINEX DM) 30-600 MG 12hr tablet Take 1 tablet by mouth 2 (two) times daily. 20 tablet 0  . HYDROcodone-homatropine (HYCODAN) 5-1.5 MG/5ML syrup Take 5 mLs by mouth every 8 (eight) hours as needed for cough. 120 mL 0  . hydrOXYzine (VISTARIL) 50 MG capsule Take 1 capsule (50 mg total) by mouth at bedtime. 90 capsule 0  . insulin aspart (FIASP) 100 UNIT/ML injection USE 80 UNITS PER INSULIN PUMP PER DAY. 30 mL 2  . lamoTRIgine (LAMICTAL) 200 MG tablet Take 1 tablet (200 mg total) by mouth daily. 30 tablet 0  . oxymetazoline  (AFRIN NASAL SPRAY) 0.05 % nasal spray Place 1 spray into both nostrils 2 (two) times daily. DO NOT USE FOR MORE THAN THREE DAYS. 30 mL 0  . rosuvastatin (CRESTOR) 20 MG tablet Take 1 tablet (20 mg total) by mouth daily. 30 tablet 3   No current facility-administered medications for this visit.      Musculoskeletal: Strength & Muscle Tone: within normal limits Gait & Station: normal Patient leans: N/A  Psychiatric Specialty Exam: Review of Systems  Constitutional: Negative.   HENT: Negative.   Respiratory: Negative.   Musculoskeletal: Negative.   Skin: Negative.  Negative for itching and rash.  Psychiatric/Behavioral: The patient is nervous/anxious.     Blood pressure 114/73, pulse 90, height 5\' 4"  (1.626 m), weight 150 lb (68 kg), SpO2 99 %.Body mass index is 25.75 kg/m.  General Appearance: Casual  Eye Contact:  Good  Speech:  Clear and Coherent  Volume:  Normal  Mood:  Anxious at times emotional  Affect:  Appropriate  Thought Process:  Coherent  Orientation:  Full (Time, Place, and Person)  Thought Content: Rumination   Suicidal Thoughts:  No  Homicidal Thoughts:  No  Memory:  Immediate;   Good Recent;   Good Remote;   Good  Judgement:  Good  Insight:  Good  Psychomotor Activity:  Normal  Concentration:  Concentration: Good and Attention Span: Fair  Recall:  Fair  Fund of Knowledge: Good  Language: Good  Akathisia:  No  Handed:  Right  AIMS (if indicated): not done  Assets:  Communication Skills Desire for Improvement Housing Physical Health Resilience Social Support  ADL's:  Intact  Cognition: WNL  Sleep:  Good   Screenings: GAD-7     Counselor from 07/12/2016 in BEHAVIORAL HEALTH OUTPATIENT THERAPY [place]  Total GAD-7 Score  15    PHQ2-9     Office Visit from 03/21/2017 in [facility] Counselor from 07/12/2016 in BEHAVIORAL HEALTH OUTPATIENT THERAPY [place] Office Visit from 06/26/2016 in [facility]  Office Visit from 05/07/2016 in [facility] Office Visit from 04/05/2016 in [facility]  PHQ-2 Total Score  0  3  2  2   0  PHQ-9 Total Score  -  13  -  -  -       Assessment and Plan: Major depressive disorder, recurrent.  Generalized anxiety disorder NOS.  Panic attacks.  Reinforced follow-ups and medication compliance.  Reassurance given.  Patient is still have residual mood lability and depressive symptoms.  Recommended to try Lamictal 250 mg for 2 weeks and then 300 mg daily.  Patient has no rash, itching, tremors or shakes.  Continue Wellbutrin XL 300 mg daily and Vistaril 50 mg as needed for anxiety and panic attacks.  One more time I offered counseling but patient declined.  Discussed medication side effects and benefits.  Recommended to call us back if she has any question or any concern.  Follow-up in 2-3 months.   Cleotis Nipper, MD 05/28/2017, 3:07 PM

## 2017-06-05 ENCOUNTER — Other Ambulatory Visit (INDEPENDENT_AMBULATORY_CARE_PROVIDER_SITE_OTHER): Payer: Self-pay

## 2017-06-05 DIAGNOSIS — E1065 Type 1 diabetes mellitus with hyperglycemia: Secondary | ICD-10-CM

## 2017-06-05 LAB — GLUCOSE, RANDOM: GLUCOSE: 175 mg/dL — AB (ref 70–99)

## 2017-06-05 LAB — HEMOGLOBIN A1C: HEMOGLOBIN A1C: 7.1 % — AB (ref 4.6–6.5)

## 2017-06-07 ENCOUNTER — Other Ambulatory Visit: Payer: Self-pay

## 2017-06-08 ENCOUNTER — Other Ambulatory Visit (HOSPITAL_COMMUNITY): Payer: Self-pay | Admitting: Psychiatry

## 2017-06-08 DIAGNOSIS — F331 Major depressive disorder, recurrent, moderate: Secondary | ICD-10-CM

## 2017-06-11 ENCOUNTER — Ambulatory Visit (INDEPENDENT_AMBULATORY_CARE_PROVIDER_SITE_OTHER): Payer: Self-pay | Admitting: Endocrinology

## 2017-06-11 ENCOUNTER — Encounter: Payer: Self-pay | Admitting: Endocrinology

## 2017-06-11 ENCOUNTER — Ambulatory Visit: Payer: Self-pay | Admitting: Endocrinology

## 2017-06-11 VITALS — BP 110/66 | HR 95 | Ht 64.0 in | Wt 148.6 lb

## 2017-06-11 DIAGNOSIS — E7800 Pure hypercholesterolemia, unspecified: Secondary | ICD-10-CM

## 2017-06-11 DIAGNOSIS — E1065 Type 1 diabetes mellitus with hyperglycemia: Secondary | ICD-10-CM

## 2017-06-11 NOTE — Progress Notes (Signed)
Patient ID: Sierra Schneider, female   DOB: Feb 03, 1986, 32 y.o.   MRN: [id number]   Reason for Appointment: Diabetes followup:   History of Present Illness   Diagnosis: Type 1 DIABETES MELITUS     DIABETES history: She has had diabetes since about 2011 and has been on insulin pump for several years She had fairly good control in the first 2 years of her disease but blood sugar has been more difficult subsequently  CURRENT insulin pump:  Medtronic 670 G  SETTINGS are:    Basal rate on current download = Midnight = 1.2, 7 AM = 1.4, 10 AM = 0.6, 12 noon = 1.3, 6 PM = 1.05 and 7 PM = 0.9  Total basal insulin = 26 units Carb Ratio: 1:10. Correction factor 1: 30, target 100 Active insulin time 3 hours  Her A1c has been variable but compared to the last 2 visits it is now better at 7.1   Current management, blood sugar patterns and problems identified:  Pump data:  She has been the AUTO mode for 86 % the time in the last 2 weeks  Reasons for exiting auto mode are either disabled by user, high blood sugars sensor algorithm unread  CGM use % of time  83  Average and SD  151+/-44  Time in range  78     %  % Time Above 180  17  % Time above 250  4  % Time Below target  1   Glucose management indicator: 6.9 %  Mean values apply above for all meters except median for One Touch  PRE-MEAL Fasting Lunch Dinner Bedtime Overall  Glucose range:  118-223  87-151  90-242    Mean/median:   33  146     POST-MEAL PC Breakfast PC Lunch PC Dinner  Glucose range:     Mean/median:   190  179     BLOOD SUGAR PATTERNS, management and problems:   OVERNIGHT blood sugars have been fairly consistently good at least starting after about 1-2 AM  She has had less problems with hyperglycemia after breakfast compared to her last visit although she is not always eating a meal in the morning  HIGHEST blood sugars are after lunch although these are not consistent but she is tending to  have mostly fast food lunch [facility] when she is working, she is trying to cut back on [place] but overall may still have a relatively high fat meal; her blood sugars appear to be relatively higher about 2-3 hours after the meal rather than 1 hour after the meal  This is despite her active insulin time being reduced to 3 hours on the last visit  She does try to do a correction bolus in the afternoon when the blood sugars are high  She is bolusing right before eating usually  Overall postprandial readings after evening meal are less variable and usually not high  Also minimal hypoglycemia with only one relatively low sugar or evening bolus  She is trying to do a little exercise in the evenings at least 4 days a week  Has lost some weight    Wt Readings from Last 3 Encounters:  06/11/17 148 lb 9.6 oz (67.4 kg)  03/21/17 151 lb 9.6 oz (68.8 kg)  03/14/17 152 lb 8 oz (69.2 kg)     LABS:  Lab Results  Component Value Date   HGBA1C 7.1 (H) 06/05/2017   HGBA1C 7.8 (H) 02/21/2017  HGBA1C 8.1 (H) 10/31/2016   Lab Results  Component Value Date   MICROALBUR <0.7 02/21/2017   LDLCALC 79 02/21/2017   CREATININE 0.70 02/21/2017    OTHER active problems discussed today: See review of systems    Allergies as of 06/11/2017   No Known Allergies     Medication List        Accurate as of 06/11/17  9:32 AM. Always use your most recent med list.          buPROPion 300 MG 24 hr tablet Commonly known as:  WELLBUTRIN XL Take 1 tablet (300 mg total) by mouth daily.   hydrOXYzine 50 MG capsule Commonly known as:  VISTARIL Take 1 capsule (50 mg total) by mouth at bedtime.   insulin aspart 100 UNIT/ML injection Commonly known as:  FIASP USE 80 UNITS PER INSULIN PUMP PER DAY.   lamoTRIgine 100 MG tablet Commonly known as:  LAMICTAL Take 2 and 1/2 tab daily for 2 weeks and than 3 tab daily   oxymetazoline 0.05 % nasal spray Commonly known as:  AFRIN NASAL  SPRAY Place 1 spray into both nostrils 2 (two) times daily. DO NOT USE FOR MORE THAN THREE DAYS.   rosuvastatin 20 MG tablet Commonly known as:  CRESTOR Take 1 tablet (20 mg total) by mouth daily.       Allergies: No Known Allergies  Past Medical History:  Diagnosis Date  . Depression   . Diabetes mellitus 08/2009   late onset type 1  . Diabetes mellitus type I [facility])     Past Surgical History:  Procedure Laterality Date  . CESAREAN SECTION    . ESOPHAGOGASTRODUODENOSCOPY Left 10/14/2012   Procedure: ESOPHAGOGASTRODUODENOSCOPY (EGD);  Surgeon: Vertell Novak., MD;  Location: [facility] ENDOSCOPY;  Service: Endoscopy;  Laterality: Left;  . WISDOM TOOTH EXTRACTION      Family History  Problem Relation Age of Onset  . Depression Mother   . Hypertension Father   . Hyperlipidemia Father   . Kidney disease Paternal Grandmother   . Diabetes Maternal Grandfather   . ADD / ADHD Brother   . Depression Brother   . Drug abuse Brother   . Depression Maternal Aunt   . Depression Paternal Aunt   . Psychosis Cousin   . Heart disease Neg Hx     Social History:  reports that she quit smoking about 3 years ago. She smoked 0.00 packs per day. She has never used smokeless tobacco. She reports that she drinks alcohol. She reports that she does not use drugs.  REVIEW of systems:   She has had  HYPERCHOLESTEROLEMIA,  Because of her family history of hypercholesterolemia and her diabetes she was previously on pravastatin, this was switched to Crestor 10 mg with baseline of LDL of 146  LDL well controlled as follows:  Lab Results  Component Value Date   CHOL 140 02/21/2017   CHOL 128 04/23/2016   CHOL 149 08/25/2015   Lab Results  Component Value Date   HDL 49.70 02/21/2017   HDL 46.10 04/23/2016   HDL 53.20 08/25/2015   Lab Results  Component Value Date   LDLCALC 79 02/21/2017   LDLCALC 51 04/23/2016   LDLCALC 81 08/25/2015   Lab Results  Component Value Date   TRIG 59.0  02/21/2017   TRIG 153.0 (H) 04/23/2016   TRIG 71.0 08/25/2015   Lab Results  Component Value Date   CHOLHDL 3 02/21/2017   CHOLHDL 3 04/23/2016   CHOLHDL 3  08/25/2015   Lab Results  Component Value Date   LDLDIRECT 129.4 12/29/2012         EXAM:  BP 110/66 (BP Location: Left Arm, Patient Position: Sitting, Cuff Size: Normal)   Pulse 95   Ht 5\' 4"  (1.626 m)   Wt 148 lb 9.6 oz (67.4 kg)   SpO2 96%   BMI 25.51 kg/m    ASSESSMENT:    See history of present illness for detailed discussion of  current management, blood sugar patterns and problems identified   A1c has been around 8% and now significantly improved at 7.1   She has had over 70% of her readings within target and less variability compared to the last time Having less problems with her sensor lately  However does appear to have more consistent hyperglycemia after lunch related to a high fat meal Even though her carbohydrate coverage and bolus amount is appropriate at lunch because of her getting faster acting insulin she has a delayed rise in blood sugar which she is not controlled with her auto basal rate adjustment With a lower fat meal usually at suppertime her blood sugars are better and also may be benefiting from exercise in the evenings to  Day-to-day pump management, diet, mealtime coverage, boluses, weight management were discussed today  LIPIDS: well-controlled on the last visit and will need follow-up on the next visit  Recommendations:  Although she can do better with reducing her high fat meals at lunch for now she will need to try doing a split boluses while using Fiasp.  She can take at least 50-60% of the dose right before eating and then the rest about an hour later She can continue 3-hour active insulin time for now She is having more difficulties with the auto mode exit she will need to call us No change in carbohydrate coverage for now but may consider adjusting it her postprandial  readings are not inconsistent on the next visit Encouraged her to be regular with exercise   There are no Patient Instructions on file for this visit.  Counseling time on subjects discussed in assessment and plan sections is over 50% of today's 25 minute visit    Reather Littler 06/11/2017, 9:32 AM

## 2017-06-12 ENCOUNTER — Ambulatory Visit: Payer: Self-pay | Admitting: Endocrinology

## 2017-07-30 ENCOUNTER — Ambulatory Visit (HOSPITAL_COMMUNITY): Payer: Self-pay | Admitting: Psychiatry

## 2017-07-31 ENCOUNTER — Encounter (HOSPITAL_COMMUNITY): Payer: Self-pay | Admitting: Psychology

## 2017-07-31 NOTE — Progress Notes (Signed)
Sierra Schneider Telford is a 32 y.o. female patient who is discharged from counseling as last seen on 10/02/16.  Outpatient Therapist Discharge Summary  Sierra Schneider Mione    Jul 25, 1985   Admission Date: 07/13/16   Discharge Date:  07/31/17 Reason for Discharge:  Not interested in returning Diagnosis: MDD   Comments:  Pt had indicated at Med Eval that not interested in counseling for self at this time- focused on son.  Alfredo BattyLeanne M Akin Yi         Daton Szilagyi, LPC

## 2017-08-08 ENCOUNTER — Ambulatory Visit (HOSPITAL_COMMUNITY): Payer: 59 | Admitting: Psychiatry

## 2017-08-23 ENCOUNTER — Other Ambulatory Visit: Payer: Self-pay | Admitting: Endocrinology

## 2017-09-09 ENCOUNTER — Other Ambulatory Visit (INDEPENDENT_AMBULATORY_CARE_PROVIDER_SITE_OTHER): Payer: 59

## 2017-09-09 DIAGNOSIS — E1065 Type 1 diabetes mellitus with hyperglycemia: Secondary | ICD-10-CM

## 2017-09-09 DIAGNOSIS — E7800 Pure hypercholesterolemia, unspecified: Secondary | ICD-10-CM | POA: Diagnosis not present

## 2017-09-09 LAB — LIPID PANEL
CHOL/HDL RATIO: 2
Cholesterol: 125 mg/dL (ref 0–200)
HDL: 50.7 mg/dL (ref >39.00)
LDL Cholesterol: 63 mg/dL (ref 0–99)
NonHDL: 74.74
TRIGLYCERIDES: 61 mg/dL (ref 0.0–149.0)
VLDL: 12.2 mg/dL (ref 0.0–40.0)

## 2017-09-09 LAB — COMPREHENSIVE METABOLIC PANEL
ALT: 11 U/L (ref 0–35)
AST: 14 U/L (ref 0–37)
Albumin: 4.5 g/dL (ref 3.5–5.2)
Alkaline Phosphatase: 66 U/L (ref 39–117)
BUN: 9 mg/dL (ref 6–23)
CALCIUM: 9.1 mg/dL (ref 8.4–10.5)
CO2: 28 mEq/L (ref 19–32)
CREATININE: 0.78 mg/dL (ref 0.40–1.20)
Chloride: 106 mEq/L (ref 96–112)
GFR: 90.14 mL/min (ref >60.00)
Glucose, Bld: 66 mg/dL — ABNORMAL LOW (ref 70–99)
Potassium: 3.3 mEq/L — ABNORMAL LOW (ref 3.5–5.1)
Sodium: 141 mEq/L (ref 135–145)
Total Bilirubin: 0.4 mg/dL (ref 0.2–1.2)
Total Protein: 6.8 g/dL (ref 6.0–8.3)

## 2017-09-09 LAB — HEMOGLOBIN A1C: Hgb A1c MFr Bld: 7.2 % — ABNORMAL HIGH (ref 4.6–6.5)

## 2017-09-13 ENCOUNTER — Ambulatory Visit (INDEPENDENT_AMBULATORY_CARE_PROVIDER_SITE_OTHER): Payer: 59 | Admitting: Endocrinology

## 2017-09-13 ENCOUNTER — Encounter: Payer: Self-pay | Admitting: Endocrinology

## 2017-09-13 VITALS — BP 108/62 | HR 94 | Ht 64.0 in | Wt 142.6 lb

## 2017-09-13 DIAGNOSIS — E1065 Type 1 diabetes mellitus with hyperglycemia: Secondary | ICD-10-CM

## 2017-09-13 DIAGNOSIS — E7800 Pure hypercholesterolemia, unspecified: Secondary | ICD-10-CM | POA: Diagnosis not present

## 2017-09-13 NOTE — Progress Notes (Signed)
Patient ID: Sierra Schneider, female   DOB: [birth year], 32 y.o.   MRN: [id number]   Reason for Appointment: Diabetes followup:   History of Present Illness   Diagnosis: Type 1 DIABETES MELITUS     DIABETES history: She has had diabetes since about 2011 and has been on insulin pump for several years She had fairly good control in the first 2 years of her disease but blood sugar has been more difficult subsequently  CURRENT insulin pump:  Medtronic 670 G  SETTINGS are:    Basal rate on current download = Midnight = 1.2, 7 AM = 1.4, 10 AM = 0.6, 12 noon = 1.3, 6 PM = 1.05 and 7 PM = 0.9  Total basal insulin = 26 units Carb Ratio: 1:10. Correction factor 1: 30, target 100 Active insulin time 3 hours  Her A1c has been variable but compared to the last 2 visits it is now better at 7.1   Current management, blood sugar patterns and problems identified:  Pump data: She has been the AUTO mode for 74 % the time in the last 2 weeks Reasons for exiting auto mode are not using sensor, sensor algorithm unread and one episode of low calibration and high glucose   CGM use % of time  74  Average and SD  145+/-48  Time in range     76   %  % Time Above 180  20  % Time above 250   % Time Below target  4%    PRE-MEAL  AC breakfast Lunch Dinner Bedtime Overall  Glucose range:  66-319      Mean/median:   183  171   145   POST-MEAL PC Breakfast PC Lunch PC Dinner  Glucose range:     Mean/median:   152  138     BLOOD SUGAR PATTERNS, management and problems:   On her last visit her highest blood sugars were after lunch, averaging 190  This was partly related to her eating out and eating higher fat meals  She was told to try and bolus a second time about an hour after eating but she could not remember to do this and more recently has not had consistent meals at lunch also  Again however recently appears to have relatively early hyperglycemia after lunch  This is despite  using Fiasp, she thinks she is bolusing right at mealtimes  Otherwise she does not have any clear-cut episodes of hypoglycemia  OVERNIGHT blood sugars have been very stable and at target  POSTPRANDIAL readings however after supper tend to be relatively low with current bolus ratio 1: 12  Because of not being able to afford her sensor she is not using this consistently and blood sugars tend to be high in the manual mode  Blood sugar variability is present mostly in the afternoon until late evening, not as much in the morning when she is usually not eating breakfast  Not walking as much for exercise as before but her weight has improved likely to be from her overall decreased food intake  Also some of her high sugars recently were with traveling and being at the beach vacation    Wt Readings from Last 3 Encounters:  09/13/17 142 lb 9.6 oz (64.7 kg)  06/11/17 148 lb 9.6 oz (67.4 kg)  03/21/17 151 lb 9.6 oz (68.8 kg)     LABS:  Lab Results  Component Value Date   HGBA1C 7.2 (H) 09/09/2017  HGBA1C 7.1 (H) 06/05/2017   HGBA1C 7.8 (H) 02/21/2017   Lab Results  Component Value Date   MICROALBUR <0.7 02/21/2017   LDLCALC 63 09/09/2017   CREATININE 0.78 09/09/2017    OTHER active problems discussed today: See review of systems    Allergies as of 09/13/2017   No Known Allergies     Medication List        Accurate as of 09/13/17 11:59 PM. Always use your most recent med list.          buPROPion 300 MG 24 hr tablet Commonly known as:  WELLBUTRIN XL Take 1 tablet (300 mg total) by mouth daily.   hydrOXYzine 50 MG capsule Commonly known as:  VISTARIL Take 1 capsule (50 mg total) by mouth at bedtime.   insulin aspart 100 UNIT/ML injection Commonly known as:  FIASP USE 80 UNITS PER INSULIN PUMP PER DAY.   lamoTRIgine 100 MG tablet Commonly known as:  LAMICTAL Take 2 and 1/2 tab daily for 2 weeks and than 3 tab daily   oxymetazoline 0.05 % nasal spray Commonly  known as:  AFRIN NASAL SPRAY Place 1 spray into both nostrils 2 (two) times daily. DO NOT USE FOR MORE THAN THREE DAYS.   rosuvastatin 20 MG tablet Commonly known as:  CRESTOR TAKE 1 TABLET BY MOUTH EVERY DAY       Allergies: No Known Allergies  Past Medical History:  Diagnosis Date  . Depression   . Diabetes mellitus 08/2009   late onset type 1  . Diabetes mellitus type I [facility])     Past Surgical History:  Procedure Laterality Date  . CESAREAN SECTION    . ESOPHAGOGASTRODUODENOSCOPY Left 10/14/2012   Procedure: ESOPHAGOGASTRODUODENOSCOPY (EGD);  Surgeon: Vertell Novak., MD;  Location: [facility] ENDOSCOPY;  Service: Endoscopy;  Laterality: Left;  . WISDOM TOOTH EXTRACTION      Family History  Problem Relation Age of Onset  . Depression Mother   . Hypertension Father   . Hyperlipidemia Father   . Kidney disease Paternal Grandmother   . Diabetes Maternal Grandfather   . ADD / ADHD Brother   . Depression Brother   . Drug abuse Brother   . Depression Maternal Aunt   . Depression Paternal Aunt   . Psychosis Cousin   . Heart disease Neg Hx     Social History:  reports that she quit smoking about 3 years ago. She smoked 0.00 packs per day. She has never used smokeless tobacco. She reports that she drinks alcohol. She reports that she does not use drugs.  REVIEW of systems:   She has had  HYPERCHOLESTEROLEMIA,  Because of her family history of hypercholesterolemia and her diabetes she was previously on pravastatin, now on Crestor 20 mg with baseline of LDL of 146  LDL well controlled as follows:  Lab Results  Component Value Date   CHOL 125 09/09/2017   CHOL 140 02/21/2017   CHOL 128 04/23/2016   Lab Results  Component Value Date   HDL 50.70 09/09/2017   HDL 49.70 02/21/2017   HDL 46.10 04/23/2016   Lab Results  Component Value Date   LDLCALC 63 09/09/2017   LDLCALC 79 02/21/2017   LDLCALC 51 04/23/2016   Lab Results  Component Value Date   TRIG 61.0  09/09/2017   TRIG 59.0 02/21/2017   TRIG 153.0 (H) 04/23/2016   Lab Results  Component Value Date   CHOLHDL 2 09/09/2017   CHOLHDL 3 02/21/2017   CHOLHDL  3 04/23/2016   Lab Results  Component Value Date   LDLDIRECT 129.4 12/29/2012         EXAM:  BP 108/62 (BP Location: Left Arm, Patient Position: Sitting, Cuff Size: Normal)   Pulse 94   Ht 5\' 4"  (1.626 m)   Wt 142 lb 9.6 oz (64.7 kg)   SpO2 96%   BMI 24.48 kg/m    ASSESSMENT:    Type I diabetes on Medtronic 670 pump  See history of present illness for detailed discussion of  current management, blood sugar patterns and problems identified   A1c has been around 8% and now consistently improved at 7.2   She has had over 70% of her readings within target  Still has some difficulties with mealtime insulin control  This is partly related to variability in her diet, not accurately estimating how much she needs to bolus for meals and periodic higher fat intake Blood sugar is about 3 hours after eating are tending to be relatively higher but her active insulin time is already at 3 hours, this is variable Also she is not able to afford her sensor consistently and tends to have hyperglycemia especially late evening when she is in the manual mode  Day-to-day pump insulin management, diet, mealtime coverage, mealtime boluses, weight management were discussed today and counseling was over 50% of the total visit time today  LIPIDS: well-controlled with Crestor 20 mg and will need follow-up at least annually, LDL relatively better   Recommendations:  More consistent use of sensor She needs to try and estimate her carbohydrates at meals better with using reference guides We will change her carbohydrate ratio to 1: 9 at lunch and 1: 14 at dinnertime This will enable her to get more insulin at lunchtime and avoid tendency to low normal sugars after dinner More regular exercise No change in active insulin time is yet but will  reassess on the next visit   There are no Patient Instructions on file for this visit.  Counseling time on subjects discussed in assessment and plan sections is over 50% of today's 25 minute visit    Reather Littler 09/14/2017, 7:47 AM

## 2017-09-21 ENCOUNTER — Ambulatory Visit (HOSPITAL_COMMUNITY): Payer: 59 | Admitting: Psychiatry

## 2017-09-26 ENCOUNTER — Other Ambulatory Visit (HOSPITAL_COMMUNITY): Payer: Self-pay | Admitting: Psychiatry

## 2017-09-26 DIAGNOSIS — F331 Major depressive disorder, recurrent, moderate: Secondary | ICD-10-CM

## 2017-10-04 ENCOUNTER — Telehealth: Payer: Self-pay | Admitting: Endocrinology

## 2017-10-04 NOTE — Telephone Encounter (Signed)
LMTCB

## 2017-10-04 NOTE — Telephone Encounter (Signed)
Pt states the basal rate for her pump was changed in the last visit, it is not showing on the AVS anymore and it does not appear that there is any change in the office note. Please advise

## 2017-10-04 NOTE — Telephone Encounter (Signed)
Please let her know that the basal rate is not changed on her 670 pump since she is in auto mode.  Carbohydrate ratios were changed as follows:  1: 9 at lunch and 1: 14 at dinnertime

## 2017-10-07 ENCOUNTER — Other Ambulatory Visit (HOSPITAL_COMMUNITY): Payer: Self-pay

## 2017-10-07 DIAGNOSIS — F331 Major depressive disorder, recurrent, moderate: Secondary | ICD-10-CM

## 2017-10-07 MED ORDER — LAMOTRIGINE 100 MG PO TABS: 300.0000 mg | ORAL_TABLET | Freq: Every day | ORAL | 0 refills | Status: DC

## 2017-10-07 MED ORDER — BUPROPION HCL ER (XL) 300 MG PO TB24: 300.0000 mg | ORAL_TABLET | Freq: Every day | ORAL | 0 refills | Status: DC

## 2017-10-07 MED ORDER — HYDROXYZINE PAMOATE 50 MG PO CAPS: 50.0000 mg | ORAL_CAPSULE | Freq: Every day | ORAL | 0 refills | Status: DC

## 2017-10-07 NOTE — Telephone Encounter (Signed)
Pt called back and was given MD message. Pt stated that she knew the doctor changed something, she just could not remember what it was. Pt was given the Carb ratios and pt verbalized understanding of this.

## 2017-10-07 NOTE — Telephone Encounter (Signed)
Called pt and attempted to leave voicemail but voicemail box is full.

## 2017-10-29 ENCOUNTER — Other Ambulatory Visit (HOSPITAL_COMMUNITY): Payer: Self-pay | Admitting: Psychiatry

## 2017-10-29 DIAGNOSIS — F331 Major depressive disorder, recurrent, moderate: Secondary | ICD-10-CM

## 2017-10-31 ENCOUNTER — Ambulatory Visit (HOSPITAL_COMMUNITY): Payer: 59 | Admitting: Psychiatry

## 2017-11-03 ENCOUNTER — Other Ambulatory Visit (HOSPITAL_COMMUNITY): Payer: Self-pay | Admitting: Psychiatry

## 2017-11-03 DIAGNOSIS — F331 Major depressive disorder, recurrent, moderate: Secondary | ICD-10-CM

## 2017-12-04 ENCOUNTER — Other Ambulatory Visit (HOSPITAL_COMMUNITY): Payer: Self-pay | Admitting: Psychiatry

## 2017-12-04 DIAGNOSIS — F331 Major depressive disorder, recurrent, moderate: Secondary | ICD-10-CM

## 2017-12-05 ENCOUNTER — Other Ambulatory Visit: Payer: Self-pay | Admitting: Endocrinology

## 2017-12-21 ENCOUNTER — Other Ambulatory Visit (HOSPITAL_COMMUNITY): Payer: Self-pay | Admitting: Psychiatry

## 2017-12-21 DIAGNOSIS — F331 Major depressive disorder, recurrent, moderate: Secondary | ICD-10-CM

## 2017-12-31 ENCOUNTER — Encounter (HOSPITAL_COMMUNITY): Payer: Self-pay | Admitting: Psychiatry

## 2017-12-31 ENCOUNTER — Other Ambulatory Visit (HOSPITAL_COMMUNITY): Payer: Self-pay

## 2017-12-31 ENCOUNTER — Ambulatory Visit (INDEPENDENT_AMBULATORY_CARE_PROVIDER_SITE_OTHER): Payer: No Typology Code available for payment source | Admitting: Psychiatry

## 2017-12-31 VITALS — BP 118/70 | Ht 63.5 in | Wt 141.0 lb

## 2017-12-31 DIAGNOSIS — F411 Generalized anxiety disorder: Secondary | ICD-10-CM

## 2017-12-31 DIAGNOSIS — F410 Panic disorder [episodic paroxysmal anxiety] without agoraphobia: Secondary | ICD-10-CM | POA: Diagnosis not present

## 2017-12-31 DIAGNOSIS — F331 Major depressive disorder, recurrent, moderate: Secondary | ICD-10-CM | POA: Diagnosis not present

## 2017-12-31 MED ORDER — HYDROXYZINE PAMOATE 50 MG PO CAPS: 50.0000 mg | ORAL_CAPSULE | Freq: Every day | ORAL | 0 refills | Status: DC

## 2017-12-31 MED ORDER — LAMOTRIGINE 150 MG PO TABS: 300.0000 mg | ORAL_TABLET | Freq: Every day | ORAL | 0 refills | Status: DC

## 2017-12-31 MED ORDER — BUPROPION HCL ER (XL) 300 MG PO TB24: 300.0000 mg | ORAL_TABLET | Freq: Every day | ORAL | 0 refills | Status: DC

## 2017-12-31 MED ORDER — LAMOTRIGINE 100 MG PO TABS: 300.0000 mg | ORAL_TABLET | Freq: Every day | ORAL | 0 refills | Status: DC

## 2017-12-31 NOTE — Progress Notes (Signed)
BH MD/PA/NP OP Progress Note  12/31/2017 10:28 AM Sierra Schneider  MRN:  [id number]  Chief Complaint: I am not sleeping good because I do not have hydroxyzine.  HPI: Bonita Quin came for her follow-up appointment.  She endorsed increased insomnia and anxiety because she is out of Vistaril.  She endorsed her mood is stable since Lamictal dose increased on her last visit.  She is getting a better control on her blood sugar.  She denies any irritability, feeling of hopelessness or worthlessness.  She still anxious and overwhelmed about her children.  She is pleased that her daughter blood test did go well and she does not have diabetes.  Her 49 year old son is also handling his diabetes much better than she anticipated.  Patient denies any paranoia, hallucination or any suicidal thoughts.  She is compliant with Wellbutrin and Lamictal.  She has no rash or itching.  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  She is not interested in therapy at this time.  Patient denies drinking or using any illegal substances.  Her appetite is okay.  Her energy level is okay.  Her last hemoglobin A1c was 7.1 which is better than 7.8 last December.  Visit Diagnosis:    ICD-10-CM   1. Moderate episode of recurrent major depressive disorder (HCC) F33.1 hydrOXYzine (VISTARIL) 50 MG capsule    lamoTRIgine (LAMICTAL) 150 MG tablet    buPROPion (WELLBUTRIN XL) 300 MG 24 hr tablet  2. GAD (generalized anxiety disorder) F41.1   3. Panic attack F41.0     Past Psychiatric History: Reviewed. Patient had history of depression when her daughter was born. She took Prozac, BuSpar, Effexor, Zoloft and Cymbalta. Patient denies any history of suicidal attempt or any psychiatric inpatient treatment. She denies any history of mania psychosis or any hallucination.  Past Medical History:  Past Medical History:  Diagnosis Date  . Depression   . Diabetes mellitus 08/2009   late onset type  1  . Diabetes mellitus type I [facility])     Past Surgical History:  Procedure Laterality Date  . CESAREAN SECTION    . ESOPHAGOGASTRODUODENOSCOPY Left 10/14/2012   Procedure: ESOPHAGOGASTRODUODENOSCOPY (EGD);  Surgeon: Vertell Novak., MD;  Location: [facility];  Service: Endoscopy;  Laterality: Left;  . WISDOM TOOTH EXTRACTION      Family Psychiatric History: Reviewed.  Family History:  Family History  Problem Relation Age of Onset  . Depression Mother   . Hypertension Father   . Hyperlipidemia Father   . Kidney disease Paternal Grandmother   . Diabetes Maternal Grandfather   . ADD / ADHD Brother   . Depression Brother   . Drug abuse Brother   . Depression Maternal Aunt   . Depression Paternal Aunt   . Psychosis Cousin   . Heart disease Neg Hx     Social History:  Social History   Socioeconomic History  . Marital status: Single    Spouse name: Not on file  . Number of children: Not on file  . Years of education: Not on file  . Highest education level: Not on file  Occupational History  . Not on file  Social Needs  . Financial resource strain: Not on file  . Food insecurity:    Worry: Not on file    Inability: Not on file  . Transportation needs:    Medical: Not on file    Non-medical: Not on file  Tobacco Use  . Smoking status: Former Smoker    Packs/day: 0.00    Last attempt to quit: 01/26/2014    Years since quitting: 3.9  . Smokeless tobacco: Never Used  Substance and Sexual Activity  . Alcohol use: Yes    Comment: 1 drink every 3 months  . Drug use: No  . Sexual activity: Yes    Birth control/protection: IUD    Comment: mirena  Lifestyle  . Physical activity:    Days per week: Not on file    Minutes per session: Not on file  . Stress: Not on file  Relationships  . Social connections:    Talks on phone: Not on file    Gets together: Not on file    Attends religious service: Not on file    Active member of club or organization: Not on file     Attends meetings of clubs or organizations: Not on file    Relationship status: Not on file  Other Topics Concern  . Not on file  Social History Narrative   Patient goes to school full-time at [employer]. Patient works part-time at [employer]. Is engaged. Having relationship difficulties. Has 2 children-ages 2 and 8.    Allergies: No Known Allergies  Metabolic Disorder Labs: Lab Results  Component Value Date   HGBA1C 7.2 (H) 09/09/2017   MPG 189 (H) 03/13/2014   MPG 189 (H) 04/10/2013   No results found for: PROLACTIN Lab Results  Component Value Date   CHOL 125 09/09/2017   TRIG 61.0 09/09/2017   HDL 50.70 09/09/2017   CHOLHDL 2 09/09/2017   VLDL 12.2 09/09/2017   LDLCALC 63 09/09/2017   LDLCALC 79 02/21/2017   Lab Results  Component Value Date   TSH 1.84 02/21/2017   TSH 1.26 05/07/2016    Therapeutic Level Labs: No results found for: LITHIUM No results found for: VALPROATE No components found for:  CBMZ  Current Medications: Current Outpatient Medications  Medication Sig Dispense Refill  . buPROPion (WELLBUTRIN XL) 300 MG 24 hr tablet Take 1 tablet (300 mg total) by mouth daily. 30 tablet 0  . FIASP 100 UNIT/ML SOLN USE 80 UNITS PER INSULIN PUMP PER DAY. 30 mL 2  . hydrOXYzine (VISTARIL) 50 MG capsule Take 1 capsule (50 mg total) by mouth at bedtime. 30 capsule 0  . lamoTRIgine (LAMICTAL) 100 MG tablet Take 3 tablets (300 mg total) by mouth daily. 270 tablet 0  . rosuvastatin (CRESTOR) 20 MG tablet TAKE 1 TABLET BY MOUTH EVERY DAY 30 tablet 3  . oxymetazoline (AFRIN NASAL SPRAY) 0.05 % nasal spray Place 1 spray into both nostrils 2 (two) times daily. DO NOT USE FOR MORE THAN THREE DAYS. (Patient not taking: Reported on 12/31/2017) 30 mL 0   No current facility-administered medications for this visit.      Musculoskeletal: Strength & Muscle Tone: within normal limits Gait & Station: normal Patient leans: N/A  Psychiatric Specialty Exam: ROS   Blood pressure 118/70, height 5' 3.5" (1.613 m), weight 141 lb (64 kg).Body mass index is 24.59 kg/m.  General Appearance: Casual  Eye Contact:  Good  Speech:  Clear and Coherent  Volume:  Normal  Mood:  Anxious  Affect:  Congruent  Thought Process:  Goal Directed  Orientation:  Full (Time, Place, and Person)  Thought Content: Logical   Suicidal Thoughts:  No  Homicidal Thoughts:  No  Memory:  Immediate;   Good Recent;   Good Remote;   Good  Judgement:  Good  Insight:  Good  Psychomotor Activity:  Normal  Concentration:  Concentration: Good and Attention Span: Good  Recall:  Good  Fund of Knowledge: Good  Language: Good  Akathisia:  No  Handed:  Right  AIMS (if indicated): not done  Assets:  Communication Skills Desire for Improvement Housing Resilience  ADL's:  Intact  Cognition: WNL  Sleep:  Fair   Screenings: GAD-7     Counselor from 07/12/2016 in BEHAVIORAL HEALTH OUTPATIENT THERAPY [place]  Total GAD-7 Score  15    PHQ2-9     Office Visit from 03/21/2017 in [facility] Counselor from 07/12/2016 in BEHAVIORAL HEALTH OUTPATIENT THERAPY [place] Office Visit from 06/26/2016 in [facility] Office Visit from 05/07/2016 in [facility] Office Visit from 04/05/2016 in [facility]  PHQ-2 Total Score  0  3  2  2   0  PHQ-9 Total Score  -  13  -  -  -       Assessment and Plan: Major depressive disorder, recurrent.  Generalized anxiety disorder.  Panic attacks.  Patient doing better on her current medication.  Recommended to remain compliant with her hydroxyzine which is helping her anxiety and insomnia.  Continue Wellbutrin XL 300 mg daily and Lamictal 300 mg daily.  She has no rash, itching or tremors.  Recommended to call us back if she has any question or any concern.  If she decided therapy then she should call us.  Follow-up in 3 months.   Cleotis Nipper, MD 12/31/2017, 10:28 AM

## 2018-01-03 ENCOUNTER — Ambulatory Visit: Payer: Self-pay | Admitting: Family Medicine

## 2018-01-13 ENCOUNTER — Other Ambulatory Visit: Payer: Self-pay | Admitting: Endocrinology

## 2018-01-21 ENCOUNTER — Telehealth (HOSPITAL_COMMUNITY): Payer: Self-pay

## 2018-01-21 DIAGNOSIS — F410 Panic disorder [episodic paroxysmal anxiety] without agoraphobia: Secondary | ICD-10-CM

## 2018-01-21 DIAGNOSIS — F331 Major depressive disorder, recurrent, moderate: Secondary | ICD-10-CM

## 2018-01-21 NOTE — Telephone Encounter (Signed)
Message regarding Shade FloodMelinda Carias needs to go to Dr. Lolly MustacheArfeen

## 2018-01-21 NOTE — Telephone Encounter (Signed)
Patients mother is calling - she states that someone in her house (guest) emptied all of patients Vyvanse capsules, put them back together and back in the bottle. She has spoken to the pharmacy and insurance, they will cover a new prescription if you are willing to send one in.. Please review and advise, thank you

## 2018-01-21 NOTE — Telephone Encounter (Signed)
Sorry, I put this in the wrong chart. This patient is calling due to increased anxiety - she states that she had a panic attack over the weekend and would like to know if you can prescribe something for her anxiety. Please review and advise, thank you

## 2018-01-27 ENCOUNTER — Other Ambulatory Visit (INDEPENDENT_AMBULATORY_CARE_PROVIDER_SITE_OTHER): Payer: Self-pay

## 2018-01-27 DIAGNOSIS — E1065 Type 1 diabetes mellitus with hyperglycemia: Secondary | ICD-10-CM

## 2018-01-27 LAB — BASIC METABOLIC PANEL
BUN: 7 mg/dL (ref 6–23)
CALCIUM: 9.3 mg/dL (ref 8.4–10.5)
CO2: 27 mEq/L (ref 19–32)
Chloride: 103 mEq/L (ref 96–112)
Creatinine, Ser: 0.72 mg/dL (ref 0.40–1.20)
GFR: 98.63 mL/min (ref >60.00)
Glucose, Bld: 202 mg/dL — ABNORMAL HIGH (ref 70–99)
Potassium: 4.2 mEq/L (ref 3.5–5.1)
Sodium: 139 mEq/L (ref 135–145)

## 2018-01-27 LAB — HEMOGLOBIN A1C: Hgb A1c MFr Bld: 6.8 % — ABNORMAL HIGH (ref 4.6–6.5)

## 2018-01-28 ENCOUNTER — Encounter: Payer: Self-pay | Admitting: Family Medicine

## 2018-01-28 NOTE — Telephone Encounter (Signed)
She can try extra dose of Vistaril 50 mg to help her panic attacks.

## 2018-01-31 ENCOUNTER — Encounter: Payer: Self-pay | Admitting: Endocrinology

## 2018-01-31 ENCOUNTER — Ambulatory Visit (INDEPENDENT_AMBULATORY_CARE_PROVIDER_SITE_OTHER): Payer: No Typology Code available for payment source | Admitting: Endocrinology

## 2018-01-31 VITALS — BP 116/80 | HR 91 | Ht 63.5 in | Wt 141.2 lb

## 2018-01-31 DIAGNOSIS — E1065 Type 1 diabetes mellitus with hyperglycemia: Secondary | ICD-10-CM | POA: Diagnosis not present

## 2018-01-31 DIAGNOSIS — E7800 Pure hypercholesterolemia, unspecified: Secondary | ICD-10-CM | POA: Diagnosis not present

## 2018-01-31 DIAGNOSIS — Z23 Encounter for immunization: Secondary | ICD-10-CM

## 2018-01-31 LAB — MICROALBUMIN / CREATININE URINE RATIO
Creatinine,U: 117 mg/dL
Microalb Creat Ratio: 1.1 mg/g (ref 0.0–30.0)
Microalb, Ur: 1.2 mg/dL (ref 0.0–1.9)

## 2018-01-31 MED ORDER — CLONAZEPAM 0.5 MG PO TABS: ORAL_TABLET | ORAL | 0 refills | Status: DC

## 2018-01-31 NOTE — Progress Notes (Signed)
Patient ID: Sierra Schneider, female   DOB: 11-Feb-1986, 32 y.o.   MRN: [id number]   Reason for Appointment: Diabetes followup:   History of Present Illness   Diagnosis: Type 1 DIABETES MELITUS     DIABETES history: She has had diabetes since about 2011 and has been on insulin pump for several years She had fairly good control in the first 2 years of her disease but blood sugar has been more difficult subsequently  CURRENT insulin pump:  Medtronic 670 G  SETTINGS are:    Basal rate on current download = Midnight = 1.2, 7 AM = 1.4, 10 AM = 0.6, 12 noon = 1.3, 6 PM = 1.05 and 7 PM = 0.9  Total basal insulin = about 24 units Carb Ratio: 1:9.  1: 14 at 5 PM Correction factor 1: 30, target 100 Active insulin time 3 hours  Her A1c has been improving and now down to 6.8   Current management, blood sugar patterns and problems identified:  Pump data: She has been the AUTO mode for 77 % the time in the last 2 weeks Reasons for exiting auto mode are not using sensor, and identified and minimal delivery  CGM use % of time 73  Average and SD  150+/-50  Time in range  71 %, was 76  % Time Above 180  22  % Time above 250  4  % Time Below target  3      PRE-MEAL Fasting Lunch Dinner Bedtime Overall  Glucose range:       Mean/median:  167 148  150   POST-MEAL PC Breakfast PC Lunch PC Dinner  Glucose range:     Mean/median:  139 166 155     BLOOD SUGAR PATTERNS, management and problems:   HYPERGLYCEMIA is occurring mostly around 9-10 PM   This is despite 2-hour postprandial readings not being consistently high and her active insulin time being only 3 hours  Some evening meals may be high in fat  Blood sugars are relatively well controlled after lunchtime although occasionally may be low normal in the afternoon  She has not used the sensor consistently partly from not charging on time  Blood sugars when she is on the manual mode are fairly high in the morning  hours  HYPOGLYCEMIA may occasionally be related to over correcting high readings    Wt Readings from Last 3 Encounters:  01/31/18 141 lb 3.2 oz (64 kg)  09/13/17 142 lb 9.6 oz (64.7 kg)  06/11/17 148 lb 9.6 oz (67.4 kg)     LABS:  Lab Results  Component Value Date   HGBA1C 6.8 (H) 01/27/2018   HGBA1C 7.2 (H) 09/09/2017   HGBA1C 7.1 (H) 06/05/2017   Lab Results  Component Value Date   MICROALBUR <0.7 02/21/2017   LDLCALC 63 09/09/2017   CREATININE 0.72 01/27/2018    OTHER active problems discussed today: See review of systems    Allergies as of 01/31/2018   No Known Allergies     Medication List        Accurate as of 01/31/18  3:43 PM. Always use your most recent med list.          buPROPion 300 MG 24 hr tablet Commonly known as:  WELLBUTRIN XL Take 1 tablet (300 mg total) by mouth daily.   clonazePAM 0.5 MG tablet Commonly known as:  KLONOPIN Take one tablet for extreme anxiety, not to exceed one a day   FIASP  100 UNIT/ML Soln Generic drug:  Insulin Aspart (w/Niacinamide) USE 80 UNITS PER INSULIN PUMP PER DAY.   hydrOXYzine 50 MG capsule Commonly known as:  VISTARIL Take 1 capsule (50 mg total) by mouth at bedtime.   lamoTRIgine 150 MG tablet Commonly known as:  LAMICTAL Take 2 tablets (300 mg total) by mouth daily.   rosuvastatin 20 MG tablet Commonly known as:  CRESTOR TAKE 1 TABLET BY MOUTH EVERY DAY       Allergies: No Known Allergies  Past Medical History:  Diagnosis Date  . Depression   . Diabetes mellitus 08/2009   late onset type 1  . Diabetes mellitus type I [facility](HCC)     Past Surgical History:  Procedure Laterality Date  . CESAREAN SECTION    . ESOPHAGOGASTRODUODENOSCOPY Left 10/14/2012   Procedure: ESOPHAGOGASTRODUODENOSCOPY (EGD);  Surgeon: Vertell NovakJames L Edwards Jr., MD;  [facility];  Service: Endoscopy;  Laterality: Left;  . WISDOM TOOTH EXTRACTION      Family History  Problem Relation Age of Onset  . Depression  Mother   . Hypertension Father   . Hyperlipidemia Father   . Kidney disease Paternal Grandmother   . Diabetes Maternal Grandfather   . ADD / ADHD Brother   . Depression Brother   . Drug abuse Brother   . Depression Maternal Aunt   . Depression Paternal Aunt   . Psychosis Cousin   . Heart disease Neg Hx     Social History:  reports that she quit smoking about 4 years ago. She smoked 0.00 packs per day. She has never used smokeless tobacco. She reports that she drinks alcohol. She reports that she does not use drugs.  REVIEW of systems:   She has had  HYPERCHOLESTEROLEMIA,  Because of her family history of hypercholesterolemia and her diabetes she was previously on pravastatin, now on Crestor 20 mg with baseline of LDL of 146  LDL well controlled as follows:  Lab Results  Component Value Date   CHOL 125 09/09/2017   CHOL 140 02/21/2017   CHOL 128 04/23/2016   Lab Results  Component Value Date   HDL 50.70 09/09/2017   HDL 49.70 02/21/2017   HDL 46.10 04/23/2016   Lab Results  Component Value Date   LDLCALC 63 09/09/2017   LDLCALC 79 02/21/2017   LDLCALC 51 04/23/2016   Lab Results  Component Value Date   TRIG 61.0 09/09/2017   TRIG 59.0 02/21/2017   TRIG 153.0 (H) 04/23/2016   Lab Results  Component Value Date   CHOLHDL 2 09/09/2017   CHOLHDL 3 02/21/2017   CHOLHDL 3 04/23/2016   Lab Results  Component Value Date   LDLDIRECT 129.4 12/29/2012      She is asking about a fluttering in the upper abdomen that is visible and does not think she has palpitations This is painless  EXAM:  BP 116/80 (BP Location: Left Arm, Patient Position: Sitting, Cuff Size: Normal)   Pulse 91   Ht 5' 3.5" (1.613 m)   Wt 141 lb 3.2 oz (64 kg)   SpO2 99%   BMI 24.62 kg/m   Upper abdominal exam is normal No tenderness No midline hernia present  ASSESSMENT:    Type I diabetes on Medtronic 670 pump  See history of present illness for detailed discussion of  current  management, blood sugar patterns and problems identified   A1c has now come down below 7%   She has not been able to use the sensor consistently partly  for Financial reasons and also not charging her transmitter consistently She does need some more attention to bolusing more appropriately for various types of meals including high fat as well as any snacks after supper  Day-to-day pump insulin management, diet, mealtime coverage, mealtime boluses, weight management were discussed today and counseling was over 50% of the total visit time today  No diabetes complications at the present including normal microalbumin  Recommendations:  She needs to make sure her sensor is ready to use and charged and not get out of the auto mode as much Needs to bolus consistently before starting to eat Consider consultation with dietitian to help estimate carbohydrates and type of coverage needed for meals No change in active insulin at this time Reduce sensitivity to 1: 35 to avoid over correcting high readings Cover all snacks with carbohydrate intake especially in the evenings 7 AM basal rate to be 1.55 instead of 1.4   Reassured her that her twitching that she is observing in the epigastric area may be a tic rather than any internal problem, she will follow-up with PCP  Influenza vaccine given  There are no Patient Instructions on file for this visit.  Counseling time on subjects discussed in assessment and plan sections is over 50% of today's 25 minute visit    Reather Littler 01/31/2018, 3:43 PM

## 2018-01-31 NOTE — Telephone Encounter (Signed)
Patient was already taking up to 3 Vistaril a day. I spoke with Dr. Lolly MustacheArfeen and he gave an order for Buspar. I called the patient and she advised me she has taken Buspar in the past and it did not work. I spoke with Dr. Lolly MustacheArfeen again and he ordered Klonopin 0.5 mg to take only when having a panic attack. I called patient back and explained what we were doing, patient agreed to the plan. An order was called into the pharmacy for 10 tablets.

## 2018-02-06 ENCOUNTER — Encounter: Payer: Self-pay | Admitting: Family Medicine

## 2018-02-06 ENCOUNTER — Other Ambulatory Visit: Payer: Self-pay

## 2018-02-06 ENCOUNTER — Ambulatory Visit (INDEPENDENT_AMBULATORY_CARE_PROVIDER_SITE_OTHER): Payer: No Typology Code available for payment source | Admitting: Family Medicine

## 2018-02-06 VITALS — BP 102/68 | HR 89 | Temp 98.0°F | Ht 64.0 in | Wt 142.4 lb

## 2018-02-06 DIAGNOSIS — Z0000 Encounter for general adult medical examination without abnormal findings: Secondary | ICD-10-CM

## 2018-02-06 DIAGNOSIS — Z23 Encounter for immunization: Secondary | ICD-10-CM | POA: Diagnosis not present

## 2018-02-06 DIAGNOSIS — R109 Unspecified abdominal pain: Secondary | ICD-10-CM | POA: Insufficient documentation

## 2018-02-06 DIAGNOSIS — F411 Generalized anxiety disorder: Secondary | ICD-10-CM

## 2018-02-06 LAB — POCT URINE PREGNANCY: PREG TEST UR: NEGATIVE

## 2018-02-06 NOTE — Progress Notes (Signed)
Subjective:    Sierra Schneider - 32 y.o. female MRN [id number]  Date of birth: [birth year]  CC: annual physical  HPI  Sierra Schneider is here for her annual physical.  Her concerns include worsening anxiety and "fluttering" in her epigastric area.  Epigastric fluttering - no aggravating or alleviating factors - not positional - does not wake her up - no heartburn - does not feel that it occurs when she is anxious - started about two months ago - seemed to get more frequent but now is less frequent - feels like the fluttering feeling of feeling a baby kicking early in pregnancy  Anxiety - has taken Buspar in the past, which wasn't effective - takes Klonopin, prescribed by her psychiatrist - also takes Lamictal which seemed to be very helpful, except that her anxiety has now worsened over the past month  Review of Systems  Constitutional: Negative for fever, malaise/fatigue and weight loss.  HENT: Negative for congestion.   Respiratory: Negative for cough.   Cardiovascular: Negative for chest pain and palpitations.  Gastrointestinal: Negative for abdominal pain, constipation, diarrhea, nausea and vomiting.  Genitourinary: Negative for dysuria.  Musculoskeletal: Negative for myalgias.  Neurological: Negative for headaches.  Psychiatric/Behavioral: Negative for depression and suicidal ideas. The patient is nervous/anxious.     Health Maintenance:  Health Maintenance Due  Topic Date Due  . FOOT EXAM  03/19/1994  . TETANUS/TDAP  03/20/2003  . PAP SMEAR-Modifier  12/31/2013  . OPHTHALMOLOGY EXAM  04/27/2015    -  reports that she quit smoking about 4 years ago. She smoked 0.00 packs per day. She has never used smokeless tobacco. - Review of Systems: Per HPI. - Past Medical History: Patient Active Problem List   Diagnosis Date Noted  . Annual physical exam 02/06/2018  . Abdominal spasms 02/06/2018  . Vaccine for diphtheria-tetanus-pertussis, combined 02/06/2018  . IUD  (intrauterine device) in place 04/10/2016  . Insomnia 03/20/2016  . Idiopathic edema 05/10/2015  . Hidradenitis axillaris 03/16/2014  . Generalized anxiety disorder 06/29/2013  . Pure hypercholesterolemia 01/03/2013  . Cough 06/13/2011  . Depression 12/01/2010  . Uncontrolled type 1 diabetes mellitus (HCC) 09/14/2009   - Medications: reviewed and updated   Objective:   Physical Exam BP 102/68   Pulse 89   Temp 98 F (36.7 C) (Oral)   Ht 5\' 4"  (1.626 m)   Wt 142 lb 6.4 oz (64.6 kg)   SpO2 99%   BMI 24.44 kg/m  Gen: NAD, alert, cooperative with exam, well-appearing HEENT: NCAT, PERRL, clear conjunctiva, oropharynx clear, supple neck, no lymphadenopathy CV: RRR, good S1/S2, no murmur, no edema, capillary refill brisk  Resp: CTABL, no wheezes, non-labored Abd: SNTND, BS present, no guarding or organomegaly Skin: no rashes, normal turgor  Neuro: no gross deficits.  Psych: good insight, alert and oriented, anxious mood, appropriate affect  [facility] 02/06/2018  Decreased Interest 0  Down, Depressed, Hopeless 0  PHQ - 2 Score 0  Altered sleeping 0  Tired, decreased energy 0  Change in appetite 0  Feeling bad or failure about yourself  0  Trouble concentrating 0  Moving slowly or fidgety/restless 0  Suicidal thoughts 0  PHQ-9 Score 0  Difficult doing work/chores -  Some encounter information is confidential and restricted. Go to Review Flowsheets activity to see all data.   GAD 7 : Generalized Anxiety Score 02/06/2018  Nervous, Anxious, on Edge 3  Control/stop worrying 0  Worry too much - different things  0  Trouble relaxing 0  Restless 0  Easily annoyed or irritable 2  Afraid - awful might happen 0  Total GAD 7 Score 5  Anxiety Difficulty Very difficult  Some encounter information is confidential and restricted. Go to Review Flowsheets activity to see all data.       Assessment & Plan:   Annual physical exam Is up-to-date on her foot exam,  ophthalmology exam, but needs a Pap smear and tetanus.  Tetanus given today.  Patient knows to schedule a Pap smear at her earliest convenience.  Abdominal spasms Likely diaphragmatic or abdominal muscle spasms.  Reassured patient that this does not involve her esophagus or aorta, since she would be unable to visualize any spasms with these organs.  Since patient has had some spotting over the last few months and does not have periods while she is on the Mirena, will obtain a urine pregnancy test, although this is unlikely the cause since she has no other signs of pregnancy.  Generalized anxiety disorder Will not prescribe any medications since patient see psychiatry outpatient.  Discussed with patient lifestyle modifications and it appears that patient does not have many of these in place.  Encouraged patient to set boundaries with her children that will will allow her to have some time to herself and encourage patient to take walks outside since most people find this helpful and she has found this helpful in the past.  She is agreeable to this plan.    Lezlie Octave, M.D. 02/06/2018, 10:14 AM PGY-2, [facility]

## 2018-02-06 NOTE — Assessment & Plan Note (Signed)
Is up-to-date on her foot exam, ophthalmology exam, but needs a Pap smear and tetanus.  Tetanus given today.  Patient knows to schedule a Pap smear at her earliest convenience.

## 2018-02-06 NOTE — Assessment & Plan Note (Signed)
Will not prescribe any medications since patient see psychiatry outpatient.  Discussed with patient lifestyle modifications and it appears that patient does not have many of these in place.  Encouraged patient to set boundaries with her children that will will allow her to have some time to herself and encourage patient to take walks outside since most people find this helpful and she has found this helpful in the past.  She is agreeable to this plan.

## 2018-02-06 NOTE — Patient Instructions (Addendum)
It was nice meeting you today [name]!  Today, we discussed the fluttering feeling you are having in the top of your abdomen, which is most likely a muscle spasm either of your abdominal muscles or your diaphragm.  We are getting a pregnancy test to rule pregnancy out, although this is highly unlikely.  Please work on going outside to walk to help curb your anxiety.  Please also continue creating some time for yourself each day.  If you have any questions or concerns, please feel free to call the clinic.   Be well,  Dr. Frances FurbishWinfrey

## 2018-02-06 NOTE — Assessment & Plan Note (Signed)
Likely diaphragmatic or abdominal muscle spasms.  Reassured patient that this does not involve her esophagus or aorta, since she would be unable to visualize any spasms with these organs.  Since patient has had some spotting over the last few months and does not have periods while she is on the Mirena, will obtain a urine pregnancy test, although this is unlikely the cause since she has no other signs of pregnancy.

## 2018-02-11 ENCOUNTER — Telehealth: Payer: Self-pay | Admitting: Endocrinology

## 2018-02-11 NOTE — Telephone Encounter (Signed)
Can we give patient a sample of this please advise Sierra Schneider of this

## 2018-02-11 NOTE — Telephone Encounter (Signed)
Patient called and was asking if we have a sample of Fiasp.  Her last bottle was accidentally broken and she can not get a refill   Call back number is [phone]

## 2018-02-11 NOTE — Telephone Encounter (Signed)
Called pt and she stated that she would come to our office and pick up the sample on 12/18 around 2-3pm

## 2018-02-11 NOTE — Telephone Encounter (Signed)
She can have a sample if it is available

## 2018-02-14 ENCOUNTER — Other Ambulatory Visit: Payer: Self-pay

## 2018-02-14 ENCOUNTER — Ambulatory Visit (INDEPENDENT_AMBULATORY_CARE_PROVIDER_SITE_OTHER): Payer: Self-pay | Admitting: Family Medicine

## 2018-02-14 VITALS — BP 104/60 | HR 74 | Temp 98.0°F | Wt 141.0 lb

## 2018-02-14 DIAGNOSIS — J0100 Acute maxillary sinusitis, unspecified: Secondary | ICD-10-CM | POA: Insufficient documentation

## 2018-02-14 MED ORDER — AMOXICILLIN-POT CLAVULANATE 875-125 MG PO TABS: 1.0000 | ORAL_TABLET | Freq: Two times a day (BID) | ORAL | 0 refills | Status: DC

## 2018-02-14 NOTE — Assessment & Plan Note (Signed)
Patient presents with 2 days of URI symptoms with cough, congestion, headaches. Patient also endorses general fatigue/myalgia.  Foul tasting drainage suspicious for  possible developing sinusitis. No concern for pneumonia as patient is not short of breath or complaining of chest pain. Will prescribe Augmentin for 10 days. She will follow up on a as needed basis.

## 2018-02-14 NOTE — Patient Instructions (Signed)
Sinusitis, Adult  Sinusitis is inflammation of your sinuses. Sinuses are hollow spaces in the bones around your face. Your sinuses are located:   Around your eyes.   In the middle of your forehead.   Behind your nose.   In your cheekbones.  Mucus normally drains out of your sinuses. When your nasal tissues become inflamed or swollen, mucus can become trapped or blocked. This allows bacteria, viruses, and fungi to grow, which leads to infection. Most infections of the sinuses are caused by a virus.  Sinusitis can develop quickly. It can last for up to 4 weeks (acute) or for more than 12 weeks (chronic). Sinusitis often develops after a cold.  What are the causes?  This condition is caused by anything that creates swelling in the sinuses or stops mucus from draining. This includes:   Allergies.   Asthma.   Infection from bacteria or viruses.   Deformities or blockages in your nose or sinuses.   Abnormal growths in the nose (nasal polyps).   Pollutants, such as chemicals or irritants in the air.   Infection from fungi (rare).  What increases the risk?  You are more likely to develop this condition if you:   Have a weak body defense system (immune system).   Do a lot of swimming or diving.   Overuse nasal sprays.   Smoke.  What are the signs or symptoms?  The main symptoms of this condition are pain and a feeling of pressure around the affected sinuses. Other symptoms include:   Stuffy nose or congestion.   Thick drainage from your nose.   Swelling and warmth over the affected sinuses.   Headache.   Upper toothache.   A cough that may get worse at night.   Extra mucus that collects in the throat or the back of the nose (postnasal drip).   Decreased sense of smell and taste.   Fatigue.   A fever.   Sore throat.   Bad breath.  How is this diagnosed?  This condition is diagnosed based on:   Your symptoms.   Your medical history.   A physical exam.   Tests to find out if your condition is  acute or chronic. This may include:  ? Checking your nose for nasal polyps.  ? Viewing your sinuses using a device that has a light (endoscope).  ? Testing for allergies or bacteria.  ? Imaging tests, such as an MRI or CT scan.  In rare cases, a bone biopsy may be done to rule out more serious types of fungal sinus disease.  How is this treated?  Treatment for sinusitis depends on the cause and whether your condition is chronic or acute.   If caused by a virus, your symptoms should go away on their own within 10 days. You may be given medicines to relieve symptoms. They include:  ? Medicines that shrink swollen nasal passages (topical intranasal decongestants).  ? Medicines that treat allergies (antihistamines).  ? A spray that eases inflammation of the nostrils (topical intranasal corticosteroids).  ? Rinses that help get rid of thick mucus in your nose (nasal saline washes).   If caused by bacteria, your health care provider may recommend waiting to see if your symptoms improve. Most bacterial infections will get better without antibiotic medicine. You may be given antibiotics if you have:  ? A severe infection.  ? A weak immune system.   If caused by narrow nasal passages or nasal polyps, you may need   to have surgery.  Follow these instructions at home:  Medicines   Take, use, or apply over-the-counter and prescription medicines only as told by your health care provider. These may include nasal sprays.   If you were prescribed an antibiotic medicine, take it as told by your health care provider. Do not stop taking the antibiotic even if you start to feel better.  Hydrate and humidify     Drink enough fluid to keep your urine pale yellow. Staying hydrated will help to thin your mucus.   Use a cool mist humidifier to keep the humidity level in your home above 50%.   Inhale steam for 10-15 minutes, 3-4 times a day, or as told by your health care provider. You can do this in the bathroom while a hot shower is  running.   Limit your exposure to cool or dry air.  Rest   Rest as much as possible.   Sleep with your head raised (elevated).   Make sure you get enough sleep each night.  General instructions     Apply a warm, moist washcloth to your face 3-4 times a day or as told by your health care provider. This will help with discomfort.   Wash your hands often with soap and water to reduce your exposure to germs. If soap and water are not available, use hand sanitizer.   Do not smoke. Avoid being around people who are smoking (secondhand smoke).   Keep all follow-up visits as told by your health care provider. This is important.  Contact a health care provider if:   You have a fever.   Your symptoms get worse.   Your symptoms do not improve within 10 days.  Get help right away if:   You have a severe headache.   You have persistent vomiting.   You have severe pain or swelling around your face or eyes.   You have vision problems.   You develop confusion.   Your neck is stiff.   You have trouble breathing.  Summary   Sinusitis is soreness and inflammation of your sinuses. Sinuses are hollow spaces in the bones around your face.   This condition is caused by nasal tissues that become inflamed or swollen. The swelling traps or blocks the flow of mucus. This allows bacteria, viruses, and fungi to grow, which leads to infection.   If you were prescribed an antibiotic medicine, take it as told by your health care provider. Do not stop taking the antibiotic even if you start to feel better.   Keep all follow-up visits as told by your health care provider. This is important.  This information is not intended to replace advice given to you by your health care provider. Make sure you discuss any questions you have with your health care provider.  Document Released: 02/12/2005 Document Revised: 07/15/2017 Document Reviewed: 07/15/2017  Elsevier Interactive Patient Education  2019 [employer]

## 2018-02-14 NOTE — Progress Notes (Signed)
   Subjective:    Patient ID: Sierra Schneider, female    DOB: April 06, 1985, 32 y.o.   MRN: [id number]   CC: Sinus congestion   HPI: Patient is a 32 yo female who presents today complaining of sinus congestion, sever headaches and cough for the past 10 days. Patient reports she has had severe congestion and has been green nasal discharge. She has been unable to sleep because of congestion and headaches. She has tried OTC medicines for cough with minimal relief in her symptoms. She sometimes has bad tasting drainage from her nose. Patient denies any fever but feels tired and weak. She denies any sick contact but reports her immune system "sucks because she is diabetic" and it takes her a while to recover.  Patient denies any chest pain, shortness of breath, abdominal pain, dizziness.  Smoking status reviewed   ROS: all other systems were reviewed and are negative other than in the HPI   Past Medical History:  Diagnosis Date  . Depression   . Diabetes mellitus 08/2009   late onset type 1    Past Surgical History:  Procedure Laterality Date  . CESAREAN SECTION    . ESOPHAGOGASTRODUODENOSCOPY Left 10/14/2012   Procedure: ESOPHAGOGASTRODUODENOSCOPY (EGD);  Surgeon: Vertell NovakJames L Edwards Jr., MD;  [facility];  Service: Endoscopy;  Laterality: Left;  . WISDOM TOOTH EXTRACTION      Past medical history, surgical, family, and social history reviewed and updated in the EMR as appropriate.  Objective:  BP 104/60   Pulse 74   Temp 98 F (36.7 C) (Oral)   Wt 141 lb (64 kg)   BMI 24.20 kg/m   Vitals and nursing note reviewed  General: NAD, pleasant, able to participate in exam HEENT: Maxillary sinus tenderness on palpation, oropharynx clear. Cardiac: RRR, normal heart sounds, no murmurs. 2+ radial and PT pulses bilaterally Respiratory: CTAB, normal effort, No wheezes, rales or rhonchi Abdomen: soft, nontender, nondistended, no hepatic or splenomegaly, +BS Extremities: no edema or  cyanosis. WWP. Skin: warm and dry, no rashes noted Neuro: alert and oriented x4, no focal deficits Psych: Normal affect and mood   Assessment & Plan:   Acute non-recurrent maxillary sinusitis Patient presents with 2 days of URI symptoms with cough, congestion, headaches. Patient also endorses general fatigue/myalgia.  Foul tasting drainage suspicious for  possible developing sinusitis. No concern for pneumonia as patient is not short of breath or complaining of chest pain. Will prescribe Augmentin for 10 days. She will follow up on a as needed basis.    Lovena NeighboursAbdoulaye Tiwatope Emmitt, MD [facility] PGY-3

## 2018-03-11 ENCOUNTER — Telehealth: Payer: Self-pay | Admitting: Endocrinology

## 2018-03-11 NOTE — Telephone Encounter (Signed)
error 

## 2018-04-04 ENCOUNTER — Ambulatory Visit (HOSPITAL_COMMUNITY): Payer: Medicaid Other | Admitting: Psychiatry

## 2018-05-15 ENCOUNTER — Other Ambulatory Visit: Payer: Self-pay | Admitting: Endocrinology

## 2018-05-18 ENCOUNTER — Other Ambulatory Visit: Payer: Self-pay | Admitting: Endocrinology

## 2018-06-09 ENCOUNTER — Other Ambulatory Visit: Payer: Self-pay

## 2018-06-13 ENCOUNTER — Ambulatory Visit: Payer: Self-pay | Admitting: Endocrinology

## 2018-06-30 ENCOUNTER — Ambulatory Visit (INDEPENDENT_AMBULATORY_CARE_PROVIDER_SITE_OTHER): Payer: Medicaid Other | Admitting: Psychiatry

## 2018-06-30 ENCOUNTER — Encounter (HOSPITAL_COMMUNITY): Payer: Self-pay | Admitting: Psychiatry

## 2018-06-30 ENCOUNTER — Other Ambulatory Visit: Payer: Self-pay

## 2018-06-30 DIAGNOSIS — F411 Generalized anxiety disorder: Secondary | ICD-10-CM | POA: Diagnosis not present

## 2018-06-30 DIAGNOSIS — F410 Panic disorder [episodic paroxysmal anxiety] without agoraphobia: Secondary | ICD-10-CM | POA: Diagnosis not present

## 2018-06-30 DIAGNOSIS — F331 Major depressive disorder, recurrent, moderate: Secondary | ICD-10-CM

## 2018-06-30 MED ORDER — LAMOTRIGINE 150 MG PO TABS: 300.0000 mg | ORAL_TABLET | Freq: Every day | ORAL | 0 refills | Status: DC

## 2018-06-30 MED ORDER — BUPROPION HCL ER (XL) 300 MG PO TB24: 300.0000 mg | ORAL_TABLET | Freq: Every day | ORAL | 0 refills | Status: DC

## 2018-06-30 MED ORDER — CLONAZEPAM 0.5 MG PO TABS: ORAL_TABLET | ORAL | 1 refills | Status: DC

## 2018-06-30 MED ORDER — HYDROXYZINE PAMOATE 50 MG PO CAPS: ORAL_CAPSULE | ORAL | 0 refills | Status: DC

## 2018-06-30 NOTE — Progress Notes (Signed)
Virtual Visit via Telephone Note  I connected with Sierra Schneider on 06/30/18 at  3:00 PM EDT by telephone and verified that I am speaking with the correct person using two identifiers.   I discussed the limitations, risks, security and privacy concerns of performing an evaluation and management service by telephone and the availability of in person appointments. I also discussed with the patient that there may be a patient responsible charge related to this service. The patient expressed understanding and agreed to proceed.   History of Present Illness: Patient was seen through phone session.  She was last seen in November 2019.  She apologized missing the appointment.  She endorsed experiencing increased anxiety and panic attack.  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  She is very concerned about her job and current pandemic situation.  She is afraid that she may lose the job.  She admitted having panic attacks and poor sleep and crying spells.  She is taking hydroxyzine sometimes 3 capsule at night but is still not able to sleep more than few hours.  She has tried Klonopin in the past which helped her a lot with these panic attacks.  She endorsed crying spells but denies any suicidal thoughts or homicidal thoughts.  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  She is compliant with Wellbutrin, Lamictal and hydroxyzine.  She has no rash or itching.  Her appetite is okay.  Her energy level is fair.  Patient denies any aggression or violence.  She denies drinking or using any illegal substances.  She does not have a time for therapy.   Past Psychiatric History: Reviewed. H/O depression after her daughter born. Took Prozac, BuSpar, Effexor, Zoloft and Cymbalta. No H/O suicidal attempt or any psychiatric inpatient treatment, mania psychosis or any hallucination.  Observations/Objective: Mental status  examination done on the phone.  Patient describes her mood anxious and nervous.  Her speech is slow but clear and coherent.  Her attention and concentration is okay.  Her thought process logical and goal-directed.  There were no delusions, paranoia or any grandiosity.  She denies any auditory or visual hallucination.  She denies any active or passive suicidal thoughts or homicidal thought.  There were no flight of ideas or loose association.  She is alert and oriented x3.  Her fund of knowledge is adequate.  Her cognition is intact.  She reported no tremors, shakes or any EPS.  Her insight judgment is okay.  Assessment and Plan: Major depressive disorder, recurrent.  Generalized anxiety disorder.  Panic attacks.  Discussed psychosocial issues related to pandemic coronavirus.  She is experiencing increased anxiety and having panic attacks.  She had a good response with the Klonopin which was given in January but she has no more refills.  I recommend to restart the Klonopin 0.5 mg to take as needed for severe anxiety and panic attack.  Continue Lamictal 300 mg daily and Wellbutrin XL 300 mg daily.  At this time patient is not have enough time for therapy.  I recommend to call us back if symptoms continue to get worse.  We discussed adding medication if patient do not see any improvement.  Reassurance given.  Discussed safety concern that anytime having active suicidal thoughts or homicidal thought that she need to call 911 or go to local emergency room.  Follow-up in 2 months.  Follow Up Instructions:  I discussed the assessment and treatment plan with the patient. The patient was provided an opportunity to ask questions and all were answered. The patient agreed with the plan and demonstrated an understanding of the instructions.   The patient was advised to call back or seek an in-person evaluation if the symptoms worsen or if the condition fails to improve as anticipated.  I provided 20 minutes of  non-face-to-face time during this encounter.   Cleotis NipperSyed T Diron Haddon, MD

## 2018-07-01 ENCOUNTER — Telehealth: Payer: Self-pay

## 2018-07-01 NOTE — Telephone Encounter (Signed)
NS'd for lab appt 06/09/18 and canceled f/u appt on 06/13/18 with Dr. Lucianne Muss. LVM requesting returned call to reschedule appts.

## 2018-07-16 ENCOUNTER — Other Ambulatory Visit (HOSPITAL_COMMUNITY): Payer: Self-pay | Admitting: Psychiatry

## 2018-07-16 DIAGNOSIS — F331 Major depressive disorder, recurrent, moderate: Secondary | ICD-10-CM

## 2018-07-31 ENCOUNTER — Telehealth: Payer: Self-pay

## 2018-07-31 ENCOUNTER — Ambulatory Visit (INDEPENDENT_AMBULATORY_CARE_PROVIDER_SITE_OTHER): Payer: Medicaid Other | Admitting: Family Medicine

## 2018-07-31 ENCOUNTER — Other Ambulatory Visit: Payer: Self-pay

## 2018-07-31 ENCOUNTER — Encounter: Payer: Self-pay | Admitting: Family Medicine

## 2018-07-31 DIAGNOSIS — G2581 Restless legs syndrome: Secondary | ICD-10-CM | POA: Diagnosis not present

## 2018-07-31 DIAGNOSIS — E1065 Type 1 diabetes mellitus with hyperglycemia: Secondary | ICD-10-CM | POA: Diagnosis not present

## 2018-07-31 DIAGNOSIS — Z975 Presence of (intrauterine) contraceptive device: Secondary | ICD-10-CM | POA: Diagnosis not present

## 2018-07-31 MED ORDER — ROPINIROLE HCL 0.5 MG PO TABS: 0.5000 mg | ORAL_TABLET | Freq: Every day | ORAL | 2 refills | Status: DC

## 2018-07-31 NOTE — Telephone Encounter (Signed)
Pt called nurse line about restless leg problems and not sleeping. Called pt on [phone] to schedule an appt for pt but pt did not answer and VM is full. If pt calls, please schedule an appt for pt to be seen. Sunday Spillers, CMA

## 2018-07-31 NOTE — Assessment & Plan Note (Signed)
Will do typical lab tests and start requip.  Both requip and my second line drug, gabapentin, are preg category C.  Patient aware of risk benefit of meds during pregnancy.

## 2018-07-31 NOTE — Patient Instructions (Addendum)
Non-drug treatments. Non-drug treatments are tried first, especially if symptoms are mild. Non-drug treatments include:  Getting regular exercise, such as riding a bike/stationary bike or walking, but avoiding heavy/intense exercise within a few hours of bedtime. Following good sleep habits, including avoiding reading, watching television or being on a computer or phone while lying in bed; getting 7 to 9 hours of sleep and following other healthy sleep habits. Not getting enough sleep can worsen RLS symptoms. Avoiding or limiting caffeinated products (coffees, teas, colas, chocolates, and some medications [check labels]), nicotine, and alcohol. Applying a heating pad, cold compress, or rubbing your legs to provide temporary relief to the leg discomfort. Also consider massage, acupressure, walking, light stretching or other relaxation techniques. Soak in a warm tub. Try magnesium supplements. They may be helpful. Reduce stress as much as possible. Try meditation, yoga, soft music or other options.  I am prescribing the first line treatment for restless leg at low dose.  I will call with lab results on stuff we normally check with restless leg.    These folks did your IUD.  I put in a referral Editor: Ok Edwards, MD (Physician)

## 2018-07-31 NOTE — Assessment & Plan Note (Signed)
Foot exam done.   Will request eye exam.  Management is done by endo, not use.

## 2018-07-31 NOTE — Assessment & Plan Note (Signed)
Placed Gyn referral at patient request.  Needs Pap and discuss IUD removal.

## 2018-07-31 NOTE — Progress Notes (Signed)
Established Patient Office Visit  Subjective:  Patient ID: Sierra Schneider, female    DOB: [birth year]  Age: 33 y.o. MRN: [id number]  CC:  Chief Complaint  Patient presents with  . Restless Leg    not sleeping well    HPI Sierra Schneider presents for Restless leg +++ 1. Patient complains of 6 months of progressive restless leg syndrome symptoms.  We reviewed symptoms and hers sound classic.  She is a type on diabetic and does have some intermitant foot numbness - but the uncontrolable leg movement symptoms are separate.  Never tried meds.  No previous WU. Had worse symptoms during pregnancy.  Now sx are flared again. 2. Type 1 DM.  Needs foot exam.  States she has had an eye exam but we do not have records. 3. Contraception.  Due for pap.  Also considering removal of IUD.  Pregnancy decision is huge given Type 1 DM.  Past Medical History:  Diagnosis Date  . Depression   . Diabetes mellitus 08/2009   late onset type 1    Past Surgical History:  Procedure Laterality Date  . CESAREAN SECTION    . ESOPHAGOGASTRODUODENOSCOPY Left 10/14/2012   Procedure: ESOPHAGOGASTRODUODENOSCOPY (EGD);  Surgeon: Vertell Novak., MD;  Location: [facility] ENDOSCOPY;  Service: Endoscopy;  Laterality: Left;  . WISDOM TOOTH EXTRACTION      Family History  Problem Relation Age of Onset  . Depression Mother   . Hypertension Father   . Hyperlipidemia Father   . Kidney disease Paternal Grandmother   . Diabetes Maternal Grandfather   . ADD / ADHD Brother   . Depression Brother   . Drug abuse Brother   . Depression Maternal Aunt   . Depression Paternal Aunt   . Psychosis Cousin   . Heart disease Neg Hx     Social History   Socioeconomic History  . Marital status: Single    Spouse name: Not on file  . Number of children: Not on file  . Years of education: Not on file  . Highest education level: Not on file  Occupational History  . Not on file  Social Needs  . Financial resource strain: Not  on file  . Food insecurity:    Worry: Not on file    Inability: Not on file  . Transportation needs:    Medical: Not on file    Non-medical: Not on file  Tobacco Use  . Smoking status: Former Smoker    Packs/day: 0.00    Last attempt to quit: 01/26/2014    Years since quitting: 4.5  . Smokeless tobacco: Never Used  Substance and Sexual Activity  . Alcohol use: Yes    Comment: 1 drink every 3 months  . Drug use: No  . Sexual activity: Yes    Birth control/protection: I.U.D.    Comment: mirena  Lifestyle  . Physical activity:    Days per week: Not on file    Minutes per session: Not on file  . Stress: Not on file  Relationships  . Social connections:    Talks on phone: Not on file    Gets together: Not on file    Attends religious service: Not on file    Active member of club or organization: Not on file    Attends meetings of clubs or organizations: Not on file    Relationship status: Not on file  . Intimate partner violence:    Fear of current or ex partner: Not  on file    Emotionally abused: Not on file    Physically abused: Not on file    Forced sexual activity: Not on file  Other Topics Concern  . Not on file  Social History Narrative   Patient goes to school full-time at [employer]. Patient works part-time at [employer]. Is engaged. Having relationship difficulties. Has 2 children-ages 2 and 8.    Outpatient Medications Prior to Visit  Medication Sig Dispense Refill  . amoxicillin-clavulanate (AUGMENTIN) 875-125 MG tablet Take 1 tablet by mouth 2 (two) times daily. 20 tablet 0  . buPROPion (WELLBUTRIN XL) 300 MG 24 hr tablet Take 1 tablet (300 mg total) by mouth daily. 90 tablet 0  . clonazePAM (KLONOPIN) 0.5 MG tablet Take one tablet for extreme anxiety, not to exceed one a day 20 tablet 1  . FIASP 100 UNIT/ML SOLN USE 80 UNITS PER INSULIN PUMP PER DAY. 30 mL 2  . hydrOXYzine (VISTARIL) 50 MG capsule Take 1-2 capsule at bed time 180 capsule 0  .  lamoTRIgine (LAMICTAL) 150 MG tablet Take 2 tablets (300 mg total) by mouth daily. 180 tablet 0  . rosuvastatin (CRESTOR) 20 MG tablet TAKE 1 TABLET BY MOUTH EVERY DAY 90 tablet 1   No facility-administered medications prior to visit.     No Known Allergies  ROS Review of Systems    Objective:    Physical Exam  BP 98/66   Pulse 88   SpO2 98%  Wt Readings from Last 3 Encounters:  02/14/18 141 lb (64 kg)  02/06/18 142 lb 6.4 oz (64.6 kg)  01/31/18 141 lb 3.2 oz (64 kg)   Legs normal.  Diabetic foot exam done.  Health Maintenance Due  Topic Date Due  . FOOT EXAM  03/19/1994  . PAP SMEAR-Modifier  12/31/2013  . OPHTHALMOLOGY EXAM  04/27/2015  . HEMOGLOBIN A1C  07/29/2018    There are no preventive care reminders to display for this patient.  Lab Results  Component Value Date   TSH 1.84 02/21/2017   Lab Results  Component Value Date   WBC 9.4 03/13/2014   HGB 14.2 05/07/2016   HCT 39.5 03/13/2014   MCV 88.8 03/13/2014   PLT 291 03/13/2014   Lab Results  Component Value Date   NA 139 01/27/2018   K 4.2 01/27/2018   CO2 27 01/27/2018   GLUCOSE 202 (H) 01/27/2018   BUN 7 01/27/2018   CREATININE 0.72 01/27/2018   BILITOT 0.4 09/09/2017   ALKPHOS 66 09/09/2017   AST 14 09/09/2017   ALT 11 09/09/2017   PROT 6.8 09/09/2017   ALBUMIN 4.5 09/09/2017   CALCIUM 9.3 01/27/2018   ANIONGAP 4 (L) 03/13/2014   GFR 98.63 01/27/2018   Lab Results  Component Value Date   CHOL 125 09/09/2017   Lab Results  Component Value Date   HDL 50.70 09/09/2017   Lab Results  Component Value Date   LDLCALC 63 09/09/2017   Lab Results  Component Value Date   TRIG 61.0 09/09/2017   Lab Results  Component Value Date   CHOLHDL 2 09/09/2017   Lab Results  Component Value Date   HGBA1C 6.8 (H) 01/27/2018      Assessment & Plan:   Problem List Items Addressed This Visit    Restless leg syndrome   Relevant Medications   rOPINIRole (REQUIP) 0.5 MG tablet   Other  Relevant Orders   Basic metabolic panel   Magnesium   Iron and TIBC  IUD (intrauterine device) in place   Relevant Orders   Ambulatory referral to Gynecology      Meds ordered this encounter  Medications  . rOPINIRole (REQUIP) 0.5 MG tablet    Sig: Take 1 tablet (0.5 mg total) by mouth at bedtime.    Dispense:  30 tablet    Refill:  2    Follow-up: No follow-ups on file.    Moses MannersWilliam A Aldora Perman, MD

## 2018-08-01 ENCOUNTER — Telehealth: Payer: Self-pay | Admitting: *Deleted

## 2018-08-01 LAB — BASIC METABOLIC PANEL
BUN/Creatinine Ratio: 15 (ref 9–23)
BUN: 10 mg/dL (ref 6–20)
CO2: 24 mmol/L (ref 20–29)
Calcium: 9.5 mg/dL (ref 8.7–10.2)
Chloride: 102 mmol/L (ref 96–106)
Creatinine, Ser: 0.66 mg/dL (ref 0.57–1.00)
GFR calc Af Amer: 133 mL/min/{1.73_m2} (ref >59)
GFR calc non Af Amer: 116 mL/min/{1.73_m2} (ref >59)
Glucose: 99 mg/dL (ref 65–99)
Potassium: 4.3 mmol/L (ref 3.5–5.2)
Sodium: 140 mmol/L (ref 134–144)

## 2018-08-01 LAB — IRON AND TIBC
Iron Saturation: 64 % — ABNORMAL HIGH (ref 15–55)
Iron: 139 ug/dL (ref 27–159)
Total Iron Binding Capacity: 217 ug/dL — ABNORMAL LOW (ref 250–450)
UIBC: 78 ug/dL — ABNORMAL LOW (ref 131–425)

## 2018-08-01 LAB — MAGNESIUM: Magnesium: 2.1 mg/dL (ref 1.6–2.3)

## 2018-08-01 NOTE — Telephone Encounter (Signed)
Pt would like to know the results of bloodwork.  Jone Baseman, CMA

## 2018-08-04 NOTE — Telephone Encounter (Signed)
Called and verified that she already had results.  I had called earlier. She has no questions.  The requip is working for her restless legs

## 2018-08-07 ENCOUNTER — Other Ambulatory Visit (HOSPITAL_COMMUNITY): Payer: Self-pay | Admitting: Psychiatry

## 2018-08-07 DIAGNOSIS — F331 Major depressive disorder, recurrent, moderate: Secondary | ICD-10-CM

## 2018-08-11 ENCOUNTER — Ambulatory Visit (INDEPENDENT_AMBULATORY_CARE_PROVIDER_SITE_OTHER): Payer: Medicaid Other | Admitting: Psychiatry

## 2018-08-11 ENCOUNTER — Encounter (HOSPITAL_COMMUNITY): Payer: Self-pay | Admitting: Psychiatry

## 2018-08-11 ENCOUNTER — Other Ambulatory Visit: Payer: Self-pay

## 2018-08-11 DIAGNOSIS — F411 Generalized anxiety disorder: Secondary | ICD-10-CM | POA: Diagnosis not present

## 2018-08-11 DIAGNOSIS — G2581 Restless legs syndrome: Secondary | ICD-10-CM

## 2018-08-11 DIAGNOSIS — F410 Panic disorder [episodic paroxysmal anxiety] without agoraphobia: Secondary | ICD-10-CM | POA: Diagnosis not present

## 2018-08-11 DIAGNOSIS — F331 Major depressive disorder, recurrent, moderate: Secondary | ICD-10-CM

## 2018-08-11 MED ORDER — ROPINIROLE HCL 1 MG PO TABS: 1.0000 mg | ORAL_TABLET | Freq: Every day | ORAL | 2 refills | Status: DC

## 2018-08-11 MED ORDER — BUPROPION HCL ER (XL) 300 MG PO TB24: 300.0000 mg | ORAL_TABLET | Freq: Every day | ORAL | 0 refills | Status: DC

## 2018-08-11 MED ORDER — LAMOTRIGINE 150 MG PO TABS: 300.0000 mg | ORAL_TABLET | Freq: Every day | ORAL | 0 refills | Status: DC

## 2018-08-11 MED ORDER — CLONAZEPAM 0.5 MG PO TABS: ORAL_TABLET | ORAL | 1 refills | Status: DC

## 2018-08-11 NOTE — Progress Notes (Signed)
Virtual Visit via Telephone Note  I connected with Sierra Schneider on 08/11/18 at 11:40 AM EDT by telephone and verified that I am speaking with the correct person using two identifiers.   I discussed the limitations, risks, security and privacy concerns of performing an evaluation and management service by telephone and the availability of in person appointments. I also discussed with the patient that there may be a patient responsible charge related to this service. The patient expressed understanding and agreed to proceed.   History of Present Illness: Patient was evaluated by phone session.  On her last visit we started her on Klonopin because of panic attack.  She was worried about her job and home schooling of her 21 year old son.  She is feeling better with panic attack and she only takes Klonopin as needed.  She takes 2-3 times a week and her panic attacks are less intense and less frequent.  She is not sure what triggered the panic attack but she is pleased that medicine is working.  Lately she also endorsed of restless leg and having poor sleep.  She discussed with her physician who prescribed Requip 0.5 mg.  Patient told it help staying asleep but she still have difficulty falling asleep and she continues to notice still have residual symptoms of restless leg.  She is wondering if the dose can further increase.  Overall she describes her anxiety and mood is much better and she denies any suicidal thoughts or homicidal thought.  She is taking her medication and reported no tremors, shakes or any EPS.  She denies any crying spells.  Her energy level is good.  She is pleased her job is going well and she is busier now.  She denies drinking or using any illegal substances.  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  She is compliant with Wellbutrin and Lamictal.  She has no rash.  Past Psychiatric History:Reviewed. H/O depression after her daughter  born. Took Prozac, BuSpar, Effexor, Zoloft and Cymbalta. No H/O suicidal attempt or any psychiatric inpatient treatment, mania psychosis or any hallucination.    Psychiatric Specialty Exam: Physical Exam  ROS  There were no vitals taken for this visit.There is no height or weight on file to calculate BMI.  General Appearance: NA  Eye Contact:  NA  Speech:  Clear and Coherent  Volume:  Normal  Mood:  Anxious  Affect:  NA  Thought Process:  Goal Directed  Orientation:  Full (Time, Place, and Person)  Thought Content:  Logical  Suicidal Thoughts:  No  Homicidal Thoughts:  No  Memory:  Immediate;   Good Recent;   Good Remote;   Good  Judgement:  Good  Insight:  Good  Psychomotor Activity:  Normal  Concentration:  Concentration: Good and Attention Span: Good  Recall:  Good  Fund of Knowledge:  Good  Language:  Good  Akathisia:  No  Handed:  Right  AIMS (if indicated):     Assets:  Communication Skills Desire for Improvement Housing Resilience Social Support Talents/Skills  ADL's:  Intact  Cognition:  WNL  Sleep:   fair      Assessment and Plan: Major depressive disorder, recurrent.  Generalized anxiety disorder.  Panic attacks.  Restless leg syndrome.  I discussed to try higher dose of Requip since 0.5 mg is very low dose.  She agreed to give a higher dose trial.  I recommend to try Requip 1 mg and continue Wellbutrin XL  300 mg daily and Lamictal 300 mg daily.  She is no longer taking hydroxyzine and I will hold the hydroxyzine as patient was taking for insomnia.  I suggested if increase Requip helped the sleep then she may not need to hydroxyzine.  She will continue Klonopin 0.5 mg as needed for panic attack.  Recommended to call us back if she is any question or any concern.  Follow-up in 3 months.  Follow Up Instructions:    I discussed the assessment and treatment plan with the patient. The patient was provided an opportunity to ask questions and all were answered.  The patient agreed with the plan and demonstrated an understanding of the instructions.   The patient was advised to call back or seek an in-person evaluation if the symptoms worsen or if the condition fails to improve as anticipated.  I provided 20 minutes of non-face-to-face time during this encounter.   Cleotis NipperSyed T Lanell Carpenter, MD

## 2018-08-19 ENCOUNTER — Telehealth: Payer: Self-pay

## 2018-08-19 NOTE — Telephone Encounter (Signed)
Received fax from patient's pharmacy stating that her Sierra Schneider needs a PA in order to get approval. Would you like to complete the PA or change the medication? If you would like to do the PA, what is the medical reason the patient cannot use the alternative Novolog?

## 2018-08-19 NOTE — Telephone Encounter (Signed)
She can have NovoLog for now, 10 mL only, until she makes an appointment for follow-up which is overdue

## 2018-08-19 NOTE — Telephone Encounter (Signed)
Called pt to make her aware. Declined Novolog. States she has plenty of Fiasp (4 vials) and will remain on Fiasp for now. Scheduled both lab and f/u appts as ordered.

## 2018-08-26 ENCOUNTER — Other Ambulatory Visit: Payer: Self-pay

## 2018-08-28 NOTE — Telephone Encounter (Signed)
Called [employer] and initiated PA for [employer].  PA was approved from 08/28/2018 until 08/23/2019. Approval number is [id number] Call reference #: [id number]

## 2018-09-10 ENCOUNTER — Other Ambulatory Visit (INDEPENDENT_AMBULATORY_CARE_PROVIDER_SITE_OTHER): Payer: Medicaid Other

## 2018-09-10 ENCOUNTER — Other Ambulatory Visit: Payer: Self-pay

## 2018-09-10 DIAGNOSIS — E1065 Type 1 diabetes mellitus with hyperglycemia: Secondary | ICD-10-CM | POA: Diagnosis not present

## 2018-09-10 DIAGNOSIS — E7800 Pure hypercholesterolemia, unspecified: Secondary | ICD-10-CM | POA: Diagnosis not present

## 2018-09-10 LAB — LIPID PANEL
Cholesterol: 138 mg/dL (ref 0–200)
HDL: 52.8 mg/dL (ref >39.00)
LDL Cholesterol: 69 mg/dL (ref 0–99)
NonHDL: 84.83
Total CHOL/HDL Ratio: 3
Triglycerides: 81 mg/dL (ref 0.0–149.0)
VLDL: 16.2 mg/dL (ref 0.0–40.0)

## 2018-09-10 LAB — COMPREHENSIVE METABOLIC PANEL
ALT: 23 U/L (ref 0–35)
AST: 22 U/L (ref 0–37)
Albumin: 4.4 g/dL (ref 3.5–5.2)
Alkaline Phosphatase: 82 U/L (ref 39–117)
BUN: 9 mg/dL (ref 6–23)
CO2: 29 mEq/L (ref 19–32)
Calcium: 8.9 mg/dL (ref 8.4–10.5)
Chloride: 105 mEq/L (ref 96–112)
Creatinine, Ser: 0.73 mg/dL (ref 0.40–1.20)
GFR: 91 mL/min (ref >60.00)
Glucose, Bld: 141 mg/dL — ABNORMAL HIGH (ref 70–99)
Potassium: 4.2 mEq/L (ref 3.5–5.1)
Sodium: 140 mEq/L (ref 135–145)
Total Bilirubin: 0.5 mg/dL (ref 0.2–1.2)
Total Protein: 6.5 g/dL (ref 6.0–8.3)

## 2018-09-10 LAB — HEMOGLOBIN A1C: Hgb A1c MFr Bld: 7 % — ABNORMAL HIGH (ref 4.6–6.5)

## 2018-09-16 NOTE — Telephone Encounter (Signed)
Pt returned call she did need help with the uploading of the pump so I sent her to [place] [facility] and let her know that I would inform noah that she did call back regarding tomorrows appt and that he will have to call her back tomorrow

## 2018-09-17 ENCOUNTER — Encounter: Payer: Self-pay | Admitting: Endocrinology

## 2018-09-17 ENCOUNTER — Ambulatory Visit (INDEPENDENT_AMBULATORY_CARE_PROVIDER_SITE_OTHER): Payer: Medicaid Other | Admitting: Endocrinology

## 2018-09-17 ENCOUNTER — Other Ambulatory Visit: Payer: Self-pay

## 2018-09-17 VITALS — BP 116/78 | HR 99 | Ht 64.0 in | Wt 145.6 lb

## 2018-09-17 DIAGNOSIS — E1065 Type 1 diabetes mellitus with hyperglycemia: Secondary | ICD-10-CM

## 2018-09-17 DIAGNOSIS — E7800 Pure hypercholesterolemia, unspecified: Secondary | ICD-10-CM | POA: Diagnosis not present

## 2018-09-17 NOTE — Progress Notes (Signed)
Patient ID: RASHAUNA TEP, female   DOB: 1986/02/10, 33 y.o.   MRN: [id number]   Reason for Appointment: Diabetes followup:   History of Present Illness   Diagnosis: Type 1 DIABETES MELITUS     DIABETES history: She has had diabetes since about 2011 and has been on insulin pump for several years She had fairly good control in the first 2 years of her disease but blood sugar has been more difficult subsequently  CURRENT insulin pump:  Medtronic 670 G  SETTINGS are:    Basal rate on current download = Midnight = 1.3, 7 AM = 1.4, 10 AM = 0.6, 12 noon = 1.3, 6 PM = 1.05 and 7 PM = 0.9, 10 PM = 1.0  Total basal insulin = about 24 units Carb Ratio: 1:9.  1: 14 at 5 PM Correction factor 1: 30, target 100 Active insulin time 3 hours  Her A1c has been about the same around 7% and now 7.0, previously now down to 6.8   Current management, blood sugar patterns and problems identified:  Pump data: She has been the AUTO mode for 84%, previously 77 % the time in the last 2 weeks Reasons for exiting auto mode are not using sensor, high sugar  and no calibration   CONTINUOUS GLUCOSE MONITORING RECORD INTERPRETATION    Dates of Recording: 7/9 through 7/22  Sensor description: Guardian  Results statistics:   CGM use % of time  81 1  Average and SD  143 +/-46  Time in range      79 %, was 71  % Time Above 180  15  % Time above 250  3  % Time Below target  3    Glycemic patterns summary: Blood sugars are very stable overnight and are trending higher gradually till midday and better in the afternoon but overall she will increase between 4-7 PM with only mild increase after 8 PM Afternoon readings around 4-8 PM showed almost variability  Hyperglycemic episodes are occurring primarily in the mid afternoon but less prominently since 7/18  Hypoglycemic episodes have been transient and mostly related to boluses  Overnight periods: Blood sugars are very stable and gradually  declining after being on the high side of the range at midnight and no hypoglycemia  Preprandial periods: Blood sugars are very stable before breakfast, somewhat variable before lunch but not consistently high Blood sugars are usually higher at dinnertime but significant variability  Postprandial periods:   After breakfast:   Blood sugars are relatively stable with only occasional tendency to higher or lower readings and on an average flat at 3 hours  After lunch:   Glucose readings on average are fairly flat but show moderate variability include a low normal readings postprandially  After dinner: Glucose readings which are higher before eating are usually improved with modest variability and minimal increase at 3-hour mark   PRE-MEAL Fasting Lunch Dinner Bedtime Overall  Glucose range:           Mean/median: 146 146 172 180  143   POST-MEAL PC Breakfast PC Lunch PC Dinner  Glucose range:     Mean/median:  152  155  148   PREVIOUS data:   CGM use % of time 73  Average and SD  150+/-50  Time in range  71 %, was 76  % Time Above 180  22  % Time above 250  4  % Time Below target  3  BLOOD SUGAR PATTERNS, management and problems:   HYPERGLYCEMIA is mostly due to snacking in the afternoon although not in the last 4 days  Otherwise may be occasionally related to the type of meals and not taking enough bolus  Occasionally may be getting hyperglycemic foods causing initial rise in blood sugar  Tendency to low sugars may occur occasionally postprandially but this may be occasionally related to being active after the meal including at night with either walking or swimming  Also occasionally may not eat as much tissue plan and this will cause low sugars  She does not use a temporary basal for increased activity level and blood sugars may be lower even if she has moderate activity in hot weather  Blood sugar data not available since yesterday morning as she has not put on  her sensor    Wt Readings from Last 3 Encounters:  09/17/18 145 lb 9.6 oz (66 kg)  02/14/18 141 lb (64 kg)  02/06/18 142 lb 6.4 oz (64.6 kg)     LABS:  Lab Results  Component Value Date   HGBA1C 7.0 (H) 09/10/2018   HGBA1C 6.8 (H) 01/27/2018   HGBA1C 7.2 (H) 09/09/2017   Lab Results  Component Value Date   MICROALBUR 1.2 01/31/2018   LDLCALC 69 09/10/2018   CREATININE 0.73 09/10/2018    OTHER active problems discussed today: See review of systems    Allergies as of 09/17/2018   No Known Allergies     Medication List       Accurate as of September 17, 2018  3:55 PM. If you have any questions, ask your nurse or doctor.        buPROPion 300 MG 24 hr tablet Commonly known as: WELLBUTRIN XL Take 1 tablet (300 mg total) by mouth daily.   clonazePAM 0.5 MG tablet Commonly known as: KlonoPIN Take one tablet for extreme anxiety, not to exceed one a day   Fiasp 100 UNIT/ML Soln Generic drug: Insulin Aspart (w/Niacinamide) USE 80 UNITS PER INSULIN PUMP PER DAY.   hydrOXYzine 50 MG capsule Commonly known as: VISTARIL Take 1-2 capsule at bed time   lamoTRIgine 150 MG tablet Commonly known as: LAMICTAL Take 2 tablets (300 mg total) by mouth daily.   rOPINIRole 1 MG tablet Commonly known as: REQUIP Take 1 tablet (1 mg total) by mouth at bedtime.   rosuvastatin 20 MG tablet Commonly known as: CRESTOR TAKE 1 TABLET BY MOUTH EVERY DAY       Allergies: No Known Allergies  Past Medical History:  Diagnosis Date  . Depression   . Diabetes mellitus 08/2009   late onset type 1    Past Surgical History:  Procedure Laterality Date  . CESAREAN SECTION    . ESOPHAGOGASTRODUODENOSCOPY Left 10/14/2012   Procedure: ESOPHAGOGASTRODUODENOSCOPY (EGD);  Surgeon: Vertell NovakJames L Edwards Jr., MD;  [facility];  Service: Endoscopy;  Laterality: Left;  . WISDOM TOOTH EXTRACTION      Family History  Problem Relation Age of Onset  . Depression Mother   . Hypertension  Father   . Hyperlipidemia Father   . Kidney disease Paternal Grandmother   . Diabetes Maternal Grandfather   . ADD / ADHD Brother   . Depression Brother   . Drug abuse Brother   . Depression Maternal Aunt   . Depression Paternal Aunt   . Psychosis Cousin   . Heart disease Neg Hx     Social History:  reports that she quit smoking about 4 years  ago. She smoked 0.00 packs per day. She has never used smokeless tobacco. She reports current alcohol use. She reports that she does not use drugs.  REVIEW of systems:   She has had  HYPERCHOLESTEROLEMIA,  Because of her family history of hypercholesterolemia and her diabetes she was previously on pravastatin, now on Crestor 20 mg with baseline of LDL of 146  LDL well controlled as follows and appears stable:  Lab Results  Component Value Date   CHOL 138 09/10/2018   CHOL 125 09/09/2017   CHOL 140 02/21/2017   Lab Results  Component Value Date   HDL 52.80 09/10/2018   HDL 50.70 09/09/2017   HDL 49.70 02/21/2017   Lab Results  Component Value Date   LDLCALC 69 09/10/2018   LDLCALC 63 09/09/2017   LDLCALC 79 02/21/2017   Lab Results  Component Value Date   TRIG 81.0 09/10/2018   TRIG 61.0 09/09/2017   TRIG 59.0 02/21/2017   Lab Results  Component Value Date   CHOLHDL 3 09/10/2018   CHOLHDL 2 09/09/2017   CHOLHDL 3 02/21/2017   Lab Results  Component Value Date   LDLDIRECT 129.4 12/29/2012       EXAM:  BP 116/78 (BP Location: Left Arm, Patient Position: Sitting, Cuff Size: Normal)   Pulse 99   Ht 5\' 4"  (1.626 m)   Wt 145 lb 9.6 oz (66 kg)   SpO2 97%   BMI 24.99 kg/m    ASSESSMENT:    Type I diabetes on Medtronic 670 pump  See history of present illness for detailed discussion of  current management, blood sugar patterns and problems identified   A1c has now stayed down below 7%   Overall blood sugars are relatively stable but she admits to not being consistent with her diet causing some periods of  hypoglycemia  Reviewed her day-to-day management, how she would do boluses, count carbohydrates, adjust pump settings for activities and causes of hyperglycemia and hypoglycemia were identified from her history She does tend to have more physical activity at times which will cause low sugars and she is not taking proper precautions with her pump, this was discussed in detail  Recommendations:  She will try to adjust her boluses based on her activity level better Use temporary target of 150 when planning to be even modestly active May bolus a second time if eating more than anticipated on higher fat meals Also if not eating as much this plan she can drink a regular soft drink to make a further deficit and carbohydrates Try to use a sensor consistently Avoid excessive snacking To call if having consistent problems with hyperglycemia or hypoglycemia  LIPIDS: Well controlled and will continue Crestor  There are no Patient Instructions on file for this visit.  Counseling time on subjects discussed in assessment and plan sections is over 50% of today's 25 minute visit    Reather LittlerAjay Tinzley Dalia 09/17/2018, 3:55 PM

## 2018-10-02 ENCOUNTER — Telehealth: Payer: Self-pay

## 2018-10-02 ENCOUNTER — Other Ambulatory Visit (HOSPITAL_COMMUNITY): Payer: Self-pay | Admitting: Psychiatry

## 2018-10-02 DIAGNOSIS — F331 Major depressive disorder, recurrent, moderate: Secondary | ICD-10-CM

## 2018-10-02 NOTE — Telephone Encounter (Signed)
Sent mychart message to pt with this information.

## 2018-10-02 NOTE — Telephone Encounter (Signed)
The only thing I can find is the Insulin antibody test. Is this the same or appropriate substitute?

## 2018-10-02 NOTE — Telephone Encounter (Signed)
[employer] is requesting a copy of pt's C-Peptide or ICA test.  I cannot find either of these under labs or media tab. Could you please assist?

## 2018-10-02 NOTE — Telephone Encounter (Signed)
She will have to come in for the test with fasting glucose.  She has usually been on [employer] and not Medicaid and has never needed testing

## 2018-10-03 ENCOUNTER — Other Ambulatory Visit: Payer: Self-pay | Admitting: Endocrinology

## 2018-10-03 ENCOUNTER — Other Ambulatory Visit: Payer: Self-pay

## 2018-10-03 ENCOUNTER — Other Ambulatory Visit (INDEPENDENT_AMBULATORY_CARE_PROVIDER_SITE_OTHER): Payer: Medicaid Other

## 2018-10-03 DIAGNOSIS — E1065 Type 1 diabetes mellitus with hyperglycemia: Secondary | ICD-10-CM

## 2018-10-03 LAB — GLUCOSE, RANDOM: Glucose, Bld: 98 mg/dL (ref 70–99)

## 2018-10-04 LAB — C-PEPTIDE: C-Peptide: <0.1 ng/mL — ABNORMAL LOW (ref 1.1–4.4)

## 2018-10-06 DIAGNOSIS — Z315 Encounter for genetic counseling: Secondary | ICD-10-CM | POA: Diagnosis not present

## 2018-10-06 DIAGNOSIS — Z820 Family history of epilepsy and other diseases of the nervous system: Secondary | ICD-10-CM | POA: Diagnosis not present

## 2018-10-09 ENCOUNTER — Telehealth: Payer: Self-pay | Admitting: Endocrinology

## 2018-10-09 NOTE — Telephone Encounter (Signed)
[place] has called in regards to needing paperwork for this patient.  Ph # [phone] Fax # [phone]  Please Advise, Thanks

## 2018-10-09 NOTE — Telephone Encounter (Signed)
This paperwork was faxed today and received a fax confirmation.

## 2018-10-13 ENCOUNTER — Other Ambulatory Visit (HOSPITAL_COMMUNITY): Payer: Self-pay | Admitting: Psychiatry

## 2018-10-13 DIAGNOSIS — F331 Major depressive disorder, recurrent, moderate: Secondary | ICD-10-CM

## 2018-10-13 DIAGNOSIS — G2581 Restless legs syndrome: Secondary | ICD-10-CM

## 2018-10-17 ENCOUNTER — Other Ambulatory Visit (HOSPITAL_COMMUNITY): Payer: Self-pay | Admitting: Psychiatry

## 2018-10-17 DIAGNOSIS — F410 Panic disorder [episodic paroxysmal anxiety] without agoraphobia: Secondary | ICD-10-CM

## 2018-10-20 ENCOUNTER — Telehealth: Payer: Self-pay | Admitting: Endocrinology

## 2018-10-20 NOTE — Telephone Encounter (Signed)
Paperwork is completed and will be faxed today.

## 2018-10-20 NOTE — Telephone Encounter (Signed)
[place] has called in regards to faxing [place] on 10/15/18 about a prior auth for the sensor order.  Please Advise, Thanks

## 2018-10-21 ENCOUNTER — Other Ambulatory Visit (HOSPITAL_COMMUNITY): Payer: Self-pay

## 2018-10-21 DIAGNOSIS — F410 Panic disorder [episodic paroxysmal anxiety] without agoraphobia: Secondary | ICD-10-CM

## 2018-10-21 MED ORDER — CLONAZEPAM 0.5 MG PO TABS: ORAL_TABLET | ORAL | 0 refills | Status: DC

## 2018-11-05 ENCOUNTER — Other Ambulatory Visit: Payer: Self-pay | Admitting: Endocrinology

## 2018-11-05 DIAGNOSIS — E1065 Type 1 diabetes mellitus with hyperglycemia: Secondary | ICD-10-CM | POA: Diagnosis not present

## 2018-11-11 ENCOUNTER — Ambulatory Visit (INDEPENDENT_AMBULATORY_CARE_PROVIDER_SITE_OTHER): Payer: Medicaid Other | Admitting: Psychiatry

## 2018-11-11 ENCOUNTER — Encounter (HOSPITAL_COMMUNITY): Payer: Self-pay | Admitting: Psychiatry

## 2018-11-11 ENCOUNTER — Other Ambulatory Visit: Payer: Self-pay

## 2018-11-11 DIAGNOSIS — G2581 Restless legs syndrome: Secondary | ICD-10-CM

## 2018-11-11 DIAGNOSIS — F331 Major depressive disorder, recurrent, moderate: Secondary | ICD-10-CM

## 2018-11-11 DIAGNOSIS — F411 Generalized anxiety disorder: Secondary | ICD-10-CM | POA: Diagnosis not present

## 2018-11-11 DIAGNOSIS — F410 Panic disorder [episodic paroxysmal anxiety] without agoraphobia: Secondary | ICD-10-CM

## 2018-11-11 MED ORDER — ROPINIROLE HCL 1 MG PO TABS: 1.0000 mg | ORAL_TABLET | Freq: Every day | ORAL | 0 refills | Status: DC

## 2018-11-11 MED ORDER — LAMOTRIGINE 150 MG PO TABS: 300.0000 mg | ORAL_TABLET | Freq: Every day | ORAL | 0 refills | Status: DC

## 2018-11-11 MED ORDER — BUPROPION HCL ER (XL) 300 MG PO TB24: 300.0000 mg | ORAL_TABLET | Freq: Every day | ORAL | 0 refills | Status: DC

## 2018-11-11 NOTE — Progress Notes (Signed)
Virtual Visit via Telephone Note  I connected with Sierra Schneider on 11/11/18 at 10:40 AM EDT by telephone and verified that I am speaking with the correct person using two identifiers.   I discussed the limitations, risks, security and privacy concerns of performing an evaluation and management service by telephone and the availability of in person appointments. I also discussed with the patient that there may be a patient responsible charge related to this service. The patient expressed understanding and agreed to proceed.   History of Present Illness: Patient was evaluated by phone session.  On her last visit we increase her Requip and she is taking 1 mg which is helping her restless leg and sleep.  She.  Much relaxed and denies any recent panic attack in 2 weeks.  She did not require hydroxyzine and Klonopin.  Overall she feels her depression and anxiety is under control.  Her job is going very well.  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  She denies any irritability, crying spells, feeling of hopelessness or worthlessness.  She has no tremors shakes or any rash.  Her energy level is good.  Her appetite is okay.  Recently she had a blood work.  Her hemoglobin A1c is 7.0.     Past Psychiatric History:Reviewed. H/Odepressionafterher daughter born. TookProzac, BuSpar, Effexor, Zoloft and Cymbalta.No H/Osuicidal attempt or any psychiatric inpatient treatment,mania psychosis or any hallucination.  Recent Results (from the past 2160 hour(s))  Lipid panel     Status: None   Collection Time: 09/10/18  8:29 AM  Result Value Ref Range   Cholesterol 138 0 - 200 mg/dL    Comment: ATP III Classification       Desirable:  < 200 mg/dL               Borderline High:  200 - 239 mg/dL          High:  > = 240 mg/dL   Triglycerides 81.0 0.0 - 149.0 mg/dL    Comment: Normal:  <150 mg/dLBorderline High:  150 - 199 mg/dL   HDL 52.80  >39.00 mg/dL   VLDL 16.2 0.0 - 40.0 mg/dL   LDL Cholesterol 69 0 - 99 mg/dL   Total CHOL/HDL Ratio 3     Comment:                Men          Women1/2 Average Risk     3.4          3.3Average Risk          5.0          4.42X Average Risk          9.6          7.13X Average Risk          15.0          11.0                       NonHDL 84.83     Comment: NOTE:  Non-HDL goal should be 30 mg/dL higher than patient's LDL goal (i.e. LDL goal of < 70 mg/dL, would have non-HDL goal of < 100 mg/dL)  Comprehensive metabolic panel     Status: Abnormal   Collection Time: 09/10/18  8:29 AM  Result Value Ref Range   Sodium 140 135 - 145 mEq/L   Potassium  4.2 3.5 - 5.1 mEq/L   Chloride 105 96 - 112 mEq/L   CO2 29 19 - 32 mEq/L   Glucose, Bld 141 (H) 70 - 99 mg/dL   BUN 9 6 - 23 mg/dL   Creatinine, Ser 1.610.73 0.40 - 1.20 mg/dL   Total Bilirubin 0.5 0.2 - 1.2 mg/dL   Alkaline Phosphatase 82 39 - 117 U/L   AST 22 0 - 37 U/L   ALT 23 0 - 35 U/L   Total Protein 6.5 6.0 - 8.3 g/dL   Albumin 4.4 3.5 - 5.2 g/dL   Calcium 8.9 8.4 - 09.610.5 mg/dL   GFR 04.5491.00 >09.81>60.00 mL/min  Hemoglobin A1c     Status: Abnormal   Collection Time: 09/10/18  8:29 AM  Result Value Ref Range   Hgb A1c MFr Bld 7.0 (H) 4.6 - 6.5 %    Comment: Glycemic Control Guidelines for People with Diabetes:Non Diabetic:  <6%Goal of Therapy: <7%Additional Action Suggested:  >8%   Glucose, random     Status: None   Collection Time: 10/03/18  8:21 AM  Result Value Ref Range   Glucose, Bld 98 70 - 99 mg/dL  C-peptide     Status: Abnormal   Collection Time: 10/03/18 10:00 AM  Result Value Ref Range   C-Peptide <0.1 (L) 1.1 - 4.4 ng/mL    Comment: C-Peptide reference interval is for fasting patients.      Psychiatric Specialty Exam: Physical Exam  ROS  There were no vitals taken for this visit.There is no height or weight on file to calculate BMI.  General Appearance: NA  Eye Contact:  NA  Speech:  Clear and Coherent and Normal Rate   Volume:  Normal  Mood:  Euthymic  Affect:  NA  Thought Process:  Goal Directed  Orientation:  Full (Time, Place, and Person)  Thought Content:  WDL and Logical  Suicidal Thoughts:  No  Homicidal Thoughts:  No  Memory:  Immediate;   Good Recent;   Good Remote;   Good  Judgement:  Good  Insight:  Good  Psychomotor Activity:  NA  Concentration:  Concentration: Good and Attention Span: Good  Recall:  Good  Fund of Knowledge:  Good  Language:  Good  Akathisia:  No  Handed:  Right  AIMS (if indicated):     Assets:  Communication Skills Desire for Improvement Housing Resilience Social Support Talents/Skills  ADL's:  Intact  Cognition:  WNL  Sleep:         Assessment and Plan: Major depressive disorder, recurrent.  Generalized anxiety disorder.  Restless leg syndrome.  Panic attacks.  Patient is a stable on her current medication.  She like to increase Requip 1 mg because helping her restless leg.  She like to continue Wellbutrin XL 300 mg daily, Requip 1 mg at bedtime and Lamictal 300 mg daily.  She does not require Klonopin and hydroxyzine at this time since she does not have any anxiety or panic attack.  Discussed medication side effects and benefits.  Recommended to call us back if she has any question or any concern.  Follow-up in 3 months.  Follow Up Instructions:    I discussed the assessment and treatment plan with the patient. The patient was provided an opportunity to ask questions and all were answered. The patient agreed with the plan and demonstrated an understanding of the instructions.   The patient was advised to call back or seek an in-person evaluation if the symptoms worsen or if  the condition fails to improve as anticipated.  I provided 20 minutes of non-face-to-face time during this encounter.   Kathlee Nations, MD

## 2018-11-21 ENCOUNTER — Encounter: Payer: Self-pay | Admitting: Endocrinology

## 2018-11-24 ENCOUNTER — Telehealth: Payer: Self-pay

## 2018-11-24 NOTE — Telephone Encounter (Signed)
I had sent her the settings in the letter format through my chart on Saturday as follows: Not sure why she did not open her my chart note  Basal rates :  Midnight = 1.3, 7 AM = 1.4, 10 AM = 0.6, 12 noon = 1.3, 6 PM = 1.05 ,7 PM = 0.9,  10 PM = 1.0    Carb Ratio: 1:9 from 12 am to 5pm;.  at 5 PM= 1: 14  Correction factor/sensitivity 1: 30, target 120  Active insulin time 3 hours

## 2018-11-24 NOTE — Telephone Encounter (Signed)
Noted  

## 2018-11-24 NOTE — Telephone Encounter (Signed)
Pt called after hours service and stated that she got a new insulin pump  Because her old one stopped working, and she needs to know her basal rates in order to reprogram her insulin pump. Based off her last office visit note, her basal rates and pump info is copied below.    Basal rate on current download = Midnight = 1.3, 7 AM = 1.4, 10 AM = 0.6, 12 noon = 1.3, 6 PM = 1.05 and 7 PM = 0.9, 10 PM = 1.0  Total basal insulin = about 24 units Carb Ratio: 1:9.  1: 14 at 5 PM Correction factor 1: 30, target 100 Active insulin time 3 hours   Dr. Dwyane Dee, will you please review and confirm or make corrections as needed and I will send this to the pt in a MyChart message.

## 2018-12-03 ENCOUNTER — Other Ambulatory Visit (HOSPITAL_COMMUNITY): Payer: Self-pay

## 2018-12-03 DIAGNOSIS — F410 Panic disorder [episodic paroxysmal anxiety] without agoraphobia: Secondary | ICD-10-CM

## 2018-12-03 MED ORDER — CLONAZEPAM 0.5 MG PO TABS: ORAL_TABLET | ORAL | 0 refills | Status: DC

## 2018-12-24 ENCOUNTER — Other Ambulatory Visit (HOSPITAL_COMMUNITY): Payer: Self-pay

## 2018-12-24 DIAGNOSIS — F410 Panic disorder [episodic paroxysmal anxiety] without agoraphobia: Secondary | ICD-10-CM

## 2018-12-24 MED ORDER — CLONAZEPAM 0.5 MG PO TABS: ORAL_TABLET | ORAL | 0 refills | Status: DC

## 2018-12-25 DIAGNOSIS — E1065 Type 1 diabetes mellitus with hyperglycemia: Secondary | ICD-10-CM | POA: Diagnosis not present

## 2019-01-11 ENCOUNTER — Other Ambulatory Visit: Payer: Self-pay | Admitting: Endocrinology

## 2019-01-11 DIAGNOSIS — E1065 Type 1 diabetes mellitus with hyperglycemia: Secondary | ICD-10-CM

## 2019-01-11 DIAGNOSIS — E7800 Pure hypercholesterolemia, unspecified: Secondary | ICD-10-CM

## 2019-01-12 ENCOUNTER — Other Ambulatory Visit: Payer: Self-pay

## 2019-01-12 ENCOUNTER — Other Ambulatory Visit (INDEPENDENT_AMBULATORY_CARE_PROVIDER_SITE_OTHER): Payer: Medicaid Other

## 2019-01-12 DIAGNOSIS — E1065 Type 1 diabetes mellitus with hyperglycemia: Secondary | ICD-10-CM | POA: Diagnosis not present

## 2019-01-12 DIAGNOSIS — E7800 Pure hypercholesterolemia, unspecified: Secondary | ICD-10-CM | POA: Diagnosis not present

## 2019-01-12 LAB — COMPREHENSIVE METABOLIC PANEL
ALT: 17 U/L (ref 0–35)
AST: 19 U/L (ref 0–37)
Albumin: 4 g/dL (ref 3.5–5.2)
Alkaline Phosphatase: 69 U/L (ref 39–117)
BUN: 6 mg/dL (ref 6–23)
CO2: 28 mEq/L (ref 19–32)
Calcium: 8.7 mg/dL (ref 8.4–10.5)
Chloride: 102 mEq/L (ref 96–112)
Creatinine, Ser: 0.69 mg/dL (ref 0.40–1.20)
GFR: 96.92 mL/min (ref >60.00)
Glucose, Bld: 275 mg/dL — ABNORMAL HIGH (ref 70–99)
Potassium: 3.6 mEq/L (ref 3.5–5.1)
Sodium: 138 mEq/L (ref 135–145)
Total Bilirubin: 0.6 mg/dL (ref 0.2–1.2)
Total Protein: 6.2 g/dL (ref 6.0–8.3)

## 2019-01-12 LAB — URINALYSIS, ROUTINE W REFLEX MICROSCOPIC
Bilirubin Urine: NEGATIVE
Hgb urine dipstick: NEGATIVE
Leukocytes,Ua: NEGATIVE
Nitrite: NEGATIVE
RBC / HPF: NONE SEEN (ref 0–?)
Specific Gravity, Urine: >=1.03 — AB (ref 1.000–1.030)
Total Protein, Urine: NEGATIVE
Urine Glucose: 500 — AB
Urobilinogen, UA: 0.2 (ref 0.0–1.0)
pH: 5.5 (ref 5.0–8.0)

## 2019-01-12 LAB — MICROALBUMIN / CREATININE URINE RATIO
Creatinine,U: 249.2 mg/dL
Microalb Creat Ratio: 1 mg/g (ref 0.0–30.0)
Microalb, Ur: 2.6 mg/dL — ABNORMAL HIGH (ref 0.0–1.9)

## 2019-01-12 LAB — TSH: TSH: 0.97 u[IU]/mL (ref 0.35–4.50)

## 2019-01-12 LAB — HEMOGLOBIN A1C: Hgb A1c MFr Bld: 7.9 % — ABNORMAL HIGH (ref 4.6–6.5)

## 2019-01-14 ENCOUNTER — Ambulatory Visit: Payer: Medicaid Other | Admitting: Endocrinology

## 2019-01-23 DIAGNOSIS — E1065 Type 1 diabetes mellitus with hyperglycemia: Secondary | ICD-10-CM | POA: Diagnosis not present

## 2019-01-29 ENCOUNTER — Telehealth (HOSPITAL_COMMUNITY): Payer: Self-pay | Admitting: *Deleted

## 2019-01-29 ENCOUNTER — Other Ambulatory Visit (HOSPITAL_COMMUNITY): Payer: Self-pay | Admitting: *Deleted

## 2019-01-29 DIAGNOSIS — F410 Panic disorder [episodic paroxysmal anxiety] without agoraphobia: Secondary | ICD-10-CM

## 2019-01-29 MED ORDER — CLONAZEPAM 0.5 MG PO TABS: ORAL_TABLET | ORAL | 0 refills | Status: DC

## 2019-01-29 NOTE — Telephone Encounter (Signed)
Message left on pt voicemail regarding refill status on Klonopin and need to make an appointment to see MD for future refills.

## 2019-01-29 NOTE — Telephone Encounter (Signed)
Pt left message requesting a refill of Klonopin 0.5mg . last refilled 12/24/18. No future appointments.

## 2019-01-29 NOTE — Telephone Encounter (Signed)
Please call in 15 tablets of Klonopin 0.5 mg.  She need appointment for future refills.

## 2019-01-30 ENCOUNTER — Other Ambulatory Visit: Payer: Self-pay | Admitting: Endocrinology

## 2019-02-02 ENCOUNTER — Telehealth (HOSPITAL_COMMUNITY): Payer: Self-pay

## 2019-02-02 DIAGNOSIS — F410 Panic disorder [episodic paroxysmal anxiety] without agoraphobia: Secondary | ICD-10-CM

## 2019-02-02 DIAGNOSIS — F331 Major depressive disorder, recurrent, moderate: Secondary | ICD-10-CM

## 2019-02-02 MED ORDER — HYDROXYZINE PAMOATE 50 MG PO CAPS: ORAL_CAPSULE | ORAL | 0 refills | Status: DC

## 2019-02-02 NOTE — Telephone Encounter (Signed)
I called a 30 days supply to her [facility] pharmacy.

## 2019-02-02 NOTE — Telephone Encounter (Signed)
Medication refill - Fax received from pt's [place] for a refill of her prescribed Hydroxyzine.  Patient is scheduled for 02/10/19.

## 2019-02-04 ENCOUNTER — Other Ambulatory Visit: Payer: Self-pay | Admitting: Endocrinology

## 2019-02-10 ENCOUNTER — Other Ambulatory Visit: Payer: Self-pay

## 2019-02-10 ENCOUNTER — Ambulatory Visit (HOSPITAL_COMMUNITY): Payer: Medicaid Other | Admitting: Psychiatry

## 2019-02-11 ENCOUNTER — Other Ambulatory Visit (HOSPITAL_COMMUNITY): Payer: Self-pay | Admitting: Psychiatry

## 2019-02-11 DIAGNOSIS — F410 Panic disorder [episodic paroxysmal anxiety] without agoraphobia: Secondary | ICD-10-CM

## 2019-02-11 DIAGNOSIS — F331 Major depressive disorder, recurrent, moderate: Secondary | ICD-10-CM

## 2019-02-12 ENCOUNTER — Telehealth: Payer: Self-pay | Admitting: Endocrinology

## 2019-02-12 DIAGNOSIS — Z20828 Contact with and (suspected) exposure to other viral communicable diseases: Secondary | ICD-10-CM | POA: Diagnosis not present

## 2019-02-12 NOTE — Telephone Encounter (Signed)
Lab visit needs to be canceled as there is not a 3 months interval for her A1c

## 2019-02-12 NOTE — Telephone Encounter (Signed)
Do you want her to repeat CMP, UA, Microalbumin, and TSH?

## 2019-02-12 NOTE — Telephone Encounter (Signed)
Patient needs new lab order for appt on 03/13/19

## 2019-02-12 NOTE — Telephone Encounter (Signed)
No labs

## 2019-02-12 NOTE — Telephone Encounter (Signed)
Canceled lab appt and notified pt via mychart message.

## 2019-02-15 ENCOUNTER — Other Ambulatory Visit (HOSPITAL_COMMUNITY): Payer: Self-pay | Admitting: Psychiatry

## 2019-02-15 DIAGNOSIS — F331 Major depressive disorder, recurrent, moderate: Secondary | ICD-10-CM

## 2019-02-15 DIAGNOSIS — F410 Panic disorder [episodic paroxysmal anxiety] without agoraphobia: Secondary | ICD-10-CM

## 2019-02-23 DIAGNOSIS — E1065 Type 1 diabetes mellitus with hyperglycemia: Secondary | ICD-10-CM | POA: Diagnosis not present

## 2019-03-03 ENCOUNTER — Other Ambulatory Visit (HOSPITAL_COMMUNITY): Payer: Self-pay | Admitting: Psychiatry

## 2019-03-03 DIAGNOSIS — F410 Panic disorder [episodic paroxysmal anxiety] without agoraphobia: Secondary | ICD-10-CM

## 2019-03-03 DIAGNOSIS — F331 Major depressive disorder, recurrent, moderate: Secondary | ICD-10-CM

## 2019-03-04 ENCOUNTER — Other Ambulatory Visit: Payer: Medicaid Other

## 2019-03-05 ENCOUNTER — Other Ambulatory Visit: Payer: Self-pay

## 2019-03-09 ENCOUNTER — Other Ambulatory Visit: Payer: Self-pay

## 2019-03-09 ENCOUNTER — Ambulatory Visit (INDEPENDENT_AMBULATORY_CARE_PROVIDER_SITE_OTHER): Payer: Medicaid Other | Admitting: Endocrinology

## 2019-03-09 ENCOUNTER — Encounter: Payer: Self-pay | Admitting: Endocrinology

## 2019-03-09 VITALS — BP 110/70 | HR 96 | Ht 64.0 in | Wt 148.6 lb

## 2019-03-09 DIAGNOSIS — E1065 Type 1 diabetes mellitus with hyperglycemia: Secondary | ICD-10-CM

## 2019-03-09 DIAGNOSIS — E7800 Pure hypercholesterolemia, unspecified: Secondary | ICD-10-CM | POA: Diagnosis not present

## 2019-03-09 NOTE — Progress Notes (Signed)
Patient ID: Sierra Schneider, female   DOB: 08-11-1985, 34 y.o.   MRN: [id number]   Reason for Appointment: Diabetes followup:   History of Present Illness   Diagnosis: Type 1 DIABETES MELITUS     DIABETES history: She has had diabetes since about 2011 and has been on insulin pump for several years She had fairly good control in the first 2 years of her disease but blood sugar has been more difficult subsequently  CURRENT insulin pump:  Medtronic 670 G  SETTINGS are:    Basal rate on current download = Midnight = 1.3, 7 AM = 1.4, 10 AM = 0.6, 12 noon = 1.3, 6 PM = 1.05 and 7 PM = 0.9, 10 PM = 1.0  Total basal insulin = about 24 units Carb Ratio: 1:9.  1: 14 at 5 PM Correction factor 1: 30, target 100 Active insulin time 3 hours    Current management, blood sugar patterns and problems identified:  Her A1c has been about the same around 7% and last 7.9 in November 2020  Pump data: She has been in the AUTO mode 65% of the time compared to 84% previously Reasons for exiting auto mode are high glucose, max delivery and manual delivery 1 time each and disabled by user once   CONTINUOUS GLUCOSE MONITORING RECORD INTERPRETATION    Dates of Recording: 12/29 through 03/09/2019  Sensor description: Guardian  Results statistics:   CGM use % of time  63  Average and SD  157+/-52, GV 33  Time in range    70    %  % Time Above 180  22  % Time above 250  7  % Time Below target  1    PRE-MEAL Fasting Lunch Dinner Bedtime Overall  Glucose range:       Mean/median:   181  189  115/213  157   POST-MEAL PC Breakfast PC Lunch PC Dinner  Glucose range:     Mean/median:   147  176    Glycemic patterns summary: Blood sugars are analyzed from CGM and this is active only 63% of the time Blood sugars appear to be higher mostly postprandially after lunch and dinnertime causing HYPERGLYCEMIA around 1 PM and 9 PM when the average is around 180-200 Hypoglycemia is minimal but low  normal blood sugars are frequently occurring mid afternoon  Hyperglycemic episodes are documented midday before lunch, also before dinner and a few times after dinner at 9 PM  Hypoglycemic episodes occurred infrequently and with minimal hypoglycemia and randomly either at 11:30 AM, 3:30 PM and 5 PM   Overnight periods: Blood sugars are generally fairly stable and steady with some variability around 2-4 AM  Preprandial periods: Usually not having breakfast Blood sugars are quite variable at lunch and dinner but appears to have a bolus after the blood sugar appears to be going up at mealtimes, averages as above around 180-190  Postprandial periods:   She does not usually have a breakfast bolus  After lunch: Blood sugars which are generally higher premeal on an average 5 variable postprandially with periodically low normal readings  After dinner: Blood sugars on an average are relatively unchanged but high at the time of bolus.   BLOOD SUGAR PATTERNS, management and problems:   HYPERGLYCEMIA is occurring at different times and now mostly related to late boluses at the time of meal especially at lunchtime  She has not used her sensor consistently and generally has higher readings when  she is not the CGM and the auto mode  Also hypoglycemia and occurring on some days from underestimation of her bolus amount particularly for higher fat meals such as higher fat meats or fried foods  She periodically appears to be bolusing late at the time of her meals when the blood sugar is already going up  This is more often to at lunch and occasionally at breakfast also  She is not doing much exercise and does not think her activities like walking is causing her sugars to be low normal in the afternoon  She continues to use Fiasp  Occasionally will forget to do her calibration in the morning and will do it later in the day  Otherwise she does not think she is having any issues with her sensor or  transmitter usually  Has gradually gained weight since last year    Wt Readings from Last 3 Encounters:  03/09/19 148 lb 9.6 oz (67.4 kg)  09/17/18 145 lb 9.6 oz (66 kg)  02/14/18 141 lb (64 kg)     LABS:  Lab Results  Component Value Date   HGBA1C 7.9 (H) 01/12/2019   HGBA1C 7.0 (H) 09/10/2018   HGBA1C 6.8 (H) 01/27/2018   Lab Results  Component Value Date   MICROALBUR 2.6 (H) 01/12/2019   LDLCALC 69 09/10/2018   CREATININE 0.69 01/12/2019    OTHER active problems discussed today: See review of systems    Allergies as of 03/09/2019   No Known Allergies     Medication List       Accurate as of March 09, 2019  4:08 PM. If you have any questions, ask your nurse or doctor.        STOP taking these medications   clonazePAM 0.5 MG tablet Commonly known as: KlonoPIN Stopped by: Elayne Snare, MD     TAKE these medications   buPROPion 300 MG 24 hr tablet Commonly known as: WELLBUTRIN XL Take 1 tablet (300 mg total) by mouth daily.   Fiasp 100 UNIT/ML Soln Generic drug: Insulin Aspart (w/Niacinamide) USE 80 UNITS PER INSULIN PUMP PER DAY.   hydrOXYzine 50 MG capsule Commonly known as: VISTARIL Take 1-2 capsule at bed time   lamoTRIgine 150 MG tablet Commonly known as: LAMICTAL Take 2 tablets (300 mg total) by mouth daily.   rOPINIRole 1 MG tablet Commonly known as: REQUIP Take 1 tablet (1 mg total) by mouth at bedtime.   rosuvastatin 20 MG tablet Commonly known as: CRESTOR TAKE 1 TABLET BY MOUTH EVERY DAY       Allergies: No Known Allergies  Past Medical History:  Diagnosis Date  . Depression   . Diabetes mellitus 08/2009   late onset type 1    Past Surgical History:  Procedure Laterality Date  . CESAREAN SECTION    . ESOPHAGOGASTRODUODENOSCOPY Left 10/14/2012   Procedure: ESOPHAGOGASTRODUODENOSCOPY (EGD);  Surgeon: Winfield Cunas., MD;  Location: [facility] END[facility]COPY;  Service: Endoscopy;  Laterality: Left;  . WISDOM TOOTH EXTRACTION       Family History  Problem Relation Age of Onset  . Depression Mother   . Hypertension Father   . Hyperlipidemia Father   . Kidney disease Paternal Grandmother   . Diabetes Maternal Grandfather   . ADD / ADHD Brother   . Depression Brother   . Drug abuse Brother   . Depression Maternal Aunt   . Depression Paternal Aunt   . Psychosis Cousin   . Heart disease Neg Hx  Social History:  reports that she quit smoking about 5 years ago. She smoked 0.00 packs per day. She has never used smokeless tobacco. She reports current alcohol use. She reports that she does not use drugs.  REVIEW of systems:   She has had  HYPERCHOLESTEROLEMIA,  Because of her family history of hypercholesterolemia and her diabetes she was previously on pravastatin, now on Crestor 20 mg with baseline of LDL of 146  LDL well controlled as follows and appears stable:  Lab Results  Component Value Date   CHOL 138 09/10/2018   CHOL 125 09/09/2017   CHOL 140 02/21/2017   Lab Results  Component Value Date   HDL 52.80 09/10/2018   HDL 50.70 09/09/2017   HDL 49.70 02/21/2017   Lab Results  Component Value Date   LDLCALC 69 09/10/2018   LDLCALC 63 09/09/2017   LDLCALC 79 02/21/2017   Lab Results  Component Value Date   TRIG 81.0 09/10/2018   TRIG 61.0 09/09/2017   TRIG 59.0 02/21/2017   Lab Results  Component Value Date   CHOLHDL 3 09/10/2018   CHOLHDL 2 09/09/2017   CHOLHDL 3 02/21/2017   Lab Results  Component Value Date   LDLDIRECT 129.4 12/29/2012       EXAM:  BP 110/70 (BP Location: Left Arm, Patient Position: Sitting, Cuff Size: Normal)   Pulse 96   Ht 5\' 4"  (1.626 m)   Wt 148 lb 9.6 oz (67.4 kg)   SpO2 98%   BMI 25.51 kg/m    ASSESSMENT:    Type I diabetes on Medtronic 670 pump  See history of present illness for detailed discussion of  current management, blood sugar patterns and problems identified  Her last A1c was 7.9, higher than usual   Most of her  hyperglycemia is related to late or inadequate boluses despite using Fiasp  Reviewed her CGM results as well as difficulties with mealtime control and factors causing hypoglycemia She sometimes has higher fat intake which she does not usually take into account Also discussed that if she is bolusing late especially at lunchtime she tends to cause low normal readings in the mid afternoon She does need to use her sensor more consistently Also discussed need for staying in the auto mode as much as possible With her tendency to weight gain likely is needing to cut back on caloric intake and carbohydrates  Recommendations:  She will try to adjust her boluses based on both carbohydrate intake and fat content Generally we should try to avoid high-fat foods She needs to look up her carbohydrate counts more accurately She needs to bolus consistently before starting to eat such as when she is putting her food in the microwave or toaster Also consider trial of Lyumjev instead of Fiasp if available on her insurance Recommend trying to see if she is eligible for the 770 pump upgrade which may have better sensor accuracy and reliability  May bolus a second time if blood sugar is rising an hour after eating Try to bolus for snacks also Call if having hypoglycemia Change infusion set every 3 to 5 days consistently To have A1c checked on the next visit  LIPIDS: Well controlled as of 7/20, will need to recheck her labs on the next visit, meanwhile continue Crestor  There are no Patient Instructions on file for this visit.      8/20 03/09/2019, 4:08 PM

## 2019-03-09 NOTE — Patient Instructions (Signed)
Bolus before each meal °

## 2019-03-17 ENCOUNTER — Other Ambulatory Visit: Payer: Self-pay

## 2019-03-17 DIAGNOSIS — M9902 Segmental and somatic dysfunction of thoracic region: Secondary | ICD-10-CM | POA: Diagnosis not present

## 2019-03-17 DIAGNOSIS — M5414 Radiculopathy, thoracic region: Secondary | ICD-10-CM | POA: Diagnosis not present

## 2019-03-17 DIAGNOSIS — M531 Cervicobrachial syndrome: Secondary | ICD-10-CM | POA: Diagnosis not present

## 2019-03-17 DIAGNOSIS — M9901 Segmental and somatic dysfunction of cervical region: Secondary | ICD-10-CM | POA: Diagnosis not present

## 2019-03-17 MED ORDER — LYUMJEV 100 UNIT/ML IJ SOLN: 80.0000 [IU] | Freq: Every day | INTRAMUSCULAR | 2 refills | Status: DC

## 2019-03-18 ENCOUNTER — Telehealth: Payer: Self-pay

## 2019-03-18 NOTE — Telephone Encounter (Signed)
PA initiated via [facility] [employer] call center for Lyumjev vials. PA was approved from 03/18/2019 through 03/12/2020 Interactive Caller ER:[id number] HN#:[id number]

## 2019-03-20 ENCOUNTER — Telehealth (HOSPITAL_COMMUNITY): Payer: Self-pay | Admitting: *Deleted

## 2019-03-20 NOTE — Telephone Encounter (Signed)
Writer received refill request for Requip, Ropinirole HCL 1 mg qhs. Pt has no upcoming appointments. Writer spoke with pt to remind her she needs appointment for further refills. Pt verbalizes understanding. Writer transferred pt to front desk for appointment. At this time pt does not have an upcoming appt.

## 2019-03-25 DIAGNOSIS — E1065 Type 1 diabetes mellitus with hyperglycemia: Secondary | ICD-10-CM | POA: Diagnosis not present

## 2019-03-30 DIAGNOSIS — M9901 Segmental and somatic dysfunction of cervical region: Secondary | ICD-10-CM | POA: Diagnosis not present

## 2019-03-30 DIAGNOSIS — M531 Cervicobrachial syndrome: Secondary | ICD-10-CM | POA: Diagnosis not present

## 2019-03-30 DIAGNOSIS — M5414 Radiculopathy, thoracic region: Secondary | ICD-10-CM | POA: Diagnosis not present

## 2019-03-30 DIAGNOSIS — M9902 Segmental and somatic dysfunction of thoracic region: Secondary | ICD-10-CM | POA: Diagnosis not present

## 2019-04-06 DIAGNOSIS — M9901 Segmental and somatic dysfunction of cervical region: Secondary | ICD-10-CM | POA: Diagnosis not present

## 2019-04-06 DIAGNOSIS — M5414 Radiculopathy, thoracic region: Secondary | ICD-10-CM | POA: Diagnosis not present

## 2019-04-06 DIAGNOSIS — M531 Cervicobrachial syndrome: Secondary | ICD-10-CM | POA: Diagnosis not present

## 2019-04-06 DIAGNOSIS — M9902 Segmental and somatic dysfunction of thoracic region: Secondary | ICD-10-CM | POA: Diagnosis not present

## 2019-04-07 ENCOUNTER — Telehealth: Payer: Self-pay | Admitting: Nutrition

## 2019-04-07 NOTE — Telephone Encounter (Signed)
Patient was told that she must contact [employer] to see the status of this.  Telephone number given.  I told her that I will also check on this for her with the [employer] rep. That is coming in today,

## 2019-04-13 DIAGNOSIS — M9901 Segmental and somatic dysfunction of cervical region: Secondary | ICD-10-CM | POA: Diagnosis not present

## 2019-04-13 DIAGNOSIS — M5414 Radiculopathy, thoracic region: Secondary | ICD-10-CM | POA: Diagnosis not present

## 2019-04-13 DIAGNOSIS — M9902 Segmental and somatic dysfunction of thoracic region: Secondary | ICD-10-CM | POA: Diagnosis not present

## 2019-04-13 DIAGNOSIS — M531 Cervicobrachial syndrome: Secondary | ICD-10-CM | POA: Diagnosis not present

## 2019-04-20 ENCOUNTER — Ambulatory Visit (INDEPENDENT_AMBULATORY_CARE_PROVIDER_SITE_OTHER): Payer: Medicaid Other | Admitting: Family Medicine

## 2019-04-20 ENCOUNTER — Other Ambulatory Visit: Payer: Self-pay

## 2019-04-20 ENCOUNTER — Ambulatory Visit
Admission: RE | Admit: 2019-04-20 | Discharge: 2019-04-20 | Disposition: A | Discharge status: Home / Self Care | Payer: Medicaid Other | Source: Ambulatory Visit | Attending: Family Medicine | Admitting: Family Medicine

## 2019-04-20 VITALS — BP 102/62 | HR 88 | Temp 98.0°F | Wt 145.0 lb

## 2019-04-20 DIAGNOSIS — M545 Low back pain, unspecified: Secondary | ICD-10-CM | POA: Insufficient documentation

## 2019-04-20 MED ORDER — METHOCARBAMOL 500 MG PO TABS: 500.0000 mg | ORAL_TABLET | Freq: Every evening | ORAL | 0 refills | Status: DC | PRN

## 2019-04-20 NOTE — Progress Notes (Signed)
    SUBJECTIVE:   CHIEF COMPLAINT / HPI:   BACK PAIN  Back pain began 3 weeks ago. Pain is described as constant, has shifted from a dull pain to a sharp pain with movement of bilateral legs. Patient has tried bending forward, sitting up straight, back stretches, heating pad, icy hot and Aleve. Pain radiates down her right buttock and leg. History of trauma or injury: no Patient believes might be causing their pain: does not know  Prior history of similar pain: yes, but not this long, thinks it resolved spontaneously History of cancer: no Weak immune system:  T1DM History of IV drug use: no  History of steroid use: no  Symptoms Incontinence of bowel or bladder:  no Numbness of leg: no Fever: no Rest or Night pain: yes Weight Loss: no Rash: no   ROS see HPI Smoking Status noted.   PERTINENT  PMH / PSH: Uncontrolled type 1 diabetes, hidradenitis, GAD  OBJECTIVE:   BP 102/62   Pulse 88   Temp 98 F (36.7 C) (Oral)   Wt 145 lb (65.8 kg)   SpO2 99%   BMI 24.89 kg/m   General: Appears stated age, NAD, although appears slightly uncomfortable   Back -patient standing stooped to reduce pain.  Normal skin, Spine with normal alignment and no deformity.  No tenderness to vertebral process palpation.  Paraspinous muscles in the bilateral lumbar region are tender but are without spasm.   Range of motion is full at neck but is significantly limited in the lumbar sacral regions due to pain.  Straight leg test negative bilaterally - pain goes to buttock but not down the leg.  Patellar reflexes brisk and equal bilaterally.  4/5 strength on hip flexion and knee extension due to pain.  Neurovascularly intact.   ASSESSMENT/PLAN:   Acute bilateral low back pain without sciatica Due to radiation of pain and tingling, likely nerve related.  Cause is unclear; patient is too young to make arthritis a likely cause of nerve compression, and she did not develop pain suddenly, which would be  more consistent with a disc herniation.  We will treat symptomatically with Tylenol, Voltaren gel, heat pad, Robaxin nightly, and obtain lumbar spine x-rays today.  May need MRI for further visualization depending on results of x-ray.  Counseled patient to remain as active as she can tolerate and to perform back stretches and exercises for low back pain once her pain has improved somewhat.  Gave patient return precautions, including sudden onset weakness, numbness, and incontinence.     Lennox Solders, MD [facility]

## 2019-04-20 NOTE — Assessment & Plan Note (Signed)
Due to radiation of pain and tingling, likely nerve related.  Cause is unclear; patient is too young to make arthritis a likely cause of nerve compression, and she did not develop pain suddenly, which would be more consistent with a disc herniation.  We will treat symptomatically with Tylenol, Voltaren gel, heat pad, Robaxin nightly, and obtain lumbar spine x-rays today.  May need MRI for further visualization depending on results of x-ray.  Counseled patient to remain as active as she can tolerate and to perform back stretches and exercises for low back pain once her pain has improved somewhat.  Gave patient return precautions, including sudden onset weakness, numbness, and incontinence.

## 2019-04-20 NOTE — Patient Instructions (Addendum)
It was nice seeing you today Sierra Schneider!  Your back pain is likely due to some nerves that are being pinched.  We we will try Robaxin nightly as well as up to 4 g of Tylenol per day and Voltaren gel for pain relief.  You can also use a heating pad as long as you do not fall asleep with it.  I am ordering x-rays of your lumbar spine today.  We may need to consider further imaging depending on what these images look like.  If you develop incontinence or numbness, please go to the emergency department.  This is unlikely but very serious if it does occur.  If you have any questions or concerns, please feel free to call the clinic.   Be well,  Dr. Frances Furbish

## 2019-04-22 ENCOUNTER — Encounter: Payer: Self-pay | Admitting: Family Medicine

## 2019-04-22 ENCOUNTER — Other Ambulatory Visit: Payer: Self-pay | Admitting: Family Medicine

## 2019-04-22 ENCOUNTER — Telehealth: Payer: Self-pay | Admitting: Endocrinology

## 2019-04-22 MED ORDER — MELOXICAM 7.5 MG PO TABS: 7.5000 mg | ORAL_TABLET | Freq: Every day | ORAL | 0 refills | Status: DC

## 2019-04-22 NOTE — Progress Notes (Signed)
Patient was informed of normal spine x-ray.  We discussed that her pain will most likely improve on its own, but that this may take 1-2 months.  Patient asked for medication for pain relief, so Meloxicam 7.5 mg once daily with food was prescribed.

## 2019-04-22 NOTE — Telephone Encounter (Signed)
NA

## 2019-04-24 DIAGNOSIS — E1065 Type 1 diabetes mellitus with hyperglycemia: Secondary | ICD-10-CM | POA: Diagnosis not present

## 2019-05-06 DIAGNOSIS — E1065 Type 1 diabetes mellitus with hyperglycemia: Secondary | ICD-10-CM | POA: Diagnosis not present

## 2019-05-11 DIAGNOSIS — E1065 Type 1 diabetes mellitus with hyperglycemia: Secondary | ICD-10-CM | POA: Diagnosis not present

## 2019-05-21 ENCOUNTER — Ambulatory Visit (INDEPENDENT_AMBULATORY_CARE_PROVIDER_SITE_OTHER): Payer: Medicaid Other | Admitting: Psychiatry

## 2019-05-21 ENCOUNTER — Other Ambulatory Visit: Payer: Self-pay

## 2019-05-21 ENCOUNTER — Encounter (HOSPITAL_COMMUNITY): Payer: Self-pay | Admitting: Psychiatry

## 2019-05-21 DIAGNOSIS — F331 Major depressive disorder, recurrent, moderate: Secondary | ICD-10-CM | POA: Diagnosis not present

## 2019-05-21 DIAGNOSIS — G2581 Restless legs syndrome: Secondary | ICD-10-CM | POA: Diagnosis not present

## 2019-05-21 DIAGNOSIS — F4321 Adjustment disorder with depressed mood: Secondary | ICD-10-CM

## 2019-05-21 DIAGNOSIS — F419 Anxiety disorder, unspecified: Secondary | ICD-10-CM | POA: Diagnosis not present

## 2019-05-21 MED ORDER — TRAZODONE HCL 50 MG PO TABS: 50.0000 mg | ORAL_TABLET | Freq: Every evening | ORAL | 1 refills | Status: DC | PRN

## 2019-05-21 MED ORDER — BUPROPION HCL ER (XL) 300 MG PO TB24: 300.0000 mg | ORAL_TABLET | Freq: Every day | ORAL | 0 refills | Status: DC

## 2019-05-21 MED ORDER — ROPINIROLE HCL 1 MG PO TABS: 1.0000 mg | ORAL_TABLET | Freq: Every day | ORAL | 0 refills | Status: DC

## 2019-05-21 MED ORDER — LAMOTRIGINE 150 MG PO TABS: 300.0000 mg | ORAL_TABLET | Freq: Every day | ORAL | 0 refills | Status: DC

## 2019-05-21 NOTE — Progress Notes (Addendum)
Virtual Visit via Telephone Note  I connected with Sierra Schneider on 05/21/19 at  1:20 PM EDT by telephone and verified that I am speaking with the correct person using two identifiers.   I discussed the limitations, risks, security and privacy concerns of performing an evaluation and management service by telephone and the availability of in person appointments. I also discussed with the patient that there may be a patient responsible charge related to this service. The patient expressed understanding and agreed to proceed.   History of Present Illness: Patient was evaluated by phone session.  She missed her last appointment as lately busy with her job.  Recently her grandfather died earlier this month that she has been very sad and disturbed with a loss of grandfather.  Patient told he had a stroke but she was expecting he will recover but eventually died.  She reported crying spells, sadness and poor sleep.  Though she denies any suicidal thoughts or homicidal thought but feel easily overwhelmed.  Her 35 year old and 32 year old daughter started hybrid school and at least she is pleased with that.  She also having issues with her blood sugar.  Recently her hemoglobin A1c was slightly increased to 7.9.  Her endocrinologist is changing her medication and she is waiting for the supplies.  She denies any paranoia, hallucination or any anger.  She had a support from her parents, fianc.  Recently had a job promotion and she has more responsibilities.  She also reported that sometimes she has to take care of her grandmother who is now widowed and they were married for more than 60 years.  She is tolerating her medication and reported no tremors, rash, itching or any shakes.  Energy level is fair.  Her appetite is okay.  She admitted few pounds weight gain.  She denies drinking or using any illegal substances.    Past Psychiatric History:Reviewed. H/Odepressionafterher daughter born. TookProzac,  BuSpar, Effexor, Zoloft and Cymbalta.No H/Osuicidal attempt or any psychiatric inpatient treatment,mania psychosis or any hallucination.  No results found for this or any previous visit (from the past 2160 hour(s)).    Psychiatric Specialty Exam: Physical Exam  Review of Systems  There were no vitals taken for this visit.There is no height or weight on file to calculate BMI.  General Appearance: NA  Eye Contact:  NA  Speech:  Clear and Coherent and Normal Rate  Volume:  Normal  Mood:  Anxious and Dysphoric  Affect:  NA  Thought Process:  Goal Directed  Orientation:  Full (Time, Place, and Person)  Thought Content:  Rumination  Suicidal Thoughts:  No  Homicidal Thoughts:  No  Memory:  Immediate;   Good Recent;   Good Remote;   Good  Judgement:  Fair  Insight:  Fair  Psychomotor Activity:  NA  Concentration:  Concentration: Good and Attention Span: Good  Recall:  Good  Fund of Knowledge:  Good  Language:  Good  Akathisia:  No  Handed:  Right  AIMS (if indicated):     Assets:  Communication Skills Desire for Improvement Housing Resilience Social Support Talents/Skills Transportation  ADL's:  Intact  Cognition:  WNL  Sleep:   on and off      Assessment and Plan: Major depressive disorder, recurrent.  Restless leg syndrome.  Anxiety.  Grief.  Discussed recent loss of her grandfather.  Recommended grief counseling but patient feels that she does not need it at this time but promised to call us back if needed.  She is not taking hydroxyzine and even though when she took it in the past it did not help her sleep.  She is open to try a different medication for sleep.  Recommend to try trazodone 50 mg to take half to 1 tablet as needed.  Continue Lamictal 300 mg daily, Wellbutrin XL 300 mg daily and Requip 1 mg at bedtime.  We will discontinue hydroxyzine.  Discussed medication side effects and benefits.  Recommended to call us back if she has any question or any concern.   Follow-up in 3 months.  Follow Up Instructions:    I discussed the assessment and treatment plan with the patient. The patient was provided an opportunity to ask questions and all were answered. The patient agreed with the plan and demonstrated an understanding of the instructions.   The patient was advised to call back or seek an in-person evaluation if the symptoms worsen or if the condition fails to improve as anticipated.  I provided 20 minutes of non-face-to-face time during this encounter.   Kathlee Nations, MD

## 2019-05-22 DIAGNOSIS — E1065 Type 1 diabetes mellitus with hyperglycemia: Secondary | ICD-10-CM | POA: Diagnosis not present

## 2019-06-01 ENCOUNTER — Other Ambulatory Visit: Payer: Medicaid Other

## 2019-06-02 ENCOUNTER — Other Ambulatory Visit (INDEPENDENT_AMBULATORY_CARE_PROVIDER_SITE_OTHER): Payer: Medicaid Other

## 2019-06-02 ENCOUNTER — Other Ambulatory Visit: Payer: Self-pay

## 2019-06-02 DIAGNOSIS — E7800 Pure hypercholesterolemia, unspecified: Secondary | ICD-10-CM

## 2019-06-02 DIAGNOSIS — E1065 Type 1 diabetes mellitus with hyperglycemia: Secondary | ICD-10-CM | POA: Diagnosis not present

## 2019-06-02 LAB — COMPREHENSIVE METABOLIC PANEL
ALT: 13 U/L (ref 0–35)
AST: 15 U/L (ref 0–37)
Albumin: 4.5 g/dL (ref 3.5–5.2)
Alkaline Phosphatase: 78 U/L (ref 39–117)
BUN: 7 mg/dL (ref 6–23)
CO2: 29 mEq/L (ref 19–32)
Calcium: 8.9 mg/dL (ref 8.4–10.5)
Chloride: 102 mEq/L (ref 96–112)
Creatinine, Ser: 0.68 mg/dL (ref 0.40–1.20)
GFR: 98.35 mL/min (ref >60.00)
Glucose, Bld: 149 mg/dL — ABNORMAL HIGH (ref 70–99)
Potassium: 3.5 mEq/L (ref 3.5–5.1)
Sodium: 137 mEq/L (ref 135–145)
Total Bilirubin: 0.4 mg/dL (ref 0.2–1.2)
Total Protein: 6.7 g/dL (ref 6.0–8.3)

## 2019-06-02 LAB — LIPID PANEL
Cholesterol: 148 mg/dL (ref 0–200)
HDL: 55.6 mg/dL (ref >39.00)
LDL Cholesterol: 77 mg/dL (ref 0–99)
NonHDL: 92.23
Total CHOL/HDL Ratio: 3
Triglycerides: 75 mg/dL (ref 0.0–149.0)
VLDL: 15 mg/dL (ref 0.0–40.0)

## 2019-06-02 LAB — HEMOGLOBIN A1C: Hgb A1c MFr Bld: 7.1 % — ABNORMAL HIGH (ref 4.6–6.5)

## 2019-06-08 ENCOUNTER — Ambulatory Visit: Payer: Medicaid Other | Admitting: Endocrinology

## 2019-06-16 ENCOUNTER — Other Ambulatory Visit (HOSPITAL_COMMUNITY): Payer: Self-pay | Admitting: Psychiatry

## 2019-06-16 DIAGNOSIS — F419 Anxiety disorder, unspecified: Secondary | ICD-10-CM

## 2019-06-17 ENCOUNTER — Other Ambulatory Visit (HOSPITAL_COMMUNITY): Payer: Self-pay | Admitting: *Deleted

## 2019-06-17 ENCOUNTER — Telehealth (HOSPITAL_COMMUNITY): Payer: Self-pay | Admitting: *Deleted

## 2019-06-17 MED ORDER — BUPROPION HCL ER (XL) 150 MG PO TB24: 150.0000 mg | ORAL_TABLET | ORAL | 2 refills | Status: DC

## 2019-06-17 NOTE — Telephone Encounter (Signed)
She is taking Wellbutrin XL 300 mg daily.  She can try taking extra 150 to help her focus and attention however if that did not work then she may need psychological testing to rule out ADD.  I will defer adding any stimulant at this time as it may cause worsening of anxiety and insomnia.  If she agree to try higher dose of Wellbutrin then please call Wellbutrin XL 150 mg to take with 300 mg.

## 2019-06-17 NOTE — Telephone Encounter (Signed)
Pt called c/o decreased focus with her job promotion. Says she is really struggling with this. She is asking if she may have "something to help me focus" even temporarily. Pt"s next visit Is not until 08/20/19. Would you like me to get her an earlier appointment?

## 2019-06-22 ENCOUNTER — Telehealth (HOSPITAL_COMMUNITY): Payer: Self-pay | Admitting: *Deleted

## 2019-06-22 NOTE — Telephone Encounter (Signed)
She should go back to Wellbutrin 300 since 450 is causing side effects.  We may need to do neurocognitive testing for focus issues.  Please call neurology if she agreed to schedule appointment for neurocognitive testing.

## 2019-06-22 NOTE — Telephone Encounter (Signed)
Pt called c/o not being able to tolerate increase in Wellbutrin from 300 to 450 due to lack of focus and concentration. Increase started on 06/18/19. Pt states that she had an "unbearable" headache since increasing and has tried but had to d/c the extra 150mg . Please review and advise a pt still asking for something to increase focus.

## 2019-06-23 ENCOUNTER — Encounter: Payer: Self-pay | Admitting: Endocrinology

## 2019-06-23 ENCOUNTER — Other Ambulatory Visit: Payer: Self-pay

## 2019-06-23 ENCOUNTER — Ambulatory Visit (INDEPENDENT_AMBULATORY_CARE_PROVIDER_SITE_OTHER): Payer: Medicaid Other | Admitting: Endocrinology

## 2019-06-23 VITALS — BP 102/70 | HR 91 | Ht 64.0 in | Wt 148.4 lb

## 2019-06-23 DIAGNOSIS — E1065 Type 1 diabetes mellitus with hyperglycemia: Secondary | ICD-10-CM | POA: Diagnosis not present

## 2019-06-23 DIAGNOSIS — E7800 Pure hypercholesterolemia, unspecified: Secondary | ICD-10-CM

## 2019-06-23 NOTE — Progress Notes (Signed)
Sample of Humalog 23mL given to pt today per instructions of Dr. Lucianne Muss.  LOT: [id number] C EX: 02/2021

## 2019-06-23 NOTE — Progress Notes (Signed)
Patient ID: Sierra Schneider, female   DOB: [birth year], 34 y.o.   MRN: [id number]   Reason for Appointment: Diabetes followup:   History of Present Illness   Diagnosis: Type 1 DIABETES MELITUS     DIABETES history: She has had diabetes since about 2011 and has been on insulin pump for several years She had fairly good control in the first 2 years of her disease but blood sugar control had been more difficult subsequently  CURRENT insulin pump:  Medtronic 770 G CURRENT insulin: Lyumjev since 1/21  SETTINGS are:    Basal rate on current download = Midnight = 1.3, 7 AM = 1.4, 10 AM = 0.6, 12 noon = 1.3, 6 PM = 1.05, 7 PM = 0.9, 10 PM = 1.0  Total basal insulin = about 24 units Carb Ratio: 1:9.  1: 14 at 5 PM Correction factor 1: 30, target 100 Active insulin time 3 hours    Current management, blood sugar patterns and problems identified:  Her A1c has been about the same around 7% and now 7.1  Pump data:  She has been in the AUTO mode 85 % of the time compared to 65 Reasons for exiting auto mode are high glucose most often, max delivery and disabled by user once   CONTINUOUS GLUCOSE MONITORING RECORD INTERPRETATION    Dates of Recording: Last 2 weeks  Sensor description: Guardian  Results statistics:   CGM use % of time  87  Average and SD  163+/-61, was 157  Time in range    61    %, 170 previously  % Time Above 180  27  % Time above 250  9  % Time Below target  3    PRE-MEAL  AC breakfast Lunch Dinner Bedtime Overall  Glucose range:    97-330    Mean/median:  158  150  197     POST-MEAL PC Breakfast PC Lunch PC Dinner  Glucose range:     Mean/median:  112  183  189    Glycemic patterns summary: Her blood sugars are showing somewhat more variability than before especially in the evenings after 7 PM.  Blood sugars are generally better controlled early morning and until about lunchtime.  However hypoglycemia has been minimal  Hyperglycemic episodes  are occurring most often mid afternoon after lunch and occasionally after dinner  Hypoglycemic episodes have been uncommon and mostly if she had not been on the auto mode  Overnight periods: Blood sugars are averaging about 180 at midnight and overall progressively improving until about 7 AM and initially having more variability  Preprandial periods: She usually not eating breakfast At lunchtime blood sugars are mildly increased Dinnertime blood sugars are mostly high with significant variability  Postprandial periods:     After lunch: Blood sugars are generally increasing to a mild degree but mostly between 2 and 3 hours later After dinner: Blood sugars overall are flat compared to premeal blood sugars with significant variability and sometimes rising after about 2 hours   Previous data:   CGM use % of time  63  Average and SD  157+/-52, GV 33  Time in range    70    %  % Time Above 180  22  % Time above 250  7  % Time Below target  1    PRE-MEAL Fasting Lunch Dinner Bedtime Overall  Glucose range:       Mean/median:   181  189  115/213  157   POST-MEAL PC Breakfast PC Lunch PC Dinner  Glucose range:     Mean/median:   147  176     BLOOD SUGAR PATTERNS, management and problems:   She is using the 770 pump which she thinks is a better sensor and she has been on the auto mode more often  However with trying Lyumjev who appears that her blood sugars may be mostly rising 1-3 hours after the meal and not within the first hour  This is despite usually trying to eat low-fat meals  Previously on Fiasp  Since she has a late lunch some of her readings before dinnertime are also high  However not  Most of her highest readings are late at night around 10-11 PM, only occasionally will have small snacks  Last night blood sugar was higher with only a small portion of watermelon    Wt Readings from Last 3 Encounters:  06/23/19 148 lb 6.4 oz (67.3 kg)  04/20/19 145 lb (65.8  kg)  03/09/19 148 lb 9.6 oz (67.4 kg)     LABS:  Lab Results  Component Value Date   HGBA1C 7.1 (H) 06/02/2019   HGBA1C 7.9 (H) 01/12/2019   HGBA1C 7.0 (H) 09/10/2018   Lab Results  Component Value Date   MICROALBUR 2.6 (H) 01/12/2019   LDLCALC 77 06/02/2019   CREATININE 0.68 06/02/2019    OTHER active problems discussed today: See review of systems    Allergies as of 06/23/2019   No Known Allergies     Medication List       Accurate as of June 23, 2019  4:37 PM. If you have any questions, ask your nurse or doctor.        STOP taking these medications   hydrOXYzine 50 MG capsule Commonly known as: VISTARIL Stopped by: Reather Littler, MD     TAKE these medications   buPROPion 300 MG 24 hr tablet Commonly known as: WELLBUTRIN XL Take 1 tablet (300 mg total) by mouth daily.   buPROPion 150 MG 24 hr tablet Commonly known as: Wellbutrin XL Take 1 tablet (150 mg total) by mouth every morning. Take 1 tablet po qam with Wellbutrin 300mg  dose.   lamoTRIgine 150 MG tablet Commonly known as: LAMICTAL Take 2 tablets (300 mg total) by mouth daily.   Lyumjev 100 UNIT/ML Soln Generic drug: Insulin Lispro-aabc Inject 80 Units as directed daily. Use max of 80 units daily via insulin pump.   meloxicam 7.5 MG tablet Commonly known as: MOBIC Take 1 tablet (7.5 mg total) by mouth daily.   methocarbamol 500 MG tablet Commonly known as: ROBAXIN Take 1 tablet (500 mg total) by mouth at bedtime as needed for muscle spasms.   rOPINIRole 1 MG tablet Commonly known as: REQUIP Take 1 tablet (1 mg total) by mouth at bedtime.   rosuvastatin 20 MG tablet Commonly known as: CRESTOR TAKE 1 TABLET BY MOUTH EVERY DAY   traZODone 50 MG tablet Commonly known as: DESYREL Take 1 tablet (50 mg total) by mouth at bedtime as needed for sleep.       Allergies: No Known Allergies  Past Medical History:  Diagnosis Date  . Depression   . Diabetes mellitus 08/2009   late onset type  1    Past Surgical History:  Procedure Laterality Date  . CESAREAN SECTION    . ESOPHAGOGASTRODUODENOSCOPY Left 10/14/2012   Procedure: ESOPHAGOGASTRODUODENOSCOPY (EGD);  Surgeon: 10/16/2012., MD;  Location: [facility] ENDOSCOPY;  Service: Endoscopy;  Laterality: Left;  . WISDOM TOOTH EXTRACTION      Family History  Problem Relation Age of Onset  . Depression Mother   . Hypertension Father   . Hyperlipidemia Father   . Kidney disease Paternal Grandmother   . Diabetes Maternal Grandfather   . ADD / ADHD Brother   . Depression Brother   . Drug abuse Brother   . Depression Maternal Aunt   . Depression Paternal Aunt   . Psychosis Cousin   . Heart disease Neg Hx     Social History:  reports that she quit smoking about 5 years ago. She smoked 0.00 packs per day. She has never used smokeless tobacco. She reports current alcohol use. She reports that she does not use drugs.  REVIEW of systems:   She has had  HYPERCHOLESTEROLEMIA,  Because of her family history of hypercholesterolemia and her diabetes she was previously on pravastatin, now on Crestor 20 mg with baseline of LDL of 146  LDL well controlled as follows although slightly higher than before  Lab Results  Component Value Date   CHOL 148 06/02/2019   CHOL 138 09/10/2018   CHOL 125 09/09/2017   Lab Results  Component Value Date   HDL 55.60 06/02/2019   HDL 52.80 09/10/2018   HDL 50.70 09/09/2017   Lab Results  Component Value Date   LDLCALC 77 06/02/2019   LDLCALC 69 09/10/2018   LDLCALC 63 09/09/2017   Lab Results  Component Value Date   TRIG 75.0 06/02/2019   TRIG 81.0 09/10/2018   TRIG 61.0 09/09/2017   Lab Results  Component Value Date   CHOLHDL 3 06/02/2019   CHOLHDL 3 09/10/2018   CHOLHDL 2 09/09/2017   Lab Results  Component Value Date   LDLDIRECT 129.4 12/29/2012       EXAM:  BP 102/70 (BP Location: Left Arm, Patient Position: Sitting, Cuff Size: Normal)   Pulse 91   Ht 5\' 4"  (1.626  m)   Wt 148 lb 6.4 oz (67.3 kg)   SpO2 98%   BMI 25.47 kg/m    ASSESSMENT:    Type I diabetes on Medtronic 770 pump  See history of present illness for detailed discussion of  current management, blood sugar patterns and problems identified  Her last A1c was 7.9, now it is better   Previously had been on Fiasp and now on Lyumjev Not clear if this is better for her postprandial readings which seem to be higher at times especially 2 to 3 hours after the meal and not right after Otherwise blood sugars at times are higher for no apparent reason Hypoglycemia occurs mostly when she is not in the auto mode  Her diet has been relatively healthy Blood sugar variability is significant from day-to-day also   Recommendations:  She will need to link her phone app and the insulin pump and this will also enable her to upload her pump for review through [facility] of Humalog with a sample Most likely she will need to bolus for her meals ahead of time instead of right before eating with Humalog Postprandial patterns will be reviewed again on Humalog and she will let know before she needs a prescription for evaluation of her patterns  This should help preventing a late rise in blood sugars and 3-hour duration of action of insulin should be adequate with this Encourage her to be active with exercise and brisk walking  Continue trying to consume low-fat meals  LIPIDS: Well controlled and she will continue Crestor  There are no Patient Instructions on file for this visit.      Elayne Snare 06/23/2019, 4:37 PM

## 2019-06-24 ENCOUNTER — Telehealth (HOSPITAL_COMMUNITY): Payer: Self-pay | Admitting: *Deleted

## 2019-06-24 NOTE — Telephone Encounter (Signed)
Writer faxed last visit note and recent phone encounters to [facility] per Dr. Lolly Mustache for neurocognitive testing.

## 2019-06-27 DIAGNOSIS — E1065 Type 1 diabetes mellitus with hyperglycemia: Secondary | ICD-10-CM | POA: Diagnosis not present

## 2019-07-01 ENCOUNTER — Telehealth (HOSPITAL_COMMUNITY): Payer: Self-pay | Admitting: *Deleted

## 2019-07-01 NOTE — Telephone Encounter (Signed)
thanks

## 2019-07-07 ENCOUNTER — Telehealth (HOSPITAL_COMMUNITY): Payer: Self-pay | Admitting: *Deleted

## 2019-07-07 NOTE — Telephone Encounter (Signed)
thanks

## 2019-07-07 NOTE — Telephone Encounter (Signed)
Writer was able to fax over last progress note and insurance info to [facility] neuropsychology, Dr. Jacquelyne Balint for neurocognitive testing. If acepted they will contat pt with appointment information.

## 2019-07-13 ENCOUNTER — Other Ambulatory Visit: Payer: Self-pay

## 2019-07-13 ENCOUNTER — Telehealth: Payer: Self-pay | Admitting: Endocrinology

## 2019-07-13 DIAGNOSIS — E1065 Type 1 diabetes mellitus with hyperglycemia: Secondary | ICD-10-CM

## 2019-07-13 MED ORDER — INSULIN LISPRO 100 UNIT/ML ~~LOC~~ SOLN: SUBCUTANEOUS | 2 refills | Status: DC

## 2019-07-13 NOTE — Telephone Encounter (Signed)
Medication Refill Request  Did you call your pharmacy and request this refill first? Yes  . If patient has not contacted pharmacy first, instruct them to do so for future refills.  . Remind them that contacting the pharmacy for their refill is the quickest method to get the refill.  . Refill policy also stated that it will take anywhere between 24-72 hours to receive the refill.    Name of medication? humalog - new RX - patient states she got a sample from Dr Lucianne Muss and it worked really well for her.  Is this a 90 day supply? yes  Name and location of pharmacy?  [id number] - [place], [place] - [address] Phone:  [phone]  Fax:  [phone]

## 2019-07-13 NOTE — Telephone Encounter (Signed)
Please review and advise.    Thank you.

## 2019-07-13 NOTE — Telephone Encounter (Signed)
Humalog vials, Use max of 80 units daily via insulin pump.

## 2019-07-13 NOTE — Telephone Encounter (Signed)
Patient notified that RX was sent to pharmacy.

## 2019-07-15 ENCOUNTER — Other Ambulatory Visit (HOSPITAL_COMMUNITY): Payer: Self-pay | Admitting: Psychiatry

## 2019-07-28 DIAGNOSIS — E1065 Type 1 diabetes mellitus with hyperglycemia: Secondary | ICD-10-CM | POA: Diagnosis not present

## 2019-08-01 ENCOUNTER — Other Ambulatory Visit (HOSPITAL_COMMUNITY): Payer: Self-pay | Admitting: Psychiatry

## 2019-08-01 DIAGNOSIS — F419 Anxiety disorder, unspecified: Secondary | ICD-10-CM

## 2019-08-14 ENCOUNTER — Telehealth (HOSPITAL_COMMUNITY): Payer: Self-pay | Admitting: *Deleted

## 2019-08-14 NOTE — Telephone Encounter (Signed)
Pt called stating that he brother who has been clean for some time has relapsed and she is distraught about this. Pt sounded tearful, anxious, upset. Mckynzie is wondering if you would prescribe something "short term" too help with the anxiety. Please review and advise. Pt has an appt on 08/20/19.

## 2019-08-15 ENCOUNTER — Other Ambulatory Visit (HOSPITAL_COMMUNITY): Payer: Self-pay | Admitting: Psychiatry

## 2019-08-15 DIAGNOSIS — G2581 Restless legs syndrome: Secondary | ICD-10-CM

## 2019-08-17 ENCOUNTER — Other Ambulatory Visit (HOSPITAL_COMMUNITY): Payer: Self-pay | Admitting: *Deleted

## 2019-08-17 MED ORDER — BUSPIRONE HCL 5 MG PO TABS: 5.0000 mg | ORAL_TABLET | Freq: Every day | ORAL | 0 refills | Status: DC

## 2019-08-17 MED ORDER — BUSPIRONE HCL 5 MG PO TABS: 5.0000 mg | ORAL_TABLET | Freq: Every day | ORAL | 0 refills | Status: DC | PRN

## 2019-08-17 NOTE — Telephone Encounter (Signed)
Please provide Buspar 5 mg daily as needed for anxiety.

## 2019-08-20 ENCOUNTER — Telehealth (INDEPENDENT_AMBULATORY_CARE_PROVIDER_SITE_OTHER): Payer: Medicaid Other | Admitting: Psychiatry

## 2019-08-20 ENCOUNTER — Encounter (HOSPITAL_COMMUNITY): Payer: Self-pay | Admitting: Psychiatry

## 2019-08-20 ENCOUNTER — Other Ambulatory Visit: Payer: Self-pay

## 2019-08-20 VITALS — Wt 148.0 lb

## 2019-08-20 DIAGNOSIS — F419 Anxiety disorder, unspecified: Secondary | ICD-10-CM | POA: Diagnosis not present

## 2019-08-20 DIAGNOSIS — G2581 Restless legs syndrome: Secondary | ICD-10-CM | POA: Diagnosis not present

## 2019-08-20 DIAGNOSIS — F331 Major depressive disorder, recurrent, moderate: Secondary | ICD-10-CM | POA: Diagnosis not present

## 2019-08-20 MED ORDER — LAMOTRIGINE 150 MG PO TABS: 300.0000 mg | ORAL_TABLET | Freq: Every day | ORAL | 0 refills | Status: DC

## 2019-08-20 MED ORDER — ROPINIROLE HCL 1 MG PO TABS: 1.0000 mg | ORAL_TABLET | Freq: Every day | ORAL | 0 refills | Status: DC

## 2019-08-20 MED ORDER — BUSPIRONE HCL 5 MG PO TABS: 5.0000 mg | ORAL_TABLET | Freq: Two times a day (BID) | ORAL | 0 refills | Status: DC

## 2019-08-20 MED ORDER — BUPROPION HCL ER (XL) 300 MG PO TB24: 300.0000 mg | ORAL_TABLET | Freq: Every day | ORAL | 0 refills | Status: DC

## 2019-08-20 NOTE — Progress Notes (Signed)
Virtual Visit via Telephone Note  I connected with Sierra Schneider on 08/20/19 at 11:00 AM EDT by telephone and verified that I am speaking with the correct person using two identifiers.   I discussed the limitations, risks, security and privacy concerns of performing an evaluation and management service by telephone and the availability of in person appointments. I also discussed with the patient that there may be a patient responsible charge related to this service. The patient expressed understanding and agreed to proceed.   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  She is under a lot of stress because recently find out that her younger brother relapsed into heroin addiction.  Her sister-in-law found him in the bathroom and he was given Narcan.  Patient told he has been sober for a while and going to methadone clinic until recently he decided not to go.  Patient is very tearful, emotional describing the family situation.  She told that she is the one who always helped everyone and she feels it is taking too much toll on her.  She endorsed having racing thoughts, increased anxiety, nervousness and tired and emotionally drained.  She go to sleep after she is tired exhausted.  She had a good support from her fianc and parents.  She is compliant with Lamictal, Requip and recently BuSpar.  We have tried higher dose of Wellbutrin but she could not handle and had a headaches.  She is still struggling with attention concentration and we had recommended psychological testing to rule out ADHD but patient did not follow-up with the testing.  She realized her attention focus could be feeling overwhelmed because of family situation.  On the other hand she recently got promoted and she has more job responsibilities.  She denies any paranoia, hallucination or any suicidal thoughts.  She is taking BuSpar started few days ago and so far  she do not have any side effects including tremor shakes or any EPS.  Her blood sugar is much better and her last hemoglobin A1c in April was 7.  Her endocrinologist is adjusting her insulin.  Her appetite is fair.  She endorsed feeling tired but denies any anger or any suicidal thoughts.  She takes Requip that helps her restless leg.   Past Psychiatric History:Reviewed. H/Odepressionafterher daughter born. TookProzac, hydroxyzine, increased Wellbutrin, Effexor, Zoloft and Cymbalta.No H/Osuicidal attempt or inpatient, mania and psychosis.   Recent Results (from the past 2160 hour(s))  Lipid panel     Status: None   Collection Time: 06/02/19  3:27 PM  Result Value Ref Range   Cholesterol 148 0 - 200 mg/dL    Comment: ATP III Classification       Desirable:  < 200 mg/dL               Borderline High:  200 - 239 mg/dL          High:  > = 098 mg/dL   Triglycerides 11.9 0 - 149 mg/dL    Comment: Normal:  <147 mg/dLBorderline High:  150 - 199 mg/dL   HDL 82.95 >62.13 mg/dL   VLDL 08.6 0.0 - 57.8 mg/dL   LDL Cholesterol 77 0 - 99 mg/dL   Total CHOL/HDL Ratio 3     Comment:                Men          Women1/2 Average Risk  3.4          3.3Average Risk          5.0          4.42X Average Risk          9.6          7.13X Average Risk          15.0          11.0                       NonHDL 92.23     Comment: NOTE:  Non-HDL goal should be 30 mg/dL higher than patient's LDL goal (i.e. LDL goal of < 70 mg/dL, would have non-HDL goal of < 100 mg/dL)  Comprehensive metabolic panel     Status: Abnormal   Collection Time: 06/02/19  3:27 PM  Result Value Ref Range   Sodium 137 135 - 145 mEq/L   Potassium 3.5 3.5 - 5.1 mEq/L   Chloride 102 96 - 112 mEq/L   CO2 29 19 - 32 mEq/L   Glucose, Bld 149 (H) 70 - 99 mg/dL   BUN 7 6 - 23 mg/dL   Creatinine, Ser 6.06 0.40 - 1.20 mg/dL   Total Bilirubin 0.4 0.2 - 1.2 mg/dL   Alkaline Phosphatase 78 39 - 117 U/L   AST 15 0 - 37 U/L   ALT 13 0 - 35 U/L    Total Protein 6.7 6.0 - 8.3 g/dL   Albumin 4.5 3.5 - 5.2 g/dL   GFR 30.16 >01.09 mL/min   Calcium 8.9 8.4 - 10.5 mg/dL  Hemoglobin N2T     Status: Abnormal   Collection Time: 06/02/19  3:27 PM  Result Value Ref Range   Hgb A1c MFr Bld 7.1 (H) 4.6 - 6.5 %    Comment: Glycemic Control Guidelines for People with Diabetes:Non Diabetic:  <6%Goal of Therapy: <7%Additional Action Suggested:  >8%      Psychiatric Specialty Exam: Physical Exam  Review of Systems  Weight 148 lb (67.1 kg).There is no height or weight on file to calculate BMI.  General Appearance: NA  Eye Contact:  NA  Speech:  Normal Rate  Volume:  Decreased  Mood:  Anxious, Depressed and tearful, tired  Affect:  NA  Thought Process:  Descriptions of Associations: Intact  Orientation:  Full (Time, Place, and Person)  Thought Content:  Rumination  Suicidal Thoughts:  No  Homicidal Thoughts:  No  Memory:  Immediate;   Good Recent;   Good Remote;   Good  Judgement:  Intact  Insight:  Present  Psychomotor Activity:  NA  Concentration:  Concentration: Good and Attention Span: Good  Recall:  Good  Fund of Knowledge:  Good  Language:  Good  Akathisia:  No  Handed:  Right  AIMS (if indicated):     Assets:  Communication Skills Desire for Improvement Housing Resilience Social Support Talents/Skills Transportation  ADL's:  Intact  Cognition:  WNL  Sleep:   fair      Assessment and Plan: Major depressive disorder, recurrent.  Restless leg syndrome.  Anxiety.  Discussed her family situation in detail.  Patient is hoping that her brother agreed to go back to the methadone clinic.  She also mentioned that she does help all the family member and she need to be strong.  We did talk about referring to therapy and patient agreed and prompted that she will call her insurance to find out  which therapist is in her network.  I also recommend that she should give more time to BuSpar since she is not having any side effects  and it is too early.  However I also recommend she can try BuSpar 5 mg twice a day to target her anxiety.  I will continue Lamictal 150 mg twice a day, Wellbutrin XL 300 mg daily as higher dose she could not tolerate and Requip 1 mg at bedtime to help her restless leg.  I also reviewed her blood work results.  Discussed safety concern that anytime having active suicidal thoughts or homicidal thoughts then she need to call 911 or go to local emergency room.  Patient like to keep follow-up appointment in 3 months unless she has any question or any concern and she will call us early.  Follow-up in 3 months.  Time spent 25 minutes.  Follow Up Instructions:    I discussed the assessment and treatment plan with the patient. The patient was provided an opportunity to ask questions and all were answered. The patient agreed with the plan and demonstrated an understanding of the instructions.   The patient was advised to call back or seek an in-person evaluation if the symptoms worsen or if the condition fails to improve as anticipated.  I provided 25 minutes of non-face-to-face time during this encounter.   Kathlee Nations, MD

## 2019-08-27 DIAGNOSIS — E1065 Type 1 diabetes mellitus with hyperglycemia: Secondary | ICD-10-CM | POA: Diagnosis not present

## 2019-09-22 ENCOUNTER — Ambulatory Visit: Payer: Medicaid Other | Admitting: Endocrinology

## 2019-09-22 ENCOUNTER — Other Ambulatory Visit: Payer: Self-pay | Admitting: Endocrinology

## 2019-09-22 DIAGNOSIS — E1065 Type 1 diabetes mellitus with hyperglycemia: Secondary | ICD-10-CM

## 2019-09-28 ENCOUNTER — Telehealth: Payer: Self-pay

## 2019-09-28 DIAGNOSIS — E1065 Type 1 diabetes mellitus with hyperglycemia: Secondary | ICD-10-CM | POA: Diagnosis not present

## 2019-09-28 NOTE — Telephone Encounter (Signed)
Dr. Lucianne Muss was provided CMN from [facility] for CGM. Form was returned and he stated he could not complete at this time without an appt. My Chart message sent.

## 2019-10-14 ENCOUNTER — Other Ambulatory Visit: Payer: Self-pay | Admitting: Endocrinology

## 2019-10-28 DIAGNOSIS — E1065 Type 1 diabetes mellitus with hyperglycemia: Secondary | ICD-10-CM | POA: Diagnosis not present

## 2019-11-03 ENCOUNTER — Other Ambulatory Visit: Payer: Self-pay

## 2019-11-03 ENCOUNTER — Encounter (HOSPITAL_COMMUNITY): Payer: Self-pay

## 2019-11-03 ENCOUNTER — Ambulatory Visit (HOSPITAL_COMMUNITY)
Admission: EM | Admit: 2019-11-03 | Discharge: 2019-11-03 | Disposition: A | Discharge status: Home / Self Care | Payer: Medicaid Other | Attending: Physician Assistant | Admitting: Physician Assistant

## 2019-11-03 DIAGNOSIS — J069 Acute upper respiratory infection, unspecified: Secondary | ICD-10-CM | POA: Insufficient documentation

## 2019-11-03 DIAGNOSIS — Z20822 Contact with and (suspected) exposure to covid-19: Secondary | ICD-10-CM | POA: Insufficient documentation

## 2019-11-03 LAB — SARS CORONAVIRUS 2 (TAT 6-24 HRS): SARS Coronavirus 2: NEGATIVE

## 2019-11-03 MED ORDER — ACETAMINOPHEN 500 MG PO TABS
1000.0000 mg | ORAL_TABLET | Freq: Three times a day (TID) | ORAL | 0 refills | Status: DC | PRN
Start: 2019-11-03 — End: 2020-11-14

## 2019-11-03 MED ORDER — CEPACOL SORE THROAT 5.4 MG MT LOZG
1.0000 | LOZENGE | OROMUCOSAL | 0 refills | Status: DC | PRN
Start: 2019-11-03 — End: 2019-12-28

## 2019-11-03 MED ORDER — FLUTICASONE PROPIONATE 50 MCG/ACT NA SUSP
1.0000 | Freq: Every day | NASAL | 2 refills | Status: DC
Start: 2019-11-03 — End: 2019-12-28

## 2019-11-03 NOTE — ED Provider Notes (Signed)
[facility]    CSN: [id number] Arrival date & time: 11/03/19  9476      History   Chief Complaint Chief Complaint  Patient presents with   Headache   Nasal Congestion    HPI Sierra Schneider is a 34 y.o. female.   Patient presents with headache and nasal congestion.  Symptoms started 2 days ago.  She reports headache is not responded well to naproxen.  She does report it improves throughout the day and she is able to sleep.  Denies photophobia, nausea or vomiting.  Denies fever or chills.  Denies cough.  Endorses sore throat.  Has been eating and drinking.  No diarrhea or abdominal pain.  No change in taste or smell.  Has received 1 Covid vaccine.  Her son was first have symptoms on Friday.  He is here getting Covid testing today as well.     Past Medical History:  Diagnosis Date   Depression    Diabetes mellitus 08/2009   late onset type 1    Patient Active Problem List   Diagnosis Date Noted   Acute bilateral low back pain without sciatica 04/20/2019   Restless leg syndrome 07/31/2018   Acute non-recurrent maxillary sinusitis 02/14/2018   Annual physical exam 02/06/2018   Abdominal spasms 02/06/2018   Vaccine for diphtheria-tetanus-pertussis, combined 02/06/2018   IUD (intrauterine device) in place 04/10/2016   Insomnia 03/20/2016   Idiopathic edema 05/10/2015   Hidradenitis axillaris 03/16/2014   Generalized anxiety disorder 06/29/2013   Pure hypercholesterolemia 01/03/2013   Cough 06/13/2011   Depression 12/01/2010   Uncontrolled type 1 diabetes mellitus (HCC) 09/14/2009    Past Surgical History:  Procedure Laterality Date   CESAREAN SECTION     ESOPHAGOGASTRODUODENOSCOPY Left 10/14/2012   Procedure: ESOPHAGOGASTRODUODENOSCOPY (EGD);  Surgeon: Vertell Novak., MD;  Location: [facility] ENDOSCOPY;  Service: Endoscopy;  Laterality: Left;   WISDOM TOOTH EXTRACTION      OB History    Gravida  2   Para      Term      Preterm        AB  0   Living  2     SAB      TAB      Ectopic  0   Multiple      Live Births               Home Medications    Prior to Admission medications   Medication Sig Start Date End Date Taking? Authorizing Provider  acetaminophen (TYLENOL) 500 MG tablet Take 2 tablets (1,000 mg total) by mouth every 8 (eight) hours as needed. 11/03/19   Gaby Harney, Veryl Speak, PA-C  buPROPion (WELLBUTRIN XL) 300 MG 24 hr tablet Take 1 tablet (300 mg total) by mouth daily. 08/20/19   Arfeen, Phillips Grout, MD  busPIRone (BUSPAR) 5 MG tablet Take 1 tablet (5 mg total) by mouth 2 (two) times daily. 08/20/19   Arfeen, Phillips Grout, MD  fluticasone (FLONASE) 50 MCG/ACT nasal spray Place 1 spray into both nostrils daily. 11/03/19   Sherman Lipuma, Veryl Speak, PA-C  insulin lispro (HUMALOG) 100 UNIT/ML injection 10 ML VIALS USE MAX 80 UNITS DAILY VIA INSULIN PUMP. 09/23/19   Reather Littler, MD  Insulin Lispro-aabc (LYUMJEV) 100 UNIT/ML SOLN Inject 80 Units as directed daily. Use max of 80 units daily via insulin pump. 03/17/19   Reather Littler, MD  lamoTRIgine (LAMICTAL) 150 MG tablet Take 2 tablets (300 mg total) by mouth daily. 08/20/19  Arfeen, Phillips GroutSyed T, MD  meloxicam (MOBIC) 7.5 MG tablet Take 1 tablet (7.5 mg total) by mouth daily. 04/22/19   Lennox SoldersWinfrey, Amanda C, MD  Menthol (CEPACOL SORE THROAT) 5.4 MG LOZG Use as directed 1 lozenge (5.4 mg total) in the mouth or throat every 2 (two) hours as needed. 11/03/19   Xiana Carns, Veryl SpeakJacob E, PA-C  methocarbamol (ROBAXIN) 500 MG tablet Take 1 tablet (500 mg total) by mouth at bedtime as needed for muscle spasms. 04/20/19   Lennox SoldersWinfrey, Amanda C, MD  rOPINIRole (REQUIP) 1 MG tablet Take 1 tablet (1 mg total) by mouth at bedtime. 08/20/19   Arfeen, Phillips GroutSyed T, MD  rosuvastatin (CRESTOR) 20 MG tablet 1 tablet daily.  Make appointment for follow-up 10/14/19   Reather LittlerKumar, Ajay, MD  traZODone (DESYREL) 50 MG tablet Take 1 tablet (50 mg total) by mouth at bedtime as needed for sleep. Patient not taking: Reported on 08/20/2019 05/21/19    Arfeen, Phillips GroutSyed T, MD    Family History Family History  Problem Relation Age of Onset   Depression Mother    Hypertension Father    Hyperlipidemia Father    Kidney disease Paternal Grandmother    Diabetes Maternal Grandfather    ADD / ADHD Brother    Depression Brother    Drug abuse Brother    Depression Maternal Aunt    Depression Paternal Aunt    Psychosis Cousin    Heart disease Neg Hx     Social History Social History   Tobacco Use   Smoking status: Former Smoker    Packs/day: 0.00    Quit date: 01/26/2014    Years since quitting: 5.7   Smokeless tobacco: Never Used  Vaping Use   Vaping Use: Never used  Substance Use Topics   Alcohol use: Yes    Comment: 1 drink every 3 months   Drug use: No     Allergies   Patient has no known allergies.   Review of Systems Review of Systems   Physical Exam Triage Vital Signs ED Triage Vitals  Enc Vitals Group     BP 11/03/19 1018 110/75     Pulse Rate 11/03/19 1018 75     Resp 11/03/19 1018 16     Temp 11/03/19 1018 98 F (36.7 C)     Temp Source 11/03/19 1018 Oral     SpO2 11/03/19 1018 100 %     Weight --      Height --      Head Circumference --      Peak Flow --      Pain Score 11/03/19 1017 6     Pain Loc --      Pain Edu? --      Excl. in GC? --    No data found.  Updated Vital Signs BP 110/75 (BP Location: Right Arm)    Pulse 75    Temp 98 F (36.7 C) (Oral)    Resp 16    SpO2 100%   Visual Acuity Right Eye Distance:   Left Eye Distance:   Bilateral Distance:    Right Eye Near:   Left Eye Near:    Bilateral Near:     Physical Exam Vitals and nursing note reviewed.  Constitutional:      General: She is not in acute distress.    Appearance: Normal appearance. She is well-developed. She is not ill-appearing or diaphoretic.  HENT:     Head: Normocephalic and atraumatic.     Nose: Congestion  and rhinorrhea present.     Mouth/Throat:     Mouth: Mucous membranes are moist.      Pharynx: Oropharynx is clear.  Eyes:     Conjunctiva/sclera: Conjunctivae normal.  Cardiovascular:     Rate and Rhythm: Normal rate and regular rhythm.     Heart sounds: No murmur heard.   Pulmonary:     Effort: Pulmonary effort is normal. No respiratory distress.     Breath sounds: Normal breath sounds. No wheezing, rhonchi or rales.  Abdominal:     Palpations: Abdomen is soft.     Tenderness: There is no abdominal tenderness.  Musculoskeletal:     Cervical back: Neck supple.     Right lower leg: No edema.     Left lower leg: No edema.  Skin:    General: Skin is warm and dry.  Neurological:     General: No focal deficit present.     Mental Status: She is alert and oriented to person, place, and time.      UC Treatments / Results  Labs (all labs ordered are listed, but only abnormal results are displayed) Labs Reviewed  SARS CORONAVIRUS 2 (TAT 6-24 HRS)    EKG   Radiology No results found.  Procedures Procedures (including critical care time)  Medications Ordered in UC Medications - No data to display  Initial Impression / Assessment and Plan / UC Course  I have reviewed the triage vital signs and the nursing notes.  Pertinent labs & imaging results that were available during my care of the patient were reviewed by me and considered in my medical decision making (see chart for details).     Viral URI #Encounter for Covid test Patient 34 year old presenting with viral upper respiratory symptoms.  Normal vital signs, reassuring exam.  Recommend symptomatic care.  Discussed return, follow-up and emergency for precautions.  Covid test sent.  Patient verbalized understand plan of care Final Clinical Impressions(s) / UC Diagnoses   Final diagnoses:  Viral upper respiratory tract infection  Encounter for laboratory testing for COVID-19 virus     Discharge Instructions     Take medications as prescribed  If severe symptoms of shortness of breath, high  fevers or other concerning symptoms return or go to the emergency department  Follow-up with your primary care as needed  If your Covid-19 test is positive, you will receive a phone call from [facility] regarding your results. Negative test results are not called. Both positive and negative results area always visible on MyChart. If you do not have a MyChart account, sign up instructions are in your discharge papers.   Persons who are directed to care for themselves at home may discontinue isolation under the following conditions:   At least 10 days have passed since symptom onset and  At least 24 hours have passed without running a fever (this means without the use of fever-reducing medications) and  Other symptoms have improved.  Persons infected with COVID-19 who never develop symptoms may discontinue isolation and other precautions 10 days after the date of their first positive COVID-19 test.       ED Prescriptions    Medication Sig Dispense Auth. Provider   acetaminophen (TYLENOL) 500 MG tablet Take 2 tablets (1,000 mg total) by mouth every 8 (eight) hours as needed. 30 tablet Safir Michalec, Veryl Speak, PA-C   Menthol (CEPACOL SORE THROAT) 5.4 MG LOZG Use as directed 1 lozenge (5.4 mg total) in the mouth or throat every 2 (two)  hours as needed. 30 lozenge Katie Moch, Veryl Speak, PA-C   fluticasone (FLONASE) 50 MCG/ACT nasal spray Place 1 spray into both nostrils daily. 15.8 mL Bellarae Lizer, Veryl Speak, PA-C     PDMP not reviewed this encounter.   Hermelinda Medicus, PA-C 11/03/19 1058

## 2019-11-03 NOTE — Discharge Instructions (Signed)
Take medications as prescribed  If severe symptoms of shortness of breath, high fevers or other concerning symptoms return or go to the emergency department  Follow-up with your primary care as needed  If your Covid-19 test is positive, you will receive a phone call from [facility] regarding your results. Negative test results are not called. Both positive and negative results area always visible on MyChart. If you do not have a MyChart account, sign up instructions are in your discharge papers.   Persons who are directed to care for themselves at home may discontinue isolation under the following conditions:   At least 10 days have passed since symptom onset and  At least 24 hours have passed without running a fever (this means without the use of fever-reducing medications) and  Other symptoms have improved.  Persons infected with COVID-19 who never develop symptoms may discontinue isolation and other precautions 10 days after the date of their first positive COVID-19 test.  

## 2019-11-03 NOTE — ED Triage Notes (Signed)
Pt presents with nasal congestion and headache x 2 days, Aleve gives no relief. Denies fever, SOB, cough.

## 2019-11-04 ENCOUNTER — Telehealth: Payer: Self-pay | Admitting: *Deleted

## 2019-11-04 NOTE — Telephone Encounter (Signed)
Patient called [facility] to obtain COVID-19 test results.  Patient was informed that her test results from 11/03/2019 were negative for the COVID-19 virus.  Patient had not other questions or concerns.

## 2019-11-08 ENCOUNTER — Other Ambulatory Visit: Payer: Self-pay | Admitting: Endocrinology

## 2019-11-08 DIAGNOSIS — E1065 Type 1 diabetes mellitus with hyperglycemia: Secondary | ICD-10-CM

## 2019-11-12 ENCOUNTER — Other Ambulatory Visit (HOSPITAL_COMMUNITY): Payer: Self-pay | Admitting: Psychiatry

## 2019-11-12 DIAGNOSIS — F331 Major depressive disorder, recurrent, moderate: Secondary | ICD-10-CM

## 2019-11-12 DIAGNOSIS — G2581 Restless legs syndrome: Secondary | ICD-10-CM

## 2019-11-15 ENCOUNTER — Other Ambulatory Visit (HOSPITAL_COMMUNITY): Payer: Self-pay | Admitting: Psychiatry

## 2019-11-17 ENCOUNTER — Telehealth (HOSPITAL_COMMUNITY): Payer: Self-pay | Admitting: Psychiatry

## 2019-11-17 ENCOUNTER — Other Ambulatory Visit: Payer: Self-pay | Admitting: Endocrinology

## 2019-11-17 ENCOUNTER — Other Ambulatory Visit: Payer: Self-pay

## 2019-11-20 ENCOUNTER — Telehealth (HOSPITAL_COMMUNITY): Payer: Medicaid Other | Admitting: Psychiatry

## 2019-11-27 DIAGNOSIS — E1065 Type 1 diabetes mellitus with hyperglycemia: Secondary | ICD-10-CM | POA: Diagnosis not present

## 2019-12-15 DIAGNOSIS — Z20822 Contact with and (suspected) exposure to covid-19: Secondary | ICD-10-CM | POA: Diagnosis not present

## 2019-12-23 ENCOUNTER — Telehealth: Payer: Self-pay | Admitting: Endocrinology

## 2019-12-23 NOTE — Telephone Encounter (Signed)
She is supposed to be on Lyumjev not Humalog.  If we have a vial we can give it to her but she needs to make her appointment for follow-up which is long overdue, 30-minute visit

## 2019-12-23 NOTE — Telephone Encounter (Signed)
Pt stated-- switched her to Rx Humalog and gave sample last visit. Pt stated medication working pretty well  and called the insurance for replacement.

## 2019-12-23 NOTE — Telephone Encounter (Signed)
Please advise below message 

## 2019-12-23 NOTE — Telephone Encounter (Signed)
Patient called stating she dropped her Humalog on the floor and did not know if we had samples? She stated this has happened to her before. Please give pt a call back at [phone]

## 2019-12-23 NOTE — Telephone Encounter (Signed)
Please schedule follow-up, 30-minute with labs

## 2019-12-24 NOTE — Telephone Encounter (Signed)
Pt requested have lab appt before seeing Dr Lucianne Muss. Appt st up 01/06/20 @ 3:30.

## 2019-12-25 ENCOUNTER — Other Ambulatory Visit: Payer: Self-pay | Admitting: Endocrinology

## 2019-12-25 DIAGNOSIS — E1065 Type 1 diabetes mellitus with hyperglycemia: Secondary | ICD-10-CM

## 2019-12-28 ENCOUNTER — Other Ambulatory Visit: Payer: Self-pay

## 2019-12-28 ENCOUNTER — Ambulatory Visit
Admission: EM | Admit: 2019-12-28 | Discharge: 2019-12-28 | Disposition: A | Discharge status: Home / Self Care | Payer: Medicaid Other

## 2019-12-28 DIAGNOSIS — E1065 Type 1 diabetes mellitus with hyperglycemia: Secondary | ICD-10-CM | POA: Diagnosis not present

## 2019-12-28 DIAGNOSIS — Z7689 Persons encountering health services in other specified circumstances: Secondary | ICD-10-CM

## 2019-12-28 DIAGNOSIS — Z8616 Personal history of COVID-19: Secondary | ICD-10-CM

## 2019-12-28 DIAGNOSIS — Z20822 Contact with and (suspected) exposure to covid-19: Secondary | ICD-10-CM | POA: Diagnosis not present

## 2019-12-28 NOTE — ED Provider Notes (Signed)
[facility]    CSN: [id number] Arrival date & time: 12/28/19  1100      History   Chief Complaint Chief Complaint  Patient presents with  . Letter for School/Work    asymptomatic    HPI Sierra Schneider is a 34 y.o. female  Presenting for return to work note.  Had Covid exposure 2 weeks ago, tested positive [facility] on 10/19.  States that she had a mild case: Sore throat for a few days and fatigue.  No cough, difficulty breathing, chest pain or fever.  Did have first Covid vaccine a few weeks prior.  Currently asymptomatic.    Past Medical History:  Diagnosis Date  . Depression   . Diabetes mellitus 08/2009   late onset type 1    Patient Active Problem List   Diagnosis Date Noted  . Acute bilateral low back pain without sciatica 04/20/2019  . Restless leg syndrome 07/31/2018  . Acute non-recurrent maxillary sinusitis 02/14/2018  . Annual physical exam 02/06/2018  . Abdominal spasms 02/06/2018  . Vaccine for diphtheria-tetanus-pertussis, combined 02/06/2018  . IUD (intrauterine device) in place 04/10/2016  . Insomnia 03/20/2016  . Idiopathic edema 05/10/2015  . Hidradenitis axillaris 03/16/2014  . Generalized anxiety disorder 06/29/2013  . Pure hypercholesterolemia 01/03/2013  . Cough 06/13/2011  . Depression 12/01/2010  . Uncontrolled type 1 diabetes mellitus (HCC) 09/14/2009    Past Surgical History:  Procedure Laterality Date  . CESAREAN SECTION    . ESOPHAGOGASTRODUODENOSCOPY Left 10/14/2012   Procedure: ESOPHAGOGASTRODUODENOSCOPY (EGD);  Surgeon: Vertell Novak., MD;  Location: [facility] ENDOSCOPY;  Service: Endoscopy;  Laterality: Left;  . WISDOM TOOTH EXTRACTION      OB History    Gravida  2   Para      Term      Preterm      AB  0   Living  2     SAB      TAB      Ectopic  0   Multiple      Live Births               Home Medications    Prior to Admission medications   Medication Sig Start Date End Date Taking?  Authorizing Provider  acetaminophen (TYLENOL) 500 MG tablet Take 2 tablets (1,000 mg total) by mouth every 8 (eight) hours as needed. 11/03/19  Yes Darr, Gerilyn Pilgrim, PA-C  buPROPion (WELLBUTRIN XL) 300 MG 24 hr tablet Take 1 tablet (300 mg total) by mouth daily. 08/20/19  Yes Arfeen, Phillips Grout, MD  insulin lispro (HUMALOG) 100 UNIT/ML injection 10 ML VIALS USE MAX 80 UNITS DAILY VIA INSULIN PUMP. 11/09/19  Yes Reather Littler, MD  lamoTRIgine (LAMICTAL) 150 MG tablet Take 2 tablets (300 mg total) by mouth daily. 08/20/19  Yes Arfeen, Phillips Grout, MD  rOPINIRole (REQUIP) 1 MG tablet Take 1 tablet (1 mg total) by mouth at bedtime. 08/20/19  Yes Arfeen, Phillips Grout, MD  rosuvastatin (CRESTOR) 20 MG tablet TAKE 1 TABLET BY MOUTH DAILY. MAKE APPOINTMENT FOR FOLLOW-UP 11/17/19  Yes Reather Littler, MD  busPIRone (BUSPAR) 5 MG tablet Take 1 tablet (5 mg total) by mouth 2 (two) times daily. 08/20/19   Arfeen, Phillips Grout, MD  Insulin Lispro-aabc (LYUMJEV) 100 UNIT/ML SOLN Inject 80 Units as directed daily. Use max of 80 units daily via insulin pump. 03/17/19   Reather Littler, MD  traZODone (DESYREL) 50 MG tablet Take 1 tablet (50 mg total) by mouth at bedtime  as needed for sleep. Patient not taking: Reported on 08/20/2019 05/21/19   Arfeen, Phillips Grout, MD  fluticasone (FLONASE) 50 MCG/ACT nasal spray Place 1 spray into both nostrils daily. 11/03/19 12/28/19  Darr, Gerilyn Pilgrim, PA-C    Family History Family History  Problem Relation Age of Onset  . Depression Mother   . Hypertension Father   . Hyperlipidemia Father   . Kidney disease Paternal Grandmother   . Diabetes Maternal Grandfather   . ADD / ADHD Brother   . Depression Brother   . Drug abuse Brother   . Depression Maternal Aunt   . Depression Paternal Aunt   . Psychosis Cousin   . Heart disease Neg Hx     Social History Social History   Tobacco Use  . Smoking status: Former Smoker    Packs/day: 0.00    Quit date: 01/26/2014    Years since quitting: 5.9  . Smokeless tobacco: Never Used    Vaping Use  . Vaping Use: Never used  Substance Use Topics  . Alcohol use: Yes    Comment: 1 drink every 3 months  . Drug use: No     Allergies   Patient has no known allergies.   Review of Systems Review of Systems  Constitutional: Negative for fatigue and fever.  HENT: Negative for ear pain, sinus pain, sore throat and voice change.   Eyes: Negative for pain, redness and visual disturbance.  Respiratory: Negative for cough and shortness of breath.   Cardiovascular: Negative for chest pain and palpitations.  Gastrointestinal: Negative for abdominal pain, diarrhea and vomiting.  Musculoskeletal: Negative for arthralgias and myalgias.  Skin: Negative for rash and wound.  Neurological: Negative for syncope and headaches.     Physical Exam Triage Vital Signs ED Triage Vitals  Enc Vitals Group     BP 12/28/19 1107 127/76     Pulse Rate 12/28/19 1107 95     Resp 12/28/19 1107 18     Temp 12/28/19 1107 98.4 F (36.9 C)     Temp Source 12/28/19 1107 Oral     SpO2 12/28/19 1107 97 %     Weight --      Height --      Head Circumference --      Peak Flow --      Pain Score 12/28/19 1109 0     Pain Loc --      Pain Edu? --      Excl. in GC? --    No data found.  Updated Vital Signs BP 127/76 (BP Location: Left Arm)   Pulse 95   Temp 98.4 F (36.9 C) (Oral)   Resp 18   SpO2 97%   Visual Acuity Right Eye Distance:   Left Eye Distance:   Bilateral Distance:    Right Eye Near:   Left Eye Near:    Bilateral Near:     Physical Exam Constitutional:      General: She is not in acute distress. HENT:     Head: Normocephalic and atraumatic.  Eyes:     General: No scleral icterus.    Pupils: Pupils are equal, round, and reactive to light.  Cardiovascular:     Rate and Rhythm: Normal rate.  Pulmonary:     Effort: Pulmonary effort is normal.  Skin:    Coloration: Skin is not jaundiced or pale.  Neurological:     Mental Status: She is alert and oriented to  person, place, and time.  UC Treatments / Results  Labs (all labs ordered are listed, but only abnormal results are displayed) Labs Reviewed - No data to display  EKG   Radiology No results found.  Procedures Procedures (including critical care time)  Medications Ordered in UC Medications - No data to display  Initial Impression / Assessment and Plan / UC Course  I have reviewed the triage vital signs and the nursing notes.  Pertinent labs & imaging results that were available during my care of the patient were reviewed by me and considered in my medical decision making (see chart for details).     Afebrile, nontoxic in office today.  Has completed 14-day quarantine and is asymptomatic Per chart review, [facility] SARS positive 12/15/19.  Work note provided. Final Clinical Impressions(s) / UC Diagnoses   Final diagnoses:  Return to work evaluation  Personal history of COVID-19   Discharge Instructions   None    ED Prescriptions    None     PDMP not reviewed this encounter.   Hall[place]Potvin, [place], [place] 12/28/19 1147

## 2019-12-28 NOTE — ED Triage Notes (Signed)
Pt states she has no symptoms of covid at this time but did have a positive test and was symptomatic. Pt had a positive test on Thursday. Pt is aox4 and ambulatory.

## 2019-12-30 ENCOUNTER — Other Ambulatory Visit: Payer: Medicaid Other

## 2020-01-04 ENCOUNTER — Other Ambulatory Visit (INDEPENDENT_AMBULATORY_CARE_PROVIDER_SITE_OTHER): Payer: Medicaid Other

## 2020-01-04 ENCOUNTER — Other Ambulatory Visit: Payer: Self-pay

## 2020-01-04 DIAGNOSIS — E1065 Type 1 diabetes mellitus with hyperglycemia: Secondary | ICD-10-CM

## 2020-01-04 LAB — GLUCOSE, RANDOM: Glucose, Bld: 211 mg/dL — ABNORMAL HIGH (ref 70–99)

## 2020-01-04 LAB — MICROALBUMIN / CREATININE URINE RATIO
Creatinine,U: 131.4 mg/dL
Microalb Creat Ratio: 0.8 mg/g (ref 0.0–30.0)
Microalb, Ur: 1.1 mg/dL (ref 0.0–1.9)

## 2020-01-04 LAB — HEMOGLOBIN A1C: Hgb A1c MFr Bld: 7.6 % — ABNORMAL HIGH (ref 4.6–6.5)

## 2020-01-05 ENCOUNTER — Other Ambulatory Visit (HOSPITAL_COMMUNITY): Payer: Self-pay | Admitting: Psychiatry

## 2020-01-05 ENCOUNTER — Other Ambulatory Visit: Payer: Self-pay | Admitting: Endocrinology

## 2020-01-05 DIAGNOSIS — E1065 Type 1 diabetes mellitus with hyperglycemia: Secondary | ICD-10-CM

## 2020-01-05 DIAGNOSIS — G2581 Restless legs syndrome: Secondary | ICD-10-CM

## 2020-01-06 ENCOUNTER — Other Ambulatory Visit: Payer: Medicaid Other

## 2020-01-11 ENCOUNTER — Ambulatory Visit (INDEPENDENT_AMBULATORY_CARE_PROVIDER_SITE_OTHER): Payer: Medicaid Other | Admitting: Endocrinology

## 2020-01-11 ENCOUNTER — Encounter: Payer: Self-pay | Admitting: Endocrinology

## 2020-01-11 ENCOUNTER — Other Ambulatory Visit: Payer: Self-pay

## 2020-01-11 VITALS — BP 120/72 | HR 88 | Ht 64.0 in | Wt 152.8 lb

## 2020-01-11 DIAGNOSIS — E1065 Type 1 diabetes mellitus with hyperglycemia: Secondary | ICD-10-CM | POA: Diagnosis not present

## 2020-01-11 DIAGNOSIS — Z23 Encounter for immunization: Secondary | ICD-10-CM

## 2020-01-11 DIAGNOSIS — E7800 Pure hypercholesterolemia, unspecified: Secondary | ICD-10-CM

## 2020-01-11 NOTE — Patient Instructions (Signed)
Bolus 10 min before mealks 1-2 U for coffee  Add Protein for Bfst

## 2020-01-11 NOTE — Progress Notes (Signed)
Patient ID: Sierra Schneider, female   DOB: 1985-07-23, 34 y.o.   MRN: [id number]   Reason for Appointment: Diabetes followup:   History of Present Illness   Diagnosis: Type 1 DIABETES MELITUS     DIABETES history: She has had diabetes since about 2011 and has been on insulin pump for several years She had fairly good control in the first 2 years of her disease but blood sugar control had been more difficult subsequently  CURRENT insulin pump:  Medtronic 770 G CURRENT insulin: Lyumjev since 1/21  SETTINGS are:    Basal rate on current download = Midnight = 1.3, 7 AM = 1.4, 10 AM = 0.6, 12 noon = 1.3, 6 PM = 1.05, 7 PM = 0.9, 10 PM = 1.0  Total basal insulin = about 24 units Carb Ratio: 1:9.  1: 14 at 5 PM Correction factor 1: 30, target 100 Active insulin time 3 hours    Current management, blood sugar patterns and problems identified:  Her A1c has been about the same around 7% and now 7.6  Pump data:  She has been in the AUTO mode 83 % of the time Reasons for exiting auto mode are high glucose most often, max delivery and twice no calibration  CGM use % of time  87  2-week average/SD  166+/-63  Time in range      67% was 61  % Time Above 180  19  % Time above 250  13  % Time Below 70  1     PRE-MEAL Fasting Lunch Dinner Bedtime Overall  Glucose range:  124-192      Averages: 149  235  178  224/194    POST-MEAL PC Breakfast PC Lunch PC Dinner  Glucose range:     Averages:  155  175  189   Previous data:  PRE-MEAL  AC breakfast Lunch Dinner Bedtime Overall  Glucose range:    97-330    Mean/median:  158  150  197     POST-MEAL PC Breakfast PC Lunch PC Dinner  Glucose range:     Mean/median:  112  183  189     BLOOD SUGAR PATTERNS, management and problems:   She was last seen in 05/2019  She is back on Humalog which she thinks works better than Lyumjev which was not lasting long enough  However with bolusing right before eating or after  starting to eat her blood sugars frequently will tend to be high  She may be getting overcorrection of high readings especially if she tries to correct high postprandial readings  She has done exercise a couple of times a week at the gym which does not appear to cause low sugars  Blood sugar patterns from CGM analysis show the following:  In the mornings now her blood sugars tend to be much higher After her late morning breakfast because of eating junk food like chips or carbohydrate rich snacks like rice crispies treats; also occasionally on weekends will have cereal which will make her sugar go up  OVERNIGHT blood sugars are fairly stable  She has HYPERGLYCEMIA mostly between 11 AM-2 PM which is before lunch and after her first meal  POSTPRANDIAL readings after lunch and dinner are generally fairly well controlled although she has more variability in the evenings  Preprandial readings are not as high in the morning although slight increase in blood sugars with drinking her coffee in the morning  Hypoglycemia has occurred mostly  sporadically at different times, recently around 2 PM possibly from overcorrection      Wt Readings from Last 3 Encounters:  01/11/20 152 lb 12.8 oz (69.3 kg)  06/23/19 148 lb 6.4 oz (67.3 kg)  04/20/19 145 lb (65.8 kg)     LABS:  Lab Results  Component Value Date   HGBA1C 7.6 (H) 01/04/2020   HGBA1C 7.1 (H) 06/02/2019   HGBA1C 7.9 (H) 01/12/2019   Lab Results  Component Value Date   MICROALBUR 1.1 01/04/2020   LDLCALC 77 06/02/2019   CREATININE 0.68 06/02/2019    OTHER active problems discussed today: See review of systems    Allergies as of 01/11/2020   No Known Allergies     Medication List       Accurate as of January 11, 2020  4:46 PM. If you have any questions, ask your nurse or doctor.        acetaminophen 500 MG tablet Commonly known as: TYLENOL Take 2 tablets (1,000 mg total) by mouth every 8 (eight) hours as needed.    buPROPion 300 MG 24 hr tablet Commonly known as: WELLBUTRIN XL Take 1 tablet (300 mg total) by mouth daily.   busPIRone 5 MG tablet Commonly known as: BUSPAR Take 1 tablet (5 mg total) by mouth 2 (two) times daily.   insulin lispro 100 UNIT/ML injection Commonly known as: HumaLOG 10 ML VIALS USE MAX 80 UNITS DAILY VIA INSULIN PUMP.   lamoTRIgine 150 MG tablet Commonly known as: LAMICTAL Take 2 tablets (300 mg total) by mouth daily.   Lyumjev 100 UNIT/ML Soln Generic drug: Insulin Lispro-aabc Inject 80 Units as directed daily. Use max of 80 units daily via insulin pump.   rOPINIRole 1 MG tablet Commonly known as: REQUIP Take 1 tablet (1 mg total) by mouth at bedtime.   rosuvastatin 20 MG tablet Commonly known as: CRESTOR TAKE 1 TABLET BY MOUTH DAILY. MAKE APPOINTMENT FOR FOLLOW-UP   traZODone 50 MG tablet Commonly known as: DESYREL Take 1 tablet (50 mg total) by mouth at bedtime as needed for sleep.       Allergies: No Known Allergies  Past Medical History:  Diagnosis Date  . Depression   . Diabetes mellitus 08/2009   late onset type 1    Past Surgical History:  Procedure Laterality Date  . CESAREAN SECTION    . ESOPHAGOGASTRODUODENOSCOPY Left 10/14/2012   Procedure: ESOPHAGOGASTRODUODENOSCOPY (EGD);  Surgeon: Vertell Novak., MD;  Location: [facility] ENDOSCOPY;  Service: Endoscopy;  Laterality: Left;  . WISDOM TOOTH EXTRACTION      Family History  Problem Relation Age of Onset  . Depression Mother   . Hypertension Father   . Hyperlipidemia Father   . Kidney disease Paternal Grandmother   . Diabetes Maternal Grandfather   . ADD / ADHD Brother   . Depression Brother   . Drug abuse Brother   . Depression Maternal Aunt   . Depression Paternal Aunt   . Psychosis Cousin   . Heart disease Neg Hx     Social History:  reports that she quit smoking about 5 years ago. She smoked 0.00 packs per day. She has never used smokeless tobacco. She reports current  alcohol use. She reports that she does not use drugs.  REVIEW of systems:   She has had  HYPERCHOLESTEROLEMIA,  Because of her family history of hypercholesterolemia and her diabetes she was previously on pravastatin, now on Crestor 20 mg with baseline of LDL of 146  LDL  well controlled as follows although slightly higher than before  Lab Results  Component Value Date   CHOL 148 06/02/2019   CHOL 138 09/10/2018   CHOL 125 09/09/2017   Lab Results  Component Value Date   HDL 55.60 06/02/2019   HDL 52.80 09/10/2018   HDL 50.70 09/09/2017   Lab Results  Component Value Date   LDLCALC 77 06/02/2019   LDLCALC 69 09/10/2018   LDLCALC 63 09/09/2017    Lab Results  Component Value Date   TRIG 75.0 06/02/2019   TRIG 81.0 09/10/2018   TRIG 61.0 09/09/2017   Lab Results  Component Value Date   CHOLHDL 3 06/02/2019   CHOLHDL 3 09/10/2018   CHOLHDL 2 09/09/2017   Lab Results  Component Value Date   LDLDIRECT 129.4 12/29/2012    She had mild respiratory symptoms from Covid infection in October 2021, is waiting for her second Pfizer vaccine    EXAM:  BP 120/72   Pulse 88   Ht 5\' 4"  (1.626 m)   Wt 152 lb 12.8 oz (69.3 kg)   SpO2 97%   BMI 26.23 kg/m    ASSESSMENT:    Type I diabetes on Medtronic 770 pump  See history of present illness for detailed discussion of  current management, blood sugar patterns and problems identified  Her A1c is still relatively high at 7.6   As discussed above in the interpretation of her CGM download that she has difficulty with postprandial hyperglycemia especially in the mornings This is likely related to eating unbalanced carbohydrates including cereals Also appears that her Humalog is relatively slow acting and needs to be bolused a few minutes before eating instead of at the mealtime or at after  No consistent trend for postprandial hypoglycemia in the afternoons or evenings Hypoglycemia occurs mostly when she is tried to make  corrections for high readings after meals She has also gained weight  Microalbumin normal  Recommendations:  She will need to bolus ahead of time when she is eating a meal especially breakfast Continue Humalog Make sure she calibrates 3 times a day especially breakfast and bedtime Reduce correction dose when she is trying to bolus for high postprandial readings Carbohydrate coverage 1: 7 for breakfast instead of 1:  Correction factor I: 35 instead of 1: 30  Call if having unusually high or low readings  LIPIDS: Well controlled previously and will need follow-up labs  Flu vaccine administered Patient information given   Recommended that she go ahead and take her second Covid vaccine  Patient Instructions  Bolus 10 min before mealks 1-2 U for coffee  Add Protein for Bfst       01/11/2020, 4:46 PM

## 2020-01-19 ENCOUNTER — Other Ambulatory Visit: Payer: Self-pay | Admitting: Endocrinology

## 2020-01-19 DIAGNOSIS — E1065 Type 1 diabetes mellitus with hyperglycemia: Secondary | ICD-10-CM

## 2020-01-25 ENCOUNTER — Other Ambulatory Visit (HOSPITAL_COMMUNITY): Payer: Self-pay | Admitting: Psychiatry

## 2020-01-25 DIAGNOSIS — G2581 Restless legs syndrome: Secondary | ICD-10-CM

## 2020-01-27 DIAGNOSIS — E1065 Type 1 diabetes mellitus with hyperglycemia: Secondary | ICD-10-CM | POA: Diagnosis not present

## 2020-02-29 DIAGNOSIS — E1065 Type 1 diabetes mellitus with hyperglycemia: Secondary | ICD-10-CM | POA: Diagnosis not present

## 2020-03-01 ENCOUNTER — Other Ambulatory Visit (HOSPITAL_COMMUNITY): Payer: Self-pay | Admitting: Psychiatry

## 2020-03-01 DIAGNOSIS — F419 Anxiety disorder, unspecified: Secondary | ICD-10-CM

## 2020-03-01 DIAGNOSIS — G2581 Restless legs syndrome: Secondary | ICD-10-CM

## 2020-03-01 DIAGNOSIS — F331 Major depressive disorder, recurrent, moderate: Secondary | ICD-10-CM

## 2020-03-20 ENCOUNTER — Other Ambulatory Visit: Payer: Self-pay | Admitting: Endocrinology

## 2020-03-30 DIAGNOSIS — E1065 Type 1 diabetes mellitus with hyperglycemia: Secondary | ICD-10-CM | POA: Diagnosis not present

## 2020-04-29 DIAGNOSIS — E1065 Type 1 diabetes mellitus with hyperglycemia: Secondary | ICD-10-CM | POA: Diagnosis not present

## 2020-05-05 ENCOUNTER — Other Ambulatory Visit: Payer: Self-pay | Admitting: Endocrinology

## 2020-05-05 DIAGNOSIS — E1065 Type 1 diabetes mellitus with hyperglycemia: Secondary | ICD-10-CM

## 2020-05-10 ENCOUNTER — Ambulatory Visit (INDEPENDENT_AMBULATORY_CARE_PROVIDER_SITE_OTHER): Payer: Medicaid Other | Admitting: Endocrinology

## 2020-05-10 ENCOUNTER — Other Ambulatory Visit: Payer: Self-pay

## 2020-05-10 ENCOUNTER — Encounter: Payer: Self-pay | Admitting: Endocrinology

## 2020-05-10 VITALS — BP 114/78 | HR 82 | Resp 18 | Ht 64.0 in | Wt 154.8 lb

## 2020-05-10 DIAGNOSIS — E7800 Pure hypercholesterolemia, unspecified: Secondary | ICD-10-CM | POA: Diagnosis not present

## 2020-05-10 DIAGNOSIS — E1065 Type 1 diabetes mellitus with hyperglycemia: Secondary | ICD-10-CM | POA: Diagnosis not present

## 2020-05-10 LAB — POCT GLYCOSYLATED HEMOGLOBIN (HGB A1C): Hemoglobin A1C: 6.9 % — AB (ref 4.0–5.6)

## 2020-05-10 LAB — GLUCOSE, POCT (MANUAL RESULT ENTRY): POC Glucose: 103 mg/dl — AB (ref 70–99)

## 2020-05-10 NOTE — Progress Notes (Signed)
Patient ID: Sierra Schneider, female   DOB: Jul 04, 1985, 35 y.o.   MRN: [id number]   Reason for Appointment: Diabetes followup:   History of Present Illness   Diagnosis: Type 1 DIABETES MELITUS     DIABETES history: She has had diabetes since about 2011 and has been on insulin pump for several years She had fairly good control in the first 2 years of her disease but blood sugar control had been more difficult subsequently  CURRENT insulin pump:  Medtronic 770 G  CURRENT insulin: HUMALOG  SETTINGS are:    Basal rate on current download = Midnight = 1.3, 7 AM = 1.4, 10 AM = 0.6, 12 noon = 1.3, 6 PM = 1.05, 7 PM = 0.9, 10 PM = 1.0  Total basal insulin = about 24 units Carb Ratio: 1:9.  1: 14 at 5 PM Correction factor 1: 30, target 100 Active insulin time 3 hours    Current management, blood sugar patterns and problems identified:  Her A1c has been over 7 previously but now 6.9  Pump data:  She has been in the AUTO mode 82 % of the time Reasons for exiting auto mode are high glucose most often, max delivery and twice no calibration Recent daily insulin dose 48 units with 56% in basal   BLOOD SUGAR PATTERNS, management and problems:   She has about the same average blood sugar is on the last visit but her A1c is better than usual  She is gradually gaining weight  However blood sugars are not consistently normal some days very well controlled  Likely she may have late or missed boluses occasionally causing high sugars  Otherwise postprandial reading may be variable depending on composition of her meal especially when eating out  Usually having no issues with her sensory function and generally trying to calibrate up to 4 times a day; has on occasion discrepant readings with the sensor and fingerstick  Still exercising couple of times a week at the gym which does not appear to cause low sugars and she did not make any adjustments for this  Blood sugar patterns  from CGM analysis show the following:   Her blood sugars are generally well controlled overnight but tend to be higher in the afternoons and evenings but with significant variability  Although his postprandial readings are highly variable after lunch and dinner even though on average they are relatively flat  Appears to have high readings on an average before her meals usually at lunch and dinner  Usually she is showing a gradual rise in her blood sugar in the morning hours even without any boluses or carbohydrate intake  Hypoglycemia has been minimal and only occasionally related to over bolusing  Episode of persistent hypoglycemia seen last week likely related to infusion set malfunction  CGM use % of time  81  2-week average/GV  158/55  Time in range      68% versus 67  % Time Above 180  23  % Time above 250 7  % Time Below 70 2     PRE-MEAL Fasting Lunch Dinner Bedtime Overall  Glucose range:       Averages:  155  176  193     POST-MEAL PC Breakfast PC Lunch PC Dinner  Glucose range:     Averages:  178  178  182   Previous data: ' CGM use % of time  87  2-week average/SD  166+/-63  Time in range  67% was 61  % Time Above 180  19  % Time above 250  13  % Time Below 70  1     PRE-MEAL Fasting Lunch Dinner Bedtime Overall  Glucose range:  124-192      Averages: 149  235  178  224/194    POST-MEAL PC Breakfast PC Lunch PC Dinner  Glucose range:     Averages:  155  175  189        Wt Readings from Last 3 Encounters:  05/10/20 154 lb 12.8 oz (70.2 kg)  01/11/20 152 lb 12.8 oz (69.3 kg)  06/23/19 148 lb 6.4 oz (67.3 kg)     LABS:  Lab Results  Component Value Date   HGBA1C 6.9 (A) 05/10/2020   HGBA1C 7.6 (H) 01/04/2020   HGBA1C 7.1 (H) 06/02/2019   Lab Results  Component Value Date   MICROALBUR 1.1 01/04/2020   LDLCALC 77 06/02/2019   CREATININE 0.68 06/02/2019    OTHER active problems discussed today: See review of systems    Allergies  as of 05/10/2020   No Known Allergies     Medication List       Accurate as of May 10, 2020  4:36 PM. If you have any questions, ask your nurse or doctor.        acetaminophen 500 MG tablet Commonly known as: TYLENOL Take 2 tablets (1,000 mg total) by mouth every 8 (eight) hours as needed.   buPROPion 300 MG 24 hr tablet Commonly known as: WELLBUTRIN XL Take 1 tablet (300 mg total) by mouth daily.   busPIRone 5 MG tablet Commonly known as: BUSPAR Take 1 tablet (5 mg total) by mouth 2 (two) times daily.   insulin lispro 100 UNIT/ML injection Commonly known as: HumaLOG USE MAX 80 UNITS DAILY VIA INSULIN PUMP.   lamoTRIgine 150 MG tablet Commonly known as: LAMICTAL Take 2 tablets (300 mg total) by mouth daily.   rOPINIRole 1 MG tablet Commonly known as: REQUIP Take 1 tablet (1 mg total) by mouth at bedtime.   rosuvastatin 20 MG tablet Commonly known as: CRESTOR TAKE 1 TABLET BY MOUTH DAILY. MAKE APPOINTMENT FOR FOLLOW-UP   traZODone 50 MG tablet Commonly known as: DESYREL Take 1 tablet (50 mg total) by mouth at bedtime as needed for sleep.       Allergies: No Known Allergies  Past Medical History:  Diagnosis Date  . Depression   . Diabetes mellitus 08/2009   late onset type 1    Past Surgical History:  Procedure Laterality Date  . CESAREAN SECTION    . ESOPHAGOGASTRODUODENOSCOPY Left 10/14/2012   Procedure: ESOPHAGOGASTRODUODENOSCOPY (EGD);  Surgeon: Vertell Novak., MD;  Location: [facility] ENDOSCOPY;  Service: Endoscopy;  Laterality: Left;  . WISDOM TOOTH EXTRACTION      Family History  Problem Relation Age of Onset  . Depression Mother   . Hypertension Father   . Hyperlipidemia Father   . Kidney disease Paternal Grandmother   . Diabetes Maternal Grandfather   . ADD / ADHD Brother   . Depression Brother   . Drug abuse Brother   . Depression Maternal Aunt   . Depression Paternal Aunt   . Psychosis Cousin   . Heart disease Neg Hx     Social  History:  reports that she quit smoking about 6 years ago. She smoked 0.00 packs per day. She has never used smokeless tobacco. She reports current alcohol use. She reports that she does not use  drugs.  REVIEW of systems:   HYPERCHOLESTEROLEMIA,  Because of her family history of hypercholesterolemia and her diabetes she was previously on pravastatin, now on Crestor 20 mg with baseline of LDL of 146  LDL well controlled as follows  Lab Results  Component Value Date   CHOL 148 06/02/2019   CHOL 138 09/10/2018   CHOL 125 09/09/2017   Lab Results  Component Value Date   HDL 55.60 06/02/2019   HDL 52.80 09/10/2018   HDL 50.70 09/09/2017   Lab Results  Component Value Date   LDLCALC 77 06/02/2019   LDLCALC 69 09/10/2018   LDLCALC 63 09/09/2017    Lab Results  Component Value Date   TRIG 75.0 06/02/2019   TRIG 81.0 09/10/2018   TRIG 61.0 09/09/2017   Lab Results  Component Value Date   CHOLHDL 3 06/02/2019   CHOLHDL 3 09/10/2018   CHOLHDL 2 09/09/2017   Lab Results  Component Value Date   LDLDIRECT 129.4 12/29/2012    She had mild respiratory symptoms from Covid infection in October 2021 She has had her second Covid shot    EXAM:  BP 114/78 (BP Location: Left Arm, Patient Position: Sitting, Cuff Size: Normal)   Pulse 82   Resp 18   Ht 5\' 4"  (1.626 m)   Wt 154 lb 12.8 oz (70.2 kg)   SpO2 99%   BMI 26.57 kg/m    ASSESSMENT:    Type I diabetes on Medtronic 770 pump  See history of present illness for detailed discussion of  current management, blood sugar patterns and problems identified  Her A1c is improved at 6.9, previously was relatively high at 7.6   More recently she is getting 68% of her readings within target without excessive hypoglycemia Overall blood sugars are variable and higher in the late afternoon and evenings Postprandial readings are also somewhat inconsistent but on an average not getting excessively high or low She is also generally  trying to improve her diet overall Timing of boluses generally fairly appropriate No unexpected hypoglycemia and only occasionally with over bolusing    Recommendations:  Continue to time bolus ahead of meals Supplemental boluses when she is eating any higher fat meals or follow-up bolus if eating additional carbohydrates  Continue Humalog Make sure she calibrates when needed To enable auto mode as much as possible No change in carbohydrate coverage settings Since her manual basal rate is giving her rather more insulin she will reduce her basal rate in the morning at 7 AM 91.2 and at 6 PM down to 0.8 She can keep an infusion set for her at work in case she has unexpected persistent hyperglycemia More regular exercise She can try and cut back on overall carbohydrate intake to help with some weight loss  LIPIDS: Well controlled previously and will need follow-up labs today   There are no Patient Instructions on file for this visit.      05/10/2020, 4:36 PM

## 2020-05-12 ENCOUNTER — Telehealth (INDEPENDENT_AMBULATORY_CARE_PROVIDER_SITE_OTHER): Payer: Medicaid Other | Admitting: Psychiatry

## 2020-05-12 ENCOUNTER — Other Ambulatory Visit: Payer: Self-pay

## 2020-05-12 ENCOUNTER — Encounter (HOSPITAL_COMMUNITY): Payer: Self-pay | Admitting: Psychiatry

## 2020-05-12 DIAGNOSIS — G2581 Restless legs syndrome: Secondary | ICD-10-CM | POA: Diagnosis not present

## 2020-05-12 DIAGNOSIS — F419 Anxiety disorder, unspecified: Secondary | ICD-10-CM | POA: Diagnosis not present

## 2020-05-12 DIAGNOSIS — F331 Major depressive disorder, recurrent, moderate: Secondary | ICD-10-CM | POA: Diagnosis not present

## 2020-05-12 MED ORDER — ROPINIROLE HCL 1 MG PO TABS: 1.0000 mg | ORAL_TABLET | Freq: Every day | ORAL | 0 refills | Status: DC

## 2020-05-12 MED ORDER — LAMOTRIGINE 150 MG PO TABS: 300.0000 mg | ORAL_TABLET | Freq: Every day | ORAL | 0 refills | Status: DC

## 2020-05-12 MED ORDER — BUPROPION HCL ER (XL) 300 MG PO TB24: 300.0000 mg | ORAL_TABLET | Freq: Every day | ORAL | 0 refills | Status: DC

## 2020-05-12 NOTE — Progress Notes (Signed)
Virtual Visit via Video Note  I connected with Sierra Schneider on 05/12/20 at  2:20 PM EDT by a video enabled telemedicine application and verified that I am speaking with the correct person using two identifiers.  Location: Patient: In car Provider: Office   I discussed the limitations of evaluation and management by telemedicine and the availability of in person appointments. The patient expressed understanding and agreed to proceed.  History of Present Illness: Patient is evaluated by video session.  She was last seen in June 2021.  She admitted poorly compliant with medication however things are doing better now.  She was taking on and off medication until she completely ran out from the medication last week.  She reported that she need the medication because it was helping.  She tried BuSpar for 1 week but did not see any improvement and decided to stop.  She is pleased and relaxed since younger brother who had relapsed into heroin and is now getting treatment.  She reported her family situation is much better.  Recently she has seen her endocrinologist and pleased that her hemoglobin A1c dropped to 6.9.  Her job is very busy but working well.  She had a good support from her fianc.  She is compliant with Lamictal, Requip and Wellbutrin until she ran out.  She denies any crying spells or any feeling of hopelessness or worthlessness.  We have recommended therapy appointment in the last session but now she does not feel that she needed.  She feels her restless leg is better when she takes the Requip.  Her appetite is okay.  She admitted in the past 6 months few pounds weight gain but is still try to keep herself active.  She has no rash, itching, tremors or shakes.  Her attention and concentration is better since family situation is not as bad.  She denies any suicidal thoughts.   Past Psychiatric History:Reviewed. H/Odepressionafterher daughter born. TookProzac, hydroxyzine, increased  Wellbutrin, Effexor, Zoloft and Cymbalta.No H/Osuicidal attempt or inpatient, mania and psychosis.   Recent Results (from the past 2160 hour(s))  POCT Glucose (CBG)     Status: Abnormal   Collection Time: 05/10/20  3:10 PM  Result Value Ref Range   POC Glucose 103 (A) 70 - 99 mg/dl  POCT HgB L8G     Status: Abnormal   Collection Time: 05/10/20  3:12 PM  Result Value Ref Range   Hemoglobin A1C 6.9 (A) 4.0 - 5.6 %   HbA1c POC (<> result, manual entry)     HbA1c, POC (prediabetic range)     HbA1c, POC (controlled diabetic range)       Psychiatric Specialty Exam: Physical Exam  Review of Systems  Weight 154 lb (69.9 kg).There is no height or weight on file to calculate BMI.  General Appearance: Well Groomed  Eye Contact:  Good  Speech:  Clear and Coherent  Volume:  Normal  Mood:  Anxious  Affect:  Appropriate  Thought Process:  Goal Directed  Orientation:  Full (Time, Place, and Person)  Thought Content:  WDL  Suicidal Thoughts:  No  Homicidal Thoughts:  No  Memory:  Immediate;   Good Recent;   Good Remote;   Good  Judgement:  Fair  Insight:  Shallow  Psychomotor Activity:  NA  Concentration:  Concentration: Good and Attention Span: Good  Recall:  Good  Fund of Knowledge:  Good  Language:  Good  Akathisia:  No  Handed:  Right  AIMS (if indicated):  Assets:  Communication Skills Desire for Improvement Housing Resilience Social Support Talents/Skills Transportation  ADL's:  Intact  Cognition:  WNL  Sleep:   ok      Assessment and Plan: Major depressive disorder, recurrent.  Restless leg syndrome.  Anxiety.  Discuss poor compliance with medication and follow-up.  Patient agree and promised that she will keep the appointment and medication since she noticed it is working and helping if she takes.  Continue Wellbutrin XL 300 mg daily, Requip 1 mg at bedtime and Lamictal 150 mg twice a day.  Discontinue BuSpar and trazodone as patient is no longer taking.   Discussed medication side effects and benefits.  I also reviewed blood work results.  Encouraged healthy lifestyle and exercise.  Follow-up in 3 months.  Patient is not interested in therapy at this time.  Follow Up Instructions:    I discussed the assessment and treatment plan with the patient. The patient was provided an opportunity to ask questions and all were answered. The patient agreed with the plan and demonstrated an understanding of the instructions.   The patient was advised to call back or seek an in-person evaluation if the symptoms worsen or if the condition fails to improve as anticipated.  I provided 25 minutes of non-face-to-face time during this encounter.   Cleotis Nipper, MD

## 2020-05-25 DIAGNOSIS — E1065 Type 1 diabetes mellitus with hyperglycemia: Secondary | ICD-10-CM | POA: Diagnosis not present

## 2020-05-27 DIAGNOSIS — E1065 Type 1 diabetes mellitus with hyperglycemia: Secondary | ICD-10-CM | POA: Diagnosis not present

## 2020-06-21 ENCOUNTER — Encounter: Payer: Self-pay | Admitting: *Deleted

## 2020-06-21 ENCOUNTER — Other Ambulatory Visit: Payer: Self-pay

## 2020-06-21 ENCOUNTER — Ambulatory Visit
Admission: EM | Admit: 2020-06-21 | Discharge: 2020-06-21 | Disposition: A | Discharge status: Home / Self Care | Payer: Medicaid Other | Attending: Emergency Medicine | Admitting: Emergency Medicine

## 2020-06-21 DIAGNOSIS — J029 Acute pharyngitis, unspecified: Secondary | ICD-10-CM | POA: Diagnosis not present

## 2020-06-21 LAB — POCT RAPID STREP A (OFFICE): Rapid Strep A Screen: NEGATIVE

## 2020-06-21 MED ORDER — FLUTICASONE PROPIONATE 50 MCG/ACT NA SUSP: 1.0000 | Freq: Every day | NASAL | 0 refills | Status: AC

## 2020-06-21 MED ORDER — CETIRIZINE HCL 10 MG PO CAPS: 10.0000 mg | ORAL_CAPSULE | Freq: Every day | ORAL | 0 refills | Status: DC

## 2020-06-21 MED ORDER — IBUPROFEN 800 MG PO TABS: 800.0000 mg | ORAL_TABLET | Freq: Three times a day (TID) | ORAL | 0 refills | Status: DC

## 2020-06-21 NOTE — Discharge Instructions (Addendum)
Continue daily Claritin or consider switching to daily cetirizine/Zyrtec Flonase nasal spray 1 to 2 spray in each nostril daily Ibuprofen and Tylenol for throat pain Rest and fluids Follow-up if not improving or worsening

## 2020-06-21 NOTE — ED Provider Notes (Signed)
[facility]    CSN: [id number] Arrival date & time: 06/21/20  1015      History   Chief Complaint Chief Complaint  Patient presents with  . Sore Throat    HPI Sierra Schneider is a 35 y.o. female DM type I presenting today for evaluation of sore throat.  Reports over the past 3 days has had congestion sinus pressure drainage and cough with associated fatigue, the symptoms have improved but continues to have a lingering sore throat.  Using Claritin without relief.  Denies fevers.  Denies close sick contacts.  HPI  Past Medical History:  Diagnosis Date  . Depression   . Diabetes mellitus 08/2009   late onset type 1    Patient Active Problem List   Diagnosis Date Noted  . Acute bilateral low back pain without sciatica 04/20/2019  . Restless leg syndrome 07/31/2018  . Acute non-recurrent maxillary sinusitis 02/14/2018  . Annual physical exam 02/06/2018  . Abdominal spasms 02/06/2018  . Vaccine for diphtheria-tetanus-pertussis, combined 02/06/2018  . IUD (intrauterine device) in place 04/10/2016  . Insomnia 03/20/2016  . Idiopathic edema 05/10/2015  . Hidradenitis axillaris 03/16/2014  . Generalized anxiety disorder 06/29/2013  . Pure hypercholesterolemia 01/03/2013  . Cough 06/13/2011  . Depression 12/01/2010  . Uncontrolled type 1 diabetes mellitus (HCC) 09/14/2009    Past Surgical History:  Procedure Laterality Date  . CESAREAN SECTION    . ESOPHAGOGASTRODUODENOSCOPY Left 10/14/2012   Procedure: ESOPHAGOGASTRODUODENOSCOPY (EGD);  Surgeon: Vertell Novak., MD;  Location: [facility] ENDOSCOPY;  Service: Endoscopy;  Laterality: Left;  . WISDOM TOOTH EXTRACTION      OB History    Gravida  2   Para      Term      Preterm      AB  0   Living  2     SAB      IAB      Ectopic  0   Multiple      Live Births               Home Medications    Prior to Admission medications   Medication Sig Start Date End Date Taking? Authorizing  Provider  buPROPion (WELLBUTRIN XL) 300 MG 24 hr tablet Take 1 tablet (300 mg total) by mouth daily. 05/12/20  Yes Arfeen, Phillips Grout, MD  Cetirizine HCl 10 MG CAPS Take 1 capsule (10 mg total) by mouth daily for 10 days. 06/21/20 07/01/20 Yes Daniqua Campoy C, PA-C  fluticasone (FLONASE) 50 MCG/ACT nasal spray Place 1-2 sprays into both nostrils daily. 06/21/20  Yes Earlene Bjelland C, PA-C  ibuprofen (ADVIL) 800 MG tablet Take 1 tablet (800 mg total) by mouth 3 (three) times daily. 06/21/20  Yes Analyce Tavares C, PA-C  insulin lispro (HUMALOG) 100 UNIT/ML injection USE MAX 80 UNITS DAILY VIA INSULIN PUMP. 05/06/20  Yes Reather Littler, MD  lamoTRIgine (LAMICTAL) 150 MG tablet Take 2 tablets (300 mg total) by mouth daily. 05/12/20  Yes Arfeen, Phillips Grout, MD  rosuvastatin (CRESTOR) 20 MG tablet TAKE 1 TABLET BY MOUTH DAILY. MAKE APPOINTMENT FOR FOLLOW-UP 03/20/20  Yes Reather Littler, MD  acetaminophen (TYLENOL) 500 MG tablet Take 2 tablets (1,000 mg total) by mouth every 8 (eight) hours as needed. 11/03/19   Darr, Gerilyn Pilgrim, PA-C  busPIRone (BUSPAR) 5 MG tablet Take 1 tablet (5 mg total) by mouth 2 (two) times daily. Patient not taking: No sig reported 08/20/19   Arfeen, Phillips Grout, MD  rOPINIRole (  REQUIP) 1 MG tablet Take 1 tablet (1 mg total) by mouth at bedtime. 05/12/20   Arfeen, Phillips Grout, MD  traZODone (DESYREL) 50 MG tablet Take 1 tablet (50 mg total) by mouth at bedtime as needed for sleep. Patient not taking: No sig reported 05/21/19   Arfeen, Phillips Grout, MD    Family History Family History  Problem Relation Age of Onset  . Depression Mother   . Hypertension Father   . Hyperlipidemia Father   . Kidney disease Paternal Grandmother   . Diabetes Maternal Grandfather   . ADD / ADHD Brother   . Depression Brother   . Drug abuse Brother   . Depression Maternal Aunt   . Depression Paternal Aunt   . Psychosis Cousin   . Heart disease Neg Hx     Social History Social History   Tobacco Use  . Smoking status: Former Smoker     Packs/day: 0.00    Quit date: 01/26/2014    Years since quitting: 6.4  . Smokeless tobacco: Never Used  Vaping Use  . Vaping Use: Never used  Substance Use Topics  . Alcohol use: Yes    Comment: 1 drink every 3 months  . Drug use: No     Allergies   Patient has no known allergies.   Review of Systems Review of Systems  Constitutional: Negative for activity change, appetite change, chills, fatigue and fever.  HENT: Positive for sore throat. Negative for congestion, ear pain, rhinorrhea, sinus pressure and trouble swallowing.   Eyes: Negative for discharge and redness.  Respiratory: Negative for cough, chest tightness and shortness of breath.   Cardiovascular: Negative for chest pain.  Gastrointestinal: Negative for abdominal pain, diarrhea, nausea and vomiting.  Musculoskeletal: Negative for myalgias.  Skin: Negative for rash.  Neurological: Negative for dizziness, light-headedness and headaches.     Physical Exam Triage Vital Signs ED Triage Vitals  Enc Vitals Group     BP 06/21/20 1030 137/85     Pulse Rate 06/21/20 1030 76     Resp 06/21/20 1030 18     Temp 06/21/20 1030 98.5 F (36.9 C)     Temp Source 06/21/20 1030 Oral     SpO2 06/21/20 1030 96 %     Weight --      Height --      Head Circumference --      Peak Flow --      Pain Score 06/21/20 1031 7     Pain Loc --      Pain Edu? --      Excl. in GC? --    No data found.  Updated Vital Signs BP 137/85 (BP Location: Left Arm)   Pulse 76   Temp 98.5 F (36.9 C) (Oral)   Resp 18   SpO2 96%   Visual Acuity Right Eye Distance:   Left Eye Distance:   Bilateral Distance:    Right Eye Near:   Left Eye Near:    Bilateral Near:     Physical Exam Vitals and nursing note reviewed.  Constitutional:      Appearance: She is well-developed.     Comments: No acute distress  HENT:     Head: Normocephalic and atraumatic.     Ears:     Comments: Bilateral ears without tenderness to palpation of  external auricle, tragus and mastoid, EAC's without erythema or swelling, TM's with good bony landmarks and cone of light. Non erythematous. Clear effusion present on right  Nose: Nose normal.     Mouth/Throat:     Comments: Oral mucosa pink and moist, no tonsillar enlargement or exudate. Posterior pharynx patent and nonerythematous, no uvula deviation or swelling. Normal phonation. Eyes:     Conjunctiva/sclera: Conjunctivae normal.  Cardiovascular:     Rate and Rhythm: Normal rate.  Pulmonary:     Effort: Pulmonary effort is normal. No respiratory distress.     Comments: Breathing comfortably at rest, CTABL, no wheezing, rales or other adventitious sounds auscultated Abdominal:     General: There is no distension.  Musculoskeletal:        General: Normal range of motion.     Cervical back: Neck supple.  Skin:    General: Skin is warm and dry.  Neurological:     Mental Status: She is alert and oriented to person, place, and time.      UC Treatments / Results  Labs (all labs ordered are listed, but only abnormal results are displayed) Labs Reviewed  CULTURE, GROUP A STREP [facility])  POCT RAPID STREP A (OFFICE)    EKG   Radiology No results found.  Procedures Procedures (including critical care time)  Medications Ordered in UC Medications - No data to display  Initial Impression / Assessment and Plan / UC Course  I have reviewed the triage vital signs and the nursing notes.  Pertinent labs & imaging results that were available during my care of the patient were reviewed by me and considered in my medical decision making (see chart for details).     Strep test negative, suspect likely viral etiology versus postnasal drainage and recommending symptomatic and supportive care, discussed continuing antihistamine, may switch to Zyrtec as alternative, add in Flonase for eustachian tube dysfunction, Tylenol and ibuprofen.  Continue to monitor over the next 3 to 4 days.  Did  advise patient if she is not seeing any improvement on Saturday she may call the urgent care and I will send in a prescription of Augmentin x1 week for her.  Discussed strict return precautions. Patient verbalized understanding and is agreeable with plan.  Final Clinical Impressions(s) / UC Diagnoses   Final diagnoses:  Sore throat     Discharge Instructions     Continue daily Claritin or consider switching to daily cetirizine/Zyrtec Flonase nasal spray 1 to 2 spray in each nostril daily Ibuprofen and Tylenol for throat pain Rest and fluids Follow-up if not improving or worsening    ED Prescriptions    Medication Sig Dispense Auth. Provider   fluticasone (FLONASE) 50 MCG/ACT nasal spray Place 1-2 sprays into both nostrils daily. 16 g Setareh Rom C, PA-C   Cetirizine HCl 10 MG CAPS Take 1 capsule (10 mg total) by mouth daily for 10 days. 10 capsule Karmina Zufall C, PA-C   ibuprofen (ADVIL) 800 MG tablet Take 1 tablet (800 mg total) by mouth 3 (three) times daily. 21 tablet Amyjo Mizrachi, [place] C, PA-C     PDMP not reviewed this encounter.   Lew Dawes, [place] 06/21/20 1115

## 2020-06-21 NOTE — ED Triage Notes (Signed)
Patient presents with c/o sore throat, faytigue and generalized weakness x 3 days.  Reports sinus and allergy issues, taking claritin without relief.    Denies cough or fever

## 2020-06-23 LAB — CULTURE, GROUP A STREP (THRC)

## 2020-06-24 ENCOUNTER — Telehealth: Payer: Self-pay | Admitting: Endocrinology

## 2020-06-24 NOTE — Telephone Encounter (Signed)
Please type a generic letter stating she has diabetes on needs to take her insulin pump supplies and glucose testing supplies with her on the plane

## 2020-06-24 NOTE — Telephone Encounter (Signed)
Patient called stating she is going to travel and she asked if she would be able to have a copy of her prescriptions in case they ask to see those when she gets on the air plane? She asked if we were allowed to fax it to her if so. Please advise.

## 2020-06-24 NOTE — Telephone Encounter (Signed)
Pt stated does not need any refill but need documentation or letter. Please advise

## 2020-06-24 NOTE — Telephone Encounter (Signed)
Notified pt--Letter been sent to [facility].

## 2020-06-28 DIAGNOSIS — E1065 Type 1 diabetes mellitus with hyperglycemia: Secondary | ICD-10-CM | POA: Diagnosis not present

## 2020-07-13 DIAGNOSIS — M545 Low back pain, unspecified: Secondary | ICD-10-CM | POA: Diagnosis not present

## 2020-07-13 DIAGNOSIS — G8929 Other chronic pain: Secondary | ICD-10-CM | POA: Diagnosis not present

## 2020-07-13 DIAGNOSIS — M25511 Pain in right shoulder: Secondary | ICD-10-CM | POA: Diagnosis not present

## 2020-07-13 DIAGNOSIS — M25512 Pain in left shoulder: Secondary | ICD-10-CM | POA: Diagnosis not present

## 2020-07-28 ENCOUNTER — Other Ambulatory Visit (HOSPITAL_COMMUNITY): Payer: Self-pay | Admitting: Psychiatry

## 2020-07-28 DIAGNOSIS — G2581 Restless legs syndrome: Secondary | ICD-10-CM

## 2020-07-28 DIAGNOSIS — F331 Major depressive disorder, recurrent, moderate: Secondary | ICD-10-CM

## 2020-07-28 DIAGNOSIS — F419 Anxiety disorder, unspecified: Secondary | ICD-10-CM

## 2020-07-29 DIAGNOSIS — E1065 Type 1 diabetes mellitus with hyperglycemia: Secondary | ICD-10-CM | POA: Diagnosis not present

## 2020-08-10 ENCOUNTER — Telehealth: Payer: Self-pay | Admitting: Family Medicine

## 2020-08-10 NOTE — Telephone Encounter (Signed)
..   Medicaid Managed Care   Unsuccessful Outreach Note  08/10/2020 Name: Sierra Schneider MRN: [id number] DOB: [birth year]  Referred by: Fayette Pho, MD Reason for referral : High Risk Managed Medicaid (Attempted to reach [name] today to get her scheduled for a phone visit with the [facility] team. She did not answer and her VM was full.)   An unsuccessful telephone outreach was attempted today. The patient was referred to the case management team for assistance with care management and care coordination.   Follow Up Plan: The care management team will reach out to the patient again over the next 7-14 days.   Weston Settle Care Guide, High Risk Medicaid Managed Care Embedded Care Coordination [facility]  [facility]

## 2020-08-12 ENCOUNTER — Other Ambulatory Visit: Payer: Self-pay

## 2020-08-12 ENCOUNTER — Telehealth (INDEPENDENT_AMBULATORY_CARE_PROVIDER_SITE_OTHER): Payer: Medicaid Other | Admitting: Psychiatry

## 2020-08-12 ENCOUNTER — Encounter (HOSPITAL_COMMUNITY): Payer: Self-pay | Admitting: Psychiatry

## 2020-08-12 VITALS — Wt 148.0 lb

## 2020-08-12 DIAGNOSIS — F331 Major depressive disorder, recurrent, moderate: Secondary | ICD-10-CM | POA: Diagnosis not present

## 2020-08-12 DIAGNOSIS — F419 Anxiety disorder, unspecified: Secondary | ICD-10-CM

## 2020-08-12 DIAGNOSIS — G2581 Restless legs syndrome: Secondary | ICD-10-CM | POA: Diagnosis not present

## 2020-08-12 MED ORDER — LAMOTRIGINE 150 MG PO TABS: 300.0000 mg | ORAL_TABLET | Freq: Every day | ORAL | 0 refills | Status: DC

## 2020-08-12 MED ORDER — LORAZEPAM 0.5 MG PO TABS: 0.5000 mg | ORAL_TABLET | Freq: Every day | ORAL | 0 refills | Status: DC | PRN

## 2020-08-12 MED ORDER — BUPROPION HCL ER (XL) 300 MG PO TB24: 300.0000 mg | ORAL_TABLET | Freq: Every day | ORAL | 0 refills | Status: DC

## 2020-08-12 NOTE — Progress Notes (Signed)
Virtual Visit via Telephone Note  I connected with Sierra Schneider on 08/12/20 at 10:00 AM EDT by telephone and verified that I am speaking with the correct person using two identifiers.  Location: Patient: Work Provider: Economist   I discussed the limitations, risks, security and privacy concerns of performing an evaluation and management service by telephone and the availability of in person appointments. I also discussed with the patient that there may be a patient responsible charge related to this service. The patient expressed understanding and agreed to proceed.   History of Present Illness: Patient is evaluated by phone session.  She is doing much better since taking the Wellbutrin and Lamictal.  Her son graduated and now he is going to start [employer] and like to study Manufacturing engineer.  Patient told his brother is still in a methadone clinic but someone she is concerned about him as he wanted to stop the program and the procedure.  Patient told the job is going well.  Sometimes she works from home.  She admitted lately some anxiety because father is not doing well and he is in the process of more testing and doctors are concerned about cancer.  She is taking her father to the appointment and she feels sometimes very nervous and anxious.  She admitted sometimes feeling tired and exhausted but reported the lamotrigine and Wellbutrin working very well.  She is sleeping good and she is taking sleep medicine but do not know the name of the sleep medication.  She is not sure if she is taking Requip or trazodone.  Her restless leg is better.  She had stopped the BuSpar while ago.  She denies any hallucination, paranoia, crying spells or any feeling of hopelessness was suicidal thoughts.  She is trying to lose weight and she had lost few pounds since the last visit.  She has no tremors, shakes or any EPS.    Past Psychiatric History: Reviewed. H/O depression after her daughter born.  Took  Prozac, hydroxyzine, increased Wellbutrin, Effexor, Zoloft and Cymbalta. No H/O suicidal attempt or inpatient, mania and psychosis.   Psychiatric Specialty Exam: Physical Exam  Review of Systems  Weight 148 lb (67.1 kg).There is no height or weight on file to calculate BMI.  General Appearance: NA  Eye Contact:  NA  Speech:  Normal Rate  Volume:  Normal  Mood:  Anxious  Affect:  NA  Thought Process:  Goal Directed  Orientation:  Full (Time, Place, and Person)  Thought Content:  WDL  Suicidal Thoughts:  No  Homicidal Thoughts:  No  Memory:  Immediate;   Good Recent;   Good Remote;   Good  Judgement:  Good  Insight:  Good  Psychomotor Activity:  NA  Concentration:  Concentration: Good and Attention Span: Good  Recall:  Good  Fund of Knowledge:  Good  Language:  Good  Akathisia:  No  Handed:  Right  AIMS (if indicated):     Assets:  Communication Skills Desire for Improvement Housing Resilience Social Support Talents/Skills Transportation  ADL's:  Intact  Cognition:  WNL  Sleep:   ok      Assessment and Plan: Major depressive disorder, recurrent.  Restless leg syndrome.  Anxiety.  Patient overall doing better but having some time anxiety about her father's health as recently doing a lot of testing.  Patient told daughter are not sure if he has cancer.  She like to try something to help her anxiety when she has to take  a doctor's appointment.  She denies any panic attack.  I discussed either she can take the higher dose of BuSpar to help with anxiety.  In the past she has taken BuSpar 5 mg twice a day but it is a low-dose.  Or she can try taking lorazepam 0.5 mg as needed when she has to take her father to the doctor's appointment.  Patient agreed to give her Ativan 0.5 mg to take as needed however if she noticed her anxiety is every day and multiple times then she also open to try higher dose of BuSpar.  We will provide Ativan 0.5 mg #20 to take as needed for severe  anxiety.  Discussed benzodiazepine dependence, tolerance and withdrawal.  She is not sure if she is taking Requip or trazodone but she is sleeping better.  She will call us back to get the refill of the medicine that is helping her sleep.  We will continue Lamictal 150 mg twice a day and Wellbutrin XL 300 mg daily.  Recommended to call us back if she has any question or any concern.  Follow-up in 3 months.  Follow Up Instructions:    I discussed the assessment and treatment plan with the patient. The patient was provided an opportunity to ask questions and all were answered. The patient agreed with the plan and demonstrated an understanding of the instructions.   The patient was advised to call back or seek an in-person evaluation if the symptoms worsen or if the condition fails to improve as anticipated.  I provided 24 minutes of non-face-to-face time during this encounter.   Cleotis Nipper, MD

## 2020-08-15 ENCOUNTER — Other Ambulatory Visit: Payer: Self-pay | Admitting: Endocrinology

## 2020-08-23 ENCOUNTER — Other Ambulatory Visit: Payer: Self-pay | Admitting: Endocrinology

## 2020-08-23 DIAGNOSIS — E1065 Type 1 diabetes mellitus with hyperglycemia: Secondary | ICD-10-CM

## 2020-08-30 DIAGNOSIS — E1065 Type 1 diabetes mellitus with hyperglycemia: Secondary | ICD-10-CM | POA: Diagnosis not present

## 2020-09-01 ENCOUNTER — Telehealth: Payer: Self-pay | Admitting: Family Medicine

## 2020-09-01 NOTE — Telephone Encounter (Signed)
..  Patient declines further follow up and engagement by the Managed Medicaid Team. Appropriate care team members and provider have been notified via electronic communication. The Managed Medicaid Team is available to follow up with the patient after provider conversation with the patient regarding recommendation for engagement and subsequent re-referral to the Managed Medicaid Team.    Jennifer Alley Care Guide, High Risk Medicaid Managed Care Embedded Care Coordination [place]  [facility]   

## 2020-09-02 ENCOUNTER — Encounter: Payer: Self-pay | Admitting: Endocrinology

## 2020-09-04 ENCOUNTER — Other Ambulatory Visit (HOSPITAL_COMMUNITY): Payer: Self-pay | Admitting: Psychiatry

## 2020-09-04 DIAGNOSIS — G2581 Restless legs syndrome: Secondary | ICD-10-CM

## 2020-09-07 ENCOUNTER — Other Ambulatory Visit: Payer: Self-pay

## 2020-09-07 ENCOUNTER — Other Ambulatory Visit (INDEPENDENT_AMBULATORY_CARE_PROVIDER_SITE_OTHER): Payer: Medicaid Other

## 2020-09-07 DIAGNOSIS — E1065 Type 1 diabetes mellitus with hyperglycemia: Secondary | ICD-10-CM | POA: Diagnosis not present

## 2020-09-07 DIAGNOSIS — E7800 Pure hypercholesterolemia, unspecified: Secondary | ICD-10-CM | POA: Diagnosis not present

## 2020-09-07 LAB — COMPREHENSIVE METABOLIC PANEL
ALT: 30 U/L (ref 0–35)
AST: 26 U/L (ref 0–37)
Albumin: 4.4 g/dL (ref 3.5–5.2)
Alkaline Phosphatase: 104 U/L (ref 39–117)
BUN: 8 mg/dL (ref 6–23)
CO2: 29 mEq/L (ref 19–32)
Calcium: 9.3 mg/dL (ref 8.4–10.5)
Chloride: 104 mEq/L (ref 96–112)
Creatinine, Ser: 0.73 mg/dL (ref 0.40–1.20)
GFR: 105.76 mL/min (ref >60.00)
Glucose, Bld: 135 mg/dL — ABNORMAL HIGH (ref 70–99)
Potassium: 4.4 mEq/L (ref 3.5–5.1)
Sodium: 138 mEq/L (ref 135–145)
Total Bilirubin: 0.6 mg/dL (ref 0.2–1.2)
Total Protein: 6.8 g/dL (ref 6.0–8.3)

## 2020-09-07 LAB — LIPID PANEL
Cholesterol: 185 mg/dL (ref 0–200)
HDL: 62.7 mg/dL (ref >39.00)
LDL Cholesterol: 108 mg/dL — ABNORMAL HIGH (ref 0–99)
NonHDL: 122.05
Total CHOL/HDL Ratio: 3
Triglycerides: 72 mg/dL (ref 0.0–149.0)
VLDL: 14.4 mg/dL (ref 0.0–40.0)

## 2020-09-07 LAB — GLUCOSE, RANDOM: Glucose, Bld: 135 mg/dL — ABNORMAL HIGH (ref 70–99)

## 2020-09-07 LAB — HEMOGLOBIN A1C: Hgb A1c MFr Bld: 7.5 % — ABNORMAL HIGH (ref 4.6–6.5)

## 2020-09-08 ENCOUNTER — Other Ambulatory Visit: Payer: Medicaid Other

## 2020-09-12 ENCOUNTER — Other Ambulatory Visit: Payer: Medicaid Other

## 2020-09-15 ENCOUNTER — Other Ambulatory Visit: Payer: Self-pay

## 2020-09-15 ENCOUNTER — Ambulatory Visit (INDEPENDENT_AMBULATORY_CARE_PROVIDER_SITE_OTHER): Payer: Medicaid Other | Admitting: Endocrinology

## 2020-09-15 ENCOUNTER — Encounter: Payer: Self-pay | Admitting: Endocrinology

## 2020-09-15 VITALS — BP 118/70 | HR 80 | Ht 64.0 in | Wt 151.6 lb

## 2020-09-15 DIAGNOSIS — E7800 Pure hypercholesterolemia, unspecified: Secondary | ICD-10-CM | POA: Diagnosis not present

## 2020-09-15 DIAGNOSIS — E1065 Type 1 diabetes mellitus with hyperglycemia: Secondary | ICD-10-CM

## 2020-09-15 NOTE — Progress Notes (Signed)
Patient ID: Sierra Schneider, female   DOB: 17-Aug-1985, 35 y.o.   MRN: [id number]   Reason for Appointment: Diabetes followup:   History of Present Illness   Diagnosis: Type 1 DIABETES MELITUS     DIABETES history: She has had diabetes since about 2011 and has been on insulin pump for several years She had fairly good control in the first 2 years of her disease but blood sugar control had been more difficult subsequently  CURRENT insulin pump:  Medtronic 770 G  CURRENT insulin: HUMALOG  SETTINGS are:    Basal rate on current download = Midnight = 1.3, 7 AM = 1.4, 10 AM = 0.6, 12 noon = 1.3, 6 PM = 1.05, 7 PM = 0.9, 10 PM = 1.0  Total basal insulin = about 24 units Carb Ratio: 1: 7 until 12 noon and then 1:9.  1: 14 at 5 PM Correction factor 1: 35, target 120 Active insulin time 3 hours    Current management, blood sugar patterns and problems identified:  Her A1c has gone up to 7.5  Pump data:  She has been in the AUTO mode 87 % of the time Reasons for exiting auto mode are high glucose  max delivery and twice disabled by user Recent daily insulin dose 58 units with 46 % in basal   BLOOD SUGAR PATTERNS, management and problems:  She has about the same time in target but A1c is higher She thinks that she has not been consistent with her diet She is tending to have more sweets and snacks with carbohydrate including at breakfast  However has lost 3 pounds in weight She does have significant variability in blood sugars with standard deviation 52 especially after meals in the afternoon and evening  Appears to be getting tendency to low normal readings after her lunch bolus more often She is treating low blood sugars with candy such as [employer] peanut butter cups from the vending machine  Also may be sometimes higher in the morning from snacks rather than a breakfast meal  Often is trying to take her lunch from home and avoiding fast food  Also may be getting higher  readings later in the evening for snacks without coverage She may not be consistently getting control of early postprandial hyperglycemia from the timing of her bolus which may be late Not doing much exercise lately  Blood sugar patterns from CGM analysis show the following:  Her blood sugars are showing significant variability after lunch and dinner and on the highest level are about 180 average around 9-10 PM  OVERNIGHT blood sugars are relatively well controlled at least after 2 AM  Although she is having only a few breakfast boluses her average blood sugar at breakfast bolus is 175  Other Premeal blood sugars are 145 and 168  POSTPRANDIAL readings are highly variable after lunch including some hypoglycemia  Blood sugar may be trending higher 2 to 3 hours after dinner  Hypoglycemia has been around noontime or 5 PM and usually transient  CGM use % of time 84  2-week average/GV 155/52  Time in range    69    % was 68   % Time Above 180 24+5  % Time above 250   % Time Below 70 2     PRE-MEAL Fasting Lunch Dinner Bedtime Overall  Glucose range:       Averages: 175 145      2-hour POST-MEAL PC Breakfast PC Lunch PC Dinner  Glucose range:     Averages: 143 146 173   PREVIOUSLY:   CGM use % of time  81  2-week average/GV  158/55  Time in range      68% versus 67  % Time Above 180  23  % Time above 250 7  % Time Below 70 2     PRE-MEAL Fasting Lunch Dinner Bedtime Overall  Glucose range:       Averages:  155  176  193     POST-MEAL PC Breakfast PC Lunch PC Dinner  Glucose range:     Averages:  178  178  182       Wt Readings from Last 3 Encounters:  09/15/20 151 lb 9.6 oz (68.8 kg)  05/10/20 154 lb 12.8 oz (70.2 kg)  01/11/20 152 lb 12.8 oz (69.3 kg)     LABS:  Lab Results  Component Value Date   HGBA1C 7.5 (H) 09/07/2020   HGBA1C 6.9 (A) 05/10/2020   HGBA1C  7.6 (H) 01/04/2020   Lab Results  Component Value Date   MICROALBUR 1.1 01/04/2020   LDLCALC 108 (H) 09/07/2020   CREATININE 0.73 09/07/2020    OTHER active problems discussed today: See review of systems    Allergies as of 09/15/2020   No Known Allergies      Medication List        Accurate as of September 15, 2020 11:59 PM. If you have any questions, ask your nurse or doctor.          acetaminophen 500 MG tablet Commonly known as: TYLENOL Take 2 tablets (1,000 mg total) by mouth every 8 (eight) hours as needed.   buPROPion 300 MG 24 hr tablet Commonly known as: WELLBUTRIN XL Take 1 tablet (300 mg total) by mouth daily.   busPIRone 5 MG tablet Commonly known as: BUSPAR Take 1 tablet (5 mg total) by mouth 2 (two) times daily.   Cetirizine HCl 10 MG Caps Take 1 capsule (10 mg total) by mouth daily for 10 days.   fluticasone 50 MCG/ACT nasal spray Commonly known as: FLONASE Place 1-2 sprays into both nostrils daily.   ibuprofen 800 MG tablet Commonly known as: ADVIL Take 1 tablet (800 mg total) by mouth 3 (three) times daily.   insulin lispro 100 UNIT/ML injection Commonly known as: HumaLOG USE MAX 80 UNITS DAILY VIA INSULIN PUMP.   lamoTRIgine 150 MG tablet Commonly known as: LAMICTAL Take 2 tablets (300 mg total) by mouth daily.   LORazepam 0.5 MG tablet Commonly known as: Ativan Take 1 tablet (0.5 mg total) by mouth daily as needed for anxiety.   rOPINIRole 1 MG tablet Commonly known as: REQUIP Take 1 tablet (1 mg total) by mouth at bedtime.   rosuvastatin 20 MG tablet Commonly known as: CRESTOR TAKE 1 TABLET BY MOUTH DAILY. MAKE APPOINTMENT FOR FOLLOW-UP   traZODone 50 MG tablet Commonly known as: DESYREL Take 1 tablet (50 mg total) by mouth at bedtime as needed for sleep.        Allergies: No Known Allergies  Past Medical History:  Diagnosis  Date   Depression    Diabetes mellitus 08/2009   late onset type 1    Past Surgical History:   Procedure Laterality Date   CESAREAN SECTION     ESOPHAGOGASTRODUODENOSCOPY Left 10/14/2012   Procedure: ESOPHAGOGASTRODUODENOSCOPY (EGD);  Surgeon: Vertell Novak., MD;  Location[facility] END[facility]COPY;  Service: Endoscopy;  Laterality: Left;   WISDOM TOOTH EXTRACTION      Family History  Problem Relation Age of Onset   Depression Mother    Hypertension Father    Hyperlipidemia Father    Kidney disease Paternal Grandmother    Diabetes Maternal Grandfather    ADD / ADHD Brother    Depression Brother    Drug abuse Brother    Depression Maternal Aunt    Depression Paternal Aunt    Psychosis Cousin    Heart disease Neg Hx     Social History:  reports that she quit smoking about 6 years ago. Her smoking use included cigarettes. She has never used smokeless tobacco. She reports current alcohol use. She reports that she does not use drugs.  REVIEW of systems:   HYPERCHOLESTEROLEMIA,  Because of her family history of hypercholesterolemia and her diabetes she was previously on pravastatin, on Crestor 20 mg with baseline of LDL of 146  LDL well controlled with recently but now significantly higher This may be likely related to eating more snacks with high saturated fat like candy  Back Lab Results  Component Value Date   CHOL 185 09/07/2020   CHOL 148 06/02/2019   CHOL 138 09/10/2018   Lab Results  Component Value Date   HDL 62.70 09/07/2020   HDL 55.60 06/02/2019   HDL 52.80 09/10/2018   Lab Results  Component Value Date   LDLCALC 108 (H) 09/07/2020   LDLCALC 77 06/02/2019   LDLCALC 69 09/10/2018    Lab Results  Component Value Date   TRIG 72.0 09/07/2020   TRIG 75.0 06/02/2019   TRIG 81.0 09/10/2018   Lab Results  Component Value Date   CHOLHDL 3 09/07/2020   CHOLHDL 3 06/02/2019   CHOLHDL 3 09/10/2018   Lab Results  Component Value Date   LDLDIRECT 129.4 12/29/2012    She had mild respiratory symptoms from Covid infection in October 2021 She has had her  second Covid shot    EXAM:  BP 118/70   Pulse 80   Ht 5\' 4"  (1.626 m)   Wt 151 lb 9.6 oz (68.8 kg)   SpO2 99%   BMI 26.02 kg/m    ASSESSMENT:    Type I diabetes on Medtronic 770 pump  See history of present illness for detailed discussion of  current management, blood sugar patterns and problems identified  Her A1c is up to 7.5 compared to 6.9   She is getting about the same time in target at 69% of her readings within target  However may be having overall higher readings postprandially in the last 3 months causing raised A1c  However she is appears to be more sensitive to her lunchtime bolus possibly from eating healthier Blood sugars at night after supper may be higher from snacks Also frequently may be having advanced snacks and more carbohydrate or sweets at different times including in the morning Not exercising lately  Recommendations:  Make sure she tries to bolus at least 10 to 15 minutes before meals Additional boluses when she is eating any higher fat meals or snacks at night  Carbohydrate ratio 1:11 at lunch instead of  1: 9 Cut back on higher saturated fat foods and snacks Regular exercise Discussed appropriate treatment of hypoglycemia and she will need to avoid candy bars, she can either use regular soft drink or juice otherwise can carry some raisins with her  LIPIDS: Not controlled because of likely getting more saturated fat in snacks, also discussed cutting back on high fat meats and cheeses   Patient Instructions  Carb ratio 1:11 at lunch  Raisins for low sugars      Reather Littler 09/18/2020, 9:14 PM

## 2020-09-15 NOTE — Patient Instructions (Addendum)
Carb ratio 1:11 at lunch  Raisins for low sugars

## 2020-09-27 ENCOUNTER — Other Ambulatory Visit (HOSPITAL_COMMUNITY): Payer: Self-pay | Admitting: Psychiatry

## 2020-09-27 DIAGNOSIS — F419 Anxiety disorder, unspecified: Secondary | ICD-10-CM

## 2020-09-29 DIAGNOSIS — E1065 Type 1 diabetes mellitus with hyperglycemia: Secondary | ICD-10-CM | POA: Diagnosis not present

## 2020-10-10 DIAGNOSIS — E1065 Type 1 diabetes mellitus with hyperglycemia: Secondary | ICD-10-CM

## 2020-10-11 MED ORDER — ACCU-CHEK GUIDE VI STRP
ORAL_STRIP | 2 refills | Status: DC
Start: 2020-10-11 — End: 2021-03-14

## 2020-11-01 DIAGNOSIS — E1065 Type 1 diabetes mellitus with hyperglycemia: Secondary | ICD-10-CM | POA: Diagnosis not present

## 2020-11-04 ENCOUNTER — Other Ambulatory Visit: Payer: Self-pay | Admitting: Endocrinology

## 2020-11-04 DIAGNOSIS — E1065 Type 1 diabetes mellitus with hyperglycemia: Secondary | ICD-10-CM

## 2020-11-10 ENCOUNTER — Other Ambulatory Visit (HOSPITAL_COMMUNITY): Payer: Self-pay | Admitting: Psychiatry

## 2020-11-10 DIAGNOSIS — F331 Major depressive disorder, recurrent, moderate: Secondary | ICD-10-CM

## 2020-11-10 DIAGNOSIS — F419 Anxiety disorder, unspecified: Secondary | ICD-10-CM

## 2020-11-14 ENCOUNTER — Other Ambulatory Visit: Payer: Self-pay

## 2020-11-14 ENCOUNTER — Encounter (HOSPITAL_COMMUNITY): Payer: Self-pay | Admitting: Psychiatry

## 2020-11-14 ENCOUNTER — Telehealth (INDEPENDENT_AMBULATORY_CARE_PROVIDER_SITE_OTHER): Payer: Medicaid Other | Admitting: Psychiatry

## 2020-11-14 DIAGNOSIS — F419 Anxiety disorder, unspecified: Secondary | ICD-10-CM | POA: Diagnosis not present

## 2020-11-14 DIAGNOSIS — F331 Major depressive disorder, recurrent, moderate: Secondary | ICD-10-CM

## 2020-11-14 MED ORDER — BUPROPION HCL ER (XL) 300 MG PO TB24: 300.0000 mg | ORAL_TABLET | Freq: Every day | ORAL | 0 refills | Status: DC

## 2020-11-14 MED ORDER — TRAZODONE HCL 100 MG PO TABS: 100.0000 mg | ORAL_TABLET | Freq: Every evening | ORAL | 1 refills | Status: DC | PRN

## 2020-11-14 MED ORDER — LAMOTRIGINE 150 MG PO TABS: 300.0000 mg | ORAL_TABLET | Freq: Every day | ORAL | 0 refills | Status: DC

## 2020-11-14 NOTE — Progress Notes (Signed)
Virtual Visit via Telephone Note  I connected with Sierra Schneider on 11/14/20 at  8:20 AM EDT by telephone and verified that I am speaking with the correct person using two identifiers.  Location: Patient: Home Provider: Home Office   I discussed the limitations, risks, security and privacy concerns of performing an evaluation and management service by telephone and the availability of in person appointments. I also discussed with the patient that there may be a patient responsible charge related to this service. The patient expressed understanding and agreed to proceed.   History of Present Illness: Patient is evaluated by phone session.  She admitted family stress as now her 65 year old son started drinking and one night came drunk.  She is worried about him and she like him to finish is a school but since he is working patient is worried he may stop the school.  Patient's son go to [employer] [facility] [employer] and also working physical work.  Overall patient feels that she can manage the stress some time very well but is still worried about her family members.  Patient is sleeping some nights not good.  She has taken trazodone in the past but that did not work and she is not sure maybe the dose is very small.  Her restless leg is manageable and she is not taking Requip.  Occasionally she takes the Ativan when she has to go to the doctor's appointment for her father.  Her father is doing better and her brother is also recently finished the rehab and methadone clinic.  Patient is relieved that brother is now working and had a stable job.  Patient denies any panic attack, crying spells or any feeling of hopelessness or worthlessness.  She is working and reported no concerns or issues with the medication.  Recently she had a blood work and her hemoglobin A1c is 7.5 which is higher than before.  She admitted it could be due to stress related to her 77 year old son.  She has no rash, itching tremors or shakes.  Past  Psychiatric History: Reviewed. H/O depression after her daughter born.  Took Prozac, hydroxyzine, increased Wellbutrin, Effexor, Zoloft and Cymbalta. No H/O suicidal attempt or inpatient, mania and psychosis.   Recent Results (from the past 2160 hour(s))  Glucose, random     Status: Abnormal   Collection Time: 09/07/20  8:45 AM  Result Value Ref Range   Glucose, Bld 135 (H) 70 - 99 mg/dL  Hemoglobin V2Z     Status: Abnormal   Collection Time: 09/07/20  8:45 AM  Result Value Ref Range   Hgb A1c MFr Bld 7.5 (H) 4.6 - 6.5 %    Comment: Glycemic Control Guidelines for People with Diabetes:Non Diabetic:  <6%Goal of Therapy: <7%Additional Action Suggested:  >8%   Lipid panel     Status: Abnormal   Collection Time: 09/07/20  8:45 AM  Result Value Ref Range   Cholesterol 185 0 - 200 mg/dL    Comment: ATP III Classification       Desirable:  < 200 mg/dL               Borderline High:  200 - 239 mg/dL          High:  > = 366 mg/dL   Triglycerides 44.0 0.0 - 149.0 mg/dL    Comment: Normal:  <347 mg/dLBorderline High:  150 - 199 mg/dL   HDL 42.59 >56.38 mg/dL   VLDL 75.6 0.0 - 43.3 mg/dL   LDL Cholesterol 295 (  H) 0 - 99 mg/dL   Total CHOL/HDL Ratio 3     Comment:                Men          Women1/2 Average Risk     3.4          3.3Average Risk          5.0          4.42X Average Risk          9.6          7.13X Average Risk          15.0          11.0                       NonHDL 122.05     Comment: NOTE:  Non-HDL goal should be 30 mg/dL higher than patient's LDL goal (i.e. LDL goal of < 70 mg/dL, would have non-HDL goal of < 100 mg/dL)  Comprehensive metabolic panel     Status: Abnormal   Collection Time: 09/07/20  8:45 AM  Result Value Ref Range   Sodium 138 135 - 145 mEq/L   Potassium 4.4 3.5 - 5.1 mEq/L   Chloride 104 96 - 112 mEq/L   CO2 29 19 - 32 mEq/L   Glucose, Bld 135 (H) 70 - 99 mg/dL   BUN 8 6 - 23 mg/dL   Creatinine, Ser 0.17 0.40 - 1.20 mg/dL   Total Bilirubin 0.6 0.2 - 1.2  mg/dL   Alkaline Phosphatase 104 39 - 117 U/L   AST 26 0 - 37 U/L   ALT 30 0 - 35 U/L   Total Protein 6.8 6.0 - 8.3 g/dL   Albumin 4.4 3.5 - 5.2 g/dL   GFR 510.25 >85.27 mL/min    Comment: Calculated using the CKD-EPI Creatinine Equation (2021)   Calcium 9.3 8.4 - 10.5 mg/dL      Psychiatric Specialty Exam: Physical Exam  Review of Systems  Weight 151 lb (68.5 kg).There is no height or weight on file to calculate BMI.  General Appearance: NA  Eye Contact:  NA  Speech:  Normal Rate  Volume:  Normal  Mood:  Dysphoric  Affect:  NA  Thought Process:  Goal Directed  Orientation:  Full (Time, Place, and Person)  Thought Content:  Rumination  Suicidal Thoughts:  No  Homicidal Thoughts:  No  Memory:  Immediate;   Good Recent;   Good Remote;   Good  Judgement:  Good  Insight:  Present  Psychomotor Activity:  NA  Concentration:  Concentration: Good and Attention Span: Good  Recall:  Good  Fund of Knowledge:  Good  Language:  Good  Akathisia:  No  Handed:  Right  AIMS (if indicated):     Assets:  Communication Skills Desire for Improvement Housing Resilience Transportation  ADL's:  Intact  Cognition:  WNL  Sleep:   fair      Assessment and Plan: Major depressive disorder, recurrent.  Anxiety.  Patient is not taking buspirone, Requip.  She is open to try higher dose of trazodone since 50 mg in the past did not work.  We will try 100 mg trazodone at bedtime to help sleep.  Continue Lamictal 150 mg twice a day and Wellbutrin XL 300 mg daily.  We have discussed in the past about therapy but patient has not seen any improvement in the past  for therapy.  Patient has Ativan 0.5 mg which she takes only when she need her severe anxiety or has to take them taking father to her doctor's appointment.  Discussed medication side effects and benefits.  Recommended to call us back if she has any question or any concern.  Follow-up in 3 months.  Patient will call if she needs the Ativan  refills.  Follow Up Instructions:    I discussed the assessment and treatment plan with the patient. The patient was provided an opportunity to ask questions and all were answered. The patient agreed with the plan and demonstrated an understanding of the instructions.   The patient was advised to call back or seek an in-person evaluation if the symptoms worsen or if the condition fails to improve as anticipated.  I provided 18 minutes of non-face-to-face time during this encounter.   Cleotis Nipper, MD

## 2020-11-28 ENCOUNTER — Other Ambulatory Visit (HOSPITAL_COMMUNITY): Payer: Self-pay | Admitting: Psychiatry

## 2020-11-28 DIAGNOSIS — F419 Anxiety disorder, unspecified: Secondary | ICD-10-CM

## 2020-11-29 ENCOUNTER — Other Ambulatory Visit: Payer: Self-pay | Admitting: Endocrinology

## 2020-11-29 MED ORDER — OMNIPOD 5 DEXG7G6 PODS GEN 5 MISC: 1.0000 | 3 refills | Status: DC

## 2020-11-29 MED ORDER — DEXCOM G6 SENSOR MISC: 3 refills | Status: DC

## 2020-11-29 MED ORDER — OMNIPOD 5 DEXG7G6 INTRO GEN 5 KIT: 1.0000 | PACK | Freq: Once | 0 refills | Status: AC

## 2020-11-29 MED ORDER — DEXCOM G6 TRANSMITTER MISC: 1.0000 | Freq: Once | 1 refills | Status: AC

## 2020-12-01 DIAGNOSIS — E1065 Type 1 diabetes mellitus with hyperglycemia: Secondary | ICD-10-CM | POA: Diagnosis not present

## 2020-12-01 NOTE — Telephone Encounter (Signed)
Good morning, Yes they will also call you. So if you see an 800 # you are not familiar with it may be them. They call you to confirm information and not answering slows the process.  Jenean Lindau

## 2020-12-05 ENCOUNTER — Other Ambulatory Visit (HOSPITAL_COMMUNITY): Payer: Self-pay

## 2020-12-05 ENCOUNTER — Telehealth: Payer: Self-pay | Admitting: Pharmacy Technician

## 2020-12-05 NOTE — Telephone Encounter (Signed)
Patient Advocate Encounter   Received notification from [employer] that prior authorization for DEXCOM G6 TRANSMITTER OR SENSOR is required.   PA submitted on 10.10.22 Transmitter Key: [id number] Sensor Key: [id number] Status is pending    [facility] will continue to follow   Montez Morita, CPhT Patient Advocate [facility] Phone: [phone] Fax:  [phone]

## 2020-12-12 ENCOUNTER — Other Ambulatory Visit: Payer: Self-pay | Admitting: Endocrinology

## 2020-12-12 ENCOUNTER — Telehealth: Payer: Self-pay | Admitting: Nutrition

## 2020-12-12 ENCOUNTER — Other Ambulatory Visit (HOSPITAL_COMMUNITY): Payer: Self-pay

## 2020-12-12 ENCOUNTER — Encounter: Payer: Medicaid Other | Admitting: Nutrition

## 2020-12-12 DIAGNOSIS — E1065 Type 1 diabetes mellitus with hyperglycemia: Secondary | ICD-10-CM

## 2020-12-12 NOTE — Telephone Encounter (Signed)
[facility] Patient Advocate Encounter  Prior Authorization for Dexcom has been approved.    PA# [id number] Sensor PA# [id number] Transmitter Effective dates:  through 06/05/21  Patients co-pay is $0 for both.  Spoke with [employer]. They sent to local [facility] on [address]. They were able to process, but need to order. They will contact pt once they have it.   Sherilyn Dacosta, CPhT Patient Advocate  [facility] Phone: [phone] Fax:  [phone]

## 2020-12-19 ENCOUNTER — Encounter: Payer: Medicaid Other | Attending: Endocrinology | Admitting: Nutrition

## 2020-12-19 ENCOUNTER — Other Ambulatory Visit: Payer: Self-pay

## 2020-12-19 DIAGNOSIS — E1065 Type 1 diabetes mellitus with hyperglycemia: Secondary | ICD-10-CM | POA: Insufficient documentation

## 2020-12-19 NOTE — Patient Instructions (Addendum)
Read over pump manual Call pump help line if questions.

## 2020-12-19 NOTE — Progress Notes (Signed)
Patient was trained on the OmniPod 5 pump.  Her son is on this pump, so she is familiar with this.  Her dexcom was linked to [facility], and her OmniPod 5.  This was also linked to [facility].   She was shown how to bolus, fill and apply the pod, switch to automated mode, and how to respond to alerts and alarms.  She filled a pod with Humalog insulin and started the pod at 12PM.  She was then switched to automated mode, but reminded that she will still need to do correction doses as, it will take at least 2 pod changes before pump will respond appropriately.  We reviewed the checklist and she signed off, saying she understands all topics with no final questions.

## 2020-12-19 NOTE — Telephone Encounter (Signed)
Pump start appointment scheduled for 12/19/20

## 2020-12-20 ENCOUNTER — Other Ambulatory Visit: Payer: Medicaid Other

## 2020-12-22 ENCOUNTER — Ambulatory Visit: Payer: Medicaid Other | Admitting: Endocrinology

## 2020-12-26 ENCOUNTER — Other Ambulatory Visit (HOSPITAL_COMMUNITY): Payer: Self-pay | Admitting: Psychiatry

## 2020-12-26 DIAGNOSIS — F419 Anxiety disorder, unspecified: Secondary | ICD-10-CM

## 2020-12-27 ENCOUNTER — Encounter: Payer: Medicaid Other | Admitting: Nutrition

## 2021-01-02 DIAGNOSIS — E1065 Type 1 diabetes mellitus with hyperglycemia: Secondary | ICD-10-CM | POA: Diagnosis not present

## 2021-01-05 DIAGNOSIS — E1065 Type 1 diabetes mellitus with hyperglycemia: Secondary | ICD-10-CM

## 2021-01-09 MED ORDER — OMNIPOD 5 DEXG7G6 PODS GEN 5 MISC: 1.0000 | 3 refills | Status: DC

## 2021-01-09 MED ORDER — DEXCOM G6 SENSOR MISC: 3 refills | Status: DC

## 2021-01-10 NOTE — Telephone Encounter (Signed)
Pt's mom picked up prescription

## 2021-01-11 MED ORDER — DEXCOM G6 TRANSMITTER MISC: 1 refills | Status: DC

## 2021-01-11 NOTE — Addendum Note (Signed)
Addended by: Eliseo Squires on: 01/11/2021 10:23 AM   Modules accepted: Orders

## 2021-01-21 ENCOUNTER — Other Ambulatory Visit (HOSPITAL_COMMUNITY): Payer: Self-pay | Admitting: Psychiatry

## 2021-01-21 DIAGNOSIS — F419 Anxiety disorder, unspecified: Secondary | ICD-10-CM

## 2021-01-27 ENCOUNTER — Other Ambulatory Visit: Payer: Self-pay

## 2021-01-27 ENCOUNTER — Encounter (HOSPITAL_COMMUNITY): Payer: Self-pay | Admitting: Psychiatry

## 2021-01-27 ENCOUNTER — Telehealth (HOSPITAL_BASED_OUTPATIENT_CLINIC_OR_DEPARTMENT_OTHER): Payer: Medicaid Other | Admitting: Psychiatry

## 2021-01-27 DIAGNOSIS — F331 Major depressive disorder, recurrent, moderate: Secondary | ICD-10-CM

## 2021-01-27 DIAGNOSIS — F419 Anxiety disorder, unspecified: Secondary | ICD-10-CM | POA: Diagnosis not present

## 2021-01-27 MED ORDER — BUPROPION HCL ER (XL) 300 MG PO TB24: 300.0000 mg | ORAL_TABLET | Freq: Every day | ORAL | 0 refills | Status: DC

## 2021-01-27 MED ORDER — LORAZEPAM 0.5 MG PO TABS: ORAL_TABLET | ORAL | 0 refills | Status: AC

## 2021-01-27 MED ORDER — LAMOTRIGINE 150 MG PO TABS: 300.0000 mg | ORAL_TABLET | Freq: Every day | ORAL | 0 refills | Status: DC

## 2021-01-27 MED ORDER — TRAZODONE HCL 100 MG PO TABS: 100.0000 mg | ORAL_TABLET | Freq: Every evening | ORAL | 1 refills | Status: DC | PRN

## 2021-01-27 NOTE — Progress Notes (Signed)
Virtual Visit via Telephone Note  I connected with Sierra Schneider on 01/27/21 at 11:20 AM EST by telephone and verified that I am speaking with the correct person using two identifiers.  Location: Patient: Work Provider: Biomedical scientist   I discussed the limitations, risks, security and privacy concerns of performing an evaluation and management service by telephone and the availability of in person appointments. I also discussed with the patient that there may be a patient responsible charge related to this service. The patient expressed understanding and agreed to proceed.   History of Present Illness: Patient is evaluated by phone session.  On the last visit we increased trazodone and she really like higher dose.  Interestingly she does not need every night and she feels it does not make her groggy next day.  Job stressful but manageable.  She noticed some time anxiety and nervousness especially when she go outside grocery or shopping.  Patient works as a Development worker, community for a company.  Patient very happy that the brother doing very well and now he is getting married next fall and recently bought a house.  Patient told his brother and his girlfriend are together for more than 7 years.  She is little disappointed because her 40 year old son did not go back to school but he is working in Architect and is on long-term goal is Financial planner.  Patient compliant with medication.  She has no rash, itching tremors or shakes.  Her blood sugar is stable since she had a insulin pump.  She does not want to change the medication.  She has taken few times Ativan that help her anxiety.  Patient denies any panic attack.  She denies any crying spells.  Past Psychiatric History: Reviewed. H/O depression after her daughter born.  Took Prozac, hydroxyzine, increased Wellbutrin, Effexor, Zoloft and Cymbalta. No H/O suicidal attempt or inpatient, mania and psychosis.    Psychiatric Specialty  Exam: Physical Exam  Review of Systems  Weight 150 lb (68 kg).There is no height or weight on file to calculate BMI.  General Appearance: NA  Eye Contact:  NA  Speech:  Normal Rate  Volume:  Normal  Mood:  Anxious  Affect:  NA  Thought Process:  Goal Directed  Orientation:  Full (Time, Place, and Person)  Thought Content:  Logical  Suicidal Thoughts:  No  Homicidal Thoughts:  No  Memory:  Immediate;   Good Recent;   Good Remote;   Good  Judgement:  Good  Insight:  Good  Psychomotor Activity:  NA  Concentration:  Concentration: Good and Attention Span: Good  Recall:  Good  Fund of Knowledge:  Good  Language:  Good  Akathisia:  No  Handed:  Right  AIMS (if indicated):     Assets:  Communication Skills Desire for Improvement Housing Social Support Transportation  ADL's:  Intact  Cognition:  WNL  Sleep:   much better with trazodone      Assessment and Plan: Major depressive disorder, recurrent.  Anxiety.  Patient doing better on Wellbutrin, Lamictal and trazodone which she takes as needed.  She also takes Ativan for severe anxiety and the last prescription was given in August but she still has a few left.  Since holidays are coming we will provide another prescription of Ativan 0.5 mg #20 in case she feels overwhelmed.  Her holidays were good and she is hoping to have a better Christmas.  Discussed medication side effects and benefits.  Continue Wellbutrin XL  300 mg daily, trazodone 100 mg to take as needed and Lamictal 150 mg twice a day.  Recommended to call us back if is any question or any concern.  Follow-up in 3 months.  Follow Up Instructions:    I discussed the assessment and treatment plan with the patient. The patient was provided an opportunity to ask questions and all were answered. The patient agreed with the plan and demonstrated an understanding of the instructions.   The patient was advised to call back or seek an in-person evaluation if the symptoms  worsen or if the condition fails to improve as anticipated.  I provided 26 minutes of non-face-to-face time during this encounter.   Cleotis Nipper, MD

## 2021-02-07 ENCOUNTER — Telehealth (HOSPITAL_COMMUNITY): Payer: Medicaid Other | Admitting: Psychiatry

## 2021-02-13 ENCOUNTER — Other Ambulatory Visit: Payer: Medicaid Other

## 2021-02-15 ENCOUNTER — Telehealth (HOSPITAL_COMMUNITY): Payer: Medicaid Other | Admitting: Psychiatry

## 2021-02-16 ENCOUNTER — Ambulatory Visit: Payer: Medicaid Other | Admitting: Endocrinology

## 2021-02-20 ENCOUNTER — Other Ambulatory Visit: Payer: Self-pay | Admitting: Endocrinology

## 2021-02-20 DIAGNOSIS — E1065 Type 1 diabetes mellitus with hyperglycemia: Secondary | ICD-10-CM

## 2021-03-07 ENCOUNTER — Telehealth: Payer: Self-pay

## 2021-03-07 ENCOUNTER — Other Ambulatory Visit: Payer: Self-pay | Admitting: Endocrinology

## 2021-03-07 DIAGNOSIS — E1065 Type 1 diabetes mellitus with hyperglycemia: Secondary | ICD-10-CM

## 2021-03-07 NOTE — Telephone Encounter (Signed)
Patient is requesting refill on Humalog but has not been seen since July and has missed her last 2 appts. Please advise

## 2021-03-08 ENCOUNTER — Ambulatory Visit (HOSPITAL_COMMUNITY)
Admission: EM | Admit: 2021-03-08 | Discharge: 2021-03-08 | Disposition: A | Discharge status: Home / Self Care | Payer: Medicaid Other | Attending: Family Medicine | Admitting: Family Medicine

## 2021-03-08 ENCOUNTER — Other Ambulatory Visit: Payer: Self-pay

## 2021-03-08 ENCOUNTER — Encounter (HOSPITAL_COMMUNITY): Payer: Self-pay | Admitting: Emergency Medicine

## 2021-03-08 DIAGNOSIS — G43819 Other migraine, intractable, without status migrainosus: Secondary | ICD-10-CM

## 2021-03-08 DIAGNOSIS — E1065 Type 1 diabetes mellitus with hyperglycemia: Secondary | ICD-10-CM

## 2021-03-08 MED ORDER — KETOROLAC TROMETHAMINE 60 MG/2ML IM SOLN
INTRAMUSCULAR | Status: AC
  Filled 2021-03-08: qty 2

## 2021-03-08 MED ORDER — DEXAMETHASONE SODIUM PHOSPHATE 10 MG/ML IJ SOLN
INTRAMUSCULAR | Status: AC
  Filled 2021-03-08: qty 1

## 2021-03-08 MED ORDER — METOCLOPRAMIDE HCL 5 MG/ML IJ SOLN
INTRAMUSCULAR | Status: AC
  Filled 2021-03-08: qty 2

## 2021-03-08 MED ORDER — INSULIN LISPRO 100 UNIT/ML IJ SOLN: INTRAMUSCULAR | 0 refills | Status: DC

## 2021-03-08 MED ORDER — ONDANSETRON 4 MG PO TBDP
4.0000 mg | ORAL_TABLET | Freq: Once | ORAL | Status: AC
  Administered 2021-03-08: 4 mg via ORAL

## 2021-03-08 MED ORDER — METOCLOPRAMIDE HCL 5 MG/ML IJ SOLN
5.0000 mg | Freq: Once | INTRAMUSCULAR | Status: AC
  Administered 2021-03-08: 5 mg via INTRAMUSCULAR

## 2021-03-08 MED ORDER — SUMATRIPTAN SUCCINATE 6 MG/0.5ML ~~LOC~~ SOLN
6.0000 mg | Freq: Once | SUBCUTANEOUS | Status: AC
  Administered 2021-03-08: 6 mg via SUBCUTANEOUS

## 2021-03-08 MED ORDER — ONDANSETRON 4 MG PO TBDP
ORAL_TABLET | ORAL | Status: AC
  Filled 2021-03-08: qty 1

## 2021-03-08 MED ORDER — KETOROLAC TROMETHAMINE 60 MG/2ML IM SOLN
60.0000 mg | Freq: Once | INTRAMUSCULAR | Status: AC
  Administered 2021-03-08: 60 mg via INTRAMUSCULAR

## 2021-03-08 MED ORDER — DEXAMETHASONE SODIUM PHOSPHATE 10 MG/ML IJ SOLN
10.0000 mg | Freq: Once | INTRAMUSCULAR | Status: AC
  Administered 2021-03-08: 10 mg via INTRAMUSCULAR

## 2021-03-08 MED ORDER — ONDANSETRON 4 MG PO TBDP: 4.0000 mg | ORAL_TABLET | Freq: Three times a day (TID) | ORAL | 0 refills | Status: AC | PRN

## 2021-03-08 MED ORDER — SUMATRIPTAN SUCCINATE 6 MG/0.5ML ~~LOC~~ SOLN
SUBCUTANEOUS | Status: AC
  Filled 2021-03-08: qty 0.5

## 2021-03-08 NOTE — Discharge Instructions (Addendum)
Meds ordered this encounter  Medications   metoCLOPramide (REGLAN) injection 5 mg   dexamethasone (DECADRON) injection 10 mg   SUMAtriptan (IMITREX) injection 6 mg   ketorolac (TORADOL) injection 60 mg   ondansetron (ZOFRAN-ODT) disintegrating tablet 4 mg

## 2021-03-08 NOTE — ED Triage Notes (Signed)
C/o migraine on left side x 4 days that has been constant with n/v, light sensitivity.

## 2021-03-09 ENCOUNTER — Ambulatory Visit: Payer: Self-pay

## 2021-03-11 NOTE — ED Provider Notes (Signed)
[place]   [id number] 03/08/21 Arrival Time: [id number]  ASSESSMENT & PLAN:  1. Other migraine without status migrainosus, intractable    Meds ordered this encounter  Medications   metoCLOPramide (REGLAN) injection 5 mg   dexamethasone (DECADRON) injection 10 mg   SUMAtriptan (IMITREX) injection 6 mg   ketorolac (TORADOL) injection 60 mg   ondansetron (ZOFRAN-ODT) disintegrating tablet 4 mg   ondansetron (ZOFRAN-ODT) 4 MG disintegrating tablet    Sig: Take 1 tablet (4 mg total) by mouth every 8 (eight) hours as needed for nausea or vomiting.    Dispense:  15 tablet    Refill:  0   Normal neurological exam. Without fever, focal neuro logical deficits, nuchal rigidity, or change in vision. No indication for neurodiagnostic workup at this time. Discussed. Discharged to home.  Recommend:  Follow-up Information     Ezequiel Essex, MD.   Specialty: Family Medicine Why: As needed. Contact information: [address] [place] [address] [phone]         [place].   Specialty: Emergency Medicine Why: If worsening or failing to improve as anticipated. Contact information: [address] [id number] [place] [phone]                 Reviewed expectations re: course of current medical issues. Questions answered. Outlined signs and symptoms indicating need for more acute intervention. Patient verbalized understanding. After Visit Summary given.   SUBJECTIVE: History from: Patient Patient is able to give a clear and coherent history.  Sierra Schneider is a 36 y.o. female who presents with complaint of a migraine headache. Onset gradual, past 4 days. Location:  L sided and frontal  without radiation. History of headaches: yes. Precipitating factors include: none which have been determined. Associated symptoms: Preceding aura: no. Nausea/vomiting: yes. Vision changes:  no. Increased sensitivity to light and to noises: yes. Fever: no. Sinus pressure/congestion: no. Extremity weakness: no. Home treatment has included  OTC analgesics  with no improvement. Current headache has limited normal daily activities. Denies loss of balance, numbness of extremities, and speech difficulties. No head injury reported. Ambulatory without difficulty. No recent travel.  ROS: As per HPI. All other systems negative.    OBJECTIVE:  Vitals:   03/08/21 1628  BP: 133/85  Pulse: 71  Resp: 15  Temp: 98.9 F (37.2 C)  TempSrc: Oral  SpO2: 98%    General appearance: alert; fatigued-appearing; prefers darkened room HENT: normocephalic; atraumatic Eyes: PERRLA; EOMI; conjunctivae normal Neck: supple with FROM Lungs: clear to auscultation bilaterally; unlabored respirations Heart: regular rate and rhythm Extremities: no edema; symmetrical with no gross deformities Skin: warm and dry Neurologic: alert; speech is fluent and clear without dysarthria or aphasia; CN 2-12 grossly intact; no facial droop; normal gait; normal symmetric reflexes; normal extremity strength and sensation throughout Psychological: alert and cooperative; normal mood and affect  No Known Allergies  Past Medical History:  Diagnosis Date   Depression    Diabetes mellitus 08/2009   late onset type 1   Social History   Socioeconomic History   Marital status: Single    Spouse name: Not on file   Number of children: Not on file   Years of education: Not on file   Highest education level: Not on file  Occupational History   Not on file  Tobacco Use   Smoking status: Former    Packs/day: 0.00    Types: Cigarettes    Quit  date: 01/26/2014    Years since quitting: 7.1   Smokeless tobacco: Never  Vaping Use   Vaping Use: Never used  Substance and Sexual Activity   Alcohol use: Yes    Comment: 1 drink every 3 months   Drug use: No   Sexual activity: Yes    Birth control/protection:  I.U.D.    Comment: mirena  Other Topics Concern   Not on file  Social History Narrative   Patient goes to school full-time at [employer]. Patient works part-time at [employer]. Is engaged. Having relationship difficulties. Has 2 children-ages 2 and 8.   Social Determinants of Health   Financial Resource Strain: Not on file  Food Insecurity: Not on file  Transportation Needs: Not on file  Physical Activity: Not on file  Stress: Not on file  Social Connections: Not on file  Intimate Partner Violence: Not on file   Family History  Problem Relation Age of Onset   Depression Mother    Hypertension Father    Hyperlipidemia Father    Kidney disease Paternal Grandmother    Diabetes Maternal Grandfather    ADD / ADHD Brother    Depression Brother    Drug abuse Brother    Depression Maternal Aunt    Depression Paternal Aunt    Psychosis Cousin    Heart disease Neg Hx    Past Surgical History:  Procedure Laterality Date   CESAREAN SECTION     ESOPHAGOGASTRODUODENOSCOPY Left 10/14/2012   Procedure: ESOPHAGOGASTRODUODENOSCOPY (EGD);  Surgeon: Winfield Cunas., MD;  Location: [facility] ENDOSCOPY;  Service: Endoscopy;  Laterality: Left;   WISDOM TOOTH EXTRACTION        Vanessa Kick, MD 03/11/21 1046

## 2021-03-14 ENCOUNTER — Other Ambulatory Visit: Payer: Self-pay | Admitting: Endocrinology

## 2021-03-14 DIAGNOSIS — E1065 Type 1 diabetes mellitus with hyperglycemia: Secondary | ICD-10-CM

## 2021-03-17 ENCOUNTER — Other Ambulatory Visit: Payer: Self-pay

## 2021-03-17 ENCOUNTER — Other Ambulatory Visit (INDEPENDENT_AMBULATORY_CARE_PROVIDER_SITE_OTHER): Payer: No Typology Code available for payment source

## 2021-03-17 DIAGNOSIS — E1065 Type 1 diabetes mellitus with hyperglycemia: Secondary | ICD-10-CM

## 2021-03-17 DIAGNOSIS — E7800 Pure hypercholesterolemia, unspecified: Secondary | ICD-10-CM | POA: Diagnosis not present

## 2021-03-17 LAB — BASIC METABOLIC PANEL
BUN: 8 mg/dL (ref 6–23)
CO2: 30 mEq/L (ref 19–32)
Calcium: 8.8 mg/dL (ref 8.4–10.5)
Chloride: 103 mEq/L (ref 96–112)
Creatinine, Ser: 0.82 mg/dL (ref 0.40–1.20)
GFR: 91.65 mL/min (ref >60.00)
Glucose, Bld: 123 mg/dL — ABNORMAL HIGH (ref 70–99)
Potassium: 3.9 mEq/L (ref 3.5–5.1)
Sodium: 139 mEq/L (ref 135–145)

## 2021-03-17 LAB — MICROALBUMIN / CREATININE URINE RATIO
Creatinine,U: 129.2 mg/dL
Microalb Creat Ratio: 0.6 mg/g (ref 0.0–30.0)
Microalb, Ur: 0.8 mg/dL (ref 0.0–1.9)

## 2021-03-17 LAB — HEMOGLOBIN A1C: Hgb A1c MFr Bld: 6.1 % (ref 4.6–6.5)

## 2021-03-17 LAB — LDL CHOLESTEROL, DIRECT: Direct LDL: 66 mg/dL

## 2021-03-20 ENCOUNTER — Other Ambulatory Visit (HOSPITAL_COMMUNITY): Payer: Self-pay | Admitting: Psychiatry

## 2021-03-20 DIAGNOSIS — F419 Anxiety disorder, unspecified: Secondary | ICD-10-CM

## 2021-03-22 ENCOUNTER — Other Ambulatory Visit: Payer: Self-pay | Admitting: Endocrinology

## 2021-03-22 ENCOUNTER — Encounter: Payer: Self-pay | Admitting: Endocrinology

## 2021-03-22 ENCOUNTER — Ambulatory Visit (INDEPENDENT_AMBULATORY_CARE_PROVIDER_SITE_OTHER): Payer: No Typology Code available for payment source | Admitting: Endocrinology

## 2021-03-22 ENCOUNTER — Other Ambulatory Visit: Payer: Self-pay

## 2021-03-22 VITALS — BP 106/70 | HR 88 | Ht 63.0 in | Wt 154.4 lb

## 2021-03-22 DIAGNOSIS — E7800 Pure hypercholesterolemia, unspecified: Secondary | ICD-10-CM | POA: Diagnosis not present

## 2021-03-22 DIAGNOSIS — E1065 Type 1 diabetes mellitus with hyperglycemia: Secondary | ICD-10-CM | POA: Diagnosis not present

## 2021-03-22 NOTE — Progress Notes (Signed)
Patient ID: Sierra Schneider, female   DOB: September 22, 1985, 36 y.o.   MRN: [id number]   Reason for Appointment: Diabetes followup:   History of Present Illness   Diagnosis: Type 1 DIABETES MELITUS     DIABETES history: She has had diabetes since about 2011 and has been on insulin pump for several years She had fairly good control in the first 2 years of her disease but blood sugar control had been more difficult subsequently  CURRENT insulin pump: OmniPod 5  CURRENT insulin: HUMALOG  SETTINGS are:    Basal rate on current download = Midnight = 1.3, 7 AM = 1.4, 10 AM = 0.6, 12 noon = 1.3, 6 PM = 1.05, 7 PM = 0.9, 10 PM = 1.0  Total basal insulin = about 24 units Carb Ratio: 1: 7 until 12 noon and then 1:9.  1: 14 at 5 PM Correction factor 1: 35, target 110 Active insulin time 3 hours    Current management, blood sugar patterns and problems identified:  Her A1c has improved significantly at 6.1, previously was up to 7.5  Pump data:  Recent daily insulin dose about 50 units with 55% in basal   BLOOD SUGAR PATTERNS, management and problems:  She has much better control with the OmniPod pump compared to the Medtronic and she finds the Dexcom sensor much better to use Overall her blood sugars appear to be more stable and less hypoglycemia with better time in range Recently appears to be getting more insulin in the form of her basal and less in boluses compared to her previous pump However she still has somewhat inconsistent postprandial blood sugars based on what she is eating, timing of her bolus and estimation of what she is eating Although hypoglycemia is overall less she is sometimes has low sugars in the afternoons including before suppertime if she is eating only a light or no lunch This is despite not being very active or exercising Weight is overall about the same She is at times getting a little higher fat intake or sweets  Blood sugar patterns from CGM analysis  show the following:  Her blood sugars are on an average within the target range almost all of the 24 hours except late at night for short time LOWEST blood sugars are in the early morning hours and highest readings either after lunch are bedtime  She still has significant variability after lunch and dinner with periodic tendency to blood sugars going up late evening Occasionally blood sugars may be low normal or slightly low before dinner followed by rebound OVERNIGHT blood sugars are relatively stable for the most part without hypoglycemia and only rarely will have a transient low sugar, once followed by 30 g carbohydrate bolus; pump will suspend periodically during the night to maintain euglycemia  POSTPRANDIAL readings are on an average not rising excessively looking at the median readings but on some days blood sugars are more significantly spiking after either lunch or dinner  Average highest reading at about 10 PM = 188 Premeal readings are relatively good before dinnertime with median 114 and occasional hypoglycemia as above  CGM use % of time 99  2-week average/GV 149  Time in range        75%  % Time Above 180 16  % Time above 250 7  % Time Below 70 1     Previous data:  CGM use % of time 84  2-week average/GV 155/52  Time in range    69    % was 68   % Time Above 180 24+5  % Time above 250   % Time Below 70 2     PRE-MEAL Fasting Lunch Dinner Bedtime Overall  Glucose range:       Averages: 175 145      2-hour POST-MEAL PC Breakfast PC Lunch PC Dinner  Glucose range:     Averages: 143 146 173       Wt Readings from Last 3 Encounters:  03/22/21 154 lb 6.4 oz (70 kg)  09/15/20 151 lb 9.6 oz (68.8 kg)  05/10/20 154 lb 12.8 oz (70.2 kg)     LABS:  Lab Results  Component Value Date   HGBA1C 6.1 03/17/2021   HGBA1C 7.5 (H) 09/07/2020   HGBA1C 6.9 (A) 05/10/2020    Lab Results  Component Value Date   MICROALBUR 0.8 03/17/2021   LDLCALC 108 (H) 09/07/2020   CREATININE 0.82 03/17/2021    OTHER active problems discussed today: See review of systems    Allergies as of 03/22/2021   No Known Allergies      Medication List        Accurate as of March 22, 2021  4:25 PM. If you have any questions, ask your nurse or doctor.          Accu-Chek Guide test strip Generic drug: glucose blood PLEASE USE TO CHECK BLOOD SUGAR TWICE DAILY   buPROPion 300 MG 24 hr tablet Commonly known as: WELLBUTRIN XL Take 1 tablet (300 mg total) by mouth daily.   Cetirizine HCl 10 MG Caps Take 1 capsule (10 mg total) by mouth daily for 10 days.   Dexcom G6 Sensor Misc Use to monitor blood sugar, change after 10 days   Dexcom G6 Transmitter Misc Change every 3 months   fluticasone 50 MCG/ACT nasal spray Commonly known as: FLONASE Place 1-2 sprays into both nostrils daily.   insulin lispro 100 UNIT/ML injection Commonly known as: HumaLOG USE MAX 80 UNITS DAILY VIA INSULIN PUMP.   lamoTRIgine 150 MG tablet Commonly known as: LAMICTAL Take 2 tablets (300 mg total) by mouth daily.   LORazepam 0.5 MG tablet Commonly known as: ATIVAN TAKE 1 TABLET BY MOUTH EVERY DAY AS NEEDED FOR ANXIETY   Omnipod 5 G6 Pod (Gen 5) Misc 1 Device by Does not apply route every 3 (three) days.   ondansetron 4 MG disintegrating tablet Commonly known as: ZOFRAN-ODT Take 1 tablet (4 mg total) by mouth every 8 (eight) hours as needed for nausea or vomiting.   rosuvastatin 10 MG tablet Commonly known as: CRESTOR 1 tablet   traZODone 100 MG tablet Commonly known as: DESYREL Take 1 tablet (100 mg total) by mouth at bedtime as needed for sleep.        Allergies: No Known Allergies  Past Medical History:  Diagnosis Date   Depression    Diabetes mellitus 08/2009   late onset type 1    Past Surgical History:  Procedure Laterality Date   CESAREAN SECTION      ESOPHAGOGASTRODUODENOSCOPY Left 10/14/2012   Procedure: ESOPHAGOGASTRODUODENOSCOPY (EGD);  Surgeon: Winfield Cunas., MD;  Location: [facility] ENDOSCOPY;  Service: Endoscopy;  Laterality: Left;   WISDOM TOOTH EXTRACTION      Family History  Problem Relation Age of Onset   Depression Mother    Hypertension Father    Hyperlipidemia Father    Kidney disease Paternal Grandmother    Diabetes Maternal Grandfather  ADD / ADHD Brother    Depression Brother    Drug abuse Brother    Depression Maternal Aunt    Depression Paternal Aunt    Psychosis Cousin    Heart disease Neg Hx     Social History:  reports that she quit smoking about 7 years ago. Her smoking use included cigarettes. She has never used smokeless tobacco. She reports current alcohol use. She reports that she does not use drugs.  REVIEW of systems:   HYPERCHOLESTEROLEMIA,  Because of her family history of hypercholesterolemia and her diabetes she was previously on pravastatin, on Crestor 20 mg with baseline of LDL of 146  LDL well controlled except in 7/22, now direct LDL is 66  Back Lab Results  Component Value Date   CHOL 185 09/07/2020   CHOL 148 06/02/2019   CHOL 138 09/10/2018   Lab Results  Component Value Date   HDL 62.70 09/07/2020   HDL 55.60 06/02/2019   HDL 52.80 09/10/2018   Lab Results  Component Value Date   LDLCALC 108 (H) 09/07/2020   LDLCALC 77 06/02/2019   LDLCALC 69 09/10/2018    Lab Results  Component Value Date   TRIG 72.0 09/07/2020   TRIG 75.0 06/02/2019   TRIG 81.0 09/10/2018   Lab Results  Component Value Date   CHOLHDL 3 09/07/2020   CHOLHDL 3 06/02/2019   CHOLHDL 3 09/10/2018   Lab Results  Component Value Date   LDLDIRECT 66.0 03/17/2021   LDLDIRECT 129.4 12/29/2012    She had mild respiratory symptoms from Covid infection in October 2021 She has had her second Covid shot    EXAM:  BP 106/70    Pulse 88    Ht 5\' 3"  (1.6 m)    Wt 154 lb 6.4 oz (70 kg)    SpO2 98%     BMI 27.35 kg/m    ASSESSMENT:    Type I diabetes on OmniPod 5 system  See history of present illness for detailed discussion of  current management, blood sugar patterns and problems identified  Her A1c is much better at 6.1   A1c appears to be lower than expected for her recent GMI of 6.9 She is getting better time in range and now 75% compared to 69 with less hypoglycemia As before she has periodic rise in blood sugars after meals both at lunch and dinner This is related to variability in the diet, inadequate or late boluses for meals and some snacks Also not doing correction readings and blood sugars are persistently high  Not clear why she has tendency to relatively lower readings in the late afternoon, may be related to generally being more active Also will sometimes have unexpected high readings in the evenings with or without food  Currently with the target 110 she has excellent control overnight  Urine microalbumin normal  LIPIDS: LDL improved at 66  Recommendations:  Increase setting of the target blood sugar to 130 between 12 noon and 6 PM at least and continue 100 the rest of the time She will try to bolus consistently at every meal and also needs to bolus consistently before starting to eat If she is late for a bolus she will reduce the bolus amount No change in carb ratio at this time Additional boluses when she is eating any higher fat meals or snacks at night Take more frequent correction boluses if blood sugars are consistently high in the evenings Encouraged her to start regular exercise and also  reminded her to use the activity mode when planning to exercise  Hypercholesterolemia: Continue Crestor  Needs more regular follow-up   Patient Instructions  Target 130 from 12pm to 6 pm Bolus before each meal  Add extra 2-4 U for hi fat meals  Correct readings at nite if not coming down      Elayne Snare 03/22/2021, 4:25 PM

## 2021-03-22 NOTE — Patient Instructions (Signed)
Target 130 from 12pm to 6 pm Bolus before each meal  Add extra 2-4 U for hi fat meals  Correct readings at nite if not coming down

## 2021-03-24 ENCOUNTER — Other Ambulatory Visit: Payer: Self-pay | Admitting: Endocrinology

## 2021-03-24 DIAGNOSIS — E1065 Type 1 diabetes mellitus with hyperglycemia: Secondary | ICD-10-CM

## 2021-03-31 ENCOUNTER — Other Ambulatory Visit: Payer: Self-pay | Admitting: Endocrinology

## 2021-03-31 DIAGNOSIS — E1065 Type 1 diabetes mellitus with hyperglycemia: Secondary | ICD-10-CM

## 2021-04-07 ENCOUNTER — Encounter: Payer: Self-pay | Admitting: Endocrinology

## 2021-04-12 ENCOUNTER — Other Ambulatory Visit (HOSPITAL_COMMUNITY): Payer: Self-pay | Admitting: Psychiatry

## 2021-04-12 DIAGNOSIS — F419 Anxiety disorder, unspecified: Secondary | ICD-10-CM

## 2021-04-20 ENCOUNTER — Other Ambulatory Visit (HOSPITAL_COMMUNITY): Payer: Self-pay | Admitting: *Deleted

## 2021-04-20 DIAGNOSIS — F419 Anxiety disorder, unspecified: Secondary | ICD-10-CM

## 2021-04-20 MED ORDER — TRAZODONE HCL 100 MG PO TABS: 100.0000 mg | ORAL_TABLET | Freq: Every evening | ORAL | 0 refills | Status: DC | PRN

## 2021-04-27 ENCOUNTER — Other Ambulatory Visit: Payer: Self-pay

## 2021-04-27 ENCOUNTER — Encounter (HOSPITAL_COMMUNITY): Payer: Self-pay | Admitting: Psychiatry

## 2021-04-27 ENCOUNTER — Telehealth (HOSPITAL_BASED_OUTPATIENT_CLINIC_OR_DEPARTMENT_OTHER): Payer: No Typology Code available for payment source | Admitting: Psychiatry

## 2021-04-27 DIAGNOSIS — F331 Major depressive disorder, recurrent, moderate: Secondary | ICD-10-CM

## 2021-04-27 DIAGNOSIS — F419 Anxiety disorder, unspecified: Secondary | ICD-10-CM | POA: Diagnosis not present

## 2021-04-27 MED ORDER — BUPROPION HCL ER (XL) 300 MG PO TB24: 300.0000 mg | ORAL_TABLET | Freq: Every day | ORAL | 0 refills | Status: DC

## 2021-04-27 MED ORDER — TRAZODONE HCL 100 MG PO TABS: 100.0000 mg | ORAL_TABLET | Freq: Every evening | ORAL | 0 refills | Status: DC | PRN

## 2021-04-27 MED ORDER — LAMOTRIGINE 150 MG PO TABS: 300.0000 mg | ORAL_TABLET | Freq: Every day | ORAL | 0 refills | Status: DC

## 2021-04-27 NOTE — Progress Notes (Signed)
Virtual Visit via Telephone Note ? ?I connected with Sierra Schneider on 04/27/21 at 10:40 AM EST by telephone and verified that I am speaking with the correct person using two identifiers. ? ?Location: ?Patient: Work ?Provider: Home Office ?  ?I discussed the limitations, risks, security and privacy concerns of performing an evaluation and management service by telephone and the availability of in person appointments. I also discussed with the patient that there may be a patient responsible charge related to this service. The patient expressed understanding and agreed to proceed. ? ? ?History of Present Illness: ?Patient is evaluated by phone session.  She is at work.  She feels things are going very well as brother is a stable and that helps the whole family.  She reported mother is also doing very well.  Patient reported brother and his girlfriend decided to get married next year and she started shopping with her future sister-in-law and she likes a lot.  Patient reported she does require trazodone almost every night because she is sleeping better with the trazodone otherwise she do not sleep well.  She is pleased that her 37 year old son is working and happy with his job.  Recently patient has a visit with her endocrinologist and she was pleased that her hemoglobin A1c further improved.  She is on a different insulin pump.  Patient denies any irritability, panic attack, anger, crying spells or any feeling of hopelessness or worthlessness.  Her job is busy but going well.  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  Patient denies any tremors or shakes.  She admitted took 2 times Ativan around Christmas when she was feeling overwhelmed and does not feel she needed any more refills.  Patient like to keep the current medication. ? ?Past Psychiatric History: Reviewed. ?H/O depression after her daughter born.  Took Prozac, hydroxyzine, increased Wellbutrin, Effexor, Zoloft and Cymbalta. No H/O  suicidal attempt or inpatient, mania and psychosis.  ? ?Recent Results (from the past 2160 hour(s))  ?LDL cholesterol, direct     Status: None  ? Collection Time: 03/17/21  8:26 AM  ?Result Value Ref Range  ? Direct LDL 66.0 mg/dL  ?  Comment: Optimal:  <100 mg/dLNear or Above Optimal:  100-129 mg/dLBorderline High:  130-159 mg/dLHigh:  160-189 mg/dLVery High:  >190 mg/dL  ?Microalbumin / creatinine urine ratio     Status: None  ? Collection Time: 03/17/21  8:26 AM  ?Result Value Ref Range  ? Microalb, Ur 0.8 0.0 - 1.9 mg/dL  ? Creatinine,U 129.2 mg/dL  ? Microalb Creat Ratio 0.6 0.0 - 30.0 mg/g  ?Basic metabolic panel     Status: Abnormal  ? Collection Time: 03/17/21  8:26 AM  ?Result Value Ref Range  ? Sodium 139 135 - 145 mEq/L  ? Potassium 3.9 3.5 - 5.1 mEq/L  ? Chloride 103 96 - 112 mEq/L  ? CO2 30 19 - 32 mEq/L  ? Glucose, Bld 123 (H) 70 - 99 mg/dL  ? BUN 8 6 - 23 mg/dL  ? Creatinine, Ser 0.82 0.40 - 1.20 mg/dL  ? GFR 91.65 >60.00 mL/min  ?  Comment: Calculated using the CKD-EPI Creatinine Equation (2021)  ? Calcium 8.8 8.4 - 10.5 mg/dL  ?Hemoglobin A1c     Status: None  ? Collection Time: 03/17/21  8:26 AM  ?Result Value Ref Range  ? Hgb A1c MFr Bld 6.1 4.6 - 6.5 %  ?  Comment: Glycemic Control Guidelines for People with Diabetes:Non  Diabetic:  <6%Goal of Therapy: <7%Additional Action Suggested:  >8%   ?  ?Psychiatric Specialty Exam: ?Physical Exam  ?Review of Systems  ?Weight 154 lb (69.9 kg).There is no height or weight on file to calculate BMI.  ?General Appearance: NA  ?Eye Contact:  NA  ?Speech:  Clear and Coherent and Normal Rate  ?Volume:  Normal  ?Mood:  Euthymic  ?Affect:  NA  ?Thought Process:  Goal Directed  ?Orientation:  Full (Time, Place, and Person)  ?Thought Content:  Logical  ?Suicidal Thoughts:  No  ?Homicidal Thoughts:  No  ?Memory:  Immediate;   Good ?Recent;   Good ?Remote;   Good  ?Judgement:  Good  ?Insight:  Good  ?Psychomotor Activity:  NA  ?Concentration:  Concentration: Good and  Attention Span: Good  ?Recall:  Good  ?Fund of Knowledge:  Good  ?Language:  Good  ?Akathisia:  No  ?Handed:  Right  ?AIMS (if indicated):     ?Assets:  Communication Skills ?Desire for Improvement ?Housing ?Resilience ?Social Support ?Talents/Skills ?Transportation  ?ADL's:  Intact  ?Cognition:  WNL  ?Sleep:   good with trazodone  ? ? ? ? ?Assessment and Plan: ?Major depressive disorder, recurrent.  Anxiety. ? ?I reviewed blood work results.  Her hemoglobin A1c is further improved.  Patient doing much better on her current medication.  She took only twice Ativan and she does not need any refills.  We will continue Wellbutrin XL 300 mg daily, trazodone 100 mg at bedtime and Lamictal 150 mg twice a day.  She has no rash or any itching.  Recommended to call us back if she is any question or any concern.  Follow-up in 3 months. ? ?Follow Up Instructions: ? ?  ?I discussed the assessment and treatment plan with the patient. The patient was provided an opportunity to ask questions and all were answered. The patient agreed with the plan and demonstrated an understanding of the instructions. ?  ?The patient was advised to call back or seek an in-person evaluation if the symptoms worsen or if the condition fails to improve as anticipated. ? ?I provided 24 minutes of non-face-to-face time during this encounter. ? ? ?Kathlee Nations, MD  ?

## 2021-05-03 ENCOUNTER — Other Ambulatory Visit: Payer: Self-pay | Admitting: Endocrinology

## 2021-05-03 DIAGNOSIS — E1065 Type 1 diabetes mellitus with hyperglycemia: Secondary | ICD-10-CM

## 2021-05-08 ENCOUNTER — Other Ambulatory Visit (HOSPITAL_COMMUNITY): Payer: Self-pay

## 2021-05-21 ENCOUNTER — Other Ambulatory Visit: Payer: Self-pay | Admitting: Endocrinology

## 2021-05-29 ENCOUNTER — Other Ambulatory Visit (HOSPITAL_COMMUNITY): Payer: Self-pay

## 2021-05-29 ENCOUNTER — Telehealth: Payer: Self-pay | Admitting: Pharmacy Technician

## 2021-05-29 NOTE — Telephone Encounter (Signed)
Patient Advocate Encounter ? ?Received notification from [facility] regarding a prior authorization for [facility] G6 SENSOR/TRANSMIITER. Authorization has been APPROVED from 4.3.23 to 4.3.24.  ? ?Per test claim, copay for 30 days supply is $0 ? ?Authorization #  [id number] ? ? ? ?Received notification from [employer] that prior authorization for [facility] G6 SENSOR AND TRANSMITTER is required. ?  ?PA submitted on 4.3.23 ?SENSOR Key [id number] ?TRANNSMITTER Key: [id number] ?Status is pending ?  ?[facility] will continue to follow ? ?Takari Duncombe R Jarett Dralle, CPhT ?Patient Advocate ? Endocrinology ?Phone: [phone] ?Fax:  [phone] ? ?

## 2021-05-30 ENCOUNTER — Other Ambulatory Visit (HOSPITAL_COMMUNITY): Payer: Self-pay

## 2021-07-13 ENCOUNTER — Telehealth (HOSPITAL_BASED_OUTPATIENT_CLINIC_OR_DEPARTMENT_OTHER): Payer: No Typology Code available for payment source | Admitting: Psychiatry

## 2021-07-13 ENCOUNTER — Encounter (HOSPITAL_COMMUNITY): Payer: Self-pay | Admitting: Psychiatry

## 2021-07-13 DIAGNOSIS — F331 Major depressive disorder, recurrent, moderate: Secondary | ICD-10-CM

## 2021-07-13 DIAGNOSIS — F419 Anxiety disorder, unspecified: Secondary | ICD-10-CM

## 2021-07-13 MED ORDER — LAMOTRIGINE 150 MG PO TABS: 300.0000 mg | ORAL_TABLET | Freq: Every day | ORAL | 0 refills | Status: DC

## 2021-07-13 MED ORDER — TRAZODONE HCL 100 MG PO TABS: 100.0000 mg | ORAL_TABLET | Freq: Every evening | ORAL | 0 refills | Status: DC | PRN

## 2021-07-13 MED ORDER — BUPROPION HCL ER (XL) 300 MG PO TB24: 300.0000 mg | ORAL_TABLET | Freq: Every day | ORAL | 0 refills | Status: DC

## 2021-07-13 NOTE — Progress Notes (Signed)
Virtual Visit via Telephone Note  I connected with Sierra Schneider on 07/13/21 at  8:40 AM EDT by telephone and verified that I am speaking with the correct person using two identifiers.  Location: Patient: Work Provider: Economist   I discussed the limitations, risks, security and privacy concerns of performing an evaluation and management service by telephone and the availability of in person appointments. I also discussed with the patient that there may be a patient responsible charge related to this service. The patient expressed understanding and agreed to proceed.   History of Present Illness: Patient is evaluated by phone session.  She is doing better on her medication.  She has not taken Ativan since the last visit.  She reported her family members are also doing good.  Her mother recently had knee replacement and she is now thinking to take early retirement.  Her mother is working for more than 30 years at the same place.  Patient is pleased with her mother's decision.  She is also very happy that her brother is doing well and remain stable and no major issues from him.  Patient is sleeping good with the help of trazodone.  She lost more than 9 pounds but gradually and slowly.  She has a better control on her hemoglobin A1c.  She has upcoming appointment with the new endocrinologist in July.  Patient denies any crying spells or any feeling of hopelessness or worthlessness.  Her job is busy but manageable.  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  Her 25 year old son is working and she is happy about it.  In the beginning she was disappointed because he had decided not to go to school but now patient feels that he is busy and doing well.  Patient denies any tremors, shakes or any EPS.  She denies any panic attack.  She like to keep the current medication.  Past Psychiatric History: Reviewed. H/O depression after her daughter born.  Took Prozac, hydroxyzine, increased  Wellbutrin, Effexor, Zoloft and Cymbalta. No H/O suicidal attempt or inpatient, mania and psychosis.   Psychiatric Specialty Exam: Physical Exam  Review of Systems  Weight 145 lb (65.8 kg).There is no height or weight on file to calculate BMI.  General Appearance: NA  Eye Contact:  NA  Speech:  Clear and Coherent and Normal Rate  Volume:  Normal  Mood:  Euthymic  Affect:  NA  Thought Process:  Goal Directed  Orientation:  Full (Time, Place, and Person)  Thought Content:  Logical  Suicidal Thoughts:  No  Homicidal Thoughts:  No  Memory:  Immediate;   Good Recent;   Good Remote;   Good  Judgement:  Intact  Insight:  Good  Psychomotor Activity:  NA  Concentration:  Concentration: Good and Attention Span: Good  Recall:  Good  Fund of Knowledge:  Good  Language:  Good  Akathisia:  No  Handed:  Right  AIMS (if indicated):     Assets:  Communication Skills Desire for Improvement Housing Resilience Social Support Talents/Skills Transportation  ADL's:  Intact  Cognition:  WNL  Sleep:   ok      Assessment and Plan: Major depressive disorder, recurrent.  Anxiety.  Patient is doing better on her current medication.  She had lost weight but gradually and slowly.  She is happy but hoping to have a better blood work results in July for her hemoglobin A1c.  Her family members are doing well and she  is happy.  She like to keep the current medication.  Continue trazodone 100 mg at bedtime as needed for sleep, Lamictal 150 mg twice a day and Wellbutrin XL 300 mg daily.  She has no rash, itching tremors or shakes.  She does not need a new prescription of Ativan.  I recommended to call us back if she has any question or any concern.  Follow-up in 3 months.  Follow Up Instructions:    I discussed the assessment and treatment plan with the patient. The patient was provided an opportunity to ask questions and all were answered. The patient agreed with the plan and demonstrated an  understanding of the instructions.   The patient was advised to call back or seek an in-person evaluation if the symptoms worsen or if the condition fails to improve as anticipated.  Collaboration of Care: Primary Care Provider AEB Notes are available in Epic to review.  Patient/Guardian was advised Release of Information must be obtained prior to any record release in order to collaborate their care with an outside provider. Patient/Guardian was advised if they have not already done so to contact the registration department to sign all necessary forms in order for [place] to release information regarding their care.   Consent: Patient/Guardian gives verbal consent for treatment and assignment of benefits for services provided during this visit. Patient/Guardian expressed understanding and agreed to proceed.    I provided 28 minutes of non-face-to-face time during this encounter.   Cleotis Nipper, MD

## 2021-07-17 ENCOUNTER — Other Ambulatory Visit: Payer: Medicaid Other

## 2021-07-20 ENCOUNTER — Ambulatory Visit: Payer: Medicaid Other | Admitting: Endocrinology

## 2021-07-20 ENCOUNTER — Ambulatory Visit: Payer: Medicaid Other | Admitting: Internal Medicine

## 2021-07-27 ENCOUNTER — Telehealth (HOSPITAL_COMMUNITY): Payer: Medicaid Other | Admitting: Psychiatry

## 2021-08-01 ENCOUNTER — Encounter: Payer: Self-pay | Admitting: *Deleted

## 2021-08-04 ENCOUNTER — Ambulatory Visit (INDEPENDENT_AMBULATORY_CARE_PROVIDER_SITE_OTHER): Payer: No Typology Code available for payment source | Admitting: Internal Medicine

## 2021-08-04 ENCOUNTER — Encounter: Payer: Self-pay | Admitting: Internal Medicine

## 2021-08-04 VITALS — BP 116/82 | HR 93 | Ht 63.0 in | Wt 141.0 lb

## 2021-08-04 DIAGNOSIS — E109 Type 1 diabetes mellitus without complications: Secondary | ICD-10-CM | POA: Diagnosis not present

## 2021-08-04 DIAGNOSIS — E7800 Pure hypercholesterolemia, unspecified: Secondary | ICD-10-CM | POA: Diagnosis not present

## 2021-08-04 LAB — LIPID PANEL
Cholesterol: 141 mg/dL (ref 0–200)
HDL: 55.4 mg/dL (ref >39.00)
LDL Cholesterol: 71 mg/dL (ref 0–99)
NonHDL: 85.84
Total CHOL/HDL Ratio: 3
Triglycerides: 74 mg/dL (ref 0.0–149.0)
VLDL: 14.8 mg/dL (ref 0.0–40.0)

## 2021-08-04 LAB — T4, FREE: Free T4: 1.04 ng/dL (ref 0.60–1.60)

## 2021-08-04 LAB — BASIC METABOLIC PANEL WITH GFR
BUN: 8 mg/dL (ref 6–23)
CO2: 29 meq/L (ref 19–32)
Calcium: 9.3 mg/dL (ref 8.4–10.5)
Chloride: 101 meq/L (ref 96–112)
Creatinine, Ser: 0.61 mg/dL (ref 0.40–1.20)
GFR: 114.25 mL/min (ref >60.00)
Glucose, Bld: 100 mg/dL — ABNORMAL HIGH (ref 70–99)
Potassium: 3.5 meq/L (ref 3.5–5.1)
Sodium: 139 meq/L (ref 135–145)

## 2021-08-04 LAB — MICROALBUMIN / CREATININE URINE RATIO
Creatinine,U: 183.8 mg/dL
Microalb Creat Ratio: 1.1 mg/g (ref 0.0–30.0)
Microalb, Ur: 2 mg/dL — ABNORMAL HIGH (ref 0.0–1.9)

## 2021-08-04 LAB — POCT GLYCOSYLATED HEMOGLOBIN (HGB A1C): Hemoglobin A1C: 6.2 % — AB (ref 4.0–5.6)

## 2021-08-04 LAB — TSH: TSH: 0.93 u[IU]/mL (ref 0.35–5.50)

## 2021-08-04 MED ORDER — ROSUVASTATIN CALCIUM 20 MG PO TABS
20.0000 mg | ORAL_TABLET | Freq: Every day | ORAL | 3 refills | Status: DC
Start: 2021-08-04 — End: 2022-06-18

## 2021-08-04 NOTE — Progress Notes (Signed)
Name: Sierra Schneider  Age/ Sex: 36 y.o., female   MRN/ DOB: [id number], November 26, 1985     PCP: Fayette Pho, MD   Reason for Endocrinology Evaluation: Type 1 Diabetes Mellitus  Initial Endocrine Consultative Visit: 01/02/2013    PATIENT IDENTIFIER: Sierra Schneider is a 36 y.o. female with a past medical history of T1DM and dyslipidemia . The patient has followed with Endocrinology clinic since 01/02/2013 for consultative assistance with management of her diabetes.  DIABETIC HISTORY:  Sierra Schneider was diagnosed with DM in 2011, has been on an insulin pump since ~ 2012. Her hemoglobin A1c has ranged from 6.1% in 2023, peaking at 8.5% in 2017.  Transitioned from Dr. Lucianne Muss 07/2021   SUBJECTIVE:   During the last visit (03/22/2021): saw Dr. Lucianne Muss . A1c 6.1 %   Today (08/04/2021): Sierra Schneider  is here for a follow up on diabetes management. She checks her blood sugars multiple  times daily, through CGM. The patient has had hypoglycemic episodes since the last clinic visit, which typically occur 1 x /week - most often occuring during the day . The patient is  symptomatic with these episodes  She has lost 14 lbs , has not been eating much  Denies nausea, vomiting or diarrhea   This patient with type 1 diabetes is treated with Omnipod  (insulin pump). During the visit the pump basal and bolus doses were reviewed including carb/insulin rations and supplemental doses. The clinical list was updated. The glucose meter download was reviewed in detail to determine if the current pump settings are providing the best glycemic control without excessive hypoglycemia.  Pump and meter download:    Pump   Omnipod   Settings   Insulin type   Humalog    Basal rate       0000 1.3  u/h    0700 1.4   1000 0.6   1200 1.3   1800 1.05   1900 0.9   2200 1.0  I:C ratio       0000  1:7   1200 1:9   1700 1:14          Sensitivity       0000  35       Goal       0000  70-110              Type & Model of Pump: Omnipod 5 Insulin Type: Currently using Humalog .  Body mass index is 24.98 kg/m.  PUMP STATISTICS: Average BG: 142 Average Daily Carbs (g): 166.9 Average Total Daily Insulin: 40.6 Average Daily Basal: 20 units (49%) Average Daily Bolus: 20.5 units (51%)         HOME DIABETES REGIMEN:  Humalog      Statin: yes ACE-I/ARB: no Prior Diabetic Education: yes     DIABETIC COMPLICATIONS: Microvascular complications:   Denies: neuropathy, retinopathy, CKD Last Eye Exam: Completed 2022  Macrovascular complications:   Denies: CAD, CVA, PVD   HISTORY:  Past Medical History:  Past Medical History:  Diagnosis Date   Depression    Diabetes mellitus 08/2009   late onset type 1   Past Surgical History:  Past Surgical History:  Procedure Laterality Date   CESAREAN SECTION     ESOPHAGOGASTRODUODENOSCOPY Left 10/14/2012   Procedure: ESOPHAGOGASTRODUODENOSCOPY (EGD);  Surgeon: Vertell Novak., MD;  Location: [facility] END[facility];  Service: Endoscopy;  Laterality: Left;   WISDOM TOOTH EXTRACTION     Social History:  reports that she  quit smoking about 7 years ago. Her smoking use included cigarettes. She has never used smokeless tobacco. She reports current alcohol use. She reports that she does not use drugs. Family History:  Family History  Problem Relation Age of Onset   Depression Mother    Hypertension Father    Hyperlipidemia Father    Kidney disease Paternal Grandmother    Diabetes Maternal Grandfather    ADD / ADHD Brother    Depression Brother    Drug abuse Brother    Depression Maternal Aunt    Depression Paternal Aunt    Psychosis Cousin    Heart disease Neg Hx      HOME MEDICATIONS: Allergies as of 08/04/2021   No Known Allergies      Medication List        Accurate as of August 04, 2021  2:39 PM. If you have any questions, ask your nurse or doctor.          STOP taking these medications    Cetirizine  HCl 10 MG Caps Stopped by: Scarlette Shorts, MD       TAKE these medications    Accu-Chek Guide test strip Generic drug: glucose blood PLEASE USE TO CHECK BLOOD SUGAR TWICE DAILY   buPROPion 300 MG 24 hr tablet Commonly known as: WELLBUTRIN XL Take 1 tablet (300 mg total) by mouth daily.   Dexcom G6 Sensor Misc Use to monitor blood sugar, change after 10 days   Dexcom G6 Transmitter Misc Change every 3 months   fluticasone 50 MCG/ACT nasal spray Commonly known as: FLONASE Place 1-2 sprays into both nostrils daily.   insulin lispro 100 UNIT/ML injection Commonly known as: HumaLOG USE MAX 80 UNITS DAILY VIA INSULIN PUMP. (NEED OFFICE VISIT FOR REFILLS)   lamoTRIgine 150 MG tablet Commonly known as: LAMICTAL Take 2 tablets (300 mg total) by mouth daily.   LORazepam 0.5 MG tablet Commonly known as: ATIVAN TAKE 1 TABLET BY MOUTH EVERY DAY AS NEEDED FOR ANXIETY   Omnipod 5 G6 Pod (Gen 5) Misc 1 Device by Does not apply route every 3 (three) days.   ondansetron 4 MG disintegrating tablet Commonly known as: ZOFRAN-ODT Take 1 tablet (4 mg total) by mouth every 8 (eight) hours as needed for nausea or vomiting.   rosuvastatin 10 MG tablet Commonly known as: CRESTOR 1 tablet   rosuvastatin 20 MG tablet Commonly known as: CRESTOR TAKE 1 TABLET BY MOUTH DAILY. MAKE APPOINTMENT FOR FOLLOW-UP   traZODone 100 MG tablet Commonly known as: DESYREL Take 1 tablet (100 mg total) by mouth at bedtime as needed for sleep.         OBJECTIVE:   Vital Signs: BP 116/82 (BP Location: Left Arm, Patient Position: Sitting, Cuff Size: Normal)   Pulse 93   Ht 5\' 3"  (1.6 m)   Wt 141 lb (64 kg)   SpO2 97%   BMI 24.98 kg/m   Wt Readings from Last 3 Encounters:  08/04/21 141 lb (64 kg)  03/22/21 154 lb 6.4 oz (70 kg)  09/15/20 151 lb 9.6 oz (68.8 kg)     Exam: General: Pt appears well and is in NAD  Neck: General: Supple without adenopathy. Thyroid: Thyroid size normal.   No goiter or nodules appreciated.   Lungs: Clear with good BS bilat with no rales, rhonchi, or wheezes  Heart: RRR   Abdomen: Normoactive bowel sounds, soft, nontender, without masses or organomegaly palpable  Extremities: No pretibial edema.   Neuro: MS  is good with appropriate affect, pt is alert and Ox3       DATA REVIEWED:  Lab Results  Component Value Date   HGBA1C 6.2 (A) 08/04/2021   HGBA1C 6.1 03/17/2021   HGBA1C 7.5 (H) 09/07/2020    Latest Reference Range & Units 08/04/21 14:44  Sodium 135 - 145 mEq/L 139  Potassium 3.5 - 5.1 mEq/L 3.5  Chloride 96 - 112 mEq/L 101  CO2 19 - 32 mEq/L 29  Glucose 70 - 99 mg/dL 161100 (H)  BUN 6 - 23 mg/dL 8  Creatinine 0.960.40 - 0.451.20 mg/dL 4.090.61  Calcium 8.4 - 81.110.5 mg/dL 9.3  GFR >91.47>60.00 mL/min 114.25  Total CHOL/HDL Ratio  3  Cholesterol 0 - 200 mg/dL 829141  HDL Cholesterol >56.21>39.00 mg/dL 30.8655.40  LDL (calc) 0 - 99 mg/dL 71  MICROALB/CREAT RATIO 0.0 - 30.0 mg/g 1.1  NonHDL  85.84  Triglycerides 0.0 - 149.0 mg/dL 57.874.0  VLDL 0.0 - 46.940.0 mg/dL 62.914.8    Latest Reference Range & Units 08/04/21 14:44  TSH 0.35 - 5.50 uIU/mL 0.93  T4,Free(Direct) 0.60 - 1.60 ng/dL 5.281.04    Latest Reference Range & Units 08/04/21 14:44  Creatinine,U mg/dL 413.2183.8  Microalb, Ur 0.0 - 1.9 mg/dL 2.0 (H)  MICROALB/CREAT RATIO 0.0 - 30.0 mg/g 1.1     ASSESSMENT / PLAN / RECOMMENDATIONS:   1) Type 1 Diabetes Mellitus, optimally controlled, Without complications - Most recent A1c of 6.2 %. Goal A1c < 7.0 %.    -Patient with variable hyperglycemia and hypoglycemia -Her A1c remains at the lower end -I will reduce her basal rate at midnight due to reported hypoglycemia at night -I will adjust her insulin to carb ratio as well as sensitivity factor as below as she has been noted with postprandial hyperglycemia  -BMP, TFTs, and MA/CR ratio normal  MEDICATIONS: Humalog  EDUCATION / INSTRUCTIONS: BG monitoring instructions: Patient is instructed to check her blood  sugars 4 times a day, before meals and bedtime. Call [facility] if: BG persistently < 70  I reviewed the Rule of 15 for the treatment of hypoglycemia in detail with the patient. Literature supplied.   2) Diabetic complications:  Eye: Does not have known diabetic retinopathy.  Neuro/ Feet: Does not have known diabetic peripheral neuropathy .  Renal: Patient does not have known baseline CKD. She   is not on an ACEI/ARB at present.   3) Dyslipidemia:  -Lipid panel today at goal    Medication Continue rosuvastatin 20 mg daily    F/U in 4 months   Signed electronically by: Lyndle HerrlichAbby Jaralla Mykal Kirchman, MD  [facility]  [facility] [address] [place], [place] [address] Phone: [phone] FAX: [phone]   CC: Fayette PhoLynn, Catherine, MD [address] [place] [address] Phone: [phone]  Fax: [phone]  Return to Endocrinology clinic as below: Future Appointments  Date Time Provider Department Center  10/12/2021  8:40 AM Arfeen, Phillips GroutSyed T, MD [facility] None

## 2021-08-04 NOTE — Patient Instructions (Signed)
  Pump   Omnipod   Settings   Insulin type   Humalog    Basal rate       0000 1.15 u/h    0700 1.4   1000 0.6   1200 1.3   1800 1.05   1900 0.9   2200 1.0  I:C ratio       0000  1:7   1100 1:8   1700 1:14          Sensitivity       0000  40      Goal       0000  70-110

## 2021-08-08 ENCOUNTER — Encounter: Payer: Self-pay | Admitting: Internal Medicine

## 2021-08-31 ENCOUNTER — Ambulatory Visit: Payer: Medicaid Other | Admitting: Internal Medicine

## 2021-09-02 ENCOUNTER — Other Ambulatory Visit: Payer: Self-pay | Admitting: Endocrinology

## 2021-09-02 DIAGNOSIS — E1065 Type 1 diabetes mellitus with hyperglycemia: Secondary | ICD-10-CM

## 2021-09-19 ENCOUNTER — Encounter: Payer: Self-pay | Admitting: Internal Medicine

## 2021-09-20 ENCOUNTER — Other Ambulatory Visit: Payer: Self-pay

## 2021-09-20 DIAGNOSIS — E1065 Type 1 diabetes mellitus with hyperglycemia: Secondary | ICD-10-CM

## 2021-09-20 MED ORDER — DEXCOM G6 SENSOR MISC: 3 refills | Status: DC

## 2021-09-20 MED ORDER — OMNIPOD 5 DEXG7G6 PODS GEN 5 MISC: 1.0000 | 3 refills | Status: DC

## 2021-09-20 MED ORDER — DEXCOM G6 TRANSMITTER MISC: 3 refills | Status: DC

## 2021-10-12 ENCOUNTER — Other Ambulatory Visit (HOSPITAL_COMMUNITY): Payer: Self-pay | Admitting: Psychiatry

## 2021-10-12 ENCOUNTER — Encounter (HOSPITAL_COMMUNITY): Payer: Self-pay | Admitting: Psychiatry

## 2021-10-12 ENCOUNTER — Telehealth (HOSPITAL_BASED_OUTPATIENT_CLINIC_OR_DEPARTMENT_OTHER): Payer: No Typology Code available for payment source | Admitting: Psychiatry

## 2021-10-12 DIAGNOSIS — F331 Major depressive disorder, recurrent, moderate: Secondary | ICD-10-CM | POA: Diagnosis not present

## 2021-10-12 DIAGNOSIS — F419 Anxiety disorder, unspecified: Secondary | ICD-10-CM

## 2021-10-12 MED ORDER — BUPROPION HCL ER (XL) 300 MG PO TB24: 300.0000 mg | ORAL_TABLET | Freq: Every day | ORAL | 0 refills | Status: DC

## 2021-10-12 MED ORDER — LAMOTRIGINE 150 MG PO TABS: 300.0000 mg | ORAL_TABLET | Freq: Every day | ORAL | 0 refills | Status: DC

## 2021-10-12 MED ORDER — TRAZODONE HCL 100 MG PO TABS: 100.0000 mg | ORAL_TABLET | Freq: Every evening | ORAL | 0 refills | Status: DC | PRN

## 2021-10-12 NOTE — Progress Notes (Signed)
Virtual Visit via Telephone Note  I connected with Sierra Schneider on 10/12/21 at  8:40 AM EDT by telephone and verified that I am speaking with the correct person using two identifiers.  Location: Patient: Work Provider: Office   I discussed the limitations, risks, security and privacy concerns of performing an evaluation and management service by telephone and the availability of in person appointments. I also discussed with the patient that there may be a patient responsible charge related to this service. The patient expressed understanding and agreed to proceed.   History of Present Illness: Patient is evaluated by phone session.  She is doing well on her current medication.  She had a beach trip with the family and really enjoyed the time around people.  Her mother is doing well and she took early retirement and now she is hoping to get a job in [employer] so she has more flexible hours and is still keep the nephew.  Patient is pleased that her brother is doing well and son is also working and there is no issues with them.  Patient lately more busy at work and trying to find help.  She is sleeping good.  Her appetite is okay.  She lost few pounds since the last visit and she feels good about it.  Her blood sugar is also stable.  She denies any crying spells or any feeling of hopelessness or worthlessness.  She is taking trazodone 2-3 times a week.  She has not taken Ativan in a while.  She like to keep the Wellbutrin and Lamictal.  She denies any feeling of hopelessness or worthlessness.  She has no tremors, rash, itching or shakes.  Past Psychiatric History: Reviewed. H/O depression after her daughter born.  Took Prozac, hydroxyzine, increased Wellbutrin, Effexor, Zoloft and Cymbalta. No H/O suicidal attempt or inpatient, mania and psychosis.   Recent Results (from the past 2160 hour(s))  POCT HgB A1C     Status: Abnormal   Collection Time: 08/04/21  2:27 PM  Result Value Ref Range    Hemoglobin A1C 6.2 (A) 4.0 - 5.6 %   HbA1c POC (<> result, manual entry)     HbA1c, POC (prediabetic range)     HbA1c, POC (controlled diabetic range)    TSH     Status: None   Collection Time: 08/04/21  2:44 PM  Result Value Ref Range   TSH 0.93 0.35 - 5.50 uIU/mL  T4, free     Status: None   Collection Time: 08/04/21  2:44 PM  Result Value Ref Range   Free T4 1.04 0.60 - 1.60 ng/dL    Comment: Specimens from patients who are undergoing biotin therapy and /or ingesting biotin supplements may contain high levels of biotin.  The higher biotin concentration in these specimens interferes with this Free T4 assay.  Specimens that contain high levels  of biotin may cause false high results for this Free T4 assay.  Please interpret results in light of the total clinical presentation of the patient.    Microalbumin / creatinine urine ratio     Status: Abnormal   Collection Time: 08/04/21  2:44 PM  Result Value Ref Range   Microalb, Ur 2.0 (H) 0.0 - 1.9 mg/dL   Creatinine,U 867.6 mg/dL   Microalb Creat Ratio 1.1 0.0 - 30.0 mg/g  Lipid panel     Status: None   Collection Time: 08/04/21  2:44 PM  Result Value Ref Range   Cholesterol 141 0 - 200  mg/dL    Comment: ATP III Classification       Desirable:  < 200 mg/dL               Borderline High:  200 - 239 mg/dL          High:  > = 573 mg/dL   Triglycerides 22.0 0.0 - 149.0 mg/dL    Comment: Normal:  <254 mg/dLBorderline High:  150 - 199 mg/dL   HDL 27.06 >23.76 mg/dL   VLDL 28.3 0.0 - 15.1 mg/dL   LDL Cholesterol 71 0 - 99 mg/dL   Total CHOL/HDL Ratio 3     Comment:                Men          Women1/2 Average Risk     3.4          3.3Average Risk          5.0          4.42X Average Risk          9.6          7.13X Average Risk          15.0          11.0                       NonHDL 85.84     Comment: NOTE:  Non-HDL goal should be 30 mg/dL higher than patient's LDL goal (i.e. LDL goal of < 70 mg/dL, would have non-HDL goal of < 100 mg/dL)   Basic metabolic panel     Status: Abnormal   Collection Time: 08/04/21  2:44 PM  Result Value Ref Range   Sodium 139 135 - 145 mEq/L   Potassium 3.5 3.5 - 5.1 mEq/L   Chloride 101 96 - 112 mEq/L   CO2 29 19 - 32 mEq/L   Glucose, Bld 100 (H) 70 - 99 mg/dL   BUN 8 6 - 23 mg/dL   Creatinine, Ser 7.61 0.40 - 1.20 mg/dL   GFR 607.37 >10.62 mL/min    Comment: Calculated using the CKD-EPI Creatinine Equation (2021)   Calcium 9.3 8.4 - 10.5 mg/dL     Psychiatric Specialty Exam: Physical Exam  Review of Systems  Weight 135 lb (61.2 kg).There is no height or weight on file to calculate BMI.  General Appearance: NA  Eye Contact:  NA  Speech:  Clear and Coherent and Normal Rate  Volume:  Normal  Mood:  Euthymic  Affect:  NA  Thought Process:  Goal Directed  Orientation:  Full (Time, Place, and Person)  Thought Content:  WDL  Suicidal Thoughts:  No  Homicidal Thoughts:  No  Memory:  Immediate;   Good Recent;   Good Remote;   Good  Judgement:  Good  Insight:  Present  Psychomotor Activity:  NA  Concentration:  Concentration: Good and Attention Span: Good  Recall:  Good  Fund of Knowledge:  Good  Language:  Good  Akathisia:  No  Handed:  Right  AIMS (if indicated):     Assets:  Communication Skills Desire for Improvement Housing Resilience Social Support Talents/Skills Transportation  ADL's:  Intact  Cognition:  WNL  Sleep:   ok      Assessment and Plan: Major depressive disorder, recurrent.  Anxiety.  Patient is stable and doing well on her current medication.  I reviewed blood work results.  Her hemoglobin A1c  is stable.  She is taking trazodone only as needed.  Continue Wellbutrin XL 300 mg daily, Lamictal 150 mg twice a day and trazodone 100 mg as needed for insomnia.  She has not taken Ativan in a while.  Recommended to call us back if she has any question or any concern.  Follow-up in 3 months.  Follow Up Instructions:    I discussed the assessment and  treatment plan with the patient. The patient was provided an opportunity to ask questions and all were answered. The patient agreed with the plan and demonstrated an understanding of the instructions.   The patient was advised to call back or seek an in-person evaluation if the symptoms worsen or if the condition fails to improve as anticipated.  Collaboration of Care: Primary Care Provider AEB notes are available in epic to review.  Patient/Guardian was advised Release of Information must be obtained prior to any record release in order to collaborate their care with an outside provider. Patient/Guardian was advised if they have not already done so to contact the registration department to sign all necessary forms in order for [place] to release information regarding their care.   Consent: Patient/Guardian gives verbal consent for treatment and assignment of benefits for services provided during this visit. Patient/Guardian expressed understanding and agreed to proceed.    I provided 14 minutes of non-face-to-face time during this encounter.   Cleotis Nipper, MD

## 2021-10-20 IMAGING — CR DG LUMBAR SPINE COMPLETE 4+V
5 series · 5 of 5 positions shown · non-contrast
Comparison: None

CLINICAL DATA: Low back pain on RIGHT side for 3 weeks, no known
injury

EXAM:
LUMBAR SPINE - COMPLETE 4+ VIEW

[t lumbar spine ap]
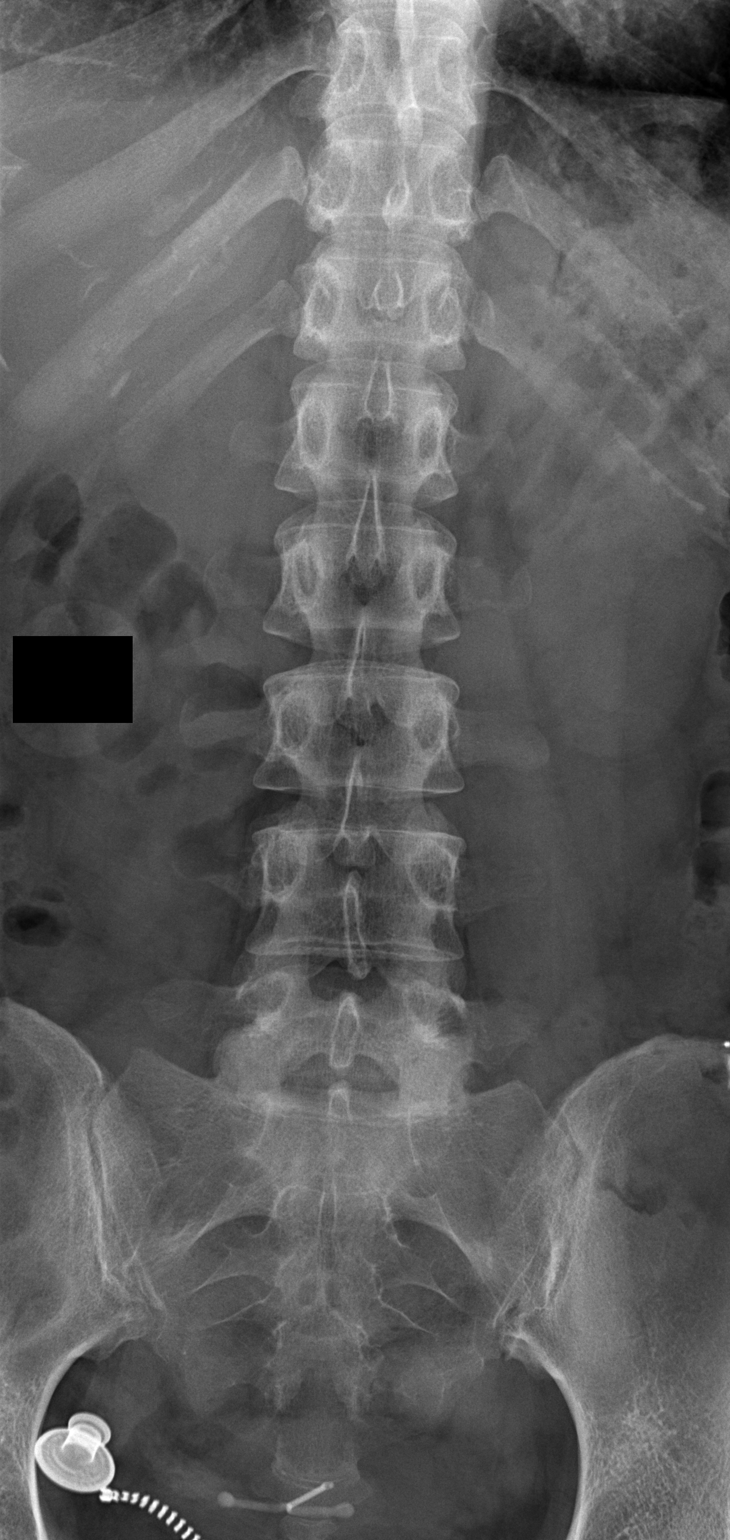

[t lumbar spine obl (1 of 2)]
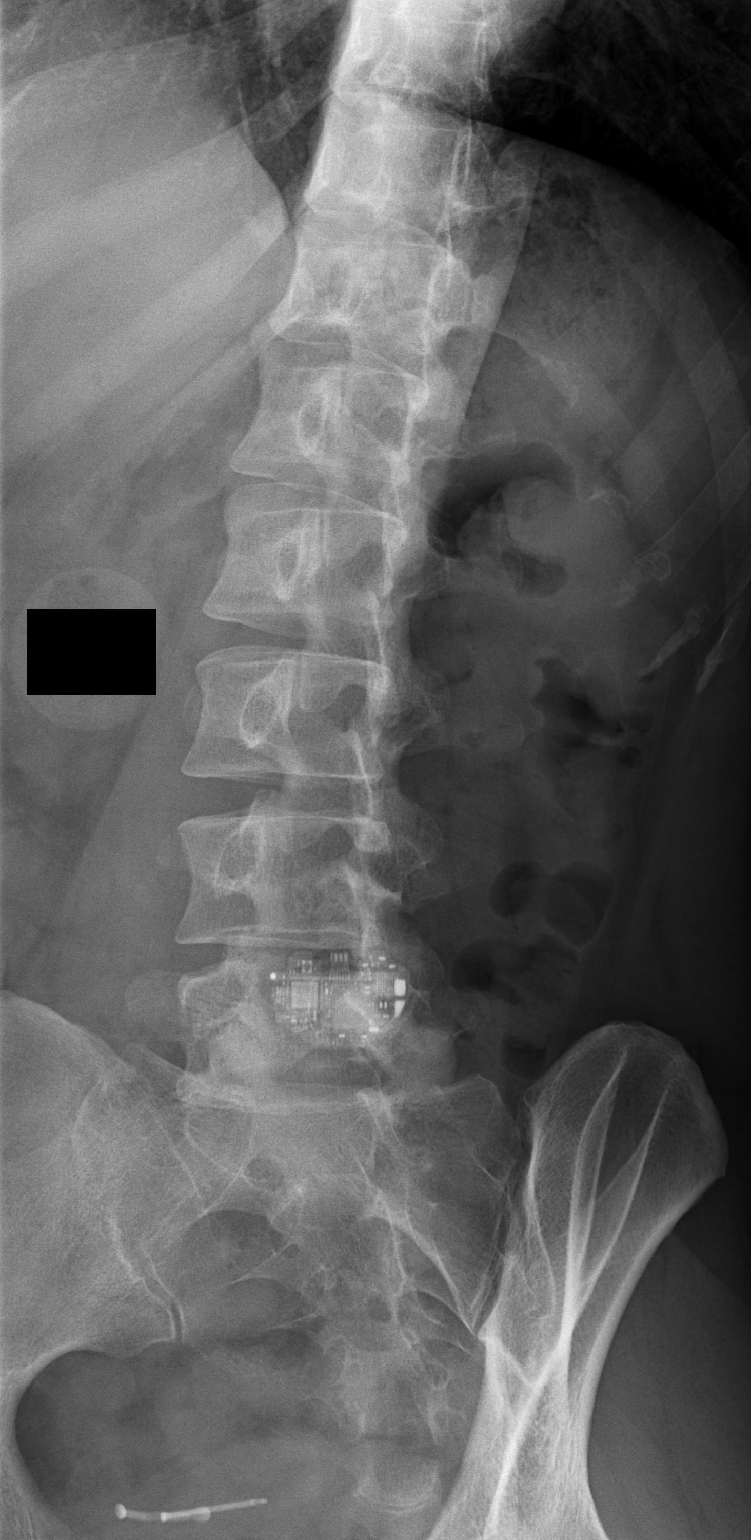

[t lumbar spine obl (2 of 2)]
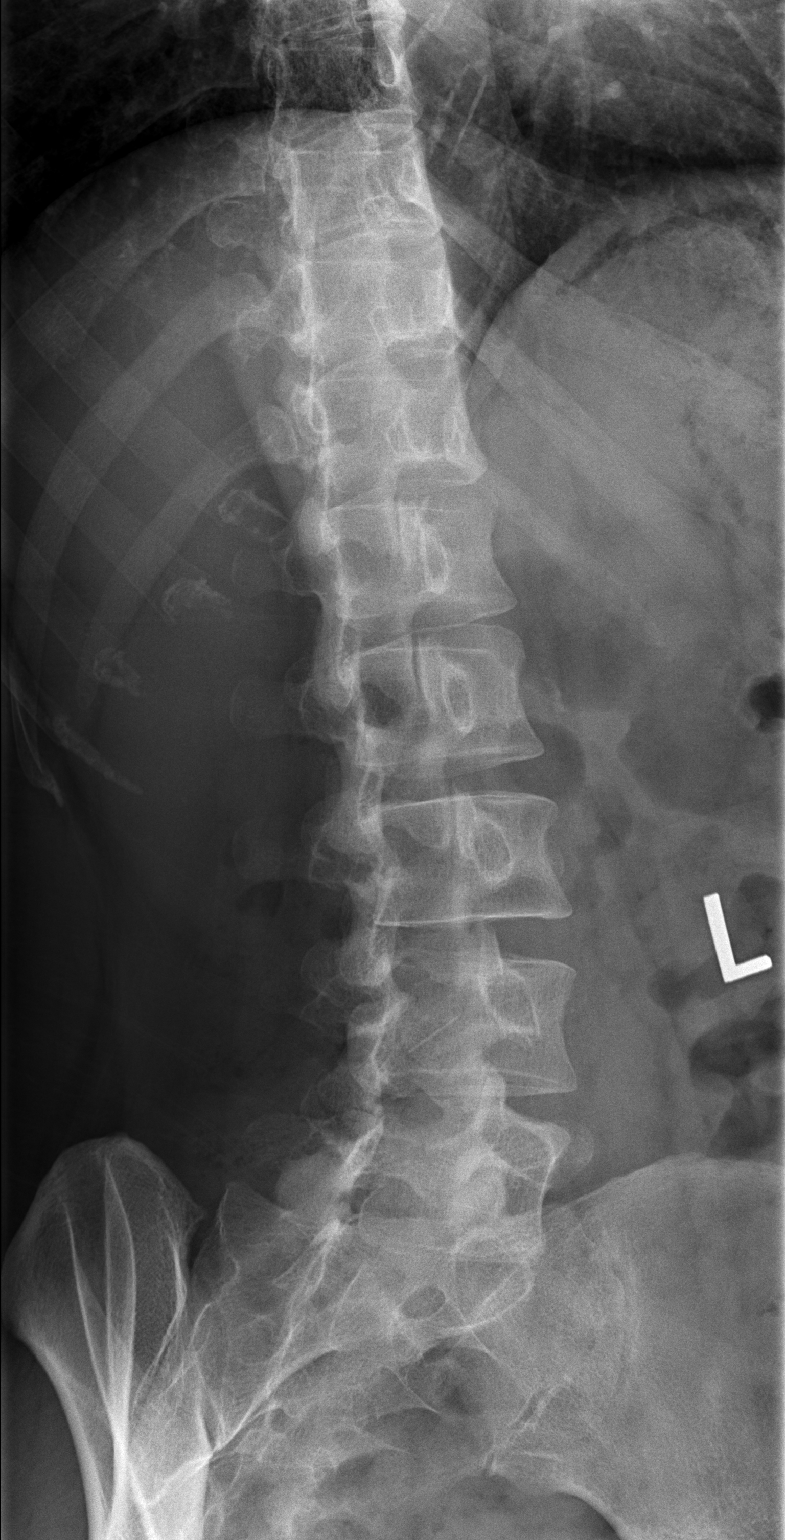

[t lumbar spine lat]
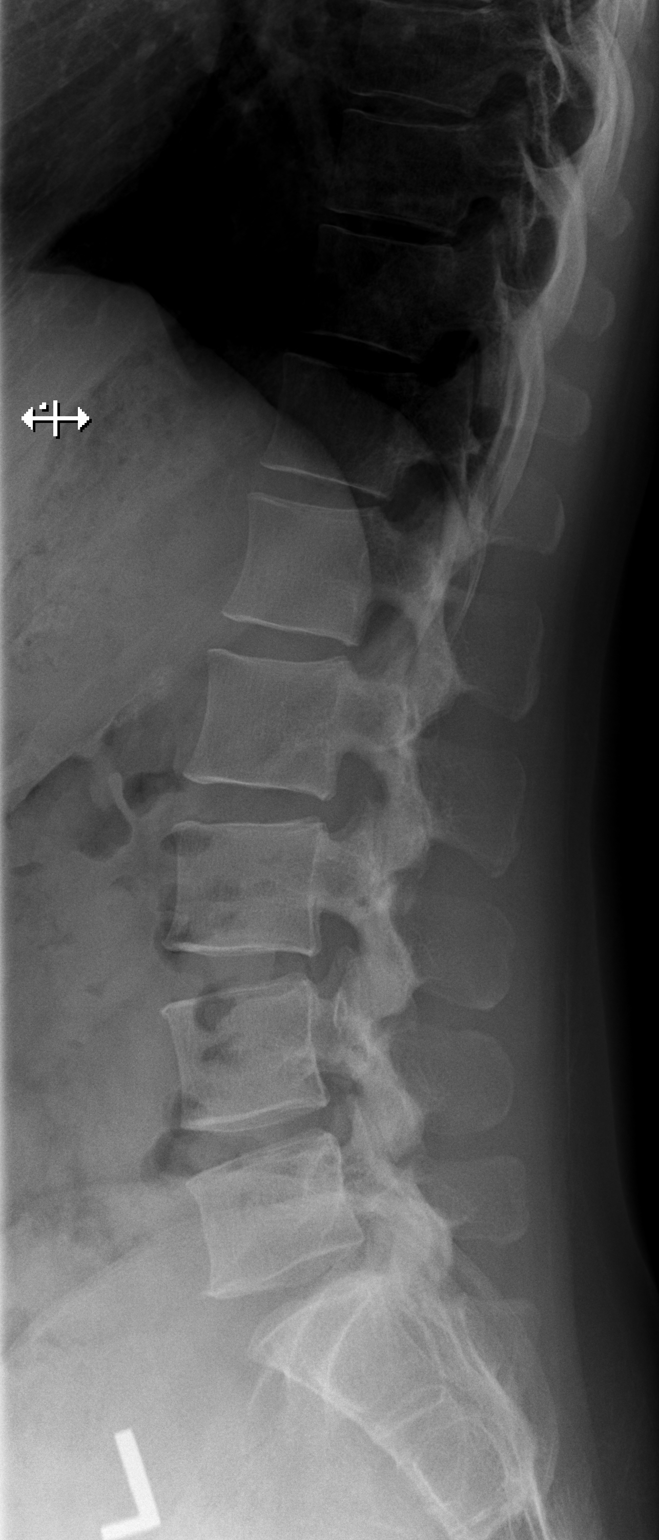

[t lumbar l-5 s-1 spot]
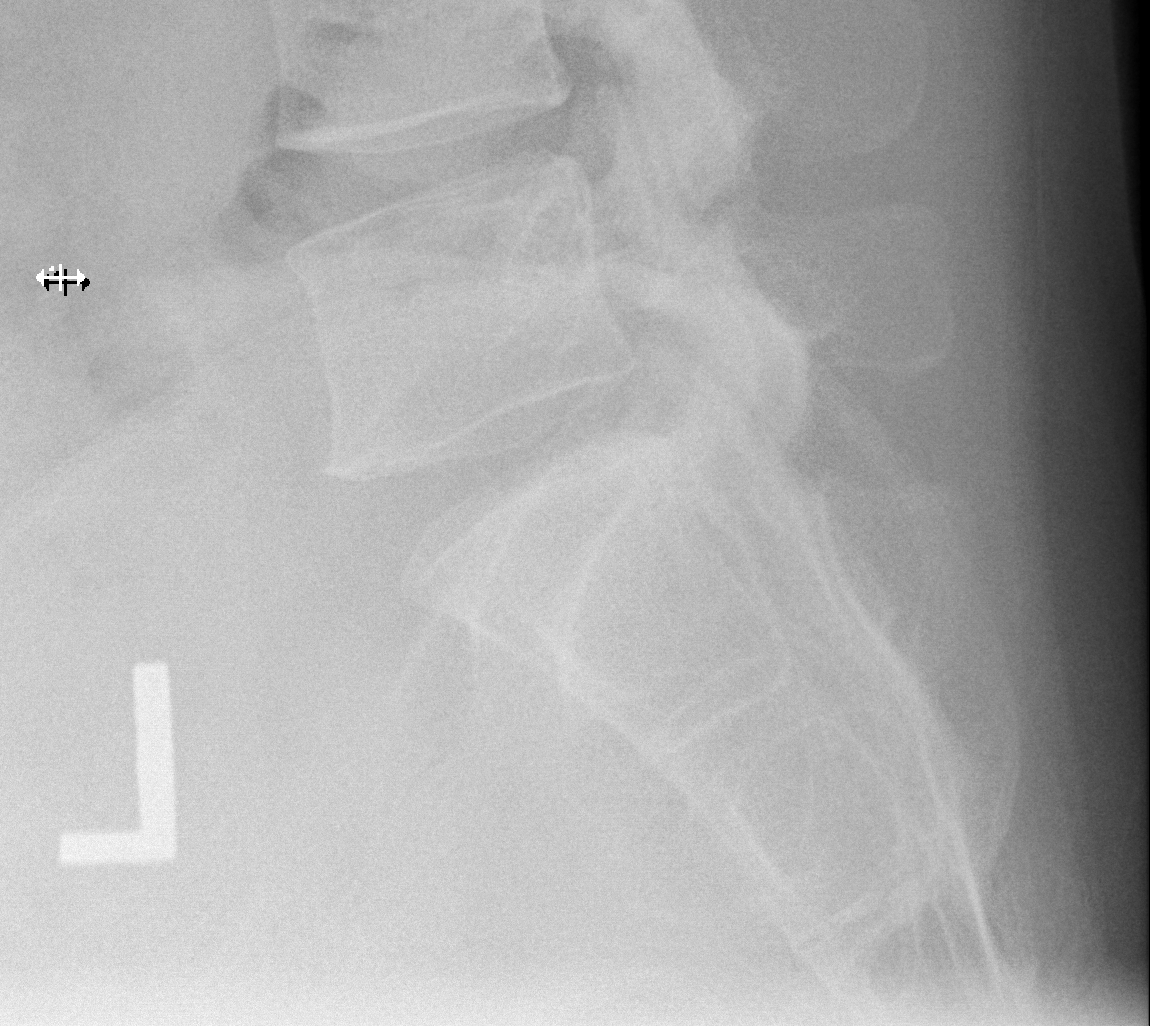

[5 of 5 positions shown; findings below may reference images not displayed]

FINDINGS: 5 non-rib-bearing lumbar vertebra.

Vertebral body and disc space heights maintained.

No fracture, subluxation, or bone destruction.

No spondylolysis.

SI joints preserved.
IMPRESSION: No acute abnormalities.

## 2021-10-22 ENCOUNTER — Other Ambulatory Visit: Payer: Self-pay | Admitting: Endocrinology

## 2021-10-22 DIAGNOSIS — E1065 Type 1 diabetes mellitus with hyperglycemia: Secondary | ICD-10-CM

## 2021-10-28 ENCOUNTER — Other Ambulatory Visit: Payer: Self-pay | Admitting: Internal Medicine

## 2021-10-28 DIAGNOSIS — E1065 Type 1 diabetes mellitus with hyperglycemia: Secondary | ICD-10-CM

## 2021-10-31 ENCOUNTER — Other Ambulatory Visit (HOSPITAL_COMMUNITY): Payer: Self-pay | Admitting: Psychiatry

## 2021-10-31 DIAGNOSIS — F331 Major depressive disorder, recurrent, moderate: Secondary | ICD-10-CM

## 2021-11-21 ENCOUNTER — Telehealth (HOSPITAL_COMMUNITY): Payer: Self-pay | Admitting: *Deleted

## 2021-11-21 NOTE — Telephone Encounter (Signed)
Writer spoke with pt who called to ask if you would take her 37 y.o. son as a pt. Seems he has ongoing depression and also issues with anxiety. Pt had an appointment with Dr. De Nurse on 11/14/20 however was a No Show. However there is a script for zoloft 75 mg total qd prescribed by Dr. De Nurse on 08/21/21. Pt has no future appts with [facility].

## 2021-11-21 NOTE — Telephone Encounter (Signed)
Please check with Dr De Nurse if he ever saw this patient.

## 2021-11-21 NOTE — Telephone Encounter (Signed)
error 

## 2021-11-22 NOTE — Telephone Encounter (Signed)
Dr. De Nurse said that pt needs new provider.

## 2021-11-22 NOTE — Telephone Encounter (Signed)
Can you ask Dr De Nurse if he will take him back? Thanks

## 2021-11-22 NOTE — Telephone Encounter (Signed)
Her son saw Dr. De Nurse three times in early in 2022. No showed after that and no further appointments made. Unclear as to why.

## 2021-12-12 NOTE — Progress Notes (Unsigned)
Name: Sierra Schneider  Age/ Sex: 36 y.o., female   MRN/ DOB: [id number], 1985/11/11     PCP: Ezequiel Essex, MD   Reason for Endocrinology Evaluation: Type 1 Diabetes Mellitus  Initial Endocrine Consultative Visit: 01/02/2013    PATIENT IDENTIFIER: [name] is a 36 y.o. female with a past medical history of T1DM and dyslipidemia . The patient has followed with Endocrinology clinic since 01/02/2013 for consultative assistance with management of her diabetes.  DIABETIC HISTORY:  Sierra Schneider was diagnosed with DM in 2011, has been on an insulin pump since ~ 2012. Her hemoglobin A1c has ranged from 6.1% in 2023, peaking at 8.5% in 2017.  Transitioned from Dr. Dwyane Dee 07/2021   SUBJECTIVE:   During the last visit (08/04/2021):  A1c 6.2 %   Today (12/13/2021): Sierra Schneider  is here for a follow up on diabetes management. Sierra Schneider checks her blood sugars multiple  times daily, through CGM. The patient has had hypoglycemic episodes since the last clinic visit, which typically occur 1 x /week - most often occuring during the day . The patient is  symptomatic with these episodes  Sierra Schneider has lost 14 lbs , has not been eating much  Denies nausea, vomiting or diarrhea   This patient with type 1 diabetes is treated with Omnipod  (insulin pump). During the visit the pump basal and bolus doses were reviewed including carb/insulin rations and supplemental doses. The clinical list was updated. The glucose meter download was reviewed in detail to determine if the current pump settings are providing the best glycemic control without excessive hypoglycemia.  Pump and meter download:    Pump   Omnipod   Settings   Insulin type   Humalog    Basal rate       0000 1.15  u/h    0700 1.4   1000 0.6   1200 1.3   1800 0.9   1900 1.0      I:C ratio       0000  1:7   1200 1:8   1700 1:14          Sensitivity       0000  40      Goal       0000  110 -130            Type & Model of Pump:  Omnipod 5 Insulin Type: Currently using Humalog .  Body mass index is 23.91 kg/m.  PUMP STATISTICS: Average BG: 154 Average Daily Carbs (g): 131.7 Average Total Daily Insulin: 35.2 Average Daily Basal: 19.6 units (56%) Average Daily Bolus: 15.6 units (44%)      HOME DIABETES REGIMEN:  Humalog      Statin: yes ACE-I/ARB: no Prior Diabetic Education: yes     DIABETIC COMPLICATIONS: Microvascular complications:   Denies: neuropathy, retinopathy, CKD Last Eye Exam: Completed 2022  Macrovascular complications:   Denies: CAD, CVA, PVD   HISTORY:  Past Medical History:  Past Medical History:  Diagnosis Date   Depression    Diabetes mellitus 08/2009   late onset type 1   Past Surgical History:  Past Surgical History:  Procedure Laterality Date   CESAREAN SECTION     ESOPHAGOGASTRODUODENOSCOPY Left 10/14/2012   Procedure: ESOPHAGOGASTRODUODENOSCOPY (EGD);  Surgeon: Winfield Cunas., MD;  Location: [facility] ENDOSCOPY;  Service: Endoscopy;  Laterality: Left;   WISDOM TOOTH EXTRACTION     Social History:  reports that Sierra Schneider quit smoking about 7 years ago. Her  smoking use included cigarettes. Sierra Schneider has never used smokeless tobacco. Sierra Schneider reports current alcohol use. Sierra Schneider reports that Sierra Schneider does not use drugs. Family History:  Family History  Problem Relation Age of Onset   Depression Mother    Hypertension Father    Hyperlipidemia Father    Kidney disease Paternal Grandmother    Diabetes Maternal Grandfather    ADD / ADHD Brother    Depression Brother    Drug abuse Brother    Depression Maternal Aunt    Depression Paternal Aunt    Psychosis Cousin    Heart disease Neg Hx      HOME MEDICATIONS: Allergies as of 12/13/2021   No Known Allergies      Medication List        Accurate as of December 13, 2021  2:25 PM. If you have any questions, ask your nurse or doctor.          Accu-Chek Guide test strip Generic drug: glucose blood PLEASE USE TO CHECK BLOOD  SUGAR TWICE DAILY   buPROPion 300 MG 24 hr tablet Commonly known as: WELLBUTRIN XL Take 1 tablet (300 mg total) by mouth daily.   Dexcom G6 Sensor Misc Use to monitor blood sugar, change after 10 days   Dexcom G6 Transmitter Misc Change every 3 months   fluticasone 50 MCG/ACT nasal spray Commonly known as: FLONASE Place 1-2 sprays into both nostrils daily.   insulin lispro 100 UNIT/ML injection Commonly known as: HumaLOG USE MAX 80 UNITS DAILY VIA INSULIN PUMP. (NEED OFFICE VISIT FOR REFILLS)   lamoTRIgine 150 MG tablet Commonly known as: LAMICTAL Take 2 tablets (300 mg total) by mouth daily.   LORazepam 0.5 MG tablet Commonly known as: ATIVAN TAKE 1 TABLET BY MOUTH EVERY DAY AS NEEDED FOR ANXIETY   Omnipod 5 G6 Pod (Gen 5) Misc 1 Device by Does not apply route every 3 (three) days.   ondansetron 4 MG disintegrating tablet Commonly known as: ZOFRAN-ODT Take 1 tablet (4 mg total) by mouth every 8 (eight) hours as needed for nausea or vomiting.   rosuvastatin 20 MG tablet Commonly known as: CRESTOR Take 1 tablet (20 mg total) by mouth daily.   traZODone 100 MG tablet Commonly known as: DESYREL Take 1 tablet (100 mg total) by mouth at bedtime as needed for sleep.         OBJECTIVE:   Vital Signs: BP 122/80 (BP Location: Left Arm, Patient Position: Sitting, Cuff Size: Small)   Pulse 98   Ht 5\' 3"  (1.6 m)   Wt 135 lb (61.2 kg)   SpO2 99%   BMI 23.91 kg/m   Wt Readings from Last 3 Encounters:  12/13/21 135 lb (61.2 kg)  08/04/21 141 lb (64 kg)  03/22/21 154 lb 6.4 oz (70 kg)     Exam: General: Pt appears well and is in NAD  Neck: General: Supple without adenopathy. Thyroid: Thyroid size normal.  No goiter or nodules appreciated.   Lungs: Clear with good BS bilat with no rales, rhonchi, or wheezes  Heart: RRR   Abdomen: Normoactive bowel sounds, soft, nontender, without masses or organomegaly palpable  Extremities: No pretibial edema.   Neuro: MS is  good with appropriate affect, pt is alert and Ox3    DM Foot Exam 12/13/2021  The skin of the feet is intact without sores or ulcerations. The pedal pulses are 2+ on right and 2+ on left. The sensation is intact to a screening 5.07, 10 gram monofilament bilaterally  DATA REVIEWED:  Lab Results  Component Value Date   HGBA1C 6.4 (A) 12/13/2021   HGBA1C 6.2 (A) 08/04/2021   HGBA1C 6.1 03/17/2021    Latest Reference Range & Units 08/04/21 14:44  Sodium 135 - 145 mEq/L 139  Potassium 3.5 - 5.1 mEq/L 3.5  Chloride 96 - 112 mEq/L 101  CO2 19 - 32 mEq/L 29  Glucose 70 - 99 mg/dL 100 (H)  BUN 6 - 23 mg/dL 8  Creatinine 0.40 - 1.20 mg/dL 0.61  Calcium 8.4 - 10.5 mg/dL 9.3  GFR >60.00 mL/min 114.25  Total CHOL/HDL Ratio  3  Cholesterol 0 - 200 mg/dL 141  HDL Cholesterol >39.00 mg/dL 55.40  LDL (calc) 0 - 99 mg/dL 71  MICROALB/CREAT RATIO 0.0 - 30.0 mg/g 1.1  NonHDL  85.84  Triglycerides 0.0 - 149.0 mg/dL 74.0  VLDL 0.0 - 40.0 mg/dL 14.8    Latest Reference Range & Units 08/04/21 14:44  TSH 0.35 - 5.50 uIU/mL 0.93  T4,Free(Direct) 0.60 - 1.60 ng/dL 1.04    Latest Reference Range & Units 08/04/21 14:44  Creatinine,U mg/dL 183.8  Microalb, Ur 0.0 - 1.9 mg/dL 2.0 (H)  MICROALB/CREAT RATIO 0.0 - 30.0 mg/g 1.1     ASSESSMENT / PLAN / RECOMMENDATIONS:   1) Type 1 Diabetes Mellitus, optimally controlled, Without complications - Most recent A1c of 6.4%. Goal A1c < 7.0 %.    -A1c remain at goal  - Sierra Schneider has been noted with hypoglycemia during the day after a correction bolus, will adjust correction factor  - IN the mean time shew was encouraged to continue with enter CHO intake before the meal rather than after the meal  -BMP, TFTs, and MA/CR ratio normal in June, 2023  MEDICATIONS: Humalog    Pump   Omnipod   Settings   Insulin type   Humalog    Basal rate       0000 1.15  u/h    0700 1.4   1000 0.6   1200 1.3   1800 0.9   1900 1.0      I:C ratio       0000  1:7    1200 1:8   1700 1:14          Sensitivity       0000  45      Goal       0000  110           EDUCATION / INSTRUCTIONS: BG monitoring instructions: Patient is instructed to check her blood sugars 4 times a day, before meals and bedtime. Call [facility] if: BG persistently < 70  I reviewed the Rule of 15 for the treatment of hypoglycemia in detail with the patient. Literature supplied.   2) Diabetic complications:  Eye: Does not have known diabetic retinopathy.  Neuro/ Feet: Does not have known diabetic peripheral neuropathy .  Renal: Patient does not have known baseline CKD. Sierra Schneider   is not on an ACEI/ARB at present.   3) Dyslipidemia:  -Lipid panel  at goal    Medication Continue rosuvastatin 20 mg daily  4) Athlete's Foot :   - Sierra Schneider has chronic hyperhydrosis of feet , discussed using OTC athlete foot spray  even when rash has resolved  - Pt to use 100% cotton socks   F/U in 6 months   F/U in 4 months   Signed electronically by: Mack Guise, MD  [facility]  [facility]  Dolores Patty [address], [place] [address] Phone: [phone] FAX: [phone]   CC: Ezequiel Essex, MD [place] [address] Phone: [phone]  Fax: [phone]  Return to Endocrinology clinic as below: Future Appointments  Date Time Provider [place]  01/11/2022  8:40 AM Arfeen, Arlyce Harman, MD [facility] None

## 2021-12-13 ENCOUNTER — Ambulatory Visit (INDEPENDENT_AMBULATORY_CARE_PROVIDER_SITE_OTHER): Payer: No Typology Code available for payment source | Admitting: Internal Medicine

## 2021-12-13 ENCOUNTER — Encounter: Payer: Self-pay | Admitting: Internal Medicine

## 2021-12-13 VITALS — BP 122/80 | HR 98 | Ht 63.0 in | Wt 135.0 lb

## 2021-12-13 DIAGNOSIS — E1065 Type 1 diabetes mellitus with hyperglycemia: Secondary | ICD-10-CM | POA: Diagnosis not present

## 2021-12-13 DIAGNOSIS — E109 Type 1 diabetes mellitus without complications: Secondary | ICD-10-CM | POA: Diagnosis not present

## 2021-12-13 DIAGNOSIS — B353 Tinea pedis: Secondary | ICD-10-CM | POA: Diagnosis not present

## 2021-12-13 LAB — POCT GLYCOSYLATED HEMOGLOBIN (HGB A1C): Hemoglobin A1C: 6.4 % — AB (ref 4.0–5.6)

## 2021-12-13 MED ORDER — INSULIN LISPRO 100 UNIT/ML IJ SOLN: INTRAMUSCULAR | 3 refills | Status: DC

## 2021-12-13 NOTE — Patient Instructions (Signed)

## 2021-12-14 ENCOUNTER — Encounter: Payer: Self-pay | Admitting: Internal Medicine

## 2021-12-16 ENCOUNTER — Encounter: Payer: Self-pay | Admitting: Internal Medicine

## 2021-12-23 ENCOUNTER — Other Ambulatory Visit (HOSPITAL_COMMUNITY): Payer: Self-pay | Admitting: Psychiatry

## 2021-12-23 DIAGNOSIS — F419 Anxiety disorder, unspecified: Secondary | ICD-10-CM

## 2022-01-11 ENCOUNTER — Encounter (HOSPITAL_COMMUNITY): Payer: Self-pay | Admitting: Psychiatry

## 2022-01-11 ENCOUNTER — Telehealth (HOSPITAL_BASED_OUTPATIENT_CLINIC_OR_DEPARTMENT_OTHER): Payer: No Typology Code available for payment source | Admitting: Psychiatry

## 2022-01-11 DIAGNOSIS — F419 Anxiety disorder, unspecified: Secondary | ICD-10-CM | POA: Diagnosis not present

## 2022-01-11 DIAGNOSIS — F331 Major depressive disorder, recurrent, moderate: Secondary | ICD-10-CM | POA: Diagnosis not present

## 2022-01-11 MED ORDER — LAMOTRIGINE 150 MG PO TABS: 300.0000 mg | ORAL_TABLET | Freq: Every day | ORAL | 0 refills | Status: DC

## 2022-01-11 MED ORDER — BUPROPION HCL ER (XL) 300 MG PO TB24: 300.0000 mg | ORAL_TABLET | Freq: Every day | ORAL | 0 refills | Status: DC

## 2022-01-11 MED ORDER — TRAZODONE HCL 100 MG PO TABS: 100.0000 mg | ORAL_TABLET | Freq: Every evening | ORAL | 0 refills | Status: DC | PRN

## 2022-01-11 NOTE — Progress Notes (Signed)
Virtual Visit via Telephone Note  I connected with Sierra Schneider on 01/11/22 at  8:40 AM EST by telephone and verified that I am speaking with the correct person using two identifiers.  Location: Patient: Work Provider: Biomedical scientist   I discussed the limitations, risks, security and privacy concerns of performing an evaluation and management service by telephone and the availability of in person appointments. I also discussed with the patient that there may be a patient responsible charge related to this service. The patient expressed understanding and agreed to proceed.   History of Present Illness: Patient is evaluated by phone session.  She is doing well and is stable on her medication.  She has not taken Ativan in almost a year.  She sleeps good and takes 2-3 times trazodone.  She is pleased that her son finally got into see the doctor and taking medication.  Patient recently had a visit with the endocrinologist and her hemoglobin A1c stable.  She is also pleased her mother who now took early retirement working part-time in a school [employer].  Her job is flexible.  She feels family members are doing well and there is no more stress that causes a lot of anxiety or depression.  She feels hopeful and denies any suicidal thoughts.  She is taking Lamictal, Wellbutrin every day.  Her appetite is okay.  She has no rash, itching tremors or shakes.  Past Psychiatric History: Reviewed. H/O depression after her daughter born.  Took Prozac, hydroxyzine, increased Wellbutrin, Effexor, Zoloft and Cymbalta. No H/O suicidal attempt or inpatient, mania and psychosis.   Recent Results (from the past 2160 hour(s))  POCT glycosylated hemoglobin (Hb A1C)     Status: Abnormal   Collection Time: 12/13/21  2:03 PM  Result Value Ref Range   Hemoglobin A1C 6.4 (A) 4.0 - 5.6 %   HbA1c POC (<> result, manual entry)     HbA1c, POC (prediabetic range)     HbA1c, POC (controlled diabetic range)       Psychiatric  Specialty Exam: Physical Exam  Review of Systems  Weight 135 lb (61.2 kg).There is no height or weight on file to calculate BMI.  General Appearance: NA  Eye Contact:  NA  Speech:  Clear and Coherent and Normal Rate  Volume:  Normal  Mood:  Euthymic  Affect:  NA  Thought Process:  Goal Directed  Orientation:  Full (Time, Place, and Person)  Thought Content:  WDL  Suicidal Thoughts:  No  Homicidal Thoughts:  No  Memory:  Immediate;   Good Recent;   Good Remote;   Good  Judgement:  Good  Insight:  Present  Psychomotor Activity:  NA  Concentration:  Concentration: Good and Attention Span: Good  Recall:  Good  Fund of Knowledge:  Good  Language:  Good  Akathisia:  No  Handed:  Right  AIMS (if indicated):     Assets:  Communication Skills Desire for Improvement Housing Resilience Social Support Talents/Skills Transportation  ADL's:  Intact  Cognition:  WNL  Sleep:   ok      Assessment and Plan: Major depressive disorder, recurrent.  Anxiety.  I reviewed blood work results.  Her hemoglobin A1c is 6.4 which is stable.  Patient does not want to change the medication since it is working.  Continue Wellbutrin XL 300 mg daily, Lamictal 150 mg twice a day and trazodone 100 mg as needed for insomnia.  Recommended to call us back if she has any question, concern  or if she feels worsening of the symptoms.  Follow-up in 3 months.  Follow Up Instructions:    I discussed the assessment and treatment plan with the patient. The patient was provided an opportunity to ask questions and all were answered. The patient agreed with the plan and demonstrated an understanding of the instructions.   The patient was advised to call back or seek an in-person evaluation if the symptoms worsen or if the condition fails to improve as anticipated.  Collaboration of Care: Other provider involved in patient's care AEB notes are available in epic to review.  Patient/Guardian was advised Release of  Information must be obtained prior to any record release in order to collaborate their care with an outside provider. Patient/Guardian was advised if they have not already done so to contact the registration department to sign all necessary forms in order for [place] to release information regarding their care.   Consent: Patient/Guardian gives verbal consent for treatment and assignment of benefits for services provided during this visit. Patient/Guardian expressed understanding and agreed to proceed.    I provided 20 minutes of non-face-to-face time during this encounter.   Cleotis Nipper, MD

## 2022-01-14 ENCOUNTER — Other Ambulatory Visit (HOSPITAL_COMMUNITY): Payer: Self-pay | Admitting: Psychiatry

## 2022-01-14 DIAGNOSIS — F331 Major depressive disorder, recurrent, moderate: Secondary | ICD-10-CM

## 2022-02-04 ENCOUNTER — Other Ambulatory Visit (HOSPITAL_COMMUNITY): Payer: Self-pay | Admitting: Psychiatry

## 2022-02-04 DIAGNOSIS — F419 Anxiety disorder, unspecified: Secondary | ICD-10-CM

## 2022-02-08 ENCOUNTER — Other Ambulatory Visit (HOSPITAL_COMMUNITY): Payer: Self-pay | Admitting: Psychiatry

## 2022-02-08 DIAGNOSIS — F419 Anxiety disorder, unspecified: Secondary | ICD-10-CM

## 2022-03-01 ENCOUNTER — Other Ambulatory Visit (HOSPITAL_COMMUNITY): Payer: Self-pay | Admitting: Psychiatry

## 2022-03-01 DIAGNOSIS — F419 Anxiety disorder, unspecified: Secondary | ICD-10-CM

## 2022-03-26 ENCOUNTER — Other Ambulatory Visit (HOSPITAL_COMMUNITY): Payer: Self-pay | Admitting: Psychiatry

## 2022-03-26 DIAGNOSIS — F419 Anxiety disorder, unspecified: Secondary | ICD-10-CM

## 2022-04-12 ENCOUNTER — Encounter (HOSPITAL_COMMUNITY): Payer: Self-pay | Admitting: Psychiatry

## 2022-04-12 ENCOUNTER — Telehealth (HOSPITAL_COMMUNITY): Payer: No Typology Code available for payment source | Admitting: Psychiatry

## 2022-04-12 VITALS — Wt 129.0 lb

## 2022-04-12 DIAGNOSIS — F331 Major depressive disorder, recurrent, moderate: Secondary | ICD-10-CM

## 2022-04-12 DIAGNOSIS — F419 Anxiety disorder, unspecified: Secondary | ICD-10-CM | POA: Diagnosis not present

## 2022-04-12 MED ORDER — LAMOTRIGINE 150 MG PO TABS: 300.0000 mg | ORAL_TABLET | Freq: Every day | ORAL | 0 refills | Status: DC

## 2022-04-12 MED ORDER — TRAZODONE HCL 100 MG PO TABS: 100.0000 mg | ORAL_TABLET | Freq: Every evening | ORAL | 0 refills | Status: DC | PRN

## 2022-04-12 MED ORDER — BUPROPION HCL ER (XL) 300 MG PO TB24: 300.0000 mg | ORAL_TABLET | Freq: Every day | ORAL | 0 refills | Status: DC

## 2022-04-12 NOTE — Progress Notes (Signed)
[facility] MD Virtual Progress Note   Patient Location: Work Provider Location: Office  I connect with patient by telephone and verified that I am speaking with correct person by using two identifiers. I discussed the limitations of evaluation and management by telemedicine and the availability of in person appointments. I also discussed with the patient that there may be a patient responsible charge related to this service. The patient expressed understanding and agreed to proceed.  Sierra Schneider [id number] 37 y.o.  04/12/2022 1:38 PM    History of Present Illness:  Patient is evaluated by phone session.  She is at work.  She doing very well and she reported her mood are stable and denies any panic attack or any crying spells.  She reported job is very stressful busy but manageable.  She has not taken Ativan for more than 6 months.  She reported her holidays were quite but good.  She is pleased that her her son is doing well and keeping his blood sugar under control.  Patient also keeping her blood sugar under control.  She lost few pounds because not snacking or overeating.  Patient told her daughter playing volleyball and sometimes she has to travel with her.  She denies any agitation, anger, crying spells.  She admitted keeping her distance from her brother who sometimes struggle in his life.  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  She does not want to change the medication since she reported symptoms are manageable.    Past Psychiatric History: H/O depression after her daughter born.  Took Prozac, hydroxyzine, increased Wellbutrin, Effexor, Zoloft and Cymbalta. No H/O suicidal attempt or inpatient, mania and psychosis.     Outpatient Encounter Medications as of 04/12/2022  Medication Sig   ACCU-CHEK GUIDE test strip PLEASE USE TO CHECK BLOOD SUGAR TWICE DAILY   buPROPion (WELLBUTRIN XL)  300 MG 24 hr tablet Take 1 tablet (300 mg total) by mouth daily.   Continuous Blood Gluc Sensor (DEXCOM G6 SENSOR) MISC Use to monitor blood sugar, change after 10 days   Continuous Blood Gluc Transmit (DEXCOM G6 TRANSMITTER) MISC Change every 3 months   fluticasone (FLONASE) 50 MCG/ACT nasal spray Place 1-2 sprays into both nostrils daily.   Insulin Disposable Pump (OMNIPOD 5 G6 POD, GEN 5,) MISC 1 Device by Does not apply route every 3 (three) days.   insulin lispro (HUMALOG) 100 UNIT/ML injection USE MAX 80 UNITS DAILY VIA INSULIN PUMP. (NEED OFFICE VISIT FOR REFILLS)   lamoTRIgine (LAMICTAL) 150 MG tablet Take 2 tablets (300 mg total) by mouth daily.   LORazepam (ATIVAN) 0.5 MG tablet TAKE 1 TABLET BY MOUTH EVERY DAY AS NEEDED FOR ANXIETY (Patient not taking: Reported on 01/11/2022)   ondansetron (ZOFRAN-ODT) 4 MG disintegrating tablet Take 1 tablet (4 mg total) by mouth every 8 (eight) hours as needed for nausea or vomiting. (Patient not taking: Reported on 01/11/2022)   rosuvastatin (CRESTOR) 20 MG tablet Take 1 tablet (20 mg total) by mouth daily.   traZODone (DESYREL) 100 MG tablet TAKE 1 TABLET BY MOUTH AT BEDTIME AS NEEDED FOR SLEEP.   No facility-administered encounter medications on file as of 04/12/2022.    No results found for this or any previous visit (from the past 2160 hour(s)).   Psychiatric Specialty Exam: Physical Exam  Review of Systems  Weight 129 lb (58.5 kg).There is no height or weight on file to calculate  BMI.  General Appearance: NA  Eye Contact:  NA  Speech:  Clear and Coherent and Normal Rate  Volume:  Normal  Mood:  Euthymic  Affect:  NA  Thought Process:  Goal Directed  Orientation:  Full (Time, Place, and Person)  Thought Content:  WDL  Suicidal Thoughts:  No  Homicidal Thoughts:  No  Memory:  Immediate;   Good Recent;   Good Remote;   Good  Judgement:  Good  Insight:  Good  Psychomotor Activity:  NA  Concentration:  Concentration: Good and  Attention Span: Good  Recall:  Good  Fund of Knowledge:  Good  Language:  Good  Akathisia:  No  Handed:  Right  AIMS (if indicated):     Assets:  Communication Skills Desire for Improvement Housing Social Support Talents/Skills Transportation  ADL's:  Intact  Cognition:  WNL  Sleep:  ok     Assessment/Plan: Anxiety  Moderate episode of recurrent major depressive disorder (HCC)  Patient is stable on her current medication.  She has now side effects, concern or issues.  Continue Wellbutrin XL 300 mg daily, Lamictal 150 mg 2 times a day and trazodone 100 mg at as needed for insomnia.  She has no rash, itching tremors or shakes.  Recommended to call us back if is any question or any concern.  Follow-up in 3 months.   Follow Up Instructions:     I discussed the assessment and treatment plan with the patient. The patient was provided an opportunity to ask questions and all were answered. The patient agreed with the plan and demonstrated an understanding of the instructions.   The patient was advised to call back or seek an in-person evaluation if the symptoms worsen or if the condition fails to improve as anticipated.    Collaboration of Care: Other provider involved in patient's care AEB notes are available in epic to review.  Patient/Guardian was advised Release of Information must be obtained prior to any record release in order to collaborate their care with an outside provider. Patient/Guardian was advised if they have not already done so to contact the registration department to sign all necessary forms in order for [place] to release information regarding their care.   Consent: Patient/Guardian gives verbal consent for treatment and assignment of benefits for services provided during this visit. Patient/Guardian expressed understanding and agreed to proceed.     I provided 13 minutes of non face to face time during this encounter.  Kathlee Nations, MD 04/12/2022

## 2022-05-07 ENCOUNTER — Other Ambulatory Visit (HOSPITAL_COMMUNITY): Payer: Self-pay | Admitting: Psychiatry

## 2022-05-07 DIAGNOSIS — F419 Anxiety disorder, unspecified: Secondary | ICD-10-CM

## 2022-05-27 ENCOUNTER — Other Ambulatory Visit (HOSPITAL_COMMUNITY): Payer: Self-pay | Admitting: Psychiatry

## 2022-05-27 DIAGNOSIS — F419 Anxiety disorder, unspecified: Secondary | ICD-10-CM

## 2022-06-18 ENCOUNTER — Encounter: Payer: Self-pay | Admitting: Internal Medicine

## 2022-06-18 ENCOUNTER — Ambulatory Visit (INDEPENDENT_AMBULATORY_CARE_PROVIDER_SITE_OTHER): Payer: No Typology Code available for payment source | Admitting: Internal Medicine

## 2022-06-18 VITALS — BP 118/72 | HR 68 | Ht 63.0 in | Wt 143.0 lb

## 2022-06-18 DIAGNOSIS — E109 Type 1 diabetes mellitus without complications: Secondary | ICD-10-CM | POA: Diagnosis not present

## 2022-06-18 DIAGNOSIS — E1065 Type 1 diabetes mellitus with hyperglycemia: Secondary | ICD-10-CM

## 2022-06-18 DIAGNOSIS — E7800 Pure hypercholesterolemia, unspecified: Secondary | ICD-10-CM | POA: Diagnosis not present

## 2022-06-18 LAB — POCT GLYCOSYLATED HEMOGLOBIN (HGB A1C): Hemoglobin A1C: 6.6 % — AB (ref 4.0–5.6)

## 2022-06-18 MED ORDER — INSULIN LISPRO 100 UNIT/ML IJ SOLN: INTRAMUSCULAR | 3 refills | Status: DC

## 2022-06-18 MED ORDER — ROSUVASTATIN CALCIUM 20 MG PO TABS: 20.0000 mg | ORAL_TABLET | Freq: Every day | ORAL | 3 refills | Status: DC

## 2022-06-18 NOTE — Progress Notes (Signed)
Name: Sierra Schneider  Age/ Sex: 37 y.o., female   MRN/ DOB: [id number], 04-11-1985     PCP: Fayette Pho, MD   Reason for Endocrinology Evaluation: Type 1 Diabetes Mellitus  Initial Endocrine Consultative Visit: 01/02/2013    PATIENT IDENTIFIER: Sierra Schneider is a 37 y.o. female with a past medical history of T1DM and dyslipidemia . The patient has followed with Endocrinology clinic since 01/02/2013 for consultative assistance with management of her diabetes.  DIABETIC HISTORY:  [name] was diagnosed with DM in 2011, has been on an insulin pump since ~ 2012. Her hemoglobin A1c has ranged from 6.1% in 2023, peaking at 8.5% in 2017.  Transitioned from Dr. Lucianne Muss 07/2021   SUBJECTIVE:   During the last visit (12/13/2021):  A1c 6.4 %   Today (06/18/2022): [name]  is here for a follow up on diabetes management. She checks her blood sugars multiple  times daily, through CGM. The patient has had hypoglycemic episodes since the last clinic visit, which typically occur 1 x /week - most often occuring during the day . The patient is  symptomatic with these episodes  She follows with behavioral health for anxiety Denies nausea, vomiting or diarrhea   This patient with type 1 diabetes is treated with Omnipod  (insulin pump). During the visit the pump basal and bolus doses were reviewed including carb/insulin rations and supplemental doses. The clinical list was updated. The glucose meter download was reviewed in detail to determine if the current pump settings are providing the best glycemic control without excessive hypoglycemia.  Pump and meter download:    Pump   Omnipod   Settings   Insulin type   Humalog    Basal rate       0000 1.15  u/h    0700 1.4   1000 0.6   1200 1.3   1800 0.9   1900 1.0      I:C ratio       0000  1:6   1200 1:8   1700 1:14          Sensitivity       0000  45      Goal       0000  120             Type & Model of Pump:  Omnipod 5 Insulin Type: Currently using Humalog .  Body mass index is 25.33 kg/m.  PUMP STATISTICS: Average BG: 168 Average Daily Carbs (g): 110.6 Average Total Daily Insulin: 42.9 Average Daily Basal: 29 units (68%) Average Daily Bolus: 13.9 units (32%)   CONTINUOUS GLUCOSE MONITORING RECORD INTERPRETATION    Dates of Recording: 4/9-4/22/2024  Sensor description:dexcom  Results statistics:   CGM use % of time 93.9  Average and SD 168/69  Time in range   63     %  % Time Above 180 22  % Time above 250 14  % Time Below target 1   Glycemic patterns summary: BG's optimal at night and increase during the day   Hyperglycemic episodes  postprandial   Hypoglycemic episodes occurred rare , during the day after a bolus   Overnight periods: optimal     HOME DIABETES REGIMEN:  Humalog  Rosuvastatin 20 mg daily    Statin: yes ACE-I/ARB: no Prior Diabetic Education: yes     DIABETIC COMPLICATIONS: Microvascular complications:   Denies: neuropathy, retinopathy, CKD Last Eye Exam: Completed 2022  Macrovascular complications:   Denies: CAD, CVA,  PVD   HISTORY:  Past Medical History:  Past Medical History:  Diagnosis Date   Depression    Diabetes mellitus 08/2009   late onset type 1   Past Surgical History:  Past Surgical History:  Procedure Laterality Date   CESAREAN SECTION     ESOPHAGOGASTRODUODENOSCOPY Left 10/14/2012   Procedure: ESOPHAGOGASTRODUODENOSCOPY (EGD);  Surgeon: Vertell Novak., MD;  Location[facility] END[facility]COPY;  Service: Endoscopy;  Laterality: Left;   WISDOM TOOTH EXTRACTION     Social History:  reports that she quit smoking about 8 years ago. Her smoking use included cigarettes. She has never used smokeless tobacco. She reports current alcohol use. She reports that she does not use drugs. Family History:  Family History  Problem Relation Age of Onset   Depression Mother    Hypertension Father    Hyperlipidemia Father    Kidney  disease Paternal Grandmother    Diabetes Maternal Grandfather    ADD / ADHD Brother    Depression Brother    Drug abuse Brother    Depression Maternal Aunt    Depression Paternal Aunt    Psychosis Cousin    Heart disease Neg Hx      HOME MEDICATIONS: Allergies as of 06/18/2022   No Known Allergies      Medication List        Accurate as of June 18, 2022  3:14 PM. If you have any questions, ask your nurse or doctor.          Accu-Chek Guide test strip Generic drug: glucose blood PLEASE USE TO CHECK BLOOD SUGAR TWICE DAILY   buPROPion 300 MG 24 hr tablet Commonly known as: WELLBUTRIN XL Take 1 tablet (300 mg total) by mouth daily.   Dexcom G6 Sensor Misc Use to monitor blood sugar, change after 10 days   Dexcom G6 Transmitter Misc Change every 3 months   fluticasone 50 MCG/ACT nasal spray Commonly known as: FLONASE Place 1-2 sprays into both nostrils daily.   insulin lispro 100 UNIT/ML injection Commonly known as: HumaLOG USE MAX 80 UNITS DAILY VIA INSULIN PUMP. (NEED OFFICE VISIT FOR REFILLS)   lamoTRIgine 150 MG tablet Commonly known as: LAMICTAL Take 2 tablets (300 mg total) by mouth daily.   LORazepam 0.5 MG tablet Commonly known as: ATIVAN TAKE 1 TABLET BY MOUTH EVERY DAY AS NEEDED FOR ANXIETY   Omnipod 5 G6 Pods (Gen 5) Misc 1 Device by Does not apply route every 3 (three) days.   ondansetron 4 MG disintegrating tablet Commonly known as: ZOFRAN-ODT Take 1 tablet (4 mg total) by mouth every 8 (eight) hours as needed for nausea or vomiting.   rosuvastatin 20 MG tablet Commonly known as: CRESTOR Take 1 tablet (20 mg total) by mouth daily.   traZODone 100 MG tablet Commonly known as: DESYREL Take 1 tablet (100 mg total) by mouth at bedtime as needed. for sleep         OBJECTIVE:   Vital Signs: BP 118/72 (BP Location: Left Arm, Patient Position: Sitting, Cuff Size: Small)   Pulse 68   Ht  (1.6 m)   Wt 143 lb (64.9 kg)   SpO2 98%    BMI 25.33 kg/m   Wt Readings from Last 3 Encounters:  06/18/22 143 lb (64.9 kg)  12/13/21 135 lb (61.2 kg)  08/04/21 141 lb (64 kg)     Exam: General: Pt appears well and is in NAD  Lungs: Clear with good BS bilat   Heart: RRR  Abdomen: soft, nontender  Extremities: No pretibial edema.   Neuro: MS is good with appropriate affect, pt is alert and Ox3    DM Foot Exam 12/13/2021  The skin of the feet is intact without sores or ulcerations. The pedal pulses are 2+ on right and 2+ on left. The sensation is intact to a screening 5.07, 10 gram monofilament bilaterally   DATA REVIEWED:  Lab Results  Component Value Date   HGBA1C 6.6 (A) 06/18/2022   HGBA1C 6.4 (A) 12/13/2021   HGBA1C 6.2 (A) 08/04/2021    Latest Reference Range & Units 08/04/21 14:44  Sodium 135 - 145 mEq/L 139  Potassium 3.5 - 5.1 mEq/L 3.5  Chloride 96 - 112 mEq/L 101  CO2 19 - 32 mEq/L 29  Glucose 70 - 99 mg/dL 161 (H)  BUN 6 - 23 mg/dL 8  Creatinine 0.96 - 0.45 mg/dL 4.09  Calcium 8.4 - 81.1 mg/dL 9.3  GFR >91.47 mL/min 114.25  Total CHOL/HDL Ratio  3  Cholesterol 0 - 200 mg/dL 829  HDL Cholesterol >56.21 mg/dL 30.86  LDL (calc) 0 - 99 mg/dL 71  MICROALB/CREAT RATIO 0.0 - 30.0 mg/g 1.1  NonHDL  85.84  Triglycerides 0.0 - 149.0 mg/dL 57.8  VLDL 0.0 - 46.9 mg/dL 62.9    Latest Reference Range & Units 08/04/21 14:44  TSH 0.35 - 5.50 uIU/mL 0.93  T4,Free(Direct) 0.60 - 1.60 ng/dL 5.28    Latest Reference Range & Units 08/04/21 14:44  Creatinine,U mg/dL 413.2  Microalb, Ur 0.0 - 1.9 mg/dL 2.0 (H)  MICROALB/CREAT RATIO 0.0 - 30.0 mg/g 1.1     ASSESSMENT / PLAN / RECOMMENDATIONS:   1) Type 1 Diabetes Mellitus, optimally controlled, Without complications - Most recent A1c of 6.4%. Goal A1c < 7.0 %.    -A1c remain at goal  -The patient has been noted with postprandial hyperglycemia, it also appears that the hyperglycemia takes longer to correct itself to the IQ technology, requiring the  patient to enter false carbohydrates -I will adjust her insulin to carb ratio as well as sensitivity factor as below -BMP, TFTs, and MA/CR ratio normal in June, 2023  MEDICATIONS: Humalog    Pump   Omnipod   Settings   Insulin type   Humalog    Basal rate       0000 1.15  u/h    0700 1.4   1000 0.6   1200 1.3   1800 0.9   1900 1.0      I:C ratio       0000  1:6   1200 1:8   1700 1:12          Sensitivity       0000  40      Goal       0000  110           EDUCATION / INSTRUCTIONS: BG monitoring instructions: Patient is instructed to check her blood sugars 4 times a day, before meals and bedtime. Call [facility] if: BG persistently < 70  I reviewed the Rule of 15 for the treatment of hypoglycemia in detail with the patient. Literature supplied.   2) Diabetic complications:  Eye: Does not have known diabetic retinopathy.  Neuro/ Feet: Does not have known diabetic peripheral neuropathy .  Renal: Patient does not have known baseline CKD. She   is not on an ACEI/ARB at present.   3) Dyslipidemia:  -Lipid panel  at goal    Medication Continue rosuvastatin  20 mg daily    F/U in 6 months      Signed electronically by: Lyndle Herrlich, MD  [facility] Endocrinology  [facility] [address] [place] [place], [place] [address] Phone: [phone] FAX: [phone]   CC: Fayette Pho, MD [address] [place] [address] Phone: [phone]  Fax: [phone]  Return to Endocrinology clinic as below: Future Appointments  Date Time Provider Department Center  07/12/2022  3:40 PM Arfeen, Phillips Grout, MD [facility] None

## 2022-06-18 NOTE — Patient Instructions (Signed)

## 2022-06-19 ENCOUNTER — Other Ambulatory Visit (HOSPITAL_COMMUNITY): Payer: Self-pay | Admitting: Psychiatry

## 2022-06-19 DIAGNOSIS — F419 Anxiety disorder, unspecified: Secondary | ICD-10-CM

## 2022-06-29 ENCOUNTER — Other Ambulatory Visit (HOSPITAL_COMMUNITY): Payer: Self-pay | Admitting: Psychiatry

## 2022-06-29 ENCOUNTER — Other Ambulatory Visit: Payer: Self-pay

## 2022-06-29 DIAGNOSIS — E1065 Type 1 diabetes mellitus with hyperglycemia: Secondary | ICD-10-CM

## 2022-06-29 DIAGNOSIS — F419 Anxiety disorder, unspecified: Secondary | ICD-10-CM

## 2022-06-29 MED ORDER — DEXCOM G6 SENSOR MISC
3 refills | Status: DC
Start: 2022-06-29 — End: 2022-12-25

## 2022-07-12 ENCOUNTER — Telehealth (HOSPITAL_COMMUNITY): Payer: No Typology Code available for payment source | Admitting: Psychiatry

## 2022-07-12 ENCOUNTER — Encounter (HOSPITAL_COMMUNITY): Payer: Self-pay | Admitting: Psychiatry

## 2022-07-12 VITALS — Wt 143.0 lb

## 2022-07-12 DIAGNOSIS — F419 Anxiety disorder, unspecified: Secondary | ICD-10-CM | POA: Diagnosis not present

## 2022-07-12 DIAGNOSIS — F331 Major depressive disorder, recurrent, moderate: Secondary | ICD-10-CM | POA: Diagnosis not present

## 2022-07-12 MED ORDER — TRAZODONE HCL 100 MG PO TABS: 100.0000 mg | ORAL_TABLET | Freq: Every evening | ORAL | 0 refills | Status: DC | PRN

## 2022-07-12 MED ORDER — BUPROPION HCL ER (XL) 300 MG PO TB24: 300.0000 mg | ORAL_TABLET | Freq: Every day | ORAL | 0 refills | Status: DC

## 2022-07-12 MED ORDER — LAMOTRIGINE 150 MG PO TABS: 300.0000 mg | ORAL_TABLET | Freq: Every day | ORAL | 0 refills | Status: DC

## 2022-07-12 NOTE — Progress Notes (Signed)
[facility] MD Virtual Progress Note   Patient Location: Work Provider Location: Office  I connect with patient by telephone and verified that I am speaking with correct person by using two identifiers. I discussed the limitations of evaluation and management by telemedicine and the availability of in person appointments. I also discussed with the patient that there may be a patient responsible charge related to this service. The patient expressed understanding and agreed to proceed.  Sierra Schneider [id number] 37 y.o.  07/12/2022 3:49 PM  History of Present Illness:  Patient is evaluated by phone session.  She has been doing okay on her current medication.  She has some stress related to work but manageable.  She is out of trazodone for past few days and have struggled sleeping.  She takes melatonin as needed but does not sleep all night.  She liked the trazodone which helps.  She denies any crying spells, feeling of hopelessness or worthlessness.  She is happy that her son's blood sugar is manageable and daughter recommended him to have a follow-up in 6 months.  Patient reported her brother is the same and there has been no recent issue from him.  Patient denies any agitation, anger, mania, psychosis or any suicidal thoughts.  Her appetite is okay.  Her weight is stable.  She has no tremor or shakes or any EPS and does not want to change the medication.  Past Psychiatric History: H/O depression after her daughter born.  Took Prozac, hydroxyzine, increased Wellbutrin, Effexor, Zoloft and Cymbalta. No H/O suicidal attempt or inpatient, mania and psychosis.     Outpatient Encounter Medications as of 07/12/2022  Medication Sig   ACCU-CHEK GUIDE test strip PLEASE USE TO CHECK BLOOD SUGAR TWICE DAILY   buPROPion (WELLBUTRIN XL) 300 MG 24 hr tablet Take 1 tablet (300 mg total) by mouth daily.   Continuous Blood Gluc Transmit (DEXCOM G6 TRANSMITTER) MISC Change every 3 months    Continuous Glucose Sensor (DEXCOM G6 SENSOR) MISC Use to monitor blood sugar, change after 7 days per patient request   fluticasone (FLONASE) 50 MCG/ACT nasal spray Place 1-2 sprays into both nostrils daily.   Insulin Disposable Pump (OMNIPOD 5 G6 POD, GEN 5,) MISC 1 Device by Does not apply route every 3 (three) days.   insulin lispro (HUMALOG) 100 UNIT/ML injection Max daily 60 units   lamoTRIgine (LAMICTAL) 150 MG tablet Take 2 tablets (300 mg total) by mouth daily.   LORazepam (ATIVAN) 0.5 MG tablet TAKE 1 TABLET BY MOUTH EVERY DAY AS NEEDED FOR ANXIETY   ondansetron (ZOFRAN-ODT) 4 MG disintegrating tablet Take 1 tablet (4 mg total) by mouth every 8 (eight) hours as needed for nausea or vomiting.   rosuvastatin (CRESTOR) 20 MG tablet Take 1 tablet (20 mg total) by mouth daily.   traZODone (DESYREL) 100 MG tablet Take 1 tablet (100 mg total) by mouth at bedtime as needed. for sleep   No facility-administered encounter medications on file as of 07/12/2022.    Recent Results (from the past 2160 hour(s))  POCT glycosylated hemoglobin (Hb A1C)     Status: Abnormal   Collection Time: 06/18/22  3:00 PM  Result Value Ref Range   Hemoglobin A1C 6.6 (A) 4.0 - 5.6 %   HbA1c POC (<> result, manual entry)     HbA1c, POC (prediabetic range)     HbA1c, POC (controlled diabetic range)       Psychiatric Specialty Exam: Physical Exam  Review of Systems  Weight  143 lb (64.9 kg).There is no height or weight on file to calculate BMI.  General Appearance: NA  Eye Contact:  NA  Speech:  Clear and Coherent and Normal Rate  Volume:  Normal  Mood:  Euthymic  Affect:  NA  Thought Process:  Goal Directed  Orientation:  Full (Time, Place, and Person)  Thought Content:  Logical  Suicidal Thoughts:  No  Homicidal Thoughts:  No  Memory:  Immediate;   Good Recent;   Good Remote;   Good  Judgement:  Good  Insight:  Good  Psychomotor Activity:  Normal  Concentration:  Concentration: Good and Attention  Span: Good  Recall:  Good  Fund of Knowledge:  Good  Language:  Good  Akathisia:  No  Handed:  Right  AIMS (if indicated):     Assets:  Communication Skills Desire for Improvement Housing Resilience Social Support Talents/Skills Transportation  ADL's:  Intact  Cognition:  WNL  Sleep:  fair     Assessment/Plan: Moderate episode of recurrent major depressive disorder (HCC) - Plan: buPROPion (WELLBUTRIN XL) 300 MG 24 hr tablet, lamoTRIgine (LAMICTAL) 150 MG tablet  Anxiety - Plan: lamoTRIgine (LAMICTAL) 150 MG tablet, traZODone (DESYREL) 100 MG tablet  Patient is stable on current medication.  She denies any side effects.  Continue Wellbutrin XL 300 mg daily, Lamictal 150 mg 2 times a day and trazodone 1 mg at bedtime.  Recommend to call us back if she has any question or any concern.  Follow-up in 3 months.   Follow Up Instructions:     I discussed the assessment and treatment plan with the patient. The patient was provided an opportunity to ask questions and all were answered. The patient agreed with the plan and demonstrated an understanding of the instructions.   The patient was advised to call back or seek an in-person evaluation if the symptoms worsen or if the condition fails to improve as anticipated.    Collaboration of Care: Other provider involved in patient's care AEB notes are available in epic to review.  Patient/Guardian was advised Release of Information must be obtained prior to any record release in order to collaborate their care with an outside provider. Patient/Guardian was advised if they have not already done so to contact the registration department to sign all necessary forms in order for [place] to release information regarding their care.   Consent: Patient/Guardian gives verbal consent for treatment and assignment of benefits for services provided during this visit. Patient/Guardian expressed understanding and agreed to proceed.     I provided 18 minutes  of non face to face time during this encounter.  Note: This document was prepared by [employer] voice dictation technology and any errors that results from this process are unintentional.    Cleotis Nipper, MD 07/12/2022

## 2022-07-16 DIAGNOSIS — H5213 Myopia, bilateral: Secondary | ICD-10-CM | POA: Diagnosis not present

## 2022-08-27 ENCOUNTER — Encounter: Payer: Self-pay | Admitting: Internal Medicine

## 2022-09-06 ENCOUNTER — Telehealth: Payer: Self-pay | Admitting: Dietician

## 2022-09-06 NOTE — Telephone Encounter (Signed)
Called patient with updated pump settings.  Patient is not available.  Left my number for patient to return call.

## 2022-09-06 NOTE — Telephone Encounter (Signed)
Called patient. She states that she got a new PDM and got her last pump settings from her [employer] account.  She has no further questions and states that her pump settings are up to date with the changes that Orland Penman, MD made at her last appointment.  Oran Rein, RD, LDN, CDCES

## 2022-10-01 ENCOUNTER — Encounter: Payer: Self-pay | Admitting: Internal Medicine

## 2022-10-01 ENCOUNTER — Other Ambulatory Visit: Payer: Self-pay

## 2022-10-01 DIAGNOSIS — E1065 Type 1 diabetes mellitus with hyperglycemia: Secondary | ICD-10-CM

## 2022-10-01 MED ORDER — OMNIPOD 5 DEXG7G6 PODS GEN 5 MISC
1.0000 | 3 refills | Status: DC
Start: 2022-10-01 — End: 2022-12-25

## 2022-10-10 ENCOUNTER — Telehealth (HOSPITAL_COMMUNITY): Payer: No Typology Code available for payment source | Admitting: Psychiatry

## 2022-10-10 ENCOUNTER — Other Ambulatory Visit (HOSPITAL_COMMUNITY): Payer: Self-pay | Admitting: Psychiatry

## 2022-10-10 DIAGNOSIS — F419 Anxiety disorder, unspecified: Secondary | ICD-10-CM

## 2022-10-11 ENCOUNTER — Other Ambulatory Visit: Payer: Self-pay

## 2022-10-11 MED ORDER — DEXCOM G6 TRANSMITTER MISC: 3 refills | Status: DC

## 2022-10-12 ENCOUNTER — Telehealth (HOSPITAL_BASED_OUTPATIENT_CLINIC_OR_DEPARTMENT_OTHER): Payer: No Typology Code available for payment source | Admitting: Psychiatry

## 2022-10-12 ENCOUNTER — Encounter (HOSPITAL_COMMUNITY): Payer: Self-pay | Admitting: Psychiatry

## 2022-10-12 VITALS — Wt 143.0 lb

## 2022-10-12 DIAGNOSIS — F419 Anxiety disorder, unspecified: Secondary | ICD-10-CM

## 2022-10-12 DIAGNOSIS — F331 Major depressive disorder, recurrent, moderate: Secondary | ICD-10-CM | POA: Diagnosis not present

## 2022-10-12 MED ORDER — LAMOTRIGINE 150 MG PO TABS
300.0000 mg | ORAL_TABLET | Freq: Every day | ORAL | 0 refills | Status: DC
Start: 2022-10-12 — End: 2023-03-15

## 2022-10-12 MED ORDER — TRAZODONE HCL 100 MG PO TABS
100.0000 mg | ORAL_TABLET | Freq: Every evening | ORAL | 0 refills | Status: DC | PRN
Start: 2022-10-12 — End: 2023-02-12

## 2022-10-12 MED ORDER — BUPROPION HCL ER (XL) 300 MG PO TB24
300.0000 mg | ORAL_TABLET | Freq: Every day | ORAL | 0 refills | Status: DC
Start: 2022-10-12 — End: 2023-03-15

## 2022-10-12 NOTE — Progress Notes (Signed)
[facility] MD Virtual Progress Note   Patient Location: In Car Provider Location: Home Office  I connect with patient by video and verified that I am speaking with correct person by using two identifiers. I discussed the limitations of evaluation and management by telemedicine and the availability of in person appointments. I also discussed with the patient that there may be a patient responsible charge related to this service. The patient expressed understanding and agreed to proceed.  Sierra Schneider [id number] 37 y.o.  10/12/2022 10:11 AM  History of Present Illness:  Patient is evaluated by video session.  She is doing well on her current medication.  During the summer family went to [place] and they have a good time.  Since school started patient busy taking care of her daughter for her sports events.  She sleeps good with the help of trazodone.  Her last hemoglobin A1c was 6.6 which was done in April.  She denies any crying spells or any feeling of hopelessness or worthlessness.  Patient also happy as brother is working and remain free from drugs.  Patient denies any tremors or shakes or any EPS.  She is taking Wellbutrin, the mental and trazodone.  She has no rash or any itching.  She had appointment coming up with endocrinologist in October.  Past Psychiatric History: H/O depression after her daughter born.  Took Prozac, hydroxyzine, increased Wellbutrin, Effexor, Zoloft and Cymbalta. No H/O suicidal attempt or inpatient, mania and psychosis.     Outpatient Encounter Medications as of 10/12/2022  Medication Sig   ACCU-CHEK GUIDE test strip PLEASE USE TO CHECK BLOOD SUGAR TWICE DAILY   buPROPion (WELLBUTRIN XL) 300 MG 24 hr tablet Take 1 tablet (300 mg total) by mouth daily.   Continuous Glucose Sensor (DEXCOM G6 SENSOR) MISC Use to monitor blood sugar, change after 7 days per patient request   Continuous Glucose Transmitter (DEXCOM G6 TRANSMITTER) MISC Change every 3  months   fluticasone (FLONASE) 50 MCG/ACT nasal spray Place 1-2 sprays into both nostrils daily.   Insulin Disposable Pump (OMNIPOD 5 G6 PODS, GEN 5,) MISC 1 Device by Does not apply route every 3 (three) days.   insulin lispro (HUMALOG) 100 UNIT/ML injection Max daily 60 units   lamoTRIgine (LAMICTAL) 150 MG tablet Take 2 tablets (300 mg total) by mouth daily.   LORazepam (ATIVAN) 0.5 MG tablet TAKE 1 TABLET BY MOUTH EVERY DAY AS NEEDED FOR ANXIETY   ondansetron (ZOFRAN-ODT) 4 MG disintegrating tablet Take 1 tablet (4 mg total) by mouth every 8 (eight) hours as needed for nausea or vomiting.   rosuvastatin (CRESTOR) 20 MG tablet Take 1 tablet (20 mg total) by mouth daily.   traZODone (DESYREL) 100 MG tablet Take 1 tablet (100 mg total) by mouth at bedtime as needed. for sleep   No facility-administered encounter medications on file as of 10/12/2022.    No results found for this or any previous visit (from the past 2160 hour(s)).   Psychiatric Specialty Exam: Physical Exam  Review of Systems  Weight 143 lb (64.9 kg).There is no height or weight on file to calculate BMI.  General Appearance: Casual  Eye Contact:  Good  Speech:  Clear and Coherent and Normal Rate  Volume:  Normal  Mood:  Euthymic  Affect:  Appropriate  Thought Process:  Goal Directed  Orientation:  Full (Time, Place, and Person)  Thought Content:  WDL  Suicidal Thoughts:  No  Homicidal Thoughts:  No  Memory:  Immediate;  Good Recent;   Good Remote;   Good  Judgement:  Intact  Insight:  Present  Psychomotor Activity:  Normal  Concentration:  Concentration: Good and Attention Span: Good  Recall:  Good  Fund of Knowledge:  Good  Language:  Good  Akathisia:  No  Handed:  Right  AIMS (if indicated):     Assets:  Communication Skills Desire for Improvement Housing Resilience Social Support Talents/Skills Transportation  ADL's:  Intact  Cognition:  WNL  Sleep: Good     Assessment/Plan: Moderate episode  of recurrent major depressive disorder (HCC) - Plan: lamoTRIgine (LAMICTAL) 150 MG tablet, buPROPion (WELLBUTRIN XL) 300 MG 24 hr tablet  Anxiety - Plan: lamoTRIgine (LAMICTAL) 150 MG tablet, traZODone (DESYREL) 100 MG tablet  Patient is stable on current medication.  She has no tremor or shakes rash or any itching.  Continue Wellbutrin XL 300 mg daily, Lamictal 150 mg 2 times a day and trazodone 100 mg at bedtime.  Recommend to call us back if you have any question or any concern.  Follow-up in 3 months   Follow Up Instructions:     I discussed the assessment and treatment plan with the patient. The patient was provided an opportunity to ask questions and all were answered. The patient agreed with the plan and demonstrated an understanding of the instructions.   The patient was advised to call back or seek an in-person evaluation if the symptoms worsen or if the condition fails to improve as anticipated.    Collaboration of Care: Other provider involved in patient's care AEB notes are available in epic to review.  Patient/Guardian was advised Release of Information must be obtained prior to any record release in order to collaborate their care with an outside provider. Patient/Guardian was advised if they have not already done so to contact the registration department to sign all necessary forms in order for [place] to release information regarding their care.   Consent: Patient/Guardian gives verbal consent for treatment and assignment of benefits for services provided during this visit. Patient/Guardian expressed understanding and agreed to proceed.     I provided 18 minutes of non face to face time during this encounter.  Note: This document was prepared by [employer] voice dictation technology and any errors that results from this process are unintentional.    Cleotis Nipper, MD 10/12/2022

## 2022-11-21 ENCOUNTER — Telehealth (HOSPITAL_COMMUNITY): Payer: No Typology Code available for payment source | Admitting: Psychiatry

## 2022-12-25 ENCOUNTER — Encounter: Payer: Self-pay | Admitting: Internal Medicine

## 2022-12-25 ENCOUNTER — Ambulatory Visit (INDEPENDENT_AMBULATORY_CARE_PROVIDER_SITE_OTHER): Payer: No Typology Code available for payment source | Admitting: Internal Medicine

## 2022-12-25 VITALS — BP 114/72 | HR 90 | Ht 63.0 in | Wt 139.0 lb

## 2022-12-25 DIAGNOSIS — E1065 Type 1 diabetes mellitus with hyperglycemia: Secondary | ICD-10-CM

## 2022-12-25 DIAGNOSIS — E109 Type 1 diabetes mellitus without complications: Secondary | ICD-10-CM

## 2022-12-25 DIAGNOSIS — E785 Hyperlipidemia, unspecified: Secondary | ICD-10-CM

## 2022-12-25 LAB — POCT GLYCOSYLATED HEMOGLOBIN (HGB A1C): Hemoglobin A1C: 6.3 % — AB (ref 4.0–5.6)

## 2022-12-25 MED ORDER — DEXCOM G7 SENSOR MISC: 1.0000 | 3 refills | Status: DC

## 2022-12-25 MED ORDER — OMNIPOD 5 G7 PODS (GEN 5) MISC: 1.0000 | 3 refills | Status: DC

## 2022-12-25 MED ORDER — ROSUVASTATIN CALCIUM 20 MG PO TABS: 20.0000 mg | ORAL_TABLET | Freq: Every day | ORAL | 3 refills | Status: DC

## 2022-12-25 MED ORDER — INSULIN LISPRO 100 UNIT/ML IJ SOLN: INTRAMUSCULAR | 3 refills | Status: DC

## 2022-12-25 NOTE — Progress Notes (Unsigned)
Name: Sierra Schneider  Age/ Sex: 37 y.o., female   MRN/ DOB: [id number], 12-Oct-1985     PCP: Glendale Chard, DO   Reason for Endocrinology Evaluation: Type 1 Diabetes Mellitus  Initial Endocrine Consultative Visit: 01/02/2013    PATIENT IDENTIFIER: Sierra Schneider is a 37 y.o. female with a past medical history of T1DM and dyslipidemia . The patient has followed with Endocrinology clinic since 01/02/2013 for consultative assistance with management of her diabetes.  DIABETIC HISTORY:  [name] was diagnosed with DM in 2011, has been on an insulin pump since ~ 2012. Her hemoglobin A1c has ranged from 6.1% in 2023, peaking at 8.5% in 2017.  Transitioned from Dr. Lucianne Muss 07/2021   SUBJECTIVE:   During the last visit (06/18/2022):  A1c 6.4 %   Today (12/25/2022): Sierra Schneider  is here for a follow up on diabetes management. She checks her blood sugars multiple  times daily, through CGM. The patient has had hypoglycemic episodes since the last clinic visit, which typically occur 1 x /week - most often occuring during the day . The patient is  symptomatic with these episodes   Denies  nausea or vomiting  Denies changed I n bowel movement    This patient with type 1 diabetes is treated with Omnipod  (insulin pump). During the visit the pump basal and bolus doses were reviewed including carb/insulin rations and supplemental doses. The clinical list was updated. The glucose meter download was reviewed in detail to determine if the current pump settings are providing the best glycemic control without excessive hypoglycemia.  Pump and meter download:     Pump   Omnipod   Settings   Insulin type   Humalog    Basal rate       0000 1.15  u/h    0700 1.4   1000 0.6   1200 1.3   1800 0.9   1900 1.0      I:C ratio       0000  1:6   1200 1:8   1700 1:12          Sensitivity       0000  40      Goal       0000  110             Type & Model of Pump: Omnipod 5 Insulin Type:  Currently using Humalog .  Body mass index is 24.62 kg/m.  PUMP STATISTICS: Average BG: 161 Average Daily Carbs (g): 95.8 Average Total Daily Insulin: 30.2 Average Daily Basal: 19.2 units (64%) Average Daily Bolus: 11 units (36%)   CONTINUOUS GLUCOSE MONITORING RECORD INTERPRETATION    Dates of Recording: 10/16-10/29/2024  Sensor description:dexcom  Results statistics:   CGM use % of time 88.7  Average and SD 161/50  Time in range 69   %  % Time Above 180 25  % Time above 250 6  % Time Below target 0   Glycemic patterns summary: BGs are optimal overnight and fluctuate during the day Hyperglycemic episodes  postprandial   Hypoglycemic episodes occurred n/a  Overnight periods: optimal     HOME DIABETES REGIMEN:  Humalog  Rosuvastatin 20 mg daily    Statin: yes ACE-I/ARB: no Prior Diabetic Education: yes     DIABETIC COMPLICATIONS: Microvascular complications:   Denies: neuropathy, retinopathy, CKD Last Eye Exam: Completed 2024  Macrovascular complications:   Denies: CAD, CVA, PVD   HISTORY:  Past Medical History:  Past Medical  History:  Diagnosis Date   Depression    Diabetes mellitus 08/2009   late onset type 1   Past Surgical History:  Past Surgical History:  Procedure Laterality Date   CESAREAN SECTION     ESOPHAGOGASTRODUODENOSCOPY Left 10/14/2012   Procedure: ESOPHAGOGASTRODUODENOSCOPY (EGD);  Surgeon: Vertell Novak., MD;  Location: [facility] ENDOSCOPY;  Service: Endoscopy;  Laterality: Left;   WISDOM TOOTH EXTRACTION     Social History:  reports that she quit smoking about 8 years ago. Her smoking use included cigarettes. She has never used smokeless tobacco. She reports current alcohol use. She reports that she does not use drugs. Family History:  Family History  Problem Relation Age of Onset   Depression Mother    Hypertension Father    Hyperlipidemia Father    Kidney disease Paternal Grandmother    Diabetes Maternal Grandfather     ADD / ADHD Brother    Depression Brother    Drug abuse Brother    Depression Maternal Aunt    Depression Paternal Aunt    Psychosis Cousin    Heart disease Neg Hx      HOME MEDICATIONS: Allergies as of 12/25/2022   No Known Allergies      Medication List        Accurate as of December 25, 2022  2:54 PM. If you have any questions, ask your nurse or doctor.          Accu-Chek Guide test strip Generic drug: glucose blood PLEASE USE TO CHECK BLOOD SUGAR TWICE DAILY   buPROPion 300 MG 24 hr tablet Commonly known as: WELLBUTRIN XL Take 1 tablet (300 mg total) by mouth daily.   Dexcom G6 Sensor Misc Use to monitor blood sugar, change after 7 days per patient request   Dexcom G6 Transmitter Misc Change every 3 months   fluticasone 50 MCG/ACT nasal spray Commonly known as: FLONASE Place 1-2 sprays into both nostrils daily.   insulin lispro 100 UNIT/ML injection Commonly known as: HumaLOG Max daily 60 units   lamoTRIgine 150 MG tablet Commonly known as: LAMICTAL Take 2 tablets (300 mg total) by mouth daily.   LORazepam 0.5 MG tablet Commonly known as: ATIVAN TAKE 1 TABLET BY MOUTH EVERY DAY AS NEEDED FOR ANXIETY   Omnipod 5 DexG7G6 Pods Gen 5 Misc 1 Device by Does not apply route every 3 (three) days.   ondansetron 4 MG disintegrating tablet Commonly known as: ZOFRAN-ODT Take 1 tablet (4 mg total) by mouth every 8 (eight) hours as needed for nausea or vomiting.   rosuvastatin 20 MG tablet Commonly known as: CRESTOR Take 1 tablet (20 mg total) by mouth daily.   traZODone 100 MG tablet Commonly known as: DESYREL Take 1 tablet (100 mg total) by mouth at bedtime as needed. for sleep         OBJECTIVE:   Vital Signs: BP 114/72 (BP Location: Left Arm, Patient Position: Sitting, Cuff Size: Small)   Pulse 90   Ht 5\' 3"  (1.6 m)   Wt 139 lb (63 kg)   SpO2 99%   BMI 24.62 kg/m   Wt Readings from Last 3 Encounters:  12/25/22 139 lb (63 kg)  06/18/22  143 lb (64.9 kg)  12/13/21 135 lb (61.2 kg)     Exam: General: Pt appears well and is in NAD  Lungs: Clear with good BS bilat   Heart: RRR   Abdomen: soft, nontender  Extremities: No pretibial edema.   Neuro: MS is good with  appropriate affect, pt is alert and Ox3    DM Foot Exam 12/25/2022  The skin of the feet is intact without sores or ulcerations. The pedal pulses are 2+ on right and 2+ on left. The sensation is intact to a screening 5.07, 10 gram monofilament bilaterally   DATA REVIEWED:  Lab Results  Component Value Date   HGBA1C 6.6 (A) 06/18/2022   HGBA1C 6.4 (A) 12/13/2021   HGBA1C 6.2 (A) 08/04/2021    Latest Reference Range & Units 12/25/22 15:21  Sodium 135 - 145 mEq/L 140  Potassium 3.5 - 5.1 mEq/L 4.2  Chloride 96 - 112 mEq/L 103  CO2 19 - 32 mEq/L 28  Glucose 70 - 99 mg/dL 409 (H)  BUN 6 - 23 mg/dL 5 (L)  Creatinine 8.11 - 1.20 mg/dL 9.14  Calcium 8.4 - 78.2 mg/dL 9.1  GFR >95.62 mL/min 111.83  Total CHOL/HDL Ratio  2  Cholesterol 0 - 200 mg/dL 130  HDL Cholesterol >86.57 mg/dL 84.69  LDL (calc) 0 - 99 mg/dL 46  MICROALB/CREAT RATIO 0.0 - 30.0 mg/g 0.8  NonHDL  62.03  Triglycerides 0.0 - 149.0 mg/dL 62.9  VLDL 0.0 - 52.8 mg/dL 41.3    Latest Reference Range & Units 12/25/22 15:21  TSH 0.35 - 5.50 uIU/mL 0.92  T4,Free(Direct) 0.60 - 1.60 ng/dL 2.44    Latest Reference Range & Units 12/25/22 15:21  Creatinine,U mg/dL 01.0  Microalb, Ur 0.0 - 1.9 mg/dL <2.7  MICROALB/CREAT RATIO 0.0 - 30.0 mg/g 0.8      ASSESSMENT / PLAN / RECOMMENDATIONS:   1) Type 1 Diabetes Mellitus, optimally controlled, Without complications - Most recent A1c of 6.3%. Goal A1c < 7.0 %.    -A1c remain at goal  -No changes to the pump settings at this time -A new prescription for OmniPod pods and Dexcom G7 has been sent to the pharmacy -BMP, lipid, TFTs, MA/CR ratio normal   MEDICATIONS: Humalog    Pump   Omnipod   Settings   Insulin type   Humalog    Basal  rate       0000 1.15  u/h    0700 1.4   1000 0.6   1200 1.3   1800 0.9   1900 1.0      I:C ratio       0000  1:6   1200 1:8   1700 1:12          Sensitivity       0000  40      Goal       0000  110           EDUCATION / INSTRUCTIONS: BG monitoring instructions: Patient is instructed to check her blood sugars 4 times a day, before meals and bedtime. Call [facility] if: BG persistently < 70  I reviewed the Rule of 15 for the treatment of hypoglycemia in detail with the patient. Literature supplied.   2) Diabetic complications:  Eye: Does not have known diabetic retinopathy.  Neuro/ Feet: Does not have known diabetic peripheral neuropathy .  Renal: Patient does not have known baseline CKD. She   is not on an ACEI/ARB at present.   3) Dyslipidemia:  -Lipid panel  at goal    Medication Continue rosuvastatin 20 mg daily    F/U in 6 months      Signed electronically by: Lyndle Herrlich, MD  [facility]  [facility] [address] [place],  [place] [address] Phone: [phone] FAX: [phone]   CC: Glendale Chard, DO [address] [place] [address] Phone: [phone]  Fax: [phone]  Return to Endocrinology clinic as below: Future Appointments  Date Time Provider Department Center  12/25/2022  3:00 PM Raela Bohl, Konrad Dolores, MD [id number] None

## 2022-12-25 NOTE — Patient Instructions (Signed)

## 2022-12-26 ENCOUNTER — Encounter: Payer: Self-pay | Admitting: Internal Medicine

## 2022-12-26 LAB — BASIC METABOLIC PANEL
BUN: 5 mg/dL — ABNORMAL LOW (ref 6–23)
CO2: 28 meq/L (ref 19–32)
Calcium: 9.1 mg/dL (ref 8.4–10.5)
Chloride: 103 meq/L (ref 96–112)
Creatinine, Ser: 0.64 mg/dL (ref 0.40–1.20)
GFR: 111.83 mL/min (ref >60.00)
Glucose, Bld: 120 mg/dL — ABNORMAL HIGH (ref 70–99)
Potassium: 4.2 meq/L (ref 3.5–5.1)
Sodium: 140 meq/L (ref 135–145)

## 2022-12-26 LAB — MICROALBUMIN / CREATININE URINE RATIO
Creatinine,U: 84.9 mg/dL
Microalb Creat Ratio: 0.8 mg/g (ref 0.0–30.0)
Microalb, Ur: <0.7 mg/dL (ref 0.0–1.9)

## 2022-12-26 LAB — LIPID PANEL
Cholesterol: 122 mg/dL (ref 0–200)
HDL: 60.4 mg/dL (ref >39.00)
LDL Cholesterol: 46 mg/dL (ref 0–99)
NonHDL: 62.03
Total CHOL/HDL Ratio: 2
Triglycerides: 82 mg/dL (ref 0.0–149.0)
VLDL: 16.4 mg/dL (ref 0.0–40.0)

## 2022-12-26 LAB — T4, FREE: Free T4: 0.94 ng/dL (ref 0.60–1.60)

## 2022-12-26 LAB — TSH: TSH: 0.92 u[IU]/mL (ref 0.35–5.50)

## 2023-02-05 ENCOUNTER — Other Ambulatory Visit (HOSPITAL_COMMUNITY): Payer: Self-pay | Admitting: Psychiatry

## 2023-02-05 DIAGNOSIS — F419 Anxiety disorder, unspecified: Secondary | ICD-10-CM

## 2023-02-12 ENCOUNTER — Other Ambulatory Visit (HOSPITAL_COMMUNITY): Payer: Self-pay

## 2023-02-12 DIAGNOSIS — F419 Anxiety disorder, unspecified: Secondary | ICD-10-CM

## 2023-02-12 DIAGNOSIS — F331 Major depressive disorder, recurrent, moderate: Secondary | ICD-10-CM

## 2023-02-12 MED ORDER — TRAZODONE HCL 100 MG PO TABS: 100.0000 mg | ORAL_TABLET | Freq: Every evening | ORAL | 0 refills | Status: DC | PRN

## 2023-02-22 ENCOUNTER — Telehealth (HOSPITAL_COMMUNITY): Payer: No Typology Code available for payment source | Admitting: Psychiatry

## 2023-03-07 ENCOUNTER — Other Ambulatory Visit (HOSPITAL_COMMUNITY): Payer: Self-pay | Admitting: Psychiatry

## 2023-03-07 DIAGNOSIS — F419 Anxiety disorder, unspecified: Secondary | ICD-10-CM

## 2023-03-15 ENCOUNTER — Telehealth (HOSPITAL_BASED_OUTPATIENT_CLINIC_OR_DEPARTMENT_OTHER): Payer: No Typology Code available for payment source | Admitting: Psychiatry

## 2023-03-15 ENCOUNTER — Encounter (HOSPITAL_COMMUNITY): Payer: Self-pay | Admitting: Psychiatry

## 2023-03-15 VITALS — Wt 139.0 lb

## 2023-03-15 DIAGNOSIS — F419 Anxiety disorder, unspecified: Secondary | ICD-10-CM

## 2023-03-15 DIAGNOSIS — F331 Major depressive disorder, recurrent, moderate: Secondary | ICD-10-CM

## 2023-03-15 MED ORDER — LAMOTRIGINE 150 MG PO TABS: 300.0000 mg | ORAL_TABLET | Freq: Every day | ORAL | 0 refills | Status: DC

## 2023-03-15 MED ORDER — TRAZODONE HCL 100 MG PO TABS: 100.0000 mg | ORAL_TABLET | Freq: Every evening | ORAL | 0 refills | Status: DC | PRN

## 2023-03-15 MED ORDER — BUPROPION HCL ER (XL) 300 MG PO TB24: 300.0000 mg | ORAL_TABLET | Freq: Every day | ORAL | 0 refills | Status: DC

## 2023-03-15 NOTE — Progress Notes (Signed)
[facility] MD Virtual Progress Note   Patient Location: In Car Provider Location: Home Office  I connect with patient by video and verified that I am speaking with correct person by using two identifiers. I discussed the limitations of evaluation and management by telemedicine and the availability of in person appointments. I also discussed with the patient that there may be a patient responsible charge related to this service. The patient expressed understanding and agreed to proceed.  Sierra Schneider [id number] 38 y.o.  03/15/2023 10:26 AM  History of Present Illness:  Patient is evaluated by video session.  She had a good Christmas and holidays.  She reported lately very busy because school started and her daughter who is in ninth grade started volleyball and she is taking her to the games.  Her weekends are mostly busy as traveling out of the town.  She reported family life is good.  Her brother is now living with the parents and working at a new job.  She is happy that brother is doing very well.  She denies any panic attack, crying spells or any feeling of hopelessness or worthlessness.  She is taking Wellbutrin, Lamictal and sometimes trazodone when she cannot sleep.  She has no rash, itching, tremors or shakes.  She denies any suicidal thoughts or any feeling of hopelessness.  Her appetite is okay.  Her weight is stable.  Recently she had a blood work and her hemoglobin A1c is 6.3.  Other labs are stable.  She is seeing endocrinologist on a regular basis.  She has no tremors, rash, itching.  She wants to continue current medication.  She has not taken Ativan more than a year.  Past Psychiatric History: H/O depression after her daughter born.  Took Prozac, hydroxyzine, increased Wellbutrin, Effexor, Zoloft and Cymbalta. No H/O suicidal attempt or inpatient, mania and psychosis.     Outpatient Encounter Medications as of 03/15/2023  Medication Sig   ACCU-CHEK GUIDE test  strip PLEASE USE TO CHECK BLOOD SUGAR TWICE DAILY   buPROPion (WELLBUTRIN XL) 300 MG 24 hr tablet Take 1 tablet (300 mg total) by mouth daily.   Continuous Glucose Sensor (DEXCOM G7 SENSOR) MISC 1 Device by Does not apply route as directed.   fluticasone (FLONASE) 50 MCG/ACT nasal spray Place 1-2 sprays into both nostrils daily.   Insulin Disposable Pump (OMNIPOD 5 G7 PODS, GEN 5,) MISC 1 Device by Does not apply route every other day.   insulin lispro (HUMALOG) 100 UNIT/ML injection Max daily 60 units   lamoTRIgine (LAMICTAL) 150 MG tablet Take 2 tablets (300 mg total) by mouth daily.   LORazepam (ATIVAN) 0.5 MG tablet TAKE 1 TABLET BY MOUTH EVERY DAY AS NEEDED FOR ANXIETY   ondansetron (ZOFRAN-ODT) 4 MG disintegrating tablet Take 1 tablet (4 mg total) by mouth every 8 (eight) hours as needed for nausea or vomiting.   rosuvastatin (CRESTOR) 20 MG tablet Take 1 tablet (20 mg total) by mouth daily.   traZODone (DESYREL) 100 MG tablet Take 1 tablet (100 mg total) by mouth at bedtime as needed. for sleep   No facility-administered encounter medications on file as of 03/15/2023.    Recent Results (from the past 2160 hours)  POCT glycosylated hemoglobin (Hb A1C)     Status: Abnormal   Collection Time: 12/25/22  2:57 PM  Result Value Ref Range   Hemoglobin A1C 6.3 (A) 4.0 - 5.6 %   HbA1c POC (<> result, manual entry)     HbA1c,  POC (prediabetic range)     HbA1c, POC (controlled diabetic range)    TSH     Status: None   Collection Time: 12/25/22  3:21 PM  Result Value Ref Range   TSH 0.92 0.35 - 5.50 uIU/mL  T4, free     Status: None   Collection Time: 12/25/22  3:21 PM  Result Value Ref Range   Free T4 0.94 0.60 - 1.60 ng/dL    Comment: Specimens from patients who are undergoing biotin therapy and /or ingesting biotin supplements may contain high levels of biotin.  The higher biotin concentration in these specimens interferes with this Free T4 assay.  Specimens that contain high levels  of  biotin may cause false high results for this Free T4 assay.  Please interpret results in light of the total clinical presentation of the patient.    Microalbumin / creatinine urine ratio     Status: None   Collection Time: 12/25/22  3:21 PM  Result Value Ref Range   Microalb, Ur <0.7 0.0 - 1.9 mg/dL   Creatinine,U 16.1 mg/dL   Microalb Creat Ratio 0.8 0.0 - 30.0 mg/g  Lipid panel     Status: None   Collection Time: 12/25/22  3:21 PM  Result Value Ref Range   Cholesterol 122 0 - 200 mg/dL    Comment: ATP III Classification       Desirable:  < 200 mg/dL               Borderline High:  200 - 239 mg/dL          High:  > = 096 mg/dL   Triglycerides 04.5 0.0 - 149.0 mg/dL    Comment: Normal:  <409 mg/dLBorderline High:  150 - 199 mg/dL   HDL 81.19 >14.78 mg/dL   VLDL 29.5 0.0 - 62.1 mg/dL   LDL Cholesterol 46 0 - 99 mg/dL   Total CHOL/HDL Ratio 2     Comment:                Men          Women1/2 Average Risk     3.4          3.3Average Risk          5.0          4.42X Average Risk          9.6          7.13X Average Risk          15.0          11.0                       NonHDL 62.03     Comment: NOTE:  Non-HDL goal should be 30 mg/dL higher than patient's LDL goal (i.e. LDL goal of < 70 mg/dL, would have non-HDL goal of < 100 mg/dL)  Basic metabolic panel     Status: Abnormal   Collection Time: 12/25/22  3:21 PM  Result Value Ref Range   Sodium 140 135 - 145 mEq/L   Potassium 4.2 3.5 - 5.1 mEq/L   Chloride 103 96 - 112 mEq/L   CO2 28 19 - 32 mEq/L   Glucose, Bld 120 (H) 70 - 99 mg/dL   BUN 5 (L) 6 - 23 mg/dL   Creatinine, Ser 3.08 0.40 - 1.20 mg/dL   GFR 657.84 >69.62 mL/min    Comment: Calculated using the CKD-EPI Creatinine Equation (  2021)   Calcium 9.1 8.4 - 10.5 mg/dL     Psychiatric Specialty Exam: Physical Exam  Review of Systems  Weight 139 lb (63 kg).There is no height or weight on file to calculate BMI.  General Appearance: Well Groomed  Eye Contact:  Good  Speech:   Clear and Coherent  Volume:  Normal  Mood:  Euthymic  Affect:  Appropriate  Thought Process:  Goal Directed  Orientation:  Full (Time, Place, and Person)  Thought Content:  WDL  Suicidal Thoughts:  No  Homicidal Thoughts:  No  Memory:  Immediate;   Good Recent;   Good Remote;   Good  Judgement:  Good  Insight:  Good  Psychomotor Activity:  Normal  Concentration:  Concentration: Good and Attention Span: Good  Recall:  Good  Fund of Knowledge:  Good  Language:  Good  Akathisia:  No  Handed:  Right  AIMS (if indicated):     Assets:  Communication Skills Desire for Improvement Financial Resources/Insurance Housing Physical Health Resilience Social Support Talents/Skills Transportation  ADL's:  Intact  Cognition:  WNL  Sleep:  ok     Assessment/Plan: Moderate episode of recurrent major depressive disorder (HCC) - Plan: buPROPion (WELLBUTRIN XL) 300 MG 24 hr tablet, lamoTRIgine (LAMICTAL) 150 MG tablet  Anxiety - Plan: traZODone (DESYREL) 100 MG tablet, lamoTRIgine (LAMICTAL) 150 MG tablet  Patient is stable on current medication.  She has no major concern.  She takes the trazodone as needed and need a new prescription.  Continue Lamictal 150 mg 2 times a day, Wellbutrin XL 300 mg daily.  Recommended to call us back if she is any question or any concern.  Follow-up in 3 months.   Follow Up Instructions:     I discussed the assessment and treatment plan with the patient. The patient was provided an opportunity to ask questions and all were answered. The patient agreed with the plan and demonstrated an understanding of the instructions.   The patient was advised to call back or seek an in-person evaluation if the symptoms worsen or if the condition fails to improve as anticipated.    Collaboration of Care: Other provider involved in patient's care AEB notes are available in epic to review  Patient/Guardian was advised Release of Information must be obtained prior to any  record release in order to collaborate their care with an outside provider. Patient/Guardian was advised if they have not already done so to contact the registration department to sign all necessary forms in order for [place] to release information regarding their care.   Consent: Patient/Guardian gives verbal consent for treatment and assignment of benefits for services provided during this visit. Patient/Guardian expressed understanding and agreed to proceed.     I provided 17 minutes of non face to face time during this encounter.  Note: This document was prepared by [employer] voice dictation technology and any errors that results from this process are unintentional.    Cleotis Nipper, MD 03/15/2023

## 2023-04-10 ENCOUNTER — Encounter: Payer: Self-pay | Admitting: Internal Medicine

## 2023-06-03 ENCOUNTER — Other Ambulatory Visit (HOSPITAL_COMMUNITY): Payer: Self-pay | Admitting: Psychiatry

## 2023-06-03 DIAGNOSIS — F419 Anxiety disorder, unspecified: Secondary | ICD-10-CM

## 2023-06-06 ENCOUNTER — Other Ambulatory Visit (HOSPITAL_COMMUNITY): Payer: Self-pay

## 2023-06-06 DIAGNOSIS — F419 Anxiety disorder, unspecified: Secondary | ICD-10-CM

## 2023-06-06 MED ORDER — TRAZODONE HCL 100 MG PO TABS: 100.0000 mg | ORAL_TABLET | Freq: Every evening | ORAL | 0 refills | Status: DC | PRN

## 2023-06-11 ENCOUNTER — Ambulatory Visit: Payer: No Typology Code available for payment source | Admitting: Internal Medicine

## 2023-06-11 VITALS — BP 110/60 | HR 76 | Ht 63.0 in | Wt 140.0 lb

## 2023-06-11 DIAGNOSIS — E109 Type 1 diabetes mellitus without complications: Secondary | ICD-10-CM

## 2023-06-11 DIAGNOSIS — E785 Hyperlipidemia, unspecified: Secondary | ICD-10-CM

## 2023-06-11 LAB — POCT GLYCOSYLATED HEMOGLOBIN (HGB A1C): Hemoglobin A1C: 6.6 % — AB (ref 4.0–5.6)

## 2023-06-11 MED ORDER — DEXCOM G7 SENSOR MISC: 1.0000 | 3 refills | Status: DC

## 2023-06-11 MED ORDER — ACCU-CHEK GUIDE TEST VI STRP: 1.0000 | ORAL_STRIP | Freq: Every day | 3 refills | Status: DC

## 2023-06-11 MED ORDER — ROSUVASTATIN CALCIUM 20 MG PO TABS: 20.0000 mg | ORAL_TABLET | Freq: Every day | ORAL | 3 refills | Status: DC

## 2023-06-11 MED ORDER — OMNIPOD 5 G7 PODS (GEN 5) MISC: 1.0000 | 3 refills | Status: DC

## 2023-06-11 MED ORDER — INSULIN LISPRO 100 UNIT/ML IJ SOLN: INTRAMUSCULAR | 3 refills | Status: DC

## 2023-06-11 NOTE — Patient Instructions (Signed)

## 2023-06-11 NOTE — Progress Notes (Signed)
 Name: Sierra Schneider  Age/ Sex: 38 y.o., female   MRN/ DOB: [id number], 01-25-1986     PCP: Glendale Chard, DO   Reason for Endocrinology Evaluation: Type 1 Diabetes Mellitus  Initial Endocrine Consultative Visit: 01/02/2013    PATIENT IDENTIFIER: Sierra Schneider is a 38 y.o. female with a past medical history of T1DM and dyslipidemia . The patient has followed with Endocrinology clinic since 01/02/2013 for consultative assistance with management of her diabetes.  DIABETIC HISTORY:  Sierra Schneider was diagnosed with DM in 2011, has been on an insulin pump since ~ 2012. Her hemoglobin A1c has ranged from 6.1% in 2023, peaking at 8.5% in 2017.  Transitioned from Dr. Lucianne Muss 07/2021   SUBJECTIVE:   During the last visit (12/25/2022):  A1c 6.3 %   Today (06/11/2023): Sierra Schneider  is here for a follow up on diabetes management. She checks her blood sugars multiple  times daily, through CGM. The patient has had not hypoglycemic episodes since the last clinic visit.  Denies  nausea or vomiting  Denies constipation or diarrhea     This patient with type 1 diabetes is treated with Omnipod  (insulin pump). During the visit the pump basal and bolus doses were reviewed including carb/insulin rations and supplemental doses. The clinical list was updated. The glucose meter download was reviewed in detail to determine if the current pump settings are providing the best glycemic control without excessive hypoglycemia.  Pump and meter download:     Pump   Omnipod   Settings   Insulin type   Humalog    Basal rate       0000 1.15  u/h    0700 1.4   1000 0.6   1200 1.3   1800 0.9   1900 1.0      I:C ratio       0000  1:6   1200 1:8   1700 1:12          Sensitivity       0000  40      Goal       0000  110           Type & Model of Pump: Omnipod 5 Insulin Type: Currently using Humalog .  Body mass index is 24.8 kg/m.  PUMP STATISTICS: Average BG: 178 Average Daily Carbs  (g): 112.3 Average Total Daily Insulin: 40.5 Average Daily Basal: 25.3 units (62%) Average Daily Bolus: 15.2 units (38%)   CONTINUOUS GLUCOSE MONITORING RECORD INTERPRETATION    Dates of Recording: 4/2-4/15/2025  Sensor description:dexcom  Results statistics:   CGM use % of time 92.8  Average and SD 178/71  Time in range 60  %  % Time Above 180 24  % Time above 250 15  % Time Below target 1   Glycemic patterns summary: BGs are optimal overnight and fluctuate during the day Hyperglycemic episodes  postprandial   Hypoglycemic episodes occurred post bolus  Overnight periods: optimal     HOME DIABETES REGIMEN:  Humalog  Rosuvastatin 20 mg daily    Statin: yes ACE-I/ARB: no Prior Diabetic Education: yes     DIABETIC COMPLICATIONS: Microvascular complications:   Denies: neuropathy, retinopathy, CKD Last Eye Exam: Completed 2024  Macrovascular complications:   Denies: CAD, CVA, PVD   HISTORY:  Past Medical History:  Past Medical History:  Diagnosis Date   Depression    Diabetes mellitus 08/2009   late onset type 1   Past Surgical History:  Past Surgical History:  Procedure Laterality Date   CESAREAN SECTION     ESOPHAGOGASTRODUODENOSCOPY Left 10/14/2012   Procedure: ESOPHAGOGASTRODUODENOSCOPY (EGD);  Surgeon: Venson Ginger., MD;  [facility] END[facility]COPY;  Service: Endoscopy;  Laterality: Left;   WISDOM TOOTH EXTRACTION     Social History:  reports that she quit smoking about 9 years ago. Her smoking use included cigarettes. She has never used smokeless tobacco. She reports current alcohol use. She reports that she does not use drugs. Family History:  Family History  Problem Relation Age of Onset   Depression Mother    Hypertension Father    Hyperlipidemia Father    Kidney disease Paternal Grandmother    Diabetes Maternal Grandfather    ADD / ADHD Brother    Depression Brother    Drug abuse Brother    Depression Maternal Aunt    Depression  Paternal Aunt    Psychosis Cousin    Heart disease Neg Hx      HOME MEDICATIONS: Allergies as of 06/11/2023   No Known Allergies      Medication List        Accurate as of June 11, 2023  3:14 PM. If you have any questions, ask your nurse or doctor.          Accu-Chek Guide test strip Generic drug: glucose blood PLEASE USE TO CHECK BLOOD SUGAR TWICE DAILY   buPROPion 300 MG 24 hr tablet Commonly known as: WELLBUTRIN XL Take 1 tablet (300 mg total) by mouth daily.   Dexcom G7 Sensor Misc 1 Device by Does not apply route as directed.   fluticasone 50 MCG/ACT nasal spray Commonly known as: FLONASE Place 1-2 sprays into both nostrils daily.   insulin lispro 100 UNIT/ML injection Commonly known as: HumaLOG Max daily 60 units   lamoTRIgine 150 MG tablet Commonly known as: LAMICTAL Take 2 tablets (300 mg total) by mouth daily.   LORazepam 0.5 MG tablet Commonly known as: ATIVAN TAKE 1 TABLET BY MOUTH EVERY DAY AS NEEDED FOR ANXIETY   Omnipod 5 G7 Pods (Gen 5) Misc 1 Device by Does not apply route every other day.   ondansetron 4 MG disintegrating tablet Commonly known as: ZOFRAN-ODT Take 1 tablet (4 mg total) by mouth every 8 (eight) hours as needed for nausea or vomiting.   rosuvastatin 20 MG tablet Commonly known as: CRESTOR Take 1 tablet (20 mg total) by mouth daily.   traZODone 100 MG tablet Commonly known as: DESYREL Take 1 tablet (100 mg total) by mouth at bedtime as needed. for sleep. Bridge to patient appt on 4/17         OBJECTIVE:   Vital Signs: BP 110/60   Pulse 76   Ht 5\' 3"  (1.6 m)   Wt 140 lb (63.5 kg)   SpO2 99%   BMI 24.80 kg/m   Wt Readings from Last 3 Encounters:  06/11/23 140 lb (63.5 kg)  12/25/22 139 lb (63 kg)  06/18/22 143 lb (64.9 kg)     Exam: General: Pt appears well and is in NAD  Lungs: Clear with good BS bilat   Heart: RRR   Abdomen: soft, nontender  Extremities: No pretibial edema.   Neuro: MS is good  with appropriate affect, pt is alert and Ox3    DM Foot Exam 12/25/2022  The skin of the feet is intact without sores or ulcerations. The pedal pulses are 2+ on right and 2+ on left. The sensation is intact to a  screening 5.07, 10 gram monofilament bilaterally   DATA REVIEWED:  Lab Results  Component Value Date   HGBA1C 6.3 (A) 12/25/2022   HGBA1C 6.6 (A) 06/18/2022   HGBA1C 6.4 (A) 12/13/2021    Latest Reference Range & Units 12/25/22 15:21  Sodium 135 - 145 mEq/L 140  Potassium 3.5 - 5.1 mEq/L 4.2  Chloride 96 - 112 mEq/L 103  CO2 19 - 32 mEq/L 28  Glucose 70 - 99 mg/dL 161 (H)  BUN 6 - 23 mg/dL 5 (L)  Creatinine 0.96 - 1.20 mg/dL 0.45  Calcium 8.4 - 40.9 mg/dL 9.1  GFR >81.19 mL/min 111.83  Total CHOL/HDL Ratio  2  Cholesterol 0 - 200 mg/dL 147  HDL Cholesterol >82.95 mg/dL 62.13  LDL (calc) 0 - 99 mg/dL 46  MICROALB/CREAT RATIO 0.0 - 30.0 mg/g 0.8  NonHDL  62.03  Triglycerides 0.0 - 149.0 mg/dL 08.6  VLDL 0.0 - 57.8 mg/dL 46.9    Latest Reference Range & Units 12/25/22 15:21  TSH 0.35 - 5.50 uIU/mL 0.92  T4,Free(Direct) 0.60 - 1.60 ng/dL 6.29    Latest Reference Range & Units 12/25/22 15:21  Creatinine,U mg/dL 52.8  Microalb, Ur 0.0 - 1.9 mg/dL <4.1  MICROALB/CREAT RATIO 0.0 - 30.0 mg/g 0.8      ASSESSMENT / PLAN / RECOMMENDATIONS:   1) Type 1 Diabetes Mellitus, optimally controlled, Without complications - Most recent A1c of 6.6%. Goal A1c < 7.0 %.    -A1c remain at goal  - In reviewing CGM/pump download  the pt has been noted with optimal BG's overnight, BGs fluctuate during the day due to insulin-carbohydrate mismatch -Encourage continuous carbohydrate counting and entering into the pump before the meal -No changes to the pump settings at this time - Up-to-date on labs  MEDICATIONS: Humalog    Pump   Omnipod   Settings   Insulin type   Humalog    Basal rate       0000 1.15  u/h    0700 1.4   1000 0.6   1200 1.3   1800 0.9   1900 1.0       I:C ratio       0000  1:6   1200 1:8   1700 1:12          Sensitivity       0000  40      Goal       0000  110           EDUCATION / INSTRUCTIONS: BG monitoring instructions: Patient is instructed to check her blood sugars 4 times a day, before meals and bedtime. Call  Endocrinology clinic if: BG persistently < 70  I reviewed the Rule of 15 for the treatment of hypoglycemia in detail with the patient. Literature supplied.   2) Diabetic complications:  Eye: Does not have known diabetic retinopathy.  Neuro/ Feet: Does not have known diabetic peripheral neuropathy .  Renal: Patient does not have known baseline CKD. She   is not on an ACEI/ARB at present.   3) Dyslipidemia:  -Lipid panel  at goal    Medication Continue rosuvastatin 20 mg daily    F/U in 6 months      Signed electronically by: Lyndle Herrlich, MD  [facility] Endocrinology  [facility] [address] [place] [place], [place] [address] Phone: [phone] FAX: [phone]   CC: Glendale Chard, DO [address] [place] [address] Phone: [phone]  Fax: [phone]  Return to Endocrinology clinic as below: Future Appointments  Date Time Provider Department Center  06/13/2023 10:40 AM Arfeen, Bronson Canny, MD [facility] None

## 2023-06-13 ENCOUNTER — Telehealth (HOSPITAL_BASED_OUTPATIENT_CLINIC_OR_DEPARTMENT_OTHER): Payer: No Typology Code available for payment source | Admitting: Psychiatry

## 2023-06-13 ENCOUNTER — Encounter (HOSPITAL_COMMUNITY): Payer: Self-pay | Admitting: Psychiatry

## 2023-06-13 ENCOUNTER — Encounter: Payer: Self-pay | Admitting: Internal Medicine

## 2023-06-13 ENCOUNTER — Other Ambulatory Visit: Payer: Self-pay | Admitting: Internal Medicine

## 2023-06-13 VITALS — Wt 140.0 lb

## 2023-06-13 DIAGNOSIS — F419 Anxiety disorder, unspecified: Secondary | ICD-10-CM | POA: Diagnosis not present

## 2023-06-13 DIAGNOSIS — F331 Major depressive disorder, recurrent, moderate: Secondary | ICD-10-CM

## 2023-06-13 MED ORDER — LAMOTRIGINE 150 MG PO TABS: 300.0000 mg | ORAL_TABLET | Freq: Every day | ORAL | 0 refills | Status: DC

## 2023-06-13 MED ORDER — TRAZODONE HCL 100 MG PO TABS: 100.0000 mg | ORAL_TABLET | Freq: Every evening | ORAL | 2 refills | Status: DC | PRN

## 2023-06-13 MED ORDER — BUPROPION HCL ER (XL) 300 MG PO TB24: 300.0000 mg | ORAL_TABLET | Freq: Every day | ORAL | 0 refills | Status: DC

## 2023-06-13 NOTE — Progress Notes (Signed)
 [facility] MD Virtual Progress Note   Patient Location: Work Provider Location: Office  I connect with patient by video and verified that I am speaking with correct person by using two identifiers. I discussed the limitations of evaluation and management by telemedicine and the availability of in person appointments. I also discussed with the patient that there may be a patient responsible charge related to this service. The patient expressed understanding and agreed to proceed.  Sierra Schneider [id number] 38 y.o.  06/13/2023 10:54 AM  History of Present Illness:  Patient is evaluated by video session.  She is doing well on her current medication.  Recently she had a visit with the endocrinologist and her hemoglobin A1c is stable.  She denies any irritability, panic attack, crying spells or any feeling of hopelessness or worthlessness.  Patient told she had a month off as daughter was not playing volleyball but now season resume and she will be traveling and taking her to the volleyball games.  Her appetite is okay.  Her sleep is good.  She reported some concern due to upcoming tarrif that may affect the business and ultimately her job.  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  Her appetite is okay.  Her weight is stable.  She is looking forward to have upcoming to vacation.  Her plan is to visit because and [place] and then in August she is going for beach vacation with the family.  Patient wants to keep the current medication.  She has not taken Ativan more than a year and a half.  She reported Lamictal and Wellbutrin keeping her mood stable.  She is taking trazodone more frequently because of the stress at work but denies any panic attack.  Past Psychiatric History: H/O depression after her daughter born.  Took Prozac, hydroxyzine, increased Wellbutrin, Effexor, Zoloft and Cymbalta. No H/O suicidal attempt or inpatient, mania and  psychosis.     Outpatient Encounter Medications as of 06/13/2023  Medication Sig   buPROPion (WELLBUTRIN XL) 300 MG 24 hr tablet Take 1 tablet (300 mg total) by mouth daily.   Continuous Glucose Sensor (DEXCOM G7 SENSOR) MISC 1 Device by Does not apply route as directed. Every 10 days   fluticasone (FLONASE) 50 MCG/ACT nasal spray Place 1-2 sprays into both nostrils daily.   glucose blood (ACCU-CHEK GUIDE TEST) test strip 1 each by Other route daily in the afternoon. Use as instructed   Insulin Disposable Pump (OMNIPOD 5 G7 PODS, GEN 5,) MISC 1 Device by Does not apply route every other day.   insulin lispro (HUMALOG) 100 UNIT/ML injection Max daily 60 units   lamoTRIgine (LAMICTAL) 150 MG tablet Take 2 tablets (300 mg total) by mouth daily.   LORazepam (ATIVAN) 0.5 MG tablet TAKE 1 TABLET BY MOUTH EVERY DAY AS NEEDED FOR ANXIETY   ondansetron (ZOFRAN-ODT) 4 MG disintegrating tablet Take 1 tablet (4 mg total) by mouth every 8 (eight) hours as needed for nausea or vomiting.   rosuvastatin (CRESTOR) 20 MG tablet Take 1 tablet (20 mg total) by mouth daily.   traZODone (DESYREL) 100 MG tablet Take 1 tablet (100 mg total) by mouth at bedtime as needed. for sleep. Bridge to patient appt on 4/17   No facility-administered encounter medications on file as of 06/13/2023.    Recent Results (from the past 2160 hours)  POCT glycosylated hemoglobin (Hb A1C)     Status: Abnormal   Collection Time: 06/11/23  3:25  PM  Result Value Ref Range   Hemoglobin A1C 6.6 (A) 4.0 - 5.6 %   HbA1c POC (<> result, manual entry)     HbA1c, POC (prediabetic range)     HbA1c, POC (controlled diabetic range)       Psychiatric Specialty Exam: Physical Exam  Review of Systems  Weight 140 lb (63.5 kg).There is no height or weight on file to calculate BMI.  General Appearance: Casual  Eye Contact:  Good  Speech:  Clear and Coherent and Normal Rate  Volume:  Normal  Mood:  Euthymic  Affect:  Appropriate  Thought  Process:  Goal Directed  Orientation:  Full (Time, Place, and Person)  Thought Content:  Logical  Suicidal Thoughts:  No  Homicidal Thoughts:  No  Memory:  Immediate;   Good Recent;   Good Remote;   Good  Judgement:  Good  Insight:  Good  Psychomotor Activity:  Normal  Concentration:  Concentration: Good and Attention Span: Good  Recall:  Good  Fund of Knowledge:  Good  Language:  Good  Akathisia:  No  Handed:  Right  AIMS (if indicated):     Assets:  Communication Skills Desire for Improvement Housing Resilience Social Support Talents/Skills Transportation  ADL's:  Intact  Cognition:  WNL  Sleep: Okay with trazodone       04/20/2019    2:52 PM 07/31/2018    9:51 AM 02/06/2018    9:40 AM 03/21/2017    1:48 PM 07/12/2016    8:35 AM  Depression screen PHQ 2/9  Decreased Interest 0 0 0 0 1  Down, Depressed, Hopeless 0 0 0 0 2  PHQ - 2 Score 0 0 0 0 3  Altered sleeping   0  2  Tired, decreased energy   0  3  Change in appetite   0  1  Feeling bad or failure about yourself    0  2  Trouble concentrating   0  2  Moving slowly or fidgety/restless   0  0  Suicidal thoughts   0  0  PHQ-9 Score   0  13    Assessment/Plan: Moderate episode of recurrent major depressive disorder (HCC) - Plan: buPROPion (WELLBUTRIN XL) 300 MG 24 hr tablet, lamoTRIgine (LAMICTAL) 150 MG tablet  Anxiety - Plan: lamoTRIgine (LAMICTAL) 150 MG tablet, traZODone (DESYREL) 100 MG tablet  Patient is stable on current medication.  Discussed anxiety related to country economy situation.  She is taking trazodone which is helping her sleep and anxiety at night.  No rash or any itching from Lamictal.  Continue Lamictal 150 mg 2 times a day and Wellbutrin XL 300 mg daily and trazodone 100 mg as needed for insomnia.  Recommend to call us back if she is any question or any concern.  Reviewed blood work results.  Hemoglobin A1c stable.  Follow-up in 3 months   Follow Up Instructions:     I discussed the  assessment and treatment plan with the patient. The patient was provided an opportunity to ask questions and all were answered. The patient agreed with the plan and demonstrated an understanding of the instructions.   The patient was advised to call back or seek an in-person evaluation if the symptoms worsen or if the condition fails to improve as anticipated.    Collaboration of Care: Other provider involved in patient's care AEB notes are available in epic to review  Patient/Guardian was advised Release of Information must be obtained prior to  any record release in order to collaborate their care with an outside provider. Patient/Guardian was advised if they have not already done so to contact the registration department to sign all necessary forms in order for us  to release information regarding their care.   Consent: Patient/Guardian gives verbal consent for treatment and assignment of benefits for services provided during this visit. Patient/Guardian expressed understanding and agreed to proceed.     Total encounter time 17 minutes which includes face-to-face time, chart reviewed, care coordination, order entry and documentation during this encounter.   Note: This document was prepared by [employer] voice dictation technology and any errors that results from this process are unintentional.    Arturo Late, MD 06/13/2023

## 2023-07-04 ENCOUNTER — Other Ambulatory Visit (HOSPITAL_COMMUNITY): Payer: Self-pay | Admitting: Psychiatry

## 2023-07-04 DIAGNOSIS — F419 Anxiety disorder, unspecified: Secondary | ICD-10-CM

## 2023-08-05 LAB — HM DIABETES EYE EXAM

## 2023-08-10 ENCOUNTER — Other Ambulatory Visit (HOSPITAL_COMMUNITY): Payer: Self-pay | Admitting: Psychiatry

## 2023-08-10 DIAGNOSIS — F331 Major depressive disorder, recurrent, moderate: Secondary | ICD-10-CM

## 2023-08-12 ENCOUNTER — Encounter: Payer: Self-pay | Admitting: Student

## 2023-10-18 ENCOUNTER — Other Ambulatory Visit (HOSPITAL_COMMUNITY): Payer: Self-pay | Admitting: Psychiatry

## 2023-10-18 DIAGNOSIS — F419 Anxiety disorder, unspecified: Secondary | ICD-10-CM

## 2023-10-22 ENCOUNTER — Other Ambulatory Visit (HOSPITAL_COMMUNITY): Payer: Self-pay | Admitting: Psychiatry

## 2023-10-22 DIAGNOSIS — F419 Anxiety disorder, unspecified: Secondary | ICD-10-CM

## 2023-11-26 ENCOUNTER — Telehealth (HOSPITAL_BASED_OUTPATIENT_CLINIC_OR_DEPARTMENT_OTHER): Admitting: Psychiatry

## 2023-11-26 ENCOUNTER — Encounter (HOSPITAL_COMMUNITY): Payer: Self-pay | Admitting: Psychiatry

## 2023-11-26 VITALS — Wt 140.0 lb

## 2023-11-26 DIAGNOSIS — F419 Anxiety disorder, unspecified: Secondary | ICD-10-CM | POA: Diagnosis not present

## 2023-11-26 DIAGNOSIS — F331 Major depressive disorder, recurrent, moderate: Secondary | ICD-10-CM | POA: Diagnosis not present

## 2023-11-26 DIAGNOSIS — F5102 Adjustment insomnia: Secondary | ICD-10-CM | POA: Diagnosis not present

## 2023-11-26 DIAGNOSIS — F4321 Adjustment disorder with depressed mood: Secondary | ICD-10-CM | POA: Diagnosis not present

## 2023-11-26 MED ORDER — BUPROPION HCL ER (XL) 300 MG PO TB24: 300.0000 mg | ORAL_TABLET | Freq: Every day | ORAL | 0 refills | Status: DC

## 2023-11-26 MED ORDER — LAMOTRIGINE 150 MG PO TABS: 300.0000 mg | ORAL_TABLET | Freq: Every day | ORAL | 0 refills | Status: DC

## 2023-11-26 MED ORDER — TRAZODONE HCL 100 MG PO TABS: 100.0000 mg | ORAL_TABLET | Freq: Every evening | ORAL | 1 refills | Status: DC | PRN

## 2023-11-26 NOTE — Progress Notes (Signed)
 [facility] MD Virtual Progress Note   Patient Location: Work Provider Location: Home Office  I connect with patient by video and verified that I am speaking with correct person by using two identifiers. I discussed the limitations of evaluation and management by telemedicine and the availability of in person appointments. I also discussed with the patient that there may be a patient responsible charge related to this service. The patient expressed understanding and agreed to proceed.  Sierra Schneider [id number] 38 y.o.  11/26/2023 10:59 AM  History of Present Illness:  Patient is evaluated by video session.  She apologized missing last appointment.  She was last seen in April.  Patient told her 66 year old brother overdose on June 01, 2025and died.  Patient told this news is devastating for the family.  Patient told that she was very close to her brother and usually talks every day and now she feels guilty about his death.  She did talk to him 1 day prior to overdose but past few days she was not able to talk to him.  Patient told biopsy is in process and it may take some time to get more information.  Police is investigating as there are multiple overdose around same time within a few miles of radius.  Patient told mother and father are not doing very well and she is trying to be as supportive as she can.  Patient told her 69 year old son is very close to his nephew and patient and he is not doing very well but patient was able to get grief counseling for her son and her nephew.  Patient has not started grief counseling but will consider in the future once autopsy results are available.  She admitted crying spells, poor sleep, racing thoughts.  She is trying to help her parents and son.  She is not sleeping well as used to take trazodone  but she is completely out.  Usually trazodone  keep her sleep all night.  She admitted ruminative thoughts but denies any suicidal thoughts or homicidal  thoughts.  She is frustrated, angry and guilt as she was very close to her brother and upset that brother did not reach out to patient.  She reported her job is going well and busy which helps.  She denies any active or passive suicidal thoughts or homicidal thoughts.  She endorsed anxiety and nervousness and fewer panic attacks but manageable.  Her appetite is okay.  Her weight is stable.  She has appointment coming up to see her PCP in October for blood work.  She has no rash, itching tremors or shakes.  She is taking the medication as prescribed.  She denies drinking or using any illegal substances.   Past Psychiatric History: H/O depression after her daughter born.  Took Prozac, hydroxyzine , increased Wellbutrin , Effexor , Zoloft  and Cymbalta . No H/O suicidal attempt or inpatient, mania and psychosis.    Past Medical History:  Diagnosis Date   Depression    Diabetes mellitus 08/2009   late onset type 1    Outpatient Encounter Medications as of 11/26/2023  Medication Sig   buPROPion  (WELLBUTRIN  XL) 300 MG 24 hr tablet Take 1 tablet (300 mg total) by mouth daily.   Continuous Glucose Sensor (DEXCOM G7 SENSOR) MISC 1 Device by Does not apply route as directed. Every 10 days   fluticasone  (FLONASE ) 50 MCG/ACT nasal spray Place 1-2 sprays into both nostrils daily.   glucose blood (ONETOUCH VERIO) test strip Check sugar 4 times daily   Insulin   Disposable Pump (OMNIPOD 5 G7 PODS, GEN 5,) MISC 1 Device by Does not apply route every other day.   insulin  lispro (HUMALOG ) 100 UNIT/ML injection Max daily 60 units   lamoTRIgine  (LAMICTAL ) 150 MG tablet Take 2 tablets (300 mg total) by mouth daily.   LORazepam  (ATIVAN ) 0.5 MG tablet TAKE 1 TABLET BY MOUTH EVERY DAY AS NEEDED FOR ANXIETY   ondansetron  (ZOFRAN -ODT) 4 MG disintegrating tablet Take 1 tablet (4 mg total) by mouth every 8 (eight) hours as needed for nausea or vomiting.   rosuvastatin  (CRESTOR ) 20 MG tablet Take 1 tablet (20 mg total) by mouth  daily.   traZODone  (DESYREL ) 100 MG tablet Take 1 tablet (100 mg total) by mouth at bedtime as needed.   No facility-administered encounter medications on file as of 11/26/2023.    No results found for this or any previous visit (from the past 2160 hours).   Psychiatric Specialty Exam: Physical Exam  Review of Systems  Weight 140 lb (63.5 kg).There is no height or weight on file to calculate BMI.  General Appearance: Casual  Eye Contact:  Good  Speech:  Slow  Volume:  Decreased  Mood:  Dysphoric, tearful  Affect:  Congruent  Thought Process:  Descriptions of Associations: Intact  Orientation:  Full (Time, Place, and Person)  Thought Content:  Rumination  Suicidal Thoughts:  No  Homicidal Thoughts:  No  Memory:  Immediate;   Good Recent;   Good Remote;   Good  Judgement:  Good  Insight:  Present  Psychomotor Activity:  Decreased  Concentration:  Concentration: Good and Attention Span: Good  Recall:  Good  Fund of Knowledge:  Good  Language:  Good  Akathisia:  No  Handed:  Right  AIMS (if indicated):     Assets:  Communication Skills Desire for Improvement Housing Social Support Talents/Skills Transportation  ADL's:  Intact  Cognition:  WNL  Sleep:  fair       04/20/2019    2:52 PM 07/31/2018    9:51 AM 02/06/2018    9:40 AM 03/21/2017    1:48 PM 07/12/2016    8:35 AM  Depression screen PHQ 2/9  Decreased Interest 0 0 0 0 1  Down, Depressed, Hopeless 0 0 0 0 2  PHQ - 2 Score 0 0 0 0 3  Altered sleeping   0  2  Tired, decreased energy   0  3  Change in appetite   0  1  Feeling bad or failure about yourself    0  2  Trouble concentrating   0  2  Moving slowly or fidgety/restless   0  0  Suicidal thoughts   0  0   PHQ-9 Score   0  13     Data saved with a previous flowsheet row definition    Assessment/Plan: Moderate episode of recurrent major depressive disorder (HCC) - Plan: buPROPion  (WELLBUTRIN  XL) 300 MG 24 hr tablet, lamoTRIgine  (LAMICTAL ) 150 MG  tablet  Anxiety - Plan: lamoTRIgine  (LAMICTAL ) 150 MG tablet, traZODone  (DESYREL ) 100 MG tablet  Grief  Adjustment insomnia  Patient is 38 year old Caucasian, employed female with history of diabetes, major depressive disorder, anxiety and now going through grief and insomnia.  Discussed loss of 64 year old brother who took overdose in May.  Discussed to consider grief counseling which patient will consider when she has autopsy reports available.  She is trying to help her family members and parents.  She feels the guilt and anger because brother  did not reach out to her which he usually done in the past.  I encourage to reach out to us  if interested in therapy and patient promised.  She like to get the refills especially trazodone  because she is not sleeping very well.  She has no rash or any itching.  Will resume trazodone  100 mg to take as needed for insomnia and continue Wellbutrin  XL 300 mg in the morning and Lamictal  300 mg daily.  Discuss safety concerns at any time having active suicidal thoughts or homicidal thought that she need to call 911 or go to local emergency room.  Patient has appointment coming up to see her PCP for blood work.  Follow-up in 3 months unless patient requires sooner appointment.   Follow Up Instructions:     I discussed the assessment and treatment plan with the patient. The patient was provided an opportunity to ask questions and all were answered. The patient agreed with the plan and demonstrated an understanding of the instructions.   The patient was advised to call back or seek an in-person evaluation if the symptoms worsen or if the condition fails to improve as anticipated.    Collaboration of Care: Other provider involved in patient's care AEB notes are available in epic to review  Patient/Guardian was advised Release of Information must be obtained prior to any record release in order to collaborate their care with an outside provider. Patient/Guardian  was advised if they have not already done so to contact the registration department to sign all necessary forms in order for us  to release information regarding their care.   Consent: Patient/Guardian gives verbal consent for treatment and assignment of benefits for services provided during this visit. Patient/Guardian expressed understanding and agreed to proceed.     Total encounter time 31 minutes which includes face-to-face time, chart reviewed, care coordination, order entry and documentation during this encounter.   Note: This document was prepared by [employer] voice dictation technology and any errors that results from this process are unintentional.    Leni ONEIDA Client, MD 11/26/2023

## 2023-12-13 ENCOUNTER — Ambulatory Visit: Admitting: Internal Medicine

## 2023-12-13 NOTE — Progress Notes (Deleted)
 Name: Sierra Schneider  Age/ Sex: 38 y.o., female   MRN/ DOB: [id number], 1985/03/10     PCP: Cleotilde Perkins, DO   Reason for Endocrinology Evaluation: Type 1 Diabetes Mellitus  Initial Endocrine Consultative Visit: 01/02/2013    PATIENT IDENTIFIER: Sierra Schneider is a 38 y.o. female with a past medical history of T1DM and dyslipidemia . The patient has followed with Endocrinology clinic since 01/02/2013 for consultative assistance with management of her diabetes.  DIABETIC HISTORY:  Ms. Shands was diagnosed with DM in 2011, has been on an insulin  pump since ~ 2012. Her hemoglobin A1c has ranged from 6.1% in 2023, peaking at 8.5% in 2017.  Transitioned from Dr. Von 07/2021   SUBJECTIVE:   During the last visit (06/11/2023):  A1c 6.6 %   Today (12/13/2023): Sierra Schneider  is here for a follow up on diabetes management. She checks her blood sugars multiple  times daily, through CGM. The patient has had not hypoglycemic episodes since the last clinic visit.  Denies  nausea or vomiting  Denies constipation or diarrhea     This patient with type 1 diabetes is treated with Omnipod  (insulin  pump). During the visit the pump basal and bolus doses were reviewed including carb/insulin  rations and supplemental doses. The clinical list was updated. The glucose meter download was reviewed in detail to determine if the current pump settings are providing the best glycemic control without excessive hypoglycemia.  Pump and meter download:     Pump   Omnipod   Settings   Insulin  type   Humalog     Basal rate       0000 1.15  u/h    0700 1.4   1000 0.6   1200 1.3   1800 0.9   1900 1.0      I:C ratio       0000  1:6   1200 1:8   1700 1:12          Sensitivity       0000  40      Goal       0000  110           Type & Model of Pump: Omnipod 5 Insulin  Type: Currently using Humalog  .  There is no height or weight on file to calculate BMI.  PUMP STATISTICS: Average BG:  178 Average Daily Carbs (g): 112.3 Average Total Daily Insulin : 40.5 Average Daily Basal: 25.3 units (62%) Average Daily Bolus: 15.2 units (38%)   CONTINUOUS GLUCOSE MONITORING RECORD INTERPRETATION    Dates of Recording: 4/2-4/15/2025  Sensor description:dexcom  Results statistics:   CGM use % of time 92.8  Average and SD 178/71  Time in range 60  %  % Time Above 180 24  % Time above 250 15  % Time Below target 1   Glycemic patterns summary: BGs are optimal overnight and fluctuate during the day Hyperglycemic episodes  postprandial   Hypoglycemic episodes occurred post bolus  Overnight periods: optimal     HOME DIABETES REGIMEN:  Humalog   Rosuvastatin  20 mg daily    Statin: yes ACE-I/ARB: no Prior Diabetic Education: yes     DIABETIC COMPLICATIONS: Microvascular complications:   Denies: neuropathy, retinopathy, CKD Last Eye Exam: Completed 2024  Macrovascular complications:   Denies: CAD, CVA, PVD   HISTORY:  Past Medical History:  Past Medical History:  Diagnosis Date   Depression    Diabetes mellitus 08/2009   late onset type 1  Past Surgical History:  Past Surgical History:  Procedure Laterality Date   CESAREAN SECTION     ESOPHAGOGASTRODUODENOSCOPY Left 10/14/2012   Procedure: ESOPHAGOGASTRODUODENOSCOPY (EGD);  Surgeon: Sierra Schneider Celestia Mickey., MD;  Location: [facility] ENDOSCOPY;  Service: Endoscopy;  Laterality: Left;   WISDOM TOOTH EXTRACTION     Social History:  reports that she quit smoking about 9 years ago. Her smoking use included cigarettes. She has never used smokeless tobacco. She reports current alcohol use. She reports that she does not use drugs. Family History:  Family History  Problem Relation Age of Onset   Depression Mother    Hypertension Father    Hyperlipidemia Father    Kidney disease Paternal Grandmother    Diabetes Maternal Grandfather    ADD / ADHD Brother    Depression Brother    Drug abuse Brother    Depression  Maternal Aunt    Depression Paternal Aunt    Psychosis Cousin    Heart disease Neg Hx      HOME MEDICATIONS: Allergies as of 12/13/2023   No Known Allergies      Medication List        Accurate as of December 13, 2023 12:55 PM. If you have any questions, ask your nurse or doctor.          buPROPion  300 MG 24 hr tablet Commonly known as: WELLBUTRIN  XL Take 1 tablet (300 mg total) by mouth daily.   Dexcom G7 Sensor Misc 1 Device by Does not apply route as directed. Every 10 days   fluticasone  50 MCG/ACT nasal spray Commonly known as: FLONASE  Place 1-2 sprays into both nostrils daily.   insulin  lispro 100 UNIT/ML injection Commonly known as: HumaLOG  Max daily 60 units   lamoTRIgine  150 MG tablet Commonly known as: LAMICTAL  Take 2 tablets (300 mg total) by mouth daily.   LORazepam  0.5 MG tablet Commonly known as: ATIVAN  TAKE 1 TABLET BY MOUTH EVERY DAY AS NEEDED FOR ANXIETY   Omnipod 5 G7 Pods (Gen 5) Misc 1 Device by Does not apply route every other day.   ondansetron  4 MG disintegrating tablet Commonly known as: ZOFRAN -ODT Take 1 tablet (4 mg total) by mouth every 8 (eight) hours as needed for nausea or vomiting.   OneTouch Verio test strip Generic drug: glucose blood Check sugar 4 times daily   rosuvastatin  20 MG tablet Commonly known as: CRESTOR  Take 1 tablet (20 mg total) by mouth daily.   traZODone  100 MG tablet Commonly known as: DESYREL  Take 1 tablet (100 mg total) by mouth at bedtime as needed.         OBJECTIVE:   Vital Signs: There were no vitals taken for this visit.  Wt Readings from Last 3 Encounters:  06/11/23 140 lb (63.5 kg)  12/25/22 139 lb (63 kg)  06/18/22 143 lb (64.9 kg)     Exam: General: Pt appears well and is in NAD  Lungs: Clear with good BS bilat   Heart: RRR   Abdomen: soft, nontender  Extremities: No pretibial edema.   Neuro: MS is good with appropriate affect, pt is alert and Ox3    DM Foot Exam  12/25/2022  The skin of the feet is intact without sores or ulcerations. The pedal pulses are 2+ on right and 2+ on left. The sensation is intact to a screening 5.07, 10 gram monofilament bilaterally   DATA REVIEWED:  Lab Results  Component Value Date   HGBA1C 6.6 (A) 06/11/2023   HGBA1C 6.3 (  A) 12/25/2022   HGBA1C 6.6 (A) 06/18/2022    Latest Reference Range & Units 12/25/22 15:21  Sodium 135 - 145 mEq/L 140  Potassium 3.5 - 5.1 mEq/L 4.2  Chloride 96 - 112 mEq/L 103  CO2 19 - 32 mEq/L 28  Glucose 70 - 99 mg/dL 879 (H)  BUN 6 - 23 mg/dL 5 (L)  Creatinine 9.59 - 1.20 mg/dL 9.35  Calcium  8.4 - 10.5 mg/dL 9.1  GFR >39.99 mL/min 111.83  Total CHOL/HDL Ratio  2  Cholesterol 0 - 200 mg/dL 877  HDL Cholesterol >60.99 mg/dL 39.59  LDL (calc) 0 - 99 mg/dL 46  MICROALB/CREAT RATIO 0.0 - 30.0 mg/g 0.8  NonHDL  62.03  Triglycerides 0.0 - 149.0 mg/dL 17.9  VLDL 0.0 - 59.9 mg/dL 83.5    Latest Reference Range & Units 12/25/22 15:21  TSH 0.35 - 5.50 uIU/mL 0.92  T4,Free(Direct) 0.60 - 1.60 ng/dL 9.05    Latest Reference Range & Units 12/25/22 15:21  Creatinine,U mg/dL 15.0  Microalb, Ur 0.0 - 1.9 mg/dL <9.2  MICROALB/CREAT RATIO 0.0 - 30.0 mg/g 0.8      ASSESSMENT / PLAN / RECOMMENDATIONS:   1) Type 1 Diabetes Mellitus, optimally controlled, Without complications - Most recent A1c of 6.6%. Goal A1c < 7.0 %.    -A1c remain at goal  - In reviewing CGM/pump download  the pt has been noted with optimal BG's overnight, BGs fluctuate during the day due to insulin -carbohydrate mismatch -Encourage continuous carbohydrate counting and entering into the pump before the meal -No changes to the pump settings at this time - Up-to-date on labs  MEDICATIONS: Humalog     Pump   Omnipod   Settings   Insulin  type   Humalog     Basal rate       0000 1.15  u/h    0700 1.4   1000 0.6   1200 1.3   1800 0.9   1900 1.0      I:C ratio       0000  1:6   1200 1:8   1700 1:12           Sensitivity       0000  40      Goal       0000  110           EDUCATION / INSTRUCTIONS: BG monitoring instructions: Patient is instructed to check her blood sugars 4 times a day, before meals and bedtime. Call [facility] if: BG persistently < 70  I reviewed the Rule of 15 for the treatment of hypoglycemia in detail with the patient. Literature supplied.   2) Diabetic complications:  Eye: Does not have known diabetic retinopathy.  Neuro/ Feet: Does not have known diabetic peripheral neuropathy .  Renal: Patient does not have known baseline CKD. She   is not on an ACEI/ARB at present.   3) Dyslipidemia:  -Lipid panel  at goal    Medication Continue rosuvastatin  20 mg daily    F/U in 6 months      Signed electronically by: Stefano Redgie Butts, MD  [facility]  [facility] [address] [place] [place], [place] [address] Phone: [phone] FAX: [phone]   CC: Cleotilde Perkins, DO [address] [place] [address] Phone: [phone]  Fax: [phone]  Return to Endocrinology clinic as below: Future Appointments  Date Time Provider Department Center  12/13/2023  3:00 PM Charolette Bultman, Donell Redgie, MD [id number] None  02/11/2024  11:20 AM Arfeen, Leni DASEN, MD [facility] None

## 2024-02-11 ENCOUNTER — Encounter (HOSPITAL_COMMUNITY): Payer: Self-pay

## 2024-02-11 ENCOUNTER — Telehealth (HOSPITAL_COMMUNITY): Payer: Self-pay | Admitting: Psychiatry

## 2024-02-11 NOTE — Progress Notes (Signed)
 Patient is no-show on video platform.  I called the left a message to contact our office to schedule appointment.

## 2024-02-21 ENCOUNTER — Other Ambulatory Visit (HOSPITAL_COMMUNITY): Payer: Self-pay | Admitting: Psychiatry

## 2024-02-21 DIAGNOSIS — F419 Anxiety disorder, unspecified: Secondary | ICD-10-CM

## 2024-03-10 ENCOUNTER — Ambulatory Visit: Admitting: Internal Medicine

## 2024-03-10 ENCOUNTER — Other Ambulatory Visit

## 2024-03-10 ENCOUNTER — Encounter: Payer: Self-pay | Admitting: Internal Medicine

## 2024-03-10 VITALS — BP 126/80 | Ht 63.0 in | Wt 148.0 lb

## 2024-03-10 DIAGNOSIS — E109 Type 1 diabetes mellitus without complications: Secondary | ICD-10-CM | POA: Diagnosis not present

## 2024-03-10 LAB — POCT GLYCOSYLATED HEMOGLOBIN (HGB A1C): Hemoglobin A1C: 6.9 % — AB (ref 4.0–5.6)

## 2024-03-10 MED ORDER — INSULIN LISPRO 100 UNIT/ML IJ SOLN: INTRAMUSCULAR | 3 refills | Status: AC

## 2024-03-10 MED ORDER — OMNIPOD 5 G7 PODS (GEN 5) MISC: 1.0000 | 3 refills | Status: AC

## 2024-03-10 MED ORDER — DEXCOM G7 SENSOR MISC: 1.0000 | 3 refills | Status: AC

## 2024-03-10 NOTE — Patient Instructions (Signed)

## 2024-03-10 NOTE — Progress Notes (Unsigned)
 "   Name: Sierra Schneider  Age/ Sex: 39 y.o., female   MRN/ DOB: [id number], Aug 28, 1985     PCP: Cleotilde Perkins, DO   Reason for Endocrinology Evaluation: Type 1 Diabetes Mellitus  Initial Endocrine Consultative Visit: 01/02/2013    PATIENT IDENTIFIER: [name] is a 39 y.o. female with a past medical history of T1DM and dyslipidemia . The patient has followed with Endocrinology clinic since 01/02/2013 for consultative assistance with management of her diabetes.  DIABETIC HISTORY:  Sierra Schneider was diagnosed with DM in 2011, has been on an insulin  pump since ~ 2012. Her hemoglobin A1c has ranged from 6.1% in 2023, peaking at 8.5% in 2017.  Transitioned from Dr. Von 07/2021   SUBJECTIVE:   During the last visit (06/11/2023):  A1c 6.3 %   Today (03/10/2024): Sierra Schneider  is here for a follow up on diabetes management. She checks her blood sugars multiple  times daily, through CGM. The patient has had hypoglycemic episodes since the last clinic visit.   No nausea  No constipation  No heartburn  NO tingling of the feet     This patient with type 1 diabetes is treated with Omnipod  (insulin  pump). During the visit the pump basal and bolus doses were reviewed including carb/insulin  rations and supplemental doses. The clinical list was updated. The glucose meter download was reviewed in detail to determine if the current pump settings are providing the best glycemic control without excessive hypoglycemia.  Pump/CGM download:     Pump   Omnipod   Settings   Insulin  type   Humalog     Basal rate       0000 1.15  u/h    0700 1.4   1000 0.6   1200 1.3   1800 0.9   1900 1.0      I:C ratio       0000  1:6   1200 1:8   1700 1:12          Sensitivity       0000  40      Goal       0000  110           Type & Model of Pump: Omnipod 5 Insulin  Type: Currently using Humalog  .  Body mass index is 26.22 kg/m.  PUMP STATISTICS:     HOME DIABETES REGIMEN:   Humalog   Rosuvastatin  20 mg daily    Statin: yes ACE-I/ARB: no Prior Diabetic Education: yes     DIABETIC COMPLICATIONS: Microvascular complications:   Denies: neuropathy, retinopathy, CKD Last Eye Exam: Completed [facility]   Macrovascular complications:   Denies: CAD, CVA, PVD   HISTORY:  Past Medical History:  Past Medical History:  Diagnosis Date   Depression    Diabetes mellitus 08/2009   late onset type 1   Past Surgical History:  Past Surgical History:  Procedure Laterality Date   CESAREAN SECTION     ESOPHAGOGASTRODUODENOSCOPY Left 10/14/2012   Procedure: ESOPHAGOGASTRODUODENOSCOPY (EGD);  Surgeon: Lynwood LITTIE Celestia Mickey., MD;  Location: [facility] END[facility]COPY;  Service: Endoscopy;  Laterality: Left;   WISDOM TOOTH EXTRACTION     Social History:  reports that she quit smoking about 10 years ago. Her smoking use included cigarettes. She has never used smokeless tobacco. She reports current alcohol use. She reports that she does not use drugs. Family History:  Family History  Problem Relation Age of Onset   Depression Mother    Hypertension  Father    Hyperlipidemia Father    Kidney disease Paternal Grandmother    Diabetes Maternal Grandfather    ADD / ADHD Brother    Depression Brother    Drug abuse Brother    Depression Maternal Aunt    Depression Paternal Aunt    Psychosis Cousin    Heart disease Neg Hx      HOME MEDICATIONS: Allergies as of 03/10/2024   No Known Allergies      Medication List        Accurate as of March 10, 2024 12:23 PM. If you have any questions, ask your nurse or doctor.          buPROPion  300 MG 24 hr tablet Commonly known as: WELLBUTRIN  XL Take 1 tablet (300 mg total) by mouth daily.   Dexcom G7 Sensor Misc 1 Device by Does not apply route as directed. Every 10 days   fluticasone  50 MCG/ACT nasal spray Commonly known as: FLONASE  Place 1-2 sprays into both nostrils daily.   insulin  lispro 100 UNIT/ML  injection Commonly known as: HumaLOG  Max daily 60 units   lamoTRIgine  150 MG tablet Commonly known as: LAMICTAL  Take 2 tablets (300 mg total) by mouth daily.   LORazepam  0.5 MG tablet Commonly known as: ATIVAN  TAKE 1 TABLET BY MOUTH EVERY DAY AS NEEDED FOR ANXIETY   Omnipod 5 G7 Pods (Gen 5) Misc 1 Device by Does not apply route every other day.   ondansetron  4 MG disintegrating tablet Commonly known as: ZOFRAN -ODT Take 1 tablet (4 mg total) by mouth every 8 (eight) hours as needed for nausea or vomiting.   OneTouch Verio test strip Generic drug: glucose blood Check sugar 4 times daily   rosuvastatin  20 MG tablet Commonly known as: CRESTOR  Take 1 tablet (20 mg total) by mouth daily.   traZODone  100 MG tablet Commonly known as: DESYREL  Take 1 tablet (100 mg total) by mouth at bedtime as needed.         OBJECTIVE:   Vital Signs: BP 126/80   Ht 5' 3 (1.6 m)   Wt 148 lb (67.1 kg)   BMI 26.22 kg/m   Wt Readings from Last 3 Encounters:  03/10/24 148 lb (67.1 kg)  06/11/23 140 lb (63.5 kg)  12/25/22 139 lb (63 kg)     Exam: General: Pt appears well and is in NAD  Lungs: Clear with good BS bilat   Heart: RRR   Abdomen: soft, nontender  Extremities: No pretibial edema.   Neuro: MS is good with appropriate affect, pt is alert and Ox3    DM Foot Exam 03/10/2024  The skin of the feet is intact without sores or ulcerations. The pedal pulses are 2+ on right and 2+ on left. The sensation is intact to a screening 5.07, 10 gram monofilament bilaterally   DATA REVIEWED:  Lab Results  Component Value Date   HGBA1C 6.9 (A) 03/10/2024   HGBA1C 6.6 (A) 06/11/2023   HGBA1C 6.3 (A) 12/25/2022   ***     ASSESSMENT / PLAN / RECOMMENDATIONS:   1) Type 1 Diabetes Mellitus, optimally controlled, Without complications - Most recent A1c of 6.9%. Goal A1c < 7.0 %.    -A1c remain at goal  - In reviewing CGM/pump download  the pt has been noted with optimal BG's  overnight, BGs fluctuate during the day due to insulin -carbohydrate mismatch -Encourage continuous carbohydrate counting and entering into the pump before the meal -No changes to the pump settings at this  time - Up-to-date on labs  MEDICATIONS: Humalog     Pump   Omnipod   Settings   Insulin  type   Humalog     Basal rate       0000 1.15  u/h    0700 1.35   1000 0.60   1200 1.25   1800 0.9   1900 1.0      I:C ratio       0000  1:6   1200 1:6   1700 1:10          Sensitivity       0000  35      Goal       0000  110           EDUCATION / INSTRUCTIONS: BG monitoring instructions: Patient is instructed to check her blood sugars 4 times a day, before meals and bedtime. Call [facility] if: BG persistently < 70  I reviewed the Rule of 15 for the treatment of hypoglycemia in detail with the patient. Literature supplied.   2) Diabetic complications:  Eye: Does not have known diabetic retinopathy.  Neuro/ Feet: Does not have known diabetic peripheral neuropathy .  Renal: Patient does not have known baseline CKD. She   is not on an ACEI/ARB at present.   3) Dyslipidemia:  -Lipid panel  at goal    Medication Continue rosuvastatin  20 mg daily    F/U in 6 months      Signed electronically by: Stefano Redgie Butts, MD  [facility] End[facility]  [facility] [address] [place] [place], [place] [address] Phone: [phone] FAX: [phone]   CC: Cleotilde Perkins, DO [id number] [place] [address] Phone: [phone]  Fax: [phone]  Return to Endocrinology clinic as below: Future Appointments  Date Time Provider Department Center  03/12/2024  9:00 AM Arfeen, Leni DASEN, MD [facility] None    "

## 2024-03-11 ENCOUNTER — Ambulatory Visit: Payer: Self-pay | Admitting: Internal Medicine

## 2024-03-11 LAB — COMPREHENSIVE METABOLIC PANEL WITH GFR
AG Ratio: 2 (calc) (ref 1.0–2.5)
ALT: 12 U/L (ref 6–29)
AST: 16 U/L (ref 10–30)
Albumin: 4.7 g/dL (ref 3.6–5.1)
Alkaline phosphatase (APISO): 80 U/L (ref 31–125)
BUN/Creatinine Ratio: 10 (calc) (ref 6–22)
BUN: 6 mg/dL — ABNORMAL LOW (ref 7–25)
CO2: 28 mmol/L (ref 20–32)
Calcium: 9.3 mg/dL (ref 8.6–10.2)
Chloride: 101 mmol/L (ref 98–110)
Creat: 0.62 mg/dL (ref 0.50–0.97)
Globulin: 2.3 g/dL (ref 1.9–3.7)
Glucose, Bld: 258 mg/dL — ABNORMAL HIGH (ref 65–139)
Potassium: 4 mmol/L (ref 3.5–5.3)
Sodium: 139 mmol/L (ref 135–146)
Total Bilirubin: 0.4 mg/dL (ref 0.2–1.2)
Total Protein: 7 g/dL (ref 6.1–8.1)
eGFR: 116 mL/min/1.73m2

## 2024-03-11 LAB — LIPID PANEL
Cholesterol: 141 mg/dL
HDL: 68 mg/dL
LDL Cholesterol (Calc): 58 mg/dL
Non-HDL Cholesterol (Calc): 73 mg/dL
Total CHOL/HDL Ratio: 2.1 (calc)
Triglycerides: 66 mg/dL

## 2024-03-11 LAB — MICROALBUMIN / CREATININE URINE RATIO
Creatinine, Urine: 95 mg/dL (ref 20–275)
Microalb Creat Ratio: 9 mg/g{creat}
Microalb, Ur: 0.9 mg/dL

## 2024-03-11 LAB — TSH: TSH: 1.27 m[IU]/L

## 2024-03-11 MED ORDER — ROSUVASTATIN CALCIUM 20 MG PO TABS: 20.0000 mg | ORAL_TABLET | Freq: Every day | ORAL | 3 refills | Status: AC

## 2024-03-12 ENCOUNTER — Telehealth (HOSPITAL_BASED_OUTPATIENT_CLINIC_OR_DEPARTMENT_OTHER): Admitting: Psychiatry

## 2024-03-12 ENCOUNTER — Encounter (HOSPITAL_COMMUNITY): Payer: Self-pay | Admitting: Psychiatry

## 2024-03-12 VITALS — Wt 148.0 lb

## 2024-03-12 DIAGNOSIS — F4321 Adjustment disorder with depressed mood: Secondary | ICD-10-CM | POA: Diagnosis not present

## 2024-03-12 DIAGNOSIS — F331 Major depressive disorder, recurrent, moderate: Secondary | ICD-10-CM | POA: Diagnosis not present

## 2024-03-12 DIAGNOSIS — F419 Anxiety disorder, unspecified: Secondary | ICD-10-CM

## 2024-03-12 MED ORDER — TRAZODONE HCL 100 MG PO TABS: 100.0000 mg | ORAL_TABLET | Freq: Every evening | ORAL | 1 refills | Status: AC | PRN

## 2024-03-12 MED ORDER — BUPROPION HCL ER (XL) 300 MG PO TB24: 300.0000 mg | ORAL_TABLET | Freq: Every day | ORAL | 0 refills | Status: AC

## 2024-03-12 MED ORDER — LAMOTRIGINE 150 MG PO TABS: 300.0000 mg | ORAL_TABLET | Freq: Every day | ORAL | 0 refills | Status: AC

## 2024-03-12 NOTE — Progress Notes (Signed)
 " [facility] MD Virtual Progress Note   Patient Location: Work Provider Location: Home Office  I connect with patient by video and verified that I am speaking with correct person by using two identifiers. I discussed the limitations of evaluation and management by telemedicine and the availability of in person appointments. I also discussed with the patient that there may be a patient responsible charge related to this service. The patient expressed understanding and agreed to proceed.  Sierra Schneider [id number] 39 y.o.  03/12/2024 9:16 AM  History of Present Illness:  Patient is evaluated by video session.  She is at work.  She apologized for missing her last appointment.  She reported things are okay but certainly it was a sad holidays.  It was tough without his brother who died last year.  Patient admitted going through grief but had a good support from family.  She is busy taking care of her family and parents.  Her daughter started playing volleyball and she is busy on the weekends.  She admitted sometime difficulty sleeping all night and takes melatonin.  She takes also trazodone  but does not want to take every night.  She reported nephew is okay.  They tried grief counseling but his therapist recommend to wait as nephew was not ready.  Patient denies any mania, psychosis, hallucination or any paranoia.  She is taking Lamictal  and denies any rash itching tremors or shakes.  Her job is going well.  She denies any active or passive suicidal thoughts.  Recently had a visit with endocrinologist.  Hemoglobin A1c 6.9.  Denies drinking or using any illegal substances.  She is taking medication as prescribed.  She denies drinking or using any illegal substances.   Past Psychiatric History: H/O depression after her daughter born.  Took Prozac, hydroxyzine , increased Wellbutrin , Effexor , Zoloft  and Cymbalta . No H/O suicidal attempt or inpatient, mania and psychosis.    Past Medical  History:  Diagnosis Date   Depression    Diabetes mellitus 08/2009   late onset type 1    Outpatient Encounter Medications as of 03/12/2024  Medication Sig   buPROPion  (WELLBUTRIN  XL) 300 MG 24 hr tablet Take 1 tablet (300 mg total) by mouth daily.   Continuous Glucose Sensor (DEXCOM G7 SENSOR) MISC 1 Device by Does not apply route as directed. Every 15 days   fluticasone  (FLONASE ) 50 MCG/ACT nasal spray Place 1-2 sprays into both nostrils daily.   glucose blood (ONETOUCH VERIO) test strip Check sugar 4 times daily   Insulin  Disposable Pump (OMNIPOD 5 G7 PODS, GEN 5,) MISC 1 Device by Does not apply route every other day.   insulin  lispro (HUMALOG ) 100 UNIT/ML injection Max daily 60 units   lamoTRIgine  (LAMICTAL ) 150 MG tablet Take 2 tablets (300 mg total) by mouth daily.   LORazepam  (ATIVAN ) 0.5 MG tablet TAKE 1 TABLET BY MOUTH EVERY DAY AS NEEDED FOR ANXIETY   ondansetron  (ZOFRAN -ODT) 4 MG disintegrating tablet Take 1 tablet (4 mg total) by mouth every 8 (eight) hours as needed for nausea or vomiting.   rosuvastatin  (CRESTOR ) 20 MG tablet Take 1 tablet (20 mg total) by mouth daily.   traZODone  (DESYREL ) 100 MG tablet Take 1 tablet (100 mg total) by mouth at bedtime as needed.   No facility-administered encounter medications on file as of 03/12/2024.    Recent Results (from the past 2160 hours)  POCT glycosylated hemoglobin (Hb A1C)     Status: Abnormal   Collection Time: 03/10/24 12:12 PM  Result Value Ref Range   Hemoglobin A1C 6.9 (A) 4.0 - 5.6 %   HbA1c POC (<> result, manual entry)     HbA1c, POC (prediabetic range)     HbA1c, POC (controlled diabetic range)    Comprehensive metabolic panel with GFR     Status: Abnormal   Collection Time: 03/10/24 12:40 PM  Result Value Ref Range   Glucose, Bld 258 (H) 65 - 139 mg/dL    Comment: .        Non-fasting reference interval .    BUN 6 (L) 7 - 25 mg/dL   Creat 9.37 9.49 - 9.02 mg/dL   eGFR 883 > OR = 60 fO/fpw/8.26f7    BUN/Creatinine Ratio 10 6 - 22 (calc)   Sodium 139 135 - 146 mmol/L   Potassium 4.0 3.5 - 5.3 mmol/L   Chloride 101 98 - 110 mmol/L   CO2 28 20 - 32 mmol/L   Calcium  9.3 8.6 - 10.2 mg/dL   Total Protein 7.0 6.1 - 8.1 g/dL   Albumin 4.7 3.6 - 5.1 g/dL   Globulin 2.3 1.9 - 3.7 g/dL (calc)   AG Ratio 2.0 1.0 - 2.5 (calc)   Total Bilirubin 0.4 0.2 - 1.2 mg/dL   Alkaline phosphatase (APISO) 80 31 - 125 U/L   AST 16 10 - 30 U/L   ALT 12 6 - 29 U/L  Lipid panel     Status: None   Collection Time: 03/10/24 12:40 PM  Result Value Ref Range   Cholesterol 141 <200 mg/dL   HDL 68 > OR = 50 mg/dL   Triglycerides 66 <849 mg/dL   LDL Cholesterol (Calc) 58 mg/dL (calc)    Comment: Reference range: <100 . Desirable range <100 mg/dL for primary prevention;   <70 mg/dL for patients with CHD or diabetic patients  with > or = 2 CHD risk factors. SABRA LDL-C is now calculated using the Martin-Hopkins  calculation, which is a validated novel method providing  better accuracy than the Friedewald equation in the  estimation of LDL-C.  Gladis APPLETHWAITE et al. SANDREA. 7986;689(80): 2061-2068  (http://education.[facility].com/faq/FAQ164)    Total CHOL/HDL Ratio 2.1 <5.0 (calc)   Non-HDL Cholesterol (Calc) 73 <869 mg/dL (calc)    Comment: For patients with diabetes plus 1 major ASCVD risk  factor, treating to a non-HDL-C goal of <100 mg/dL  (LDL-C of <29 mg/dL) is considered a therapeutic  option.   Microalbumin / creatinine urine ratio     Status: None   Collection Time: 03/10/24 12:40 PM  Result Value Ref Range   Creatinine, Urine 95 20 - 275 mg/dL   Microalb, Ur 0.9 mg/dL    Comment: Reference Range Not established    Microalb Creat Ratio 9 <30 mg/g creat    Comment: . The ADA defines abnormalities in albumin excretion as follows: SABRA Albuminuria Category        Result (mg/g creatinine) . Normal to Mildly increased   <30 Moderately increased         30-299  Severely increased           > OR =  300 . The ADA recommends that at least two of three specimens collected within a 3-6 month period be abnormal before considering a patient to be within a diagnostic category.   TSH     Status: None   Collection Time: 03/10/24 12:40 PM  Result Value Ref Range   TSH 1.27 mIU/L    Comment:  Reference Range .           > or = 20 Years  0.40-4.50 .                Pregnancy Ranges           First trimester    0.26-2.66           Second trimester   0.55-2.73           Third trimester    0.43-2.91      Psychiatric Specialty Exam: Physical Exam  Review of Systems  Weight 148 lb (67.1 kg).There is no height or weight on file to calculate BMI.  General Appearance: Casual  Eye Contact:  Good  Speech:  Normal Rate  Volume:  Decreased  Mood:  Dysphoric  Affect:  Congruent  Thought Process:  Descriptions of Associations: Intact  Orientation:  Full (Time, Place, and Person)  Thought Content:  Rumination  Suicidal Thoughts:  No  Homicidal Thoughts:  No  Memory:  Immediate;   Good Recent;   Good Remote;   Good  Judgement:  Good  Insight:  Present  Psychomotor Activity:  Decreased  Concentration:  Concentration: Good and Attention Span: Good  Recall:  Good  Fund of Knowledge:  Good  Language:  Good  Akathisia:  No  Handed:  Right  AIMS (if indicated):     Assets:  Communication Skills Desire for Improvement Housing Social Support Talents/Skills Transportation  ADL's:  Intact  Cognition:  WNL  Sleep:  fair       04/20/2019    2:52 PM 07/31/2018    9:51 AM 02/06/2018    9:40 AM 03/21/2017    1:48 PM 07/12/2016    8:35 AM  Depression screen PHQ 2/9  Decreased Interest 0 0 0 0 1  Down, Depressed, Hopeless 0 0 0 0 2  PHQ - 2 Score 0 0 0 0 3  Altered sleeping   0  2  Tired, decreased energy   0  3  Change in appetite   0  1  Feeling bad or failure about yourself    0  2  Trouble concentrating   0  2  Moving slowly or fidgety/restless   0  0  Suicidal thoughts    0  0   PHQ-9 Score   0   13      Data saved with a previous flowsheet row definition    Assessment/Plan: Moderate episode of recurrent major depressive disorder (HCC) - Plan: buPROPion  (WELLBUTRIN  XL) 300 MG 24 hr tablet, lamoTRIgine  (LAMICTAL ) 150 MG tablet  Anxiety - Plan: lamoTRIgine  (LAMICTAL ) 150 MG tablet, traZODone  (DESYREL ) 100 MG tablet  Grief  Patient is 39 year old Caucasian, employed female with history of diabetes, major depressive disorder anxiety and grief.  Discussed residual insomnia.  Recommend to take melatonin those nights when she cannot sleep and is still take trazodone  which works very well when she takes.  Discussed grief and patient realize she may needed counseling but at this time she is very busy taking care of the family members.  She does not want to change the medication.  Continue Wellbutrin  XL 300 mg in the morning, Lamictal  300 mg daily and trazodone  100 mg at bedtime.  Recommend to call back if she is any question or any concern.  I reviewed blood work results.  Hemoglobin A1c 6.9.  She had gained weight since the last visit which could be due to recent holidays but trying  to go back onto healthy lifestyle.  I encourage call back if any question otherwise follow-up in 3 months.   Follow Up Instructions:     I discussed the assessment and treatment plan with the patient. The patient was provided an opportunity to ask questions and all were answered. The patient agreed with the plan and demonstrated an understanding of the instructions.   The patient was advised to call back or seek an in-person evaluation if the symptoms worsen or if the condition fails to improve as anticipated.    Collaboration of Care: Other provider involved in patient's care AEB notes are available in epic to review  Patient/Guardian was advised Release of Information must be obtained prior to any record release in order to collaborate their care with an outside provider.  Patient/Guardian was advised if they have not already done so to contact the registration department to sign all necessary forms in order for us  to release information regarding their care.   Consent: Patient/Guardian gives verbal consent for treatment and assignment of benefits for services provided during this visit. Patient/Guardian expressed understanding and agreed to proceed.     Total encounter time 20 minutes which includes face-to-face time, chart reviewed, care coordination, order entry and documentation during this encounter.   Note: This document was prepared by [employer] voice dictation technology and any errors that results from this process are unintentional.    Leni ONEIDA Client, MD 03/12/2024   "

## 2024-06-12 ENCOUNTER — Telehealth (HOSPITAL_COMMUNITY): Admitting: Psychiatry

## 2024-09-11 ENCOUNTER — Ambulatory Visit: Admitting: Internal Medicine
